# Patient Record
Sex: Female | Born: 1975
Health system: Southern US, Community
[De-identification: ages and names within clinical notes are randomized; demographics above are authoritative.]

## PROBLEM LIST (undated history)

## (undated) DIAGNOSIS — K649 Unspecified hemorrhoids: Secondary | ICD-10-CM

## (undated) DIAGNOSIS — D6859 Other primary thrombophilia: Secondary | ICD-10-CM

## (undated) DIAGNOSIS — I493 Ventricular premature depolarization: Secondary | ICD-10-CM

## (undated) DIAGNOSIS — I341 Nonrheumatic mitral (valve) prolapse: Secondary | ICD-10-CM

## (undated) DIAGNOSIS — I2699 Other pulmonary embolism without acute cor pulmonale: Secondary | ICD-10-CM

## (undated) DIAGNOSIS — D759 Disease of blood and blood-forming organs, unspecified: Secondary | ICD-10-CM

## (undated) DIAGNOSIS — F419 Anxiety disorder, unspecified: Secondary | ICD-10-CM

## (undated) DIAGNOSIS — E119 Type 2 diabetes mellitus without complications: Secondary | ICD-10-CM

## (undated) DIAGNOSIS — F32A Depression, unspecified: Secondary | ICD-10-CM

## (undated) DIAGNOSIS — K589 Irritable bowel syndrome without diarrhea: Secondary | ICD-10-CM

## (undated) DIAGNOSIS — R51 Headache: Secondary | ICD-10-CM

## (undated) DIAGNOSIS — K219 Gastro-esophageal reflux disease without esophagitis: Secondary | ICD-10-CM

## (undated) DIAGNOSIS — E7800 Pure hypercholesterolemia, unspecified: Secondary | ICD-10-CM

## (undated) DIAGNOSIS — M1990 Unspecified osteoarthritis, unspecified site: Secondary | ICD-10-CM

## (undated) DIAGNOSIS — E785 Hyperlipidemia, unspecified: Secondary | ICD-10-CM

## (undated) DIAGNOSIS — I10 Essential (primary) hypertension: Secondary | ICD-10-CM

## (undated) DIAGNOSIS — E039 Hypothyroidism, unspecified: Secondary | ICD-10-CM

## (undated) DIAGNOSIS — F329 Major depressive disorder, single episode, unspecified: Secondary | ICD-10-CM

## (undated) DIAGNOSIS — Z9189 Other specified personal risk factors, not elsewhere classified: Secondary | ICD-10-CM

## (undated) DIAGNOSIS — Z86711 Personal history of pulmonary embolism: Secondary | ICD-10-CM

## (undated) HISTORY — DX: Other pulmonary embolism without acute cor pulmonale: I26.99

## (undated) HISTORY — DX: Other specified personal risk factors, not elsewhere classified: Z91.89

## (undated) HISTORY — DX: Ventricular premature depolarization: I49.3

## (undated) HISTORY — DX: Personal history of pulmonary embolism: Z86.711

## (undated) HISTORY — DX: Pure hypercholesterolemia, unspecified: E78.00

## (undated) HISTORY — DX: Type 2 diabetes mellitus without complications: E11.9

## (undated) HISTORY — DX: Irritable bowel syndrome, unspecified: K58.9

## (undated) HISTORY — DX: Unspecified hemorrhoids: K64.9

## (undated) HISTORY — PX: GYNECOLOGIC CRYOSURGERY: SHX857

## (undated) HISTORY — DX: Gastro-esophageal reflux disease without esophagitis: K21.9

---
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
## 1997-06-20 ENCOUNTER — Encounter: Admission: RE | Admit: 1997-06-20 | Discharge: 1997-07-18 | Payer: Self-pay | Admitting: Obstetrics and Gynecology

## 1997-09-02 ENCOUNTER — Encounter (HOSPITAL_COMMUNITY): Admission: RE | Admit: 1997-09-02 | Discharge: 1997-12-01 | Payer: Self-pay | Admitting: Obstetrics and Gynecology

## 1997-12-02 ENCOUNTER — Encounter (HOSPITAL_COMMUNITY): Admission: RE | Admit: 1997-12-02 | Discharge: 1998-03-02 | Payer: Self-pay | Admitting: *Deleted

## 1997-12-20 ENCOUNTER — Emergency Department (HOSPITAL_COMMUNITY): Admission: EM | Admit: 1997-12-20 | Discharge: 1997-12-20 | Payer: Self-pay | Admitting: Emergency Medicine

## 1998-01-30 ENCOUNTER — Other Ambulatory Visit: Admission: RE | Admit: 1998-01-30 | Discharge: 1998-01-30 | Payer: Self-pay | Admitting: Obstetrics and Gynecology

## 1998-03-09 ENCOUNTER — Encounter (HOSPITAL_COMMUNITY): Admission: RE | Admit: 1998-03-09 | Discharge: 1998-06-07 | Payer: Self-pay | Admitting: *Deleted

## 1998-04-03 ENCOUNTER — Ambulatory Visit (HOSPITAL_COMMUNITY)
Admission: RE | Admit: 1998-04-03 | Discharge: 1998-04-03 | Payer: Self-pay | Admitting: Physical Medicine and Rehabilitation

## 1998-04-03 ENCOUNTER — Encounter: Payer: Self-pay | Admitting: Physical Medicine and Rehabilitation

## 1999-02-23 ENCOUNTER — Ambulatory Visit (HOSPITAL_BASED_OUTPATIENT_CLINIC_OR_DEPARTMENT_OTHER): Admission: RE | Admit: 1999-02-23 | Discharge: 1999-02-23 | Payer: Self-pay | Admitting: General Surgery

## 1999-04-03 ENCOUNTER — Ambulatory Visit (HOSPITAL_COMMUNITY)
Admission: RE | Admit: 1999-04-03 | Discharge: 1999-04-03 | Payer: Self-pay | Admitting: Physical Medicine and Rehabilitation

## 1999-04-03 ENCOUNTER — Encounter: Payer: Self-pay | Admitting: Physical Medicine and Rehabilitation

## 1999-05-07 ENCOUNTER — Inpatient Hospital Stay (HOSPITAL_COMMUNITY): Admission: RE | Admit: 1999-05-07 | Discharge: 1999-05-09 | Payer: Self-pay | Admitting: Gynecology

## 1999-05-21 HISTORY — PX: OTHER SURGICAL HISTORY: SHX169

## 1999-10-23 ENCOUNTER — Inpatient Hospital Stay (HOSPITAL_COMMUNITY): Admission: AD | Admit: 1999-10-23 | Discharge: 1999-10-29 | Payer: Self-pay | Admitting: Specialist

## 1999-10-30 ENCOUNTER — Ambulatory Visit (HOSPITAL_COMMUNITY)
Admission: RE | Admit: 1999-10-30 | Discharge: 1999-10-30 | Payer: Self-pay | Admitting: Physical Medicine and Rehabilitation

## 1999-10-30 ENCOUNTER — Encounter: Payer: Self-pay | Admitting: Physical Medicine and Rehabilitation

## 2000-08-19 ENCOUNTER — Other Ambulatory Visit: Admission: RE | Admit: 2000-08-19 | Discharge: 2000-08-19 | Payer: Self-pay | Admitting: Gynecology

## 2001-01-02 ENCOUNTER — Encounter: Admission: RE | Admit: 2001-01-02 | Discharge: 2001-04-02 | Payer: Self-pay | Admitting: Endocrinology

## 2001-03-20 ENCOUNTER — Emergency Department (HOSPITAL_COMMUNITY): Admission: EM | Admit: 2001-03-20 | Discharge: 2001-03-20 | Payer: Self-pay | Admitting: Emergency Medicine

## 2001-04-13 ENCOUNTER — Encounter: Admission: RE | Admit: 2001-04-13 | Discharge: 2001-04-13 | Payer: Self-pay | Admitting: Endocrinology

## 2001-04-13 ENCOUNTER — Encounter: Payer: Self-pay | Admitting: Endocrinology

## 2001-05-03 ENCOUNTER — Emergency Department (HOSPITAL_COMMUNITY): Admission: EM | Admit: 2001-05-03 | Discharge: 2001-05-03 | Payer: Self-pay

## 2001-05-04 ENCOUNTER — Other Ambulatory Visit: Admission: RE | Admit: 2001-05-04 | Discharge: 2001-05-04 | Payer: Self-pay | Admitting: Gynecology

## 2001-05-19 ENCOUNTER — Encounter: Admission: RE | Admit: 2001-05-19 | Discharge: 2001-08-17 | Payer: Self-pay | Admitting: Endocrinology

## 2001-05-20 DIAGNOSIS — D6859 Other primary thrombophilia: Secondary | ICD-10-CM | POA: Insufficient documentation

## 2001-05-20 HISTORY — DX: Other primary thrombophilia: D68.59

## 2001-08-11 ENCOUNTER — Encounter: Admission: RE | Admit: 2001-08-11 | Discharge: 2001-11-09 | Payer: Self-pay | Admitting: Endocrinology

## 2001-10-16 ENCOUNTER — Ambulatory Visit (HOSPITAL_BASED_OUTPATIENT_CLINIC_OR_DEPARTMENT_OTHER): Admission: RE | Admit: 2001-10-16 | Discharge: 2001-10-16 | Payer: Self-pay | Admitting: General Surgery

## 2001-12-23 ENCOUNTER — Encounter: Admission: RE | Admit: 2001-12-23 | Discharge: 2002-03-23 | Payer: Self-pay | Admitting: Endocrinology

## 2002-03-31 ENCOUNTER — Encounter: Payer: Self-pay | Admitting: Endocrinology

## 2002-03-31 ENCOUNTER — Inpatient Hospital Stay (HOSPITAL_COMMUNITY): Admission: EM | Admit: 2002-03-31 | Discharge: 2002-04-08 | Payer: Self-pay | Admitting: Emergency Medicine

## 2002-04-06 ENCOUNTER — Encounter: Payer: Self-pay | Admitting: Cardiology

## 2002-05-25 ENCOUNTER — Other Ambulatory Visit: Admission: RE | Admit: 2002-05-25 | Discharge: 2002-05-25 | Payer: Self-pay | Admitting: Gynecology

## 2002-06-02 ENCOUNTER — Emergency Department (HOSPITAL_COMMUNITY): Admission: EM | Admit: 2002-06-02 | Discharge: 2002-06-02 | Payer: Self-pay | Admitting: Emergency Medicine

## 2002-06-02 ENCOUNTER — Encounter: Payer: Self-pay | Admitting: Emergency Medicine

## 2002-07-05 ENCOUNTER — Emergency Department (HOSPITAL_COMMUNITY): Admission: EM | Admit: 2002-07-05 | Discharge: 2002-07-05 | Payer: Self-pay | Admitting: Emergency Medicine

## 2002-07-05 ENCOUNTER — Encounter: Payer: Self-pay | Admitting: Emergency Medicine

## 2002-07-06 ENCOUNTER — Ambulatory Visit (HOSPITAL_COMMUNITY): Admission: RE | Admit: 2002-07-06 | Discharge: 2002-07-06 | Payer: Self-pay | Admitting: Endocrinology

## 2003-01-10 ENCOUNTER — Ambulatory Visit (HOSPITAL_COMMUNITY): Admission: RE | Admit: 2003-01-10 | Discharge: 2003-01-10 | Payer: Self-pay | Admitting: Internal Medicine

## 2003-01-13 ENCOUNTER — Encounter: Payer: Self-pay | Admitting: Internal Medicine

## 2003-01-13 ENCOUNTER — Ambulatory Visit (HOSPITAL_COMMUNITY): Admission: RE | Admit: 2003-01-13 | Discharge: 2003-01-13 | Payer: Self-pay | Admitting: Internal Medicine

## 2003-05-26 ENCOUNTER — Other Ambulatory Visit: Admission: RE | Admit: 2003-05-26 | Discharge: 2003-05-26 | Payer: Self-pay | Admitting: Obstetrics and Gynecology

## 2003-11-23 ENCOUNTER — Other Ambulatory Visit: Admission: RE | Admit: 2003-11-23 | Discharge: 2003-11-23 | Payer: Self-pay | Admitting: Obstetrics and Gynecology

## 2004-02-17 ENCOUNTER — Other Ambulatory Visit: Admission: RE | Admit: 2004-02-17 | Discharge: 2004-02-17 | Payer: Self-pay | Admitting: Obstetrics and Gynecology

## 2004-05-07 ENCOUNTER — Other Ambulatory Visit: Admission: RE | Admit: 2004-05-07 | Discharge: 2004-05-07 | Payer: Self-pay | Admitting: Obstetrics and Gynecology

## 2004-05-15 ENCOUNTER — Inpatient Hospital Stay (HOSPITAL_COMMUNITY): Admission: AD | Admit: 2004-05-15 | Discharge: 2004-05-15 | Payer: Self-pay | Admitting: Obstetrics and Gynecology

## 2004-05-19 ENCOUNTER — Inpatient Hospital Stay (HOSPITAL_COMMUNITY): Admission: AD | Admit: 2004-05-19 | Discharge: 2004-05-19 | Payer: Self-pay | Admitting: Obstetrics and Gynecology

## 2004-06-15 ENCOUNTER — Ambulatory Visit: Payer: Self-pay | Admitting: Critical Care Medicine

## 2004-06-26 ENCOUNTER — Inpatient Hospital Stay (HOSPITAL_COMMUNITY): Admission: AD | Admit: 2004-06-26 | Discharge: 2004-06-26 | Payer: Self-pay | Admitting: Obstetrics and Gynecology

## 2004-07-16 ENCOUNTER — Ambulatory Visit: Payer: Self-pay | Admitting: Critical Care Medicine

## 2004-08-13 ENCOUNTER — Inpatient Hospital Stay (HOSPITAL_COMMUNITY): Admission: AD | Admit: 2004-08-13 | Discharge: 2004-08-13 | Payer: Self-pay | Admitting: Obstetrics and Gynecology

## 2004-09-03 ENCOUNTER — Inpatient Hospital Stay (HOSPITAL_COMMUNITY): Admission: AD | Admit: 2004-09-03 | Discharge: 2004-09-03 | Payer: Self-pay | Admitting: Obstetrics and Gynecology

## 2004-09-05 ENCOUNTER — Ambulatory Visit: Payer: Self-pay | Admitting: Critical Care Medicine

## 2004-09-06 ENCOUNTER — Ambulatory Visit: Payer: Self-pay | Admitting: Hematology & Oncology

## 2004-09-22 ENCOUNTER — Inpatient Hospital Stay (HOSPITAL_COMMUNITY): Admission: AD | Admit: 2004-09-22 | Discharge: 2004-09-23 | Payer: Self-pay | Admitting: Obstetrics and Gynecology

## 2004-10-13 ENCOUNTER — Inpatient Hospital Stay (HOSPITAL_COMMUNITY): Admission: AD | Admit: 2004-10-13 | Discharge: 2004-10-13 | Payer: Self-pay | Admitting: Obstetrics and Gynecology

## 2004-10-18 ENCOUNTER — Inpatient Hospital Stay (HOSPITAL_COMMUNITY): Admission: AD | Admit: 2004-10-18 | Discharge: 2004-10-22 | Payer: Self-pay | Admitting: Obstetrics and Gynecology

## 2004-10-23 ENCOUNTER — Inpatient Hospital Stay (HOSPITAL_COMMUNITY): Admission: AD | Admit: 2004-10-23 | Discharge: 2004-10-23 | Payer: Self-pay | Admitting: Obstetrics and Gynecology

## 2004-10-27 ENCOUNTER — Inpatient Hospital Stay (HOSPITAL_COMMUNITY): Admission: AD | Admit: 2004-10-27 | Discharge: 2004-11-09 | Payer: Self-pay | Admitting: Obstetrics and Gynecology

## 2004-11-04 ENCOUNTER — Ambulatory Visit: Payer: Self-pay | Admitting: Neonatology

## 2004-11-06 ENCOUNTER — Encounter (INDEPENDENT_AMBULATORY_CARE_PROVIDER_SITE_OTHER): Payer: Self-pay | Admitting: *Deleted

## 2004-11-10 ENCOUNTER — Inpatient Hospital Stay (HOSPITAL_COMMUNITY): Admission: AD | Admit: 2004-11-10 | Discharge: 2004-11-10 | Payer: Self-pay | Admitting: Obstetrics and Gynecology

## 2004-11-18 ENCOUNTER — Inpatient Hospital Stay (HOSPITAL_COMMUNITY): Admission: AD | Admit: 2004-11-18 | Discharge: 2004-11-18 | Payer: Self-pay | Admitting: Obstetrics & Gynecology

## 2004-12-14 ENCOUNTER — Other Ambulatory Visit: Admission: RE | Admit: 2004-12-14 | Discharge: 2004-12-14 | Payer: Self-pay | Admitting: Obstetrics and Gynecology

## 2004-12-20 ENCOUNTER — Ambulatory Visit: Admission: RE | Admit: 2004-12-20 | Discharge: 2004-12-20 | Payer: Self-pay | Admitting: Obstetrics and Gynecology

## 2005-03-27 ENCOUNTER — Ambulatory Visit: Payer: Self-pay | Admitting: Critical Care Medicine

## 2005-04-02 ENCOUNTER — Ambulatory Visit: Payer: Self-pay | Admitting: Internal Medicine

## 2005-04-02 ENCOUNTER — Ambulatory Visit: Payer: Self-pay

## 2005-04-15 ENCOUNTER — Ambulatory Visit: Payer: Self-pay | Admitting: Internal Medicine

## 2005-04-19 ENCOUNTER — Ambulatory Visit (HOSPITAL_COMMUNITY): Admission: RE | Admit: 2005-04-19 | Discharge: 2005-04-19 | Payer: Self-pay | Admitting: Internal Medicine

## 2005-04-19 ENCOUNTER — Encounter (INDEPENDENT_AMBULATORY_CARE_PROVIDER_SITE_OTHER): Payer: Self-pay | Admitting: *Deleted

## 2005-06-24 ENCOUNTER — Other Ambulatory Visit: Admission: RE | Admit: 2005-06-24 | Discharge: 2005-06-24 | Payer: Self-pay | Admitting: Obstetrics and Gynecology

## 2005-07-12 ENCOUNTER — Ambulatory Visit (HOSPITAL_COMMUNITY): Admission: RE | Admit: 2005-07-12 | Discharge: 2005-07-12 | Payer: Self-pay | Admitting: Internal Medicine

## 2005-11-10 ENCOUNTER — Emergency Department (HOSPITAL_COMMUNITY): Admission: EM | Admit: 2005-11-10 | Discharge: 2005-11-10 | Payer: Self-pay | Admitting: Emergency Medicine

## 2005-12-01 ENCOUNTER — Encounter (INDEPENDENT_AMBULATORY_CARE_PROVIDER_SITE_OTHER): Payer: Self-pay | Admitting: *Deleted

## 2005-12-01 ENCOUNTER — Ambulatory Visit (HOSPITAL_COMMUNITY): Admission: EM | Admit: 2005-12-01 | Discharge: 2005-12-01 | Payer: Self-pay | Admitting: Emergency Medicine

## 2006-02-28 ENCOUNTER — Emergency Department (HOSPITAL_COMMUNITY): Admission: EM | Admit: 2006-02-28 | Discharge: 2006-03-01 | Payer: Self-pay | Admitting: Emergency Medicine

## 2006-09-30 ENCOUNTER — Ambulatory Visit: Payer: Self-pay | Admitting: Internal Medicine

## 2006-10-08 ENCOUNTER — Ambulatory Visit: Payer: Self-pay | Admitting: Gastroenterology

## 2006-10-08 ENCOUNTER — Encounter (INDEPENDENT_AMBULATORY_CARE_PROVIDER_SITE_OTHER): Payer: Self-pay | Admitting: *Deleted

## 2006-10-15 ENCOUNTER — Ambulatory Visit: Payer: Self-pay | Admitting: Internal Medicine

## 2006-11-14 ENCOUNTER — Ambulatory Visit: Payer: Self-pay | Admitting: Internal Medicine

## 2007-05-12 ENCOUNTER — Ambulatory Visit: Payer: Self-pay | Admitting: Licensed Clinical Social Worker

## 2007-05-26 ENCOUNTER — Ambulatory Visit: Payer: Self-pay | Admitting: Licensed Clinical Social Worker

## 2007-06-05 ENCOUNTER — Ambulatory Visit: Payer: Self-pay | Admitting: Licensed Clinical Social Worker

## 2007-06-19 ENCOUNTER — Ambulatory Visit: Payer: Self-pay | Admitting: Licensed Clinical Social Worker

## 2007-06-30 ENCOUNTER — Ambulatory Visit: Payer: Self-pay

## 2007-07-10 ENCOUNTER — Ambulatory Visit: Payer: Self-pay | Admitting: Licensed Clinical Social Worker

## 2007-07-21 ENCOUNTER — Emergency Department (HOSPITAL_COMMUNITY): Admission: EM | Admit: 2007-07-21 | Discharge: 2007-07-21 | Payer: Self-pay | Admitting: Family Medicine

## 2007-07-22 ENCOUNTER — Encounter: Admission: RE | Admit: 2007-07-22 | Discharge: 2007-10-20 | Payer: Self-pay | Admitting: Internal Medicine

## 2007-07-24 ENCOUNTER — Ambulatory Visit: Payer: Self-pay | Admitting: Licensed Clinical Social Worker

## 2007-08-21 ENCOUNTER — Ambulatory Visit: Payer: Self-pay | Admitting: Licensed Clinical Social Worker

## 2007-08-28 ENCOUNTER — Ambulatory Visit: Payer: Self-pay | Admitting: Licensed Clinical Social Worker

## 2007-09-04 ENCOUNTER — Ambulatory Visit: Payer: Self-pay | Admitting: Licensed Clinical Social Worker

## 2007-09-09 ENCOUNTER — Emergency Department (HOSPITAL_COMMUNITY): Admission: EM | Admit: 2007-09-09 | Discharge: 2007-09-09 | Payer: Self-pay | Admitting: Emergency Medicine

## 2007-09-14 DIAGNOSIS — Z9089 Acquired absence of other organs: Secondary | ICD-10-CM | POA: Insufficient documentation

## 2007-09-14 DIAGNOSIS — K219 Gastro-esophageal reflux disease without esophagitis: Secondary | ICD-10-CM | POA: Insufficient documentation

## 2007-09-14 DIAGNOSIS — E039 Hypothyroidism, unspecified: Secondary | ICD-10-CM | POA: Insufficient documentation

## 2007-09-14 DIAGNOSIS — K589 Irritable bowel syndrome without diarrhea: Secondary | ICD-10-CM | POA: Insufficient documentation

## 2007-09-18 ENCOUNTER — Ambulatory Visit: Payer: Self-pay | Admitting: Licensed Clinical Social Worker

## 2007-09-25 ENCOUNTER — Ambulatory Visit: Payer: Self-pay | Admitting: Licensed Clinical Social Worker

## 2007-10-09 ENCOUNTER — Ambulatory Visit: Payer: Self-pay | Admitting: Licensed Clinical Social Worker

## 2007-10-13 ENCOUNTER — Ambulatory Visit: Payer: Self-pay | Admitting: Licensed Clinical Social Worker

## 2007-10-20 ENCOUNTER — Ambulatory Visit: Payer: Self-pay | Admitting: Licensed Clinical Social Worker

## 2007-10-27 ENCOUNTER — Ambulatory Visit: Payer: Self-pay | Admitting: Licensed Clinical Social Worker

## 2007-11-10 ENCOUNTER — Ambulatory Visit: Payer: Self-pay | Admitting: Licensed Clinical Social Worker

## 2007-11-17 ENCOUNTER — Ambulatory Visit: Payer: Self-pay | Admitting: Licensed Clinical Social Worker

## 2007-11-24 ENCOUNTER — Ambulatory Visit: Payer: Self-pay | Admitting: Licensed Clinical Social Worker

## 2007-11-30 ENCOUNTER — Encounter: Admission: RE | Admit: 2007-11-30 | Discharge: 2007-11-30 | Payer: Self-pay | Admitting: Internal Medicine

## 2007-11-30 ENCOUNTER — Encounter (INDEPENDENT_AMBULATORY_CARE_PROVIDER_SITE_OTHER): Payer: Self-pay | Admitting: *Deleted

## 2007-12-04 ENCOUNTER — Ambulatory Visit: Payer: Self-pay | Admitting: Licensed Clinical Social Worker

## 2007-12-11 ENCOUNTER — Ambulatory Visit: Payer: Self-pay | Admitting: Licensed Clinical Social Worker

## 2007-12-22 ENCOUNTER — Ambulatory Visit: Payer: Self-pay | Admitting: Licensed Clinical Social Worker

## 2007-12-29 ENCOUNTER — Ambulatory Visit: Payer: Self-pay | Admitting: Licensed Clinical Social Worker

## 2008-01-08 ENCOUNTER — Ambulatory Visit: Payer: Self-pay | Admitting: Licensed Clinical Social Worker

## 2008-01-19 ENCOUNTER — Ambulatory Visit: Payer: Self-pay | Admitting: Licensed Clinical Social Worker

## 2008-01-26 ENCOUNTER — Ambulatory Visit: Payer: Self-pay | Admitting: Licensed Clinical Social Worker

## 2008-02-02 ENCOUNTER — Ambulatory Visit: Payer: Self-pay | Admitting: Licensed Clinical Social Worker

## 2008-02-09 ENCOUNTER — Ambulatory Visit: Payer: Self-pay | Admitting: Licensed Clinical Social Worker

## 2008-03-11 ENCOUNTER — Ambulatory Visit: Payer: Self-pay | Admitting: Licensed Clinical Social Worker

## 2008-04-05 ENCOUNTER — Ambulatory Visit: Payer: Self-pay | Admitting: Licensed Clinical Social Worker

## 2008-04-22 ENCOUNTER — Ambulatory Visit: Payer: Self-pay | Admitting: Licensed Clinical Social Worker

## 2008-05-15 ENCOUNTER — Emergency Department (HOSPITAL_COMMUNITY): Admission: EM | Admit: 2008-05-15 | Discharge: 2008-05-15 | Payer: Self-pay | Admitting: Family Medicine

## 2008-05-27 ENCOUNTER — Ambulatory Visit: Payer: Self-pay | Admitting: Licensed Clinical Social Worker

## 2008-07-01 ENCOUNTER — Ambulatory Visit: Payer: Self-pay | Admitting: Licensed Clinical Social Worker

## 2008-07-08 ENCOUNTER — Ambulatory Visit: Payer: Self-pay | Admitting: Licensed Clinical Social Worker

## 2008-11-11 ENCOUNTER — Ambulatory Visit (HOSPITAL_COMMUNITY): Admission: RE | Admit: 2008-11-11 | Discharge: 2008-11-11 | Payer: Self-pay | Admitting: Emergency Medicine

## 2008-11-11 ENCOUNTER — Encounter (INDEPENDENT_AMBULATORY_CARE_PROVIDER_SITE_OTHER): Payer: Self-pay | Admitting: *Deleted

## 2008-11-15 ENCOUNTER — Encounter: Admission: RE | Admit: 2008-11-15 | Discharge: 2008-11-15 | Payer: Self-pay | Admitting: Emergency Medicine

## 2008-11-23 ENCOUNTER — Encounter: Admission: RE | Admit: 2008-11-23 | Discharge: 2008-12-23 | Payer: Self-pay | Admitting: Occupational Medicine

## 2008-11-25 ENCOUNTER — Ambulatory Visit: Payer: Self-pay | Admitting: Internal Medicine

## 2008-11-25 DIAGNOSIS — B373 Candidiasis of vulva and vagina: Secondary | ICD-10-CM | POA: Insufficient documentation

## 2008-11-25 DIAGNOSIS — K644 Residual hemorrhoidal skin tags: Secondary | ICD-10-CM | POA: Insufficient documentation

## 2008-11-25 DIAGNOSIS — K625 Hemorrhage of anus and rectum: Secondary | ICD-10-CM | POA: Insufficient documentation

## 2008-11-25 DIAGNOSIS — R933 Abnormal findings on diagnostic imaging of other parts of digestive tract: Secondary | ICD-10-CM | POA: Insufficient documentation

## 2008-11-25 DIAGNOSIS — B3731 Acute candidiasis of vulva and vagina: Secondary | ICD-10-CM | POA: Insufficient documentation

## 2008-11-25 DIAGNOSIS — R1012 Left upper quadrant pain: Secondary | ICD-10-CM | POA: Insufficient documentation

## 2008-12-01 ENCOUNTER — Telehealth: Payer: Self-pay | Admitting: Internal Medicine

## 2008-12-06 ENCOUNTER — Encounter: Payer: Self-pay | Admitting: Internal Medicine

## 2008-12-29 ENCOUNTER — Encounter: Payer: Self-pay | Admitting: Internal Medicine

## 2009-02-22 ENCOUNTER — Emergency Department (HOSPITAL_COMMUNITY): Admission: EM | Admit: 2009-02-22 | Discharge: 2009-02-22 | Payer: Self-pay | Admitting: Family Medicine

## 2009-03-01 ENCOUNTER — Emergency Department (HOSPITAL_COMMUNITY): Admission: EM | Admit: 2009-03-01 | Discharge: 2009-03-01 | Payer: Self-pay | Admitting: Family Medicine

## 2009-03-03 ENCOUNTER — Emergency Department (HOSPITAL_BASED_OUTPATIENT_CLINIC_OR_DEPARTMENT_OTHER): Admission: EM | Admit: 2009-03-03 | Discharge: 2009-03-03 | Payer: Self-pay | Admitting: Emergency Medicine

## 2009-03-03 ENCOUNTER — Ambulatory Visit: Payer: Self-pay | Admitting: Diagnostic Radiology

## 2009-08-11 ENCOUNTER — Ambulatory Visit: Payer: Self-pay | Admitting: Licensed Clinical Social Worker

## 2009-08-18 ENCOUNTER — Ambulatory Visit: Payer: Self-pay | Admitting: Licensed Clinical Social Worker

## 2009-08-25 ENCOUNTER — Ambulatory Visit: Payer: Self-pay | Admitting: Licensed Clinical Social Worker

## 2009-08-30 ENCOUNTER — Ambulatory Visit: Payer: Self-pay | Admitting: Licensed Clinical Social Worker

## 2009-09-04 ENCOUNTER — Ambulatory Visit: Payer: Self-pay | Admitting: Licensed Clinical Social Worker

## 2009-09-22 ENCOUNTER — Ambulatory Visit: Payer: Self-pay | Admitting: Licensed Clinical Social Worker

## 2009-10-13 ENCOUNTER — Ambulatory Visit: Payer: Self-pay | Admitting: Licensed Clinical Social Worker

## 2009-10-23 ENCOUNTER — Ambulatory Visit: Payer: Self-pay | Admitting: Licensed Clinical Social Worker

## 2009-11-06 ENCOUNTER — Ambulatory Visit: Payer: Self-pay | Admitting: Licensed Clinical Social Worker

## 2009-11-13 ENCOUNTER — Ambulatory Visit: Payer: Self-pay | Admitting: Licensed Clinical Social Worker

## 2009-11-24 ENCOUNTER — Ambulatory Visit: Payer: Self-pay | Admitting: Licensed Clinical Social Worker

## 2009-12-04 ENCOUNTER — Ambulatory Visit: Payer: Self-pay | Admitting: Licensed Clinical Social Worker

## 2009-12-28 ENCOUNTER — Ambulatory Visit (HOSPITAL_COMMUNITY): Admission: RE | Admit: 2009-12-28 | Discharge: 2009-12-28 | Payer: Self-pay | Admitting: Orthopedic Surgery

## 2010-01-05 ENCOUNTER — Ambulatory Visit (HOSPITAL_COMMUNITY): Admission: RE | Admit: 2010-01-05 | Discharge: 2010-01-05 | Payer: Self-pay | Admitting: Sports Medicine

## 2010-01-16 ENCOUNTER — Encounter: Admission: RE | Admit: 2010-01-16 | Discharge: 2010-01-16 | Payer: Self-pay | Admitting: Sports Medicine

## 2010-01-19 ENCOUNTER — Ambulatory Visit: Payer: Self-pay | Admitting: Licensed Clinical Social Worker

## 2010-01-24 ENCOUNTER — Encounter
Admission: RE | Admit: 2010-01-24 | Discharge: 2010-03-07 | Payer: Self-pay | Source: Home / Self Care | Admitting: Sports Medicine

## 2010-01-29 ENCOUNTER — Ambulatory Visit: Payer: Self-pay | Admitting: Licensed Clinical Social Worker

## 2010-02-12 ENCOUNTER — Ambulatory Visit: Payer: Self-pay | Admitting: Licensed Clinical Social Worker

## 2010-02-19 ENCOUNTER — Ambulatory Visit: Payer: Self-pay | Admitting: Licensed Clinical Social Worker

## 2010-03-05 ENCOUNTER — Ambulatory Visit: Payer: Self-pay | Admitting: Licensed Clinical Social Worker

## 2010-03-19 ENCOUNTER — Ambulatory Visit: Payer: Self-pay | Admitting: Licensed Clinical Social Worker

## 2010-03-26 ENCOUNTER — Ambulatory Visit: Payer: Self-pay | Admitting: Licensed Clinical Social Worker

## 2010-04-09 ENCOUNTER — Ambulatory Visit: Payer: Self-pay | Admitting: Licensed Clinical Social Worker

## 2010-04-11 ENCOUNTER — Ambulatory Visit: Payer: Self-pay | Admitting: Internal Medicine

## 2010-04-11 DIAGNOSIS — K602 Anal fissure, unspecified: Secondary | ICD-10-CM | POA: Insufficient documentation

## 2010-04-16 ENCOUNTER — Ambulatory Visit: Payer: Self-pay | Admitting: Licensed Clinical Social Worker

## 2010-04-23 ENCOUNTER — Ambulatory Visit: Payer: Self-pay | Admitting: Licensed Clinical Social Worker

## 2010-04-26 ENCOUNTER — Telehealth: Payer: Self-pay | Admitting: Internal Medicine

## 2010-04-30 ENCOUNTER — Ambulatory Visit: Payer: Self-pay | Admitting: Licensed Clinical Social Worker

## 2010-05-07 ENCOUNTER — Ambulatory Visit: Payer: Self-pay | Admitting: Licensed Clinical Social Worker

## 2010-05-20 LAB — HM MAMMOGRAPHY: HM Mammogram: NORMAL

## 2010-05-22 ENCOUNTER — Ambulatory Visit
Admission: RE | Admit: 2010-05-22 | Discharge: 2010-05-22 | Payer: Self-pay | Source: Home / Self Care | Attending: Licensed Clinical Social Worker | Admitting: Licensed Clinical Social Worker

## 2010-05-25 ENCOUNTER — Ambulatory Visit: Admit: 2010-05-25 | Payer: Self-pay | Admitting: Internal Medicine

## 2010-06-05 ENCOUNTER — Ambulatory Visit
Admission: RE | Admit: 2010-06-05 | Discharge: 2010-06-05 | Payer: Self-pay | Source: Home / Self Care | Attending: Licensed Clinical Social Worker | Admitting: Licensed Clinical Social Worker

## 2010-06-09 ENCOUNTER — Encounter: Payer: Self-pay | Admitting: Internal Medicine

## 2010-06-10 ENCOUNTER — Encounter: Payer: Self-pay | Admitting: Sports Medicine

## 2010-06-10 ENCOUNTER — Encounter: Payer: Self-pay | Admitting: Otolaryngology

## 2010-06-11 ENCOUNTER — Ambulatory Visit
Admission: RE | Admit: 2010-06-11 | Discharge: 2010-06-11 | Payer: Self-pay | Source: Home / Self Care | Attending: Licensed Clinical Social Worker | Admitting: Licensed Clinical Social Worker

## 2010-06-18 ENCOUNTER — Ambulatory Visit: Admit: 2010-06-18 | Payer: Self-pay | Admitting: Licensed Clinical Social Worker

## 2010-06-19 NOTE — Procedures (Signed)
Summary: Gastroenterology EGD  Gastroenterology EGD   Imported By: Lowry Ram CMA 09/15/2007 12:13:21  _____________________________________________________________________  External Attachment:    Type:   Image     Comment:   External Document

## 2010-06-19 NOTE — Progress Notes (Signed)
Summary: Lab Results   Phone Note Call from Patient Call back at Home Phone [phone]   Caller: Patient Call For: Yancey Flemings Reason for Call: Talk to Nurse Details for Reason: Lab Results Summary of Call: Pt wanting lab results from last Friday Initial call taken by: Darryl Lent CMA Duncan Dull),  December 01, 2008 10:47 AM  Follow-up for Phone Call         Pt.informed lab test was normal.Will keep f/u appt. Follow-up by: Teryl Lucy RN,  December 01, 2008 11:43 AM

## 2010-06-19 NOTE — Progress Notes (Signed)
Summary: Triage / BLOATING   Phone Note Call from Patient Call back at Home Phone [phone]   Caller: Patient Call For: Dr. [name] Reason for Call: Talk to Nurse Summary of Call: Pt is having some issues she wants to discuss with nurse, she is using creme and drinking metamucil but she is "SOOO BLOATED she looks 6 months pregnant", could metamucil cause bloating or is there anything she can take or not eat to help with the extreme bloating Initial call taken by: [place] Johnson,  April 26, 2010 11:00 AM  Follow-up for Phone Call        Spoke with patient, she c/o lots of bloating. Suggested she try Benefiber instead of the Metamucil. Also states she is having a lot of gas, instucted her to try gas-x or phazyme 30 min prior to meals. Mailed patient a low gas diet. Instructed patient to call back if this does not improve. Follow-up by: Selinda Michaels RN,  April 26, 2010 11:51 AM  Additional Follow-up for Phone Call Additional follow up Details #1::        OK Additional Follow-up by: Hilarie Fredrickson MD,  April 26, 2010 12:06 PM

## 2010-06-19 NOTE — Letter (Signed)
Summary: Appointment Reminder  [facility]  [address] [place], [place] [address]   Phone: [phone]  Fax: [phone]        December 29, 2008 MRN: [id number]    [facility] [address] [place], [place]  [address]    Dear [name],  We see that you missed your appointment with Dr. [name] August 12th,   2010.  It is very important that we reach you to re-schedule your  appointment. We hope that you allow [place] to participate in your health   care needs. Please contact us at [phone] at your earliest   convenience to schedule your appointment.     Sincerely,    Yancey Flemings, MD Milford Cage NCMA
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
## 2010-06-19 NOTE — Procedures (Signed)
Summary: EGD/Martin Adin Hector MD  EGD/Martin Adin Hector MD   Imported By: Lester Farmersburg 04/19/2010 10:39:46  _____________________________________________________________________  External Attachment:    Type:   Image     Comment:   External Document

## 2010-06-19 NOTE — Procedures (Signed)
Summary: Gastroenterology COLON  Gastroenterology COLON   Imported By: Lowry Ram CMA 09/15/2007 11:59:05  _____________________________________________________________________  External Attachment:    Type:   Image     Comment:   External Document

## 2010-06-19 NOTE — Assessment & Plan Note (Signed)
Summary: FOLLOWUP CT SCAN / abdominal pain    History of Present Illness Visit Type: consult Primary GI MD: Yancey Flemings MD Primary Provider: Dr. Sherlyn Lees Requesting Provider: Dr. Lesle Chris Chief Complaint: [facility] f/u for CT. 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 She is sent today regarding recent episode of acute abdominal pain and abnormal abdominal imaging. Other issues that she presents with include rectal bleeding, rectal nodule, medication for irritable bowel, and vaginal yeast infection. She was last seen in this office in May of 2008 for nausea and multiple abdominal complaints. Complete colonoscopy with deep intubation of the ileum and upper endoscopy were normal. She was diagnosed with GERD and IBS. She was treated with Nexium and Librax. She was instructed to followup but failed to do so. Apparently, medication was helpful. She continues with chronic bloating, nausea, and alternating bowel habits. She is out of Librax and requests refill. Her recent history begin November 11, 2008 when she presented to urgent care with  5 days of progressive acute left-sided abdominal pain. The pain was described as severe and sharp. This was identical to an episode in 2007.She was treated with Cipro and Bentyl which did not help. She tells me that she had an elevated white count. CT Scan of the abdomen and pelvis performed that day. She was found to have segmental mild bowel wall thickening of the mid ileum. This was less severe than those changes seen on the CT scan in July of 2007(both CT scans reviewed). Since that time she has continued with abdominal fullness and left upper quadrant discomfort. Nausea is unchanged. No vomiting, change in bowel habits,  fever, hives, or lip swelling. Defecation does not affect the pain. Discomfort is worse with movement.No  weight loss. Next, she reports vaginal yeast infection after Cipro use and requests Diflucan (helped in the past). Also some bleeding and a nodule noted in her rectum. Ultram is not helping her abdominal pain.On no meds for GERD.   GI Review of Systems    Reports abdominal pain, bloating, and  nausea.     Location of  Abdominal pain: LUQ.    Denies acid reflux, belching, chest pain, dysphagia with liquids, dysphagia with solids, heartburn, loss of appetite, vomiting, vomiting blood, weight loss, and  weight gain.      Reports constipation, diarrhea, hemorrhoids, irritable bowel syndrome, and  rectal bleeding.     Denies anal fissure, black tarry stools, change in bowel habit, diverticulosis, fecal incontinence, heme positive stool, jaundice, light color stool, liver problems, and  rectal pain.    Current Medications (verified): 1)  Synthroid 150 Mcg Tabs (Levothyroxine Sodium) .... One Tablet By Mouth Once Daily 2)  Norvasc 5 Mg Tabs (Amlodipine Besylate) .... One Tablet By Mouth Once Daily 3)  Triamterene-Hctz 37.5-25 Mg Tabs (Triamterene-Hctz) .... One Tablet By Mouth Once Daily 4)  Zetia 10 Mg Tabs (Ezetimibe) .... One Tablet By Mouth Once Daily 5)  Klonopin 1 Mg Tabs (Clonazepam) .... One Tablet By Mouth As Needed 6)  Wellbutrin Xl 300 Mg Xr24h-Tab (Bupropion Hcl) .... One Tablet By Mouth Once Daily 7)  Crestor 10 Mg Tabs (Rosuvastatin Calcium) .... One Tablet By Mouth Once Daily 8)  Ortho Micronor 0.35 Mg Tabs (Norethindrone (Contraceptive)) .... One Tablet By Mouth At Bedtime  Allergies (verified):  No Known Drug Allergies  Past History:  Past Medical History: DEEP VEIN THROMBOSIS ,LOWER EXTR. 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  Alcohol Use - no Illicit Drug Use - no Patient does not get regular exercise.  Daily Caffeine Use: cup of coffee daily Smoking Status:  quit Drug Use:  no Does Patient Exercise:  no  Review of Systems       anxiety and depression, joint aches, headaches. All other systems reviewed and reported as negative  Vital Signs:  Patient profile:   35 year old female Height:      69 inches Weight:      224 pounds BMI:     33.20 BSA:     2.17 Pulse rate:   82 / minute Pulse rhythm:   regular BP sitting:   122 / 76  (right arm) Cuff size:   large  Vitals Entered By: Ok Anis CMA (November 25, 2008 11:32 AM)  Physical Exam  General:  concerned but otherwise Well developed, well nourished, no acute distress. Head:  Normocephalic and atraumatic. Eyes:  PERRLA, no icterus. Ears:  Normal auditory acuity. Nose:  No deformity, discharge,  or lesions. Mouth:  No deformity or lesions, dentition normal. Neck:  Supple; no masses or thyromegaly. Lungs:  Clear throughout to auscultation. Heart:  Regular rate and rhythm; no murmurs, rubs,  or bruits. Abdomen:  obese and soft with tenderness to palpation in the left upper quadrant. No organomegaly or mass. Bowel sounds heard. Rectal:  small external hemorrhoid. Probable anal papilla. Brown Hemoccult negative stool Genitalia:  obvious vaginal yeast Msk:  Symmetrical with no gross deformities. Normal posture. Pulses:  Normal pulses noted. Extremities:  No clubbing, cyanosis, edema or deformities noted. Neurologic:  Alert and  oriented x4;  grossly normal neurologically. Skin:  Intact without significant lesions or rashes. Cervical Nodes:  No significant cervical adenopathy.no supraclavicular adenopathy Psych:  Alert and cooperative. Normal mood and affect.   Impression & Recommendations:  Problem # 1:  ABDOMINAL PAIN-LUQ (ICD-789.02) Acute left upper  quadrant abdominal pain. CT Scan demonstrating mild abnormal thickening of the mid ileum. Similar problem in 2007 with CT scan demonstrating a bit more thickening of the distal ileum. Negative direct evaluation of the terminal ileum on colonoscopy in 2008 while asymptomatic (no acute pain). Initially felt to represent an infectious gastroenteritis. With recurrent problem, could be recurrent infection though this would be a bit unusual. Prior testing for celiac disease negative. This may represent angioedema of the gastrointestinal tract. I do not think this is likely inflammatory bowel disease or neoplasm, though they are in the differential.  PLAN: #1. C1 esterase inhibitor level #2. Prescribed Darvocet-N 100 for pain #3. Office followup in 4 weeks' #4. If C1 esterase inhibitor level is normal and the symptoms persist, consider CT enterography and/or capsule endoscopy. We discussed this today.  Problem # 2:  NONSPECIFIC ABN FINDING RAD & OTH EXAM GI TRACT (ICD-793.4) abnormal small bowel imaging on CT scan in 2007 and again in 2010. Differential and plan as outlined above.  Problem # 3:  IBS (ICD-564.1) chronic ongoing problems with irritable bowel syndrome. Symptoms worse since she has been off antispasmodic.  Plan: #1. Prescribe Librax one before meals p.r.n.  Problem # 4:  GERD (ICD-530.81) seems to be asymptomatic. One wonders if her  chronic nausea might not be related to GERD. Could consider a trial of proton pump inhibitor therapy. Will discuss with her followup.  Problem # 5:  RECTAL BLEEDING (ICD-569.3) rectal bleeding secondary to hemorrhoids.  Plan: #1. Analpram cream prescribed. Sample also given  Problem # 6:  HEMORRHOIDS-EXTERNAL (ICD-455.3) hemorrhoids with resultant bleeding. Benign nodule found to be anal papilla on exam. Add fiber in addition to medicated cream  Problem # 7:  CANDIDIASIS, VAGINAL (ICD-112.1) moderately severe vaginal yeast infection as a result of  ciprofloxacin. She has had this in the past. She has responded to Diflucan in the past.  Plan #1. Prescribed Diflucan 200 mg on day one and 100 mg daily for one week.  #2. use topical antifungal agent as well  Other Orders: T- * Misc. Laboratory test [id number])  Patient Instructions: 1)  Labs ordered for patient to have drawn today. 2)  Librax #100  x 11 RFS 3)  Diflucan #8 x 0 RFs 4)  DN100 #60 x 1 RF 5)  Analpram 2.5% cream x 6 RFS 6)  Please schedule a follow-up appointment in 4  weeks.  7)  The medication list was reviewed and reconciled.  All changed / newly prescribed medications were explained.  A complete medication list was provided to the patient / caregiver. 8)  Copy to: Dr. Larina Earthly, Dr. Lesle Chris Prescriptions: ANALPRAM E 2.5-1 & 1 % KIT (HYDROCORTISONE ACE-PRAMOXINE) apply to rectum as needed  #1box x 6   Entered by:   Milford Cage CMA   Authorized by:   Hilarie Fredrickson MD   Signed by:   Milford Cage CMA on 11/25/2008   Method used:   Electronically to        [facility]  [place] [address] (retail)       [address]       [place], [place]  [address]       Ph: [phone] or [phone]       Fax: [phone]   RxID:   [id number] DARVOCET-N 100 100-650 MG TABS (PROPOXYPHENE N-APAP) 1-2 by mouth q 4 hours as needed pain  #60 x 1   Entered by:   Milford Cage CMA   Authorized by:   Hilarie Fredrickson MD   Signed by:   Milford Cage CMA on 11/25/2008   Method used:   Print then Give to Patient   RxID:   [id number] DIFLUCAN 100 MG TABS (FLUCONAZOLE) 2 by mouth day one and then 1 by mouth once daily  #8 x 0   Entered by:   Milford Cage CMA   Authorized by:   Hilarie Fredrickson MD   Signed by:   Milford Cage CMA on 11/25/2008   Method used:   Electronically to        [facility]  [place] [address] (retail)       [address]       [place], [place]  [address]       Ph: [phone] or [phone]       Fax: [phone]   RxID:   [id number] LIBRAX 2.5-5 MG CAPS  (CLIDINIUM-CHLORDIAZEPOXIDE) 1 by mouth AC as needed  #100 x 11   Entered by:   Milford Cage CMA   Authorized by:   Hilarie Fredrickson MD   Signed by:   Milford Cage CMA on 11/25/2008   Method used:   Electronically to        [facility]  [place] [facility] [address] (retail)       [address]       [place], [place]  [address]       Ph: [phone] or [phone]       Fax: [phone]   RxID:   [id number]

## 2010-06-20 ENCOUNTER — Encounter: Payer: Self-pay | Admitting: Sports Medicine

## 2010-07-02 ENCOUNTER — Ambulatory Visit (INDEPENDENT_AMBULATORY_CARE_PROVIDER_SITE_OTHER): Payer: 59 | Admitting: Licensed Clinical Social Worker

## 2010-07-02 DIAGNOSIS — F3189 Other bipolar disorder: Secondary | ICD-10-CM

## 2010-07-09 ENCOUNTER — Ambulatory Visit: Payer: Self-pay | Admitting: Licensed Clinical Social Worker

## 2010-07-23 ENCOUNTER — Ambulatory Visit (INDEPENDENT_AMBULATORY_CARE_PROVIDER_SITE_OTHER): Payer: 59 | Admitting: Licensed Clinical Social Worker

## 2010-07-23 DIAGNOSIS — F3189 Other bipolar disorder: Secondary | ICD-10-CM

## 2010-07-30 ENCOUNTER — Ambulatory Visit (INDEPENDENT_AMBULATORY_CARE_PROVIDER_SITE_OTHER): Payer: 59 | Admitting: Licensed Clinical Social Worker

## 2010-07-30 DIAGNOSIS — F3189 Other bipolar disorder: Secondary | ICD-10-CM

## 2010-08-13 ENCOUNTER — Ambulatory Visit (INDEPENDENT_AMBULATORY_CARE_PROVIDER_SITE_OTHER): Payer: 59 | Admitting: Licensed Clinical Social Worker

## 2010-08-13 DIAGNOSIS — F3189 Other bipolar disorder: Secondary | ICD-10-CM

## 2010-08-23 LAB — PREGNANCY, URINE: Preg Test, Ur: NEGATIVE

## 2010-08-23 LAB — D-DIMER, QUANTITATIVE
D-Dimer, Quant: 0.42 ug/mL-FEU (ref 0.00–0.48)
D-Dimer, Quant: 0.56 ug/mL-FEU — ABNORMAL HIGH (ref 0.00–0.48)

## 2010-08-31 ENCOUNTER — Ambulatory Visit: Payer: 59 | Admitting: Licensed Clinical Social Worker

## 2010-09-03 ENCOUNTER — Ambulatory Visit: Payer: 59 | Admitting: Licensed Clinical Social Worker

## 2010-09-11 ENCOUNTER — Ambulatory Visit: Payer: 59 | Admitting: Licensed Clinical Social Worker

## 2010-09-17 ENCOUNTER — Ambulatory Visit (INDEPENDENT_AMBULATORY_CARE_PROVIDER_SITE_OTHER): Payer: 59 | Admitting: Licensed Clinical Social Worker

## 2010-09-17 DIAGNOSIS — F3189 Other bipolar disorder: Secondary | ICD-10-CM

## 2010-09-24 ENCOUNTER — Ambulatory Visit (INDEPENDENT_AMBULATORY_CARE_PROVIDER_SITE_OTHER): Payer: 59 | Admitting: Licensed Clinical Social Worker

## 2010-09-24 DIAGNOSIS — F3189 Other bipolar disorder: Secondary | ICD-10-CM

## 2010-10-02 NOTE — Assessment & Plan Note (Signed)
[name]                         GASTROENTEROLOGY OFFICE NOTE   Andrea, [name]                  MRN:          [id number]  DATE:11/14/2006                            DOB:          [birth year]    HISTORY:  Andrea [name] presents today for follow up. She is a 35 year old  with a history of hypothyroidism, gastroesophageal reflux disease,  irritable bowel syndrome, and remote deep vein thrombosis of the lower  extremities associated with pulmonary emboli. She was evaluated on  09/30/2006 for nausea and multiple abdominal complaints. She was felt to  have reflux disease and irritable bowel syndrome. She was placed on  Nexium 40 mg daily and scheduled for colonoscopy and upper endoscopy.  Colonoscopy including intubation of the terminal ileum was entirely  normal. A fiber was recommended, as was Anusol for hemorrhoids. Next,  upper endoscopy was entirely normal. She was continued on Nexium and  advised with regards to reflux precautions. She presents today for  follow up as requested. First, the patient reports complete resolution  of her reflux symptoms. No further problems with nausea or vomiting. No  heartburn. No dysphagia. She does continue with lower abdominal cramping  and urgency which is often exacerbated by meals. No other issues. She is  tolerating her medication well.   CURRENT MEDICATIONS:  1. Synthroid.  2. Birth control pill.  3. Multivitamin.  4. Vitamin C.  5. Crestor.  6. Nexium.   PHYSICAL EXAMINATION:  GENERAL:  Well appearing female in no acute  distress.  VITAL SIGNS: Blood pressure 122/70, heart rate 80, weight 229.4 pounds  (increased 6 pounds).  HEENT:  Sclerae anicteric.  ABDOMEN:  Soft without tenderness, mass, or hernia.   IMPRESSION:  1. Gastroesophageal reflux disease with negative endoscopy. Symptoms      controlled with once daily Nexium.  2. Irritable bowel syndrome.   RECOMMENDATIONS:  1. Continue  Nexium.  2. Reflux precautions with attention to weight loss.  3. Compliance with fiber supplementation.  4. Librax 1 p.o. b.i.d. p.r.n.  5. Exercise and weight loss.  6. GI follow up in 6-8 weeks.  7. Ongoing general medical care with Dr. Lenord [name].     [name]. [name], MD  Electronically Signed    JNP/MedQ  DD: 11/14/2006  DT: 11/15/2006  Job #: [id number]   cc:   Andrea [name]. Andrea [name], M.D.

## 2010-10-02 NOTE — Assessment & Plan Note (Signed)
[facility]                         GASTROENTEROLOGY OFFICE NOTE   Andrea [name], Andrea [name]                  MRN:          [id number]  DATE:09/30/2006                            DOB:          1975/05/21    REFERRING PHYSICIAN:  Luanna Cole. Lenord Fellers, M.D.   REASON FOR CONSULTATION:  Nausea and multiple abdominal complaints.   HISTORY:  This is a 35 year old, white female with a history of  hypothyroidism, gastroesophageal reflux disease, irritable bowel  syndrome, and remote deep venous thrombosis of the lower extremities  associated with pulmonary emboli. She is referred through the courtesy  of Dr. Lenord Fellers regarding persistent nausea and chronic abdominal  complaints. The patient was evaluated in this office on one occasion in  November 2006 regarding a hepatic abnormality on CT imaging. The patient  was found to have a small benign hemangioma as well as focal nodular  hyperplasia on MRI. She has not been seen since. I was contacted by  urgent care center in July 2007 regarding an abnormal CT scan that the  patient had under the direction of urgent care. She was having problems  with abdominal pain and diarrhea and was found to have thickening of the  ileum. Clinically she was felt to have an infectious enteritis and was  treated with Cipro and improved. She was not seen here. She has had  chronic irritable bowel complaints dating back 15 years. She apparently  underwent colonoscopy and upper endoscopy at least that long ago. At the  time of her last visit, we did test her for celiac disease. This was  negative. Currently she complains of chronic nausea. She vomits 2-3  times per week. Occasionally with food, occasionally without. She has  had intractable problems with heartburn and indigestion for which she  has taken Tums, Gas-X and over-the-counter agents. For the reasons that  are unclear, she is not on a proton pump inhibitor as previously  recommended. She denies dysphagia. She also reports ongoing problems  with increased intestinal gas, urgency, cramping. She has 3-4 bowel  movements per day. They are for the most part formed. She does have  occasional constipation. She also has intermittent problems with  hemorrhoids which she has treated with topical agents. She also mentions  intermittent problems with right scapular pain which sounds  musculoskeletal and that she has had in the past. No associated  shortness of breath, fever or other symptoms.   PAST MEDICAL HISTORY:  As above.   PAST SURGICAL HISTORY:  1. Cesarean section June 2006.  2. Tonsillectomy in 1993.   ALLERGIES:  No known drug allergies.   CURRENT MEDICATIONS:  1. Synthroid 0.125 mg daily.  2. Birth control pill.  3. Multivitamin.  4. Vitamin C.  5. Crestor unspecified dosage daily.   FAMILY HISTORY:  Father with colon polyps.   SOCIAL HISTORY:  The patient is married with 2 children. 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 She does not smoke or use alcohol.   PHYSICAL EXAMINATION:  GENERAL:  Well-appearing female in no acute  distress.  VITAL  SIGNS:  Blood pressure is 138/86, heart rate is 80, weight is 223  pounds (increased 6 pounds). She is 5 feet 9 inches in height.  HEENT:  Sclera are anicteric. Conjunctiva are pink. Oral mucosa is  intact. There is no aphthous ulcerations.  LUNGS:  Clear.  HEART:  Regular.  ABDOMEN:  Obese and soft without tenderness, mass or hernia. Good bowel  sounds heard.  EXTREMITIES:  Without edema.   IMPRESSION:  1. Chronic gastroesophageal reflux disease.  2. Chronic nausea with intermittent vomiting likely due to reflux      disease.  3. Chronic lower abdominal complaints manifested by cramping urgency      and frequent bowel movements. Most likely irritable bowel.  4. Prior history of acute gastrointestinal illness with abnormal CT      imaging as  described. Most likely self limited infectious      enteritis. However, given a background of chronic abdominal      complaints, need rule out inflammatory bowel disease.  5. Symptomatic hemorrhoids.   RECOMMENDATIONS:  1. Daily proton pump inhibitor therapy. Samples have been provided as      well as a prescription with multiple refills.  2. Schedule abdominal ultrasound regarding complaints of RUQ/ right      scapular pain.  3. Schedule colonoscopy to evaluate chronic lower gastrointestinal      complaints and abnormal CT imaging.  4. Schedule upper endoscopy to evaluate chronic nausea and severe      reflux symptoms.  5. If the patient is found to have irritable bowel as a cause for her      lower abdominal complaints, consider probiotics and/or      antispasmodics.     [name]. [name], MD  Electronically Signed    JNP/MedQ  DD: 09/30/2006  DT: 10/01/2006  Job #: [id number]   cc:   Luanna Cole. Lenord Fellers, M.D.

## 2010-10-05 ENCOUNTER — Ambulatory Visit (INDEPENDENT_AMBULATORY_CARE_PROVIDER_SITE_OTHER): Payer: 59 | Admitting: Licensed Clinical Social Worker

## 2010-10-05 DIAGNOSIS — F3189 Other bipolar disorder: Secondary | ICD-10-CM

## 2010-10-05 NOTE — Op Note (Signed)
Andrea [name], Andrea [name]           ACCOUNT NO.:  [id number]   MEDICAL RECORD NO.:  [id number]          PATIENT TYPE:  INP   LOCATION:  9122                          FACILITY:  [facility]   PHYSICIAN:  Michelle L. Grewal, M.D.DATE OF BIRTH:  [birth year]   DATE OF PROCEDURE:  11/06/2004  DATE OF DISCHARGE:                                 OPERATIVE REPORT   PREOPERATIVE DIAGNOSES:  1.  Intrauterine pregnancy at 36 weeks 4 days.  2.  Refuses labor.  3.  Preterm labor.  4.  Status post amniocentesis one week ago.   POSTOPERATIVE DIAGNOSES:  1.  Intrauterine pregnancy at 36 weeks 4 days.  2.  Refuses labor.  3.  Preterm labor.  4.  Status post amniocentesis one week ago.   PROCEDURE:  Primary low transverse cesarean section.   SURGEON:  Michelle L. Vincente Poli, M.D.   ANESTHESIA:  Spinal.   SPECIMENS:  A female infant, weight 8 pounds 2 ounces, Apgars 8 at one minute  and 9 at five minutes.   ESTIMATED BLOOD LOSS:  500 mL.   COMPLICATIONS:  None.   PROCEDURE:  The patient was taken to the operating room, where spinal was  placed without difficulty and she was prepped and draped in the usual  sterile fashion.  A Foley catheter was inserted into the bladder and was  draining clear urine.  A low transverse incision was made and carried down  to the fascia.  The fascia was scored in the midline and extended laterally.  The rectus muscles were separated in the midline and the peritoneum was  entered bluntly and the peritoneal incision was then stretched.  The bladder  blade was inserted and the lower uterine segment was identified and the  bladder flap was created sharply and then digitally.  The bladder blade was  then readjusted.  A low transverse incision was made and carried down to the  uterus.  The uterus was entered using a hemostat.  The amniotic fluid was  clear.  The baby was in cephalic presentation.  It was a female infant, Apgars  8 at one minute and 9 at five minutes, and weighed 8  pounds 2 ounces.  The  cord was clamped and cut, the baby was handed to the waiting neonatologist  and was then taken to the newborn nursery.  The placenta was manually  removed and noted to be normal and intact.  The uterus was exteriorized and  cleared of all clots and debris.  The uterine incision was closed in a  single layer using 0 chromic in a continuous locked stitch.  It was  hemostatic.  The uterus was returned to the abdomen.  Irrigation was  performed and hemostasis was again noted.  The peritoneum was closed using 0  Vicryl in a continuous running stitch, and the rectus muscles were  reapproximated using the same 0 Vicryl.  The fascia was  closed using 0 Vicryl in a continuous running stitch, starting at each  corner and meeting in the midline.  After irrigation of the subcutaneous  layers, the skin was closed with staples.  All sponge,  lap and instrument  counts were correct x2.  The patient went to the recovery room in stable  condition.       MLG/MEDQ  D:  11/06/2004  T:  11/06/2004  Job:  [id number]

## 2010-10-05 NOTE — Consult Note (Signed)
NAMECOLUMBIA, PANDEY                       ACCOUNT NO.:  [id number]   MEDICAL RECORD NO.:  [id number]                   PATIENT TYPE:  INP   LOCATION:  0353                                 FACILITY:  [facility]   PHYSICIAN:  Casimiro Needle B. Sherene Sires, M.D. [facility]           DATE OF BIRTH:  May 09, 1976   DATE OF CONSULTATION:  04/02/2002  DATE OF DISCHARGE:                                   CONSULTATION   REASON FOR CONSULTATION:  Pulmonary embolism.   HISTORY:  This is a 35 year old white female with a history of obesity and  history of smoking who was placed on birth control pills three weeks ago and  developed acute onset of dyspnea with a sensation of chest tightness, but  not definitely pleuritic pain on the 11th.  This progressed to the point  where she was short of breath at rest and underwent a CT scan at [facility] on November 12 and was found to have a lesion that was suspicious  for a clot in the right pulmonary artery and has been admitted yesterday and  placed on IV heparin.  She states she is still feeling uncomfortable, but  again, does not have any classic pleuritic pain.  She is not short of breath  at rest on oxygen.  She denies any significant cough, fevers, chills,  orthopnea, PND, or previous history of clotting or injury to her lower  extremities.   PAST MEDICAL HISTORY:  1. Hypothyroidism.  2. Hyperlipidemia.  3. Hypertension.  4. Obsessive compulsive disorder with depression.  5. She is status post tonsillectomy.  6. Has had a tubal pregnancy in the past requiring exploratory laparoscopic     procedures.  7. D&C.  8. History of miscarriages.   ALLERGIES:  None known.   MEDICATIONS:  Synthroid and birth control pills.   FAMILY HISTORY:  Positive for hyperlipidemia in her father.  Her mother is  well.  She has one maternal uncle who had a pulmonary embolism and is still  on Coumadin.   SOCIAL HISTORY:  She is married.  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  She does smoke a half pack to a pack per day, but does not use  alcohol.   REVIEW OF SYSTEMS:  Taken in detail from the patient and also from the  excellent H&P in the chart.  Negative except for the findings as above.   PHYSICAL EXAMINATION:  GENERAL:  This is a slightly anxious white female in  no acute distress.  VITAL SIGNS:  Temperature maximum 99.4, blood pressure 134/84 with pulse  rate around 70-80.  HEENT:  Unremarkable.  Pharynx was clear.  Nasal __________ normal.  Ear  canals are clear bilaterally.  LUNGS:  Fields revealed diminished breath sounds at the bases bilaterally  with no rub.  Overall air movement, however, was excellent except in the  bases.  CARDIAC:  There was  a regular rate and rhythm without murmur, gallop, rub,  or increase in pulmonic or S2.  ABDOMEN:  Soft and benign with no palpable organomegaly, masses, or  tenderness.  EXTREMITIES:  Warm without calf tenderness, cyanosis, clubbing, or edema.   LABORATORIES:  I do not have the CT scan itself but the report was reviewed  in detail as listed in the chart on November 12 and discussed above.  There  were no additional findings other than atelectasis in the bases.  On room  air she had a pH of 7.43, pCO2 of 37, pO2 of 85.  Factor V Leiden, protein,  C&S, and well as lupus anticoagulant are pending but an antithrombin 3 level  is normal.   IMPRESSION:  This is a clinical history that is very strongly suspicious for  pulmonary embolism in that there is an abrupt onset of dyspnea and vague  chest discomfort in a patient who is at high risk of pulmonary embolism  based on multiple risk factors and who has a definite abnormality in the  right pulmonary artery, although not definitely a diagnostic spiral CT.  Given the multiple risk factors and abnormality in the pulmonary artery that  probably represents an acute thrombus, I believe she needs to be treated  empirically for pulmonary embolism  with intravenous heparin for seven days  and with two to three days of overlap INR maintaining an INR between 2-3 for  the next six months.   After six months her risk factors needs to be reevaluated including the  results of a hypocoagulable profile that are pending at time of this  admission.   Should her symptoms not resolve over the next two to three days on  intravenous heparin, we might consider repeating the spiral CT scan but  given the history and the lack of an alternative explanation for her  symptoms of dyspnea and chest discomfort, although not classically  pleuritic, I believe we should assume this is a pulmonary embolism until  proven otherwise and treat her as above.  Pulmonary follow-up, however, as  an inpatient can be p.r.n.                                               Charlaine Dalton. Sherene Sires, M.D. [facility]    MBW/MEDQ  D:  04/02/2002  T:  04/02/2002  Job:  [id number]   cc:   Brooke Bonito, M.D.  [address]  [place]  [place] [address]  Fax: [phone]

## 2010-10-05 NOTE — H&P (Signed)
NAME:  Andrea [name], Andrea [name]                     ACCOUNT NO.:  [id number]   MEDICAL RECORD NO.:  [id number]                   PATIENT TYPE:  INP   LOCATION:  0102                                 FACILITY:  [facility]   PHYSICIAN:  Brooke Bonito, M.D.                   DATE OF BIRTH:  09-12-1975   DATE OF ADMISSION:  03/31/2002  DATE OF DISCHARGE:                                HISTORY & PHYSICAL   ROOM:  To be admitted to room 353.   CHIEF COMPLAINT:  Dyspnea and chest tightness.   HISTORY OF PRESENT ILLNESS:  The patient is a 35 year old white female,  patient of Dr. Marylen Ponto, who last night developed a heavy feeling in her  chest with dyspnea that persisted until today.  She was seen at Dr. Marylen Ponto  office by a physician's assistant, and was sent to have a CT scan, after  which she went back to work.  CT scan showed possible pulmonary embolism,  and she was called and told to come to the emergency department at [facility] for admission.  She had been started on birth  control pills three weeks ago by Dr. Nicholas Lose, and is a smoker, and is also  overweight, so does have some risk factors for pulmonary embolism.   PAST MEDICAL HISTORY:  1. Hypothyroidism.  2. Hypercholesterolemia.  3. Periodic labile hypertension.  4. Obsessive compulsive disorder with depression.   PAST SURGICAL HISTORY:  1. Tonsillectomy.  2. She has had a tubal pregnancy in the past with an exploratory     laparoscopic procedure.  3. She has had vaginal surgery.  4. She has had a D&C in the past.  5. History of miscarriage.   ALLERGIES:  No known drug allergies.   MEDICATIONS:  1. Synthroid 0.125 mg.  2. Used up until today, birth control pills.   FAMILY HISTORY:  The patient's father is 61 and has hypercholesterolemia.  Her mother is 61 and is well.  She has one sister who is 24 and well.  She  has one son who is 5 with high cholesterol.  She has one maternal uncle who  has had pulmonary  embolisms.   SOCIAL HISTORY:  The patient is married.  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  She does smoke 1/2 to 1 pack per day.  She does  not use alcohol.  She uses scant caffeine.  She does not abuse drugs.  She  is left-handed.   REVIEW OF SYMPTOMS:  CONSTITUTIONAL:  The patient says that she had both  fever and chills recently, as well as sweating.  Her weight is stable.  She  is without edema.  She sleeps well.  HEENT:  The patient denies diplopia or  blurring.  She does not wear glasses, but she does wear contacts.  She  denies glaucoma or cataracts.  The patient  denies deafness, tinnitus,  rhinorrhea, sneezing, dysgeusia, or dentures.  She says that she has an acid  bump on her tongue and there is a small barely preceptable lesion at the tip  of her tongue.  CARDIOVASCULAR:  The patient had tightness in her chest,  pounding palpitations, dyspnea, and orthopnea from last night until today.  She states that she has had some pain on ambulation in her left leg for a  few weeks.  RESPIRATORY:  The patient denies coughing, sputum production, or  wheezing.  She smokes 1/2 to 1 pack per day and does snore some.  GASTROINTESTINAL:  The patient denies dysphagia, indigestion, heartburn,  diarrhea, or constipation.  She has had nausea for several days.  GENITOURINARY:  The patient denies dysuria, pyuria, hematuria, anuria,  hesitation, or frequency.  She does have nocturia 0 to 2 times, and her last  menstrual period was 03/08/02.  MUSCULOSKELETAL:  The patient says that both  of her knees hurt from time to time.  She has had pain in her left leg for  about a month, and weakness in that leg for about a month.  Her gait is  steady.  She denies any falls.  SKIN:  The patient denies rashes or other  skin problems.  BREASTS:  The patient denies masses, lumps, tenderness, or  discharge of the breasts.  NEUROLOGIC:  The patient has felt faint and dizzy  today, but is  without syncope.  She has felt numb in her left leg.  She  denies seizures or symptoms of stroke.  She does have headaches.  PSYCHIATRIC:  The patient admits to both depression and anxiety.  She denies  anorexia or hallucinations.  ENDOCRINE:  The patient has hypothyroidism, but  denies diabetes mellitus.  She says that she sometimes feels both very  thirsty and very hungry, but she does not have excessive thirst or hunger or  excessive urine volume output.  HEMATOLOGIC:  The patient bruises easily,  but does not bleed easily.  LYMPHATIC:  The patient denies adenopathy of the  neck, axillae, and groin.  ALLERGIC:  The patient has no known drug  allergies, and has no known non-drug allergies.  ALL OTHER SYSTEMS:  All  other systems are negative.   PHYSICAL EXAMINATION:  VITAL SIGNS:  The patient's temperature on admission  was 100.5 degrees Farenheit.  Her pulse was 109.  Her respirations 20.  Her  blood pressure was 199/85.  Her weight about 214 pounds.  Her height about 5  feet 9 inches.  Her SAO2 was 100% on 2 L of oxygen by nasal cannula.  She is  35 years old.  GENERAL:  The patient is a well-developed, well-nourished white female in no  acute distress.  PSYCHIATRIC:  Upon my arrival she was pleasant, cooperative, and  appropriate, but during the time that I was in the emergency room she became  increasingly more anxious and especially after some family members arrived.  She feels uncomfortable being at [facility] because she works at  [employer], and states that she would like to be transferred to [facility].  HEENT:  The patient's pupils are equal, round, reactive to light and 2 mm in  diameter.  Extraocular movements were intact.  She is normocephalic,  atraumatic.  Her mouth is moist.  Her oropharynx is benign.  NECK:  The patient's neck is supple with a midline trachea.  She is without jugular venous distention, bruit, thyromegaly, or hepatojugular reflux.  CHEST:  The patient's  chest is clear to auscultation and percussion.  She is  eupneic.  BREASTS:  The patient's breasts show normal contour for her weight without  discharge or tenderness.  ADENOPATHY:  The patient is without cervical adenopathy.  CARDIAC:  The patient has a regular rate and rhythm, S1 and S2 are heard.  No murmurs, rubs, gallops, or clicks are heard.  ABDOMEN:  The patient's abdomen is soft.  She has positive bowel sounds.  She is not distended.  She is somewhat obese, but nontender.  GENITOURINARY:  The patient is premenopausal, and recently was on birth  control pills.  She is nontender over her bladder.  EXTREMITIES:  The patient moves all extremities x4.  Her strength is 5/5 in  her upper and lower extremities.  She is without ankle edema.  SKIN:  The patient's skin is warm and dry without jaundice, cyanosis,  pallor, or rashes.  She has brisk capillary refill.  NEUROLOGIC:  The patient is conscious, alert, and oriented to person, place,  time, and situation.  Cranial nerves II-XII are grossly intact.   LABORATORY DATA:  The patient's EKG shows normal sinus rhythm.  Her chest x-  ray, PA and lateral, showed no acute disease.  Her ABG's had the following  values:  pH 7.436, CO2 37.1, O2 85.7, HCO2 24.4, PCO2 25.5, base excess 0.4,  oxygen saturation is 96.8.  Her antithrombin-3 was 96.  Her PTT was 29.  Her  PT was 13.9.  Her INR was 1.0.  Her CBC showed white blood cell count of  7.7, hemoglobin of 12.7, hematocrit of 36.5, platelets of 232.  Sodium is  138, potassium 3.8, chloride is 107, CO2 is 28, BUN 13, creatinine 0.9,  blood sugar 121.  Her CT report was suspicious for a possible thrombus  within the proximal aspect of the right pulmonary artery, two peripheral  opacities in posterior lungs, likely due to compressive atelectasis,  although fibrosis not excludable, and the results were called to the  doctor's office at [facility].   IMPRESSION:  1. Pulmonary embolism.  2. Dyspnea.  3.  Chest tightness.  4. Hypothyroidism.   PLAN:  1. Admit to the hospital.  2. O2 by nasal cannula.  3. Heparin by pharmacy protocol.  4. Admission labs and chest x-ray.  5. Venous Dopplers of the legs.     Arletha Pili. Virgina Norfolk, M.D.    LMK/MEDQ  D:  03/31/2002  T:  03/31/2002  Job:  [id number]

## 2010-10-05 NOTE — Discharge Summary (Signed)
[name], [name]           ACCOUNT NO.:  [id number]   MEDICAL RECORD NO.:  [id number]          PATIENT TYPE:  INP   LOCATION:  9122                          FACILITY:  [facility]   PHYSICIAN:  Michelle L. Grewal, M.D.DATE OF BIRTH:  1976/05/09   DATE OF ADMISSION:  10/27/2004  DATE OF DISCHARGE:  11/09/2004                                 DISCHARGE SUMMARY   ADMISSION DIAGNOSES:  1.  Intrauterine pregnancy at 46 weeks' estimated gestational age.  2.  Preterm labor.  3.  Protein S deficiency with history of pulmonary embolus and deep vein      thrombosis.  4.  Hypothyroidism.   DISCHARGE DIAGNOSES:  1.  Status post low transverse cesarean section.  2.  A viable female infant.   PROCEDURE:  Primary low transverse cesarean section.   REASON FOR ADMISSION:  Please see written H&P.   HOSPITAL COURSE:  The patient is 35 year old gravida 3, para 1, that was  admitted to [facility] and 35 weeks' estimated gestational  age with preterm labor.  The patient had experienced some preterm labor and  had been treated with terbutaline.  However, it was discontinued due to side  effects.  The patient was noted to have continued contractions and was now  started on Procardia, which the patient did not respond to.  The patient was  now admitted for magnesium sulfate for tocolysis of her preterm labor.  The patient did have a history of a pulmonary embolus and was under the care  doctor Delford Field, not currently on anticoagulant therapy.  The patient also had  noted to have a vaginal revision surgery and desired cesarean delivery.   On admission, vital signs were stable.  Cervix was closed, 1 cm long.  Fetal  heart tones were reactive without decelerations.  Contractions were noted to  be four to five every hour.  The patient was started on magnesium sulfate  and IV antibiotics prophylactically.  On June 12, amniocentesis was  performed which was sent for lung maturity studies.  The  patient continued  on magnesium sulfate with good control of contractions.  L/S ratio did  return, which was immature,1.02 to 1, without PG.  Fetal heart tones  continued to be reactive.  Contractions were approximately every four  minutes.  Magnesium sulfate continued at approximately 3 g/hr.  The patient  continued under fetal surveillance and monitoring of her contractions.  By  June 20, the decision was made to proceed with the primary cesarean  delivery.  The patient was then taken to the operating room, where spinal  anesthesia was administered without difficulty.  A low transverse incision  was made with the delivery of a viable female infant weighing 8 pounds 2  ounces, Apgars of 8 at one minute and 9 at five minutes.  The patient  tolerated procedure well and was taken to the recovery room in stable  condition.  On postoperative day #1, the patient was without complaint.  Vital signs were stable.  Abdomen soft.  Fundus was firm and nontender.  Abdominal dressing was noted to be clean, dry and intact.  Laboratory  findings revealed hemoglobin of 10.4, platelet count 195,000, WBC count of  9.7.  Lovenox was started until the patient was fully ambulatory per  hematologist recommendation.  On postoperative day #2, vital signs were  stable.  She was afebrile.  Abdomen soft.  Fundus was firm and nontender.  Abdominal dressing had been removed revealing an incision that was clean,  dry, intact.  The patient was ambulating some, tolerating a regular diet  without complaints of nausea and vomiting.  On postoperative day #3, the  patient was feeling well.  She did complain of some itching with the Percocet.  Vital signs were  stable.  She was afebrile.  Abdomen soft.  Fundus was firm and nontender.  Incision was clean, dry and  intact.  Staples were removed.  The patient was now ambulating well,  tolerating a regular diet without complaints of nausea or vomiting.  Discharge instructions  reviewed.  The patient was later discharged home.   CONDITION ON DISCHARGE:  Stable.   DIET:  Regular as tolerated.   ACTIVITY:  No heavy lifting, no driving x2 weeks, no vaginal entry.   FOLLOW-UP:  The patient will follow up in the office in one week for an  incision check.  She is to call for temperature greater than 100 degrees,  persistent nausea or vomiting, heavy vaginal bleeding and/or redness or  drainage from the incisional site.   DISCHARGE MEDICATIONS:  1.  Demerol 50 mg one p.o. every four to six hours p.r.n.  2.  Motrin 600 mg every six hours.  3.  Synthroid 125 mcg one p.o. q.a.m.  4.  Prenatal vitamins one p.o. daily.       CC/MEDQ  D:  12/10/2004  T:  12/10/2004  Job:  [id number]
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
## 2010-10-19 ENCOUNTER — Ambulatory Visit (INDEPENDENT_AMBULATORY_CARE_PROVIDER_SITE_OTHER): Payer: 59 | Admitting: Licensed Clinical Social Worker

## 2010-10-19 DIAGNOSIS — F3189 Other bipolar disorder: Secondary | ICD-10-CM

## 2010-10-29 ENCOUNTER — Ambulatory Visit (INDEPENDENT_AMBULATORY_CARE_PROVIDER_SITE_OTHER): Payer: 59 | Admitting: Licensed Clinical Social Worker

## 2010-10-29 DIAGNOSIS — F3189 Other bipolar disorder: Secondary | ICD-10-CM

## 2010-11-27 ENCOUNTER — Ambulatory Visit (INDEPENDENT_AMBULATORY_CARE_PROVIDER_SITE_OTHER): Payer: 59 | Admitting: Licensed Clinical Social Worker

## 2010-11-27 DIAGNOSIS — F3189 Other bipolar disorder: Secondary | ICD-10-CM

## 2010-12-05 ENCOUNTER — Ambulatory Visit (INDEPENDENT_AMBULATORY_CARE_PROVIDER_SITE_OTHER): Payer: 59 | Admitting: Licensed Clinical Social Worker

## 2010-12-05 DIAGNOSIS — F3189 Other bipolar disorder: Secondary | ICD-10-CM

## 2011-01-02 ENCOUNTER — Ambulatory Visit (INDEPENDENT_AMBULATORY_CARE_PROVIDER_SITE_OTHER): Payer: 59 | Admitting: Licensed Clinical Social Worker

## 2011-01-02 DIAGNOSIS — F3189 Other bipolar disorder: Secondary | ICD-10-CM

## 2011-01-09 ENCOUNTER — Ambulatory Visit (INDEPENDENT_AMBULATORY_CARE_PROVIDER_SITE_OTHER): Payer: 59 | Admitting: Licensed Clinical Social Worker

## 2011-01-09 DIAGNOSIS — F3189 Other bipolar disorder: Secondary | ICD-10-CM

## 2011-01-16 ENCOUNTER — Ambulatory Visit: Payer: 59 | Admitting: Licensed Clinical Social Worker

## 2011-01-31 ENCOUNTER — Ambulatory Visit (INDEPENDENT_AMBULATORY_CARE_PROVIDER_SITE_OTHER): Payer: 59 | Admitting: Licensed Clinical Social Worker

## 2011-01-31 DIAGNOSIS — F3189 Other bipolar disorder: Secondary | ICD-10-CM

## 2011-02-05 ENCOUNTER — Ambulatory Visit: Payer: 59 | Admitting: Licensed Clinical Social Worker

## 2011-02-11 LAB — POCT RAPID STREP A: Streptococcus, Group A Screen (Direct): POSITIVE — AB

## 2011-02-12 ENCOUNTER — Ambulatory Visit (INDEPENDENT_AMBULATORY_CARE_PROVIDER_SITE_OTHER): Payer: 59 | Admitting: Licensed Clinical Social Worker

## 2011-02-12 DIAGNOSIS — F3189 Other bipolar disorder: Secondary | ICD-10-CM

## 2011-02-12 LAB — STREP A DNA PROBE: Group A Strep Probe: NEGATIVE

## 2011-02-12 LAB — POCT RAPID STREP A: Streptococcus, Group A Screen (Direct): NEGATIVE

## 2011-02-27 ENCOUNTER — Ambulatory Visit (INDEPENDENT_AMBULATORY_CARE_PROVIDER_SITE_OTHER): Payer: 59 | Admitting: Licensed Clinical Social Worker

## 2011-02-27 DIAGNOSIS — F3189 Other bipolar disorder: Secondary | ICD-10-CM

## 2011-03-15 NOTE — Patient Instructions (Addendum)
   Your procedure is scheduled on: Wednesday November 7th  Enter through the [employer] of [facility] at: [employer] up the phone at the desk and dial [phone] and inform us of your arrival.  Please call this number if you have any problems the morning of surgery: [phone]  Remember: Do not eat food after midnight: Tuesday Do not drink clear liquids after:midnight Tuesday Take these medicines the morning of surgery with a SIP OF WATER: may take klonopin and pristiq am of surgery with sips of water  Do not wear jewelry, make-up, or FINGER nail polish Do not wear lotions, powders, or perfumes.  You may not wear deodorant. Do not shave 48 hours prior to surgery. Do not bring valuables to the hospital.  Leave suitcase in the car. After Surgery it may be brought to your room. For patients being admitted to the hospital, checkout time is 11:00am the day of discharge.  Remember to use your hibiclens as instructed.Please shower with 1/2 bottle the evening before your surgery and the other 1/2 bottle the morning of surgery.

## 2011-03-19 ENCOUNTER — Encounter (HOSPITAL_COMMUNITY)
Admission: RE | Admit: 2011-03-19 | Discharge: 2011-03-19 | Disposition: A | Discharge status: Home / Self Care | Payer: 59 | Source: Ambulatory Visit | Attending: Obstetrics and Gynecology | Admitting: Obstetrics and Gynecology

## 2011-03-19 ENCOUNTER — Encounter (HOSPITAL_COMMUNITY): Payer: Self-pay

## 2011-03-19 HISTORY — DX: Essential (primary) hypertension: I10

## 2011-03-19 HISTORY — DX: Depression, unspecified: F32.A

## 2011-03-19 HISTORY — DX: Headache: R51

## 2011-03-19 HISTORY — DX: Hypothyroidism, unspecified: E03.9

## 2011-03-19 HISTORY — DX: Unspecified osteoarthritis, unspecified site: M19.90

## 2011-03-19 HISTORY — DX: Other primary thrombophilia: D68.59

## 2011-03-19 HISTORY — DX: Major depressive disorder, single episode, unspecified: F32.9

## 2011-03-19 HISTORY — DX: Anxiety disorder, unspecified: F41.9

## 2011-03-19 HISTORY — DX: Nonrheumatic mitral (valve) prolapse: I34.1

## 2011-03-19 HISTORY — DX: Disease of blood and blood-forming organs, unspecified: D75.9

## 2011-03-19 LAB — CBC
HCT: 40.5 % (ref 36.0–46.0)
Hemoglobin: 14 g/dL (ref 12.0–15.0)
MCH: 30.5 pg (ref 26.0–34.0)
MCHC: 34.6 g/dL (ref 30.0–36.0)
MCV: 88.2 fL (ref 78.0–100.0)
Platelets: 225 10*3/uL (ref 150–400)
RBC: 4.59 MIL/uL (ref 3.87–5.11)
RDW: 12.4 % (ref 11.5–15.5)
WBC: 8.1 10*3/uL (ref 4.0–10.5)

## 2011-03-19 LAB — SURGICAL PCR SCREEN
MRSA, PCR: NEGATIVE
Staphylococcus aureus: NEGATIVE

## 2011-03-19 NOTE — Pre-Procedure Instructions (Signed)
Dr Malen Gauze updated on pt history of protein s deficiency-history of dvt and pulmonary embolus in 2006- coumadin at that time for 6 months-no anticoagulant therapy.-no orders

## 2011-03-21 ENCOUNTER — Ambulatory Visit (INDEPENDENT_AMBULATORY_CARE_PROVIDER_SITE_OTHER): Payer: 59 | Admitting: Licensed Clinical Social Worker

## 2011-03-21 DIAGNOSIS — F3189 Other bipolar disorder: Secondary | ICD-10-CM

## 2011-03-26 NOTE — H&P (Signed)
35 year old Gravida 3 Para 2 presents for LAVH and BSO.  She has chronic pelvic pain and severe dysmenorrhea and irregular bleeding on birth control pills. An ultrasound in the office revealed adenomyosis.  Medical History Abnormal PAP smear Headaches IBS Hypothyroidism UTI Hypertension  Surgical History Tonsillectomy 1993 Vaginal reconstruction?perineoplasty 2001 Cesarean section 2006  Medications Synthroid Klonopin pristiq norvasc crestor zetia micronor  Allergies NONE  Social history No tobacco or alcohol   Review of Systems Positive for fatigue , weight gain, pelvic pain, anxiety  Afebrile vital signs stable General alert and oriented Lungs are clear  Cardiac regular rate and rhythm Abdomen is soft and non tender Pelvic is normal except for uterine tenderness Ultrasound as discussed above  Impression Chronic pelvic pain Possible adenomyosis  PLAN LAVH BSO The risks of the procedure have been discussed with the patient and are documented in the office chart.

## 2011-03-27 ENCOUNTER — Ambulatory Visit (HOSPITAL_COMMUNITY)
Admission: RE | Admit: 2011-03-27 | Discharge: 2011-03-29 | Disposition: A | Discharge status: Home / Self Care | Payer: 59 | Source: Ambulatory Visit | Attending: Obstetrics and Gynecology | Admitting: Obstetrics and Gynecology

## 2011-03-27 ENCOUNTER — Ambulatory Visit (HOSPITAL_COMMUNITY): Payer: 59 | Admitting: Anesthesiology

## 2011-03-27 ENCOUNTER — Other Ambulatory Visit: Payer: Self-pay | Admitting: Obstetrics and Gynecology

## 2011-03-27 ENCOUNTER — Encounter (HOSPITAL_COMMUNITY): Payer: Self-pay | Admitting: Anesthesiology

## 2011-03-27 ENCOUNTER — Encounter (HOSPITAL_COMMUNITY)
Admission: RE | Disposition: A | Discharge status: Home / Self Care | Payer: Self-pay | Source: Ambulatory Visit | Attending: Obstetrics and Gynecology

## 2011-03-27 ENCOUNTER — Encounter (HOSPITAL_COMMUNITY): Payer: Self-pay | Admitting: *Deleted

## 2011-03-27 DIAGNOSIS — Z01812 Encounter for preprocedural laboratory examination: Secondary | ICD-10-CM | POA: Insufficient documentation

## 2011-03-27 DIAGNOSIS — N8000 Endometriosis of the uterus, unspecified: Secondary | ICD-10-CM | POA: Insufficient documentation

## 2011-03-27 DIAGNOSIS — E039 Hypothyroidism, unspecified: Secondary | ICD-10-CM

## 2011-03-27 DIAGNOSIS — B373 Candidiasis of vulva and vagina: Secondary | ICD-10-CM

## 2011-03-27 DIAGNOSIS — K644 Residual hemorrhoidal skin tags: Secondary | ICD-10-CM

## 2011-03-27 DIAGNOSIS — N83209 Unspecified ovarian cyst, unspecified side: Secondary | ICD-10-CM | POA: Insufficient documentation

## 2011-03-27 DIAGNOSIS — K602 Anal fissure, unspecified: Secondary | ICD-10-CM

## 2011-03-27 DIAGNOSIS — B3731 Acute candidiasis of vulva and vagina: Secondary | ICD-10-CM

## 2011-03-27 DIAGNOSIS — K589 Irritable bowel syndrome without diarrhea: Secondary | ICD-10-CM

## 2011-03-27 DIAGNOSIS — R1012 Left upper quadrant pain: Secondary | ICD-10-CM

## 2011-03-27 DIAGNOSIS — N938 Other specified abnormal uterine and vaginal bleeding: Secondary | ICD-10-CM | POA: Insufficient documentation

## 2011-03-27 DIAGNOSIS — Z01818 Encounter for other preprocedural examination: Secondary | ICD-10-CM | POA: Insufficient documentation

## 2011-03-27 DIAGNOSIS — K625 Hemorrhage of anus and rectum: Secondary | ICD-10-CM

## 2011-03-27 DIAGNOSIS — Z9089 Acquired absence of other organs: Secondary | ICD-10-CM

## 2011-03-27 DIAGNOSIS — K219 Gastro-esophageal reflux disease without esophagitis: Secondary | ICD-10-CM

## 2011-03-27 DIAGNOSIS — N949 Unspecified condition associated with female genital organs and menstrual cycle: Secondary | ICD-10-CM | POA: Insufficient documentation

## 2011-03-27 DIAGNOSIS — R933 Abnormal findings on diagnostic imaging of other parts of digestive tract: Secondary | ICD-10-CM

## 2011-03-27 HISTORY — PX: LAPAROSCOPIC ASSISTED VAGINAL HYSTERECTOMY: SHX5398

## 2011-03-27 HISTORY — PX: SALPINGOOPHORECTOMY: SHX82

## 2011-03-27 SURGERY — HYSTERECTOMY, VAGINAL, LAPAROSCOPY-ASSISTED
Anesthesia: General | Site: Abdomen | Wound class: Clean Contaminated

## 2011-03-27 MED ORDER — DIPHENHYDRAMINE HCL 12.5 MG/5ML PO ELIX
12.5000 mg | ORAL_SOLUTION | Freq: Four times a day (QID) | ORAL | Status: DC | PRN
  Filled 2011-03-27: qty 5

## 2011-03-27 MED ORDER — DESVENLAFAXINE SUCCINATE ER 100 MG PO TB24: 100.0000 mg | ORAL_TABLET | Freq: Every day | ORAL | Status: DC

## 2011-03-27 MED ORDER — PROPOFOL 10 MG/ML IV EMUL
INTRAVENOUS | Status: AC
  Filled 2011-03-27: qty 20

## 2011-03-27 MED ORDER — ROCURONIUM BROMIDE 100 MG/10ML IV SOLN
INTRAVENOUS | Status: DC | PRN
  Administered 2011-03-27: 25 mg via INTRAVENOUS
  Administered 2011-03-27: 5 mg via INTRAVENOUS

## 2011-03-27 MED ORDER — AMLODIPINE BESYLATE 5 MG PO TABS
5.0000 mg | ORAL_TABLET | Freq: Every day | ORAL | Status: DC
  Administered 2011-03-27: 5 mg via ORAL
  Filled 2011-03-27 (×3): qty 1

## 2011-03-27 MED ORDER — FENTANYL CITRATE 0.05 MG/ML IJ SOLN
INTRAMUSCULAR | Status: AC
  Filled 2011-03-27: qty 5

## 2011-03-27 MED ORDER — NALOXONE HCL 0.4 MG/ML IJ SOLN: 0.4000 mg | INTRAMUSCULAR | Status: DC | PRN

## 2011-03-27 MED ORDER — GLYCOPYRROLATE 0.2 MG/ML IJ SOLN
INTRAMUSCULAR | Status: DC | PRN
  Administered 2011-03-27: 0.1 mg via INTRAVENOUS
  Administered 2011-03-27: .5 mg via INTRAVENOUS

## 2011-03-27 MED ORDER — MIDAZOLAM HCL 5 MG/5ML IJ SOLN
INTRAMUSCULAR | Status: DC | PRN
  Administered 2011-03-27: 2 mg via INTRAVENOUS

## 2011-03-27 MED ORDER — NEOSTIGMINE METHYLSULFATE 1 MG/ML IJ SOLN
INTRAMUSCULAR | Status: AC
  Filled 2011-03-27: qty 10

## 2011-03-27 MED ORDER — BISACODYL 10 MG RE SUPP
10.0000 mg | Freq: Every day | RECTAL | Status: DC | PRN
  Administered 2011-03-28: 10 mg via RECTAL
  Filled 2011-03-27: qty 1

## 2011-03-27 MED ORDER — ROCURONIUM BROMIDE 50 MG/5ML IV SOLN
INTRAVENOUS | Status: AC
  Filled 2011-03-27: qty 1

## 2011-03-27 MED ORDER — LEVOTHYROXINE SODIUM 175 MCG PO TABS
175.0000 ug | ORAL_TABLET | Freq: Every day | ORAL | Status: DC
  Administered 2011-03-29: 175 ug via ORAL
  Filled 2011-03-27 (×3): qty 1

## 2011-03-27 MED ORDER — MIDAZOLAM HCL 2 MG/2ML IJ SOLN
INTRAMUSCULAR | Status: AC
  Filled 2011-03-27: qty 2

## 2011-03-27 MED ORDER — LIDOCAINE HCL (CARDIAC) 20 MG/ML IV SOLN
INTRAVENOUS | Status: DC | PRN
  Administered 2011-03-27: 100 mg via INTRAVENOUS

## 2011-03-27 MED ORDER — PROMETHAZINE HCL 25 MG/ML IJ SOLN: 12.5000 mg | INTRAMUSCULAR | Status: DC | PRN

## 2011-03-27 MED ORDER — FENTANYL CITRATE 0.05 MG/ML IJ SOLN
25.0000 ug | INTRAMUSCULAR | Status: DC | PRN
  Administered 2011-03-27: 50 ug via INTRAVENOUS

## 2011-03-27 MED ORDER — GLYCOPYRROLATE 0.2 MG/ML IJ SOLN
INTRAMUSCULAR | Status: AC
  Filled 2011-03-27: qty 1

## 2011-03-27 MED ORDER — SODIUM CHLORIDE 0.9 % IJ SOLN
INTRAMUSCULAR | Status: DC | PRN
  Administered 2011-03-27: 10 mL via INTRAVENOUS

## 2011-03-27 MED ORDER — NEOSTIGMINE METHYLSULFATE 1 MG/ML IJ SOLN
INTRAMUSCULAR | Status: DC | PRN
  Administered 2011-03-27: 2.5 mg via INTRAVENOUS

## 2011-03-27 MED ORDER — LACTATED RINGERS IV SOLN
INTRAVENOUS | Status: DC
  Administered 2011-03-27 (×3): via INTRAVENOUS

## 2011-03-27 MED ORDER — HYDROMORPHONE 0.3 MG/ML IV SOLN
INTRAVENOUS | Status: DC
  Administered 2011-03-27: 5.3 mL via INTRAVENOUS
  Administered 2011-03-27: 1.79 mg via INTRAVENOUS
  Administered 2011-03-27: 2 mL via INTRAVENOUS
  Administered 2011-03-28: 0.599 mg via INTRAVENOUS
  Administered 2011-03-28: 0.4 mg via INTRAVENOUS
  Filled 2011-03-27: qty 25

## 2011-03-27 MED ORDER — DEXAMETHASONE SODIUM PHOSPHATE 10 MG/ML IJ SOLN
INTRAMUSCULAR | Status: AC
  Filled 2011-03-27: qty 1

## 2011-03-27 MED ORDER — ONDANSETRON HCL 4 MG/2ML IJ SOLN
INTRAMUSCULAR | Status: AC
  Filled 2011-03-27: qty 2

## 2011-03-27 MED ORDER — ACETAMINOPHEN 325 MG PO TABS: 325.0000 mg | ORAL_TABLET | ORAL | Status: DC | PRN

## 2011-03-27 MED ORDER — BUPIVACAINE HCL (PF) 0.25 % IJ SOLN
INTRAMUSCULAR | Status: DC | PRN
  Administered 2011-03-27: 10 mL

## 2011-03-27 MED ORDER — FENTANYL CITRATE 0.05 MG/ML IJ SOLN
INTRAMUSCULAR | Status: AC
  Administered 2011-03-27: 50 ug via INTRAVENOUS
  Filled 2011-03-27: qty 2

## 2011-03-27 MED ORDER — LACTATED RINGERS IV SOLN
INTRAVENOUS | Status: DC
  Administered 2011-03-27 – 2011-03-28 (×3): via INTRAVENOUS

## 2011-03-27 MED ORDER — PROPOFOL 10 MG/ML IV EMUL
INTRAVENOUS | Status: DC | PRN
  Administered 2011-03-27: 200 mg via INTRAVENOUS

## 2011-03-27 MED ORDER — DOCUSATE SODIUM 100 MG PO CAPS
100.0000 mg | ORAL_CAPSULE | Freq: Every day | ORAL | Status: DC
  Administered 2011-03-28 – 2011-03-29 (×2): 100 mg via ORAL
  Filled 2011-03-27 (×2): qty 1

## 2011-03-27 MED ORDER — DEXAMETHASONE SODIUM PHOSPHATE 10 MG/ML IJ SOLN
INTRAMUSCULAR | Status: DC | PRN
  Administered 2011-03-27: 10 mg via INTRAVENOUS

## 2011-03-27 MED ORDER — KETOROLAC TROMETHAMINE 30 MG/ML IJ SOLN: 15.0000 mg | Freq: Once | INTRAMUSCULAR | Status: DC | PRN

## 2011-03-27 MED ORDER — MENTHOL 3 MG MT LOZG: 1.0000 | LOZENGE | OROMUCOSAL | Status: DC | PRN

## 2011-03-27 MED ORDER — SODIUM CHLORIDE 0.9 % IJ SOLN: 9.0000 mL | INTRAMUSCULAR | Status: DC | PRN

## 2011-03-27 MED ORDER — PROMETHAZINE HCL 25 MG/ML IJ SOLN: 6.2500 mg | INTRAMUSCULAR | Status: DC | PRN

## 2011-03-27 MED ORDER — LIDOCAINE HCL (CARDIAC) 20 MG/ML IV SOLN
INTRAVENOUS | Status: AC
  Filled 2011-03-27: qty 5

## 2011-03-27 MED ORDER — DESVENLAFAXINE SUCCINATE ER 100 MG PO TB24
100.0000 mg | ORAL_TABLET | Freq: Every day | ORAL | Status: DC
  Administered 2011-03-27 – 2011-03-29 (×3): 100 mg via ORAL
  Filled 2011-03-27 (×4): qty 1

## 2011-03-27 MED ORDER — EZETIMIBE 10 MG PO TABS
10.0000 mg | ORAL_TABLET | Freq: Every day | ORAL | Status: DC
  Administered 2011-03-27 – 2011-03-28 (×2): 10 mg via ORAL
  Filled 2011-03-27 (×3): qty 1

## 2011-03-27 MED ORDER — FENTANYL CITRATE 0.05 MG/ML IJ SOLN
INTRAMUSCULAR | Status: DC | PRN
  Administered 2011-03-27: 50 ug via INTRAVENOUS
  Administered 2011-03-27: 100 ug via INTRAVENOUS
  Administered 2011-03-27 (×2): 50 ug via INTRAVENOUS
  Administered 2011-03-27: 100 ug via INTRAVENOUS

## 2011-03-27 MED ORDER — HYDROMORPHONE HCL PF 1 MG/ML IJ SOLN
INTRAMUSCULAR | Status: AC
  Filled 2011-03-27: qty 1

## 2011-03-27 MED ORDER — LACTATED RINGERS IR SOLN
Status: DC | PRN
  Administered 2011-03-27: 3000 mL

## 2011-03-27 MED ORDER — TEMAZEPAM 15 MG PO CAPS: 15.0000 mg | ORAL_CAPSULE | Freq: Every evening | ORAL | Status: DC | PRN

## 2011-03-27 MED ORDER — CEFAZOLIN SODIUM 1-5 GM-% IV SOLN
1.0000 g | INTRAVENOUS | Status: AC
  Administered 2011-03-27: 1 g via INTRAVENOUS

## 2011-03-27 MED ORDER — GLYCOPYRROLATE 0.2 MG/ML IJ SOLN
INTRAMUSCULAR | Status: AC
  Filled 2011-03-27: qty 2

## 2011-03-27 MED ORDER — HYDROMORPHONE HCL PF 1 MG/ML IJ SOLN
INTRAMUSCULAR | Status: DC | PRN
  Administered 2011-03-27 (×2): 1 mg via INTRAVENOUS

## 2011-03-27 MED ORDER — ONDANSETRON HCL 4 MG/2ML IJ SOLN: 4.0000 mg | Freq: Four times a day (QID) | INTRAMUSCULAR | Status: DC | PRN

## 2011-03-27 MED ORDER — ONDANSETRON HCL 4 MG/2ML IJ SOLN
INTRAMUSCULAR | Status: DC | PRN
  Administered 2011-03-27: 4 mg via INTRAVENOUS

## 2011-03-27 MED ORDER — ENOXAPARIN SODIUM 40 MG/0.4ML ~~LOC~~ SOLN
40.0000 mg | SUBCUTANEOUS | Status: DC
  Administered 2011-03-28 – 2011-03-29 (×2): 40 mg via SUBCUTANEOUS
  Filled 2011-03-27 (×2): qty 0.4

## 2011-03-27 MED ORDER — FENTANYL CITRATE 0.05 MG/ML IJ SOLN
25.0000 ug | INTRAMUSCULAR | Status: DC | PRN
  Administered 2011-03-27 (×3): 50 ug via INTRAVENOUS

## 2011-03-27 MED ORDER — VENLAFAXINE HCL ER 150 MG PO CP24
150.0000 mg | ORAL_CAPSULE | Freq: Every day | ORAL | Status: DC
  Filled 2011-03-27 (×2): qty 1

## 2011-03-27 MED ORDER — DIPHENHYDRAMINE HCL 50 MG/ML IJ SOLN: 12.5000 mg | Freq: Four times a day (QID) | INTRAMUSCULAR | Status: DC | PRN

## 2011-03-27 MED ORDER — IBUPROFEN 600 MG PO TABS: 600.0000 mg | ORAL_TABLET | Freq: Four times a day (QID) | ORAL | Status: DC | PRN

## 2011-03-27 MED ORDER — BISACODYL 5 MG PO TBEC
5.0000 mg | DELAYED_RELEASE_TABLET | Freq: Every day | ORAL | Status: DC | PRN
  Filled 2011-03-27: qty 1

## 2011-03-27 MED ORDER — CEFAZOLIN SODIUM 1-5 GM-% IV SOLN
INTRAVENOUS | Status: AC
  Filled 2011-03-27: qty 50

## 2011-03-27 MED ORDER — TRAMADOL HCL 50 MG PO TABS: 50.0000 mg | ORAL_TABLET | Freq: Four times a day (QID) | ORAL | Status: DC | PRN

## 2011-03-27 MED ORDER — CLONAZEPAM 1 MG PO TABS
1.0000 mg | ORAL_TABLET | Freq: Every day | ORAL | Status: DC
  Administered 2011-03-27 – 2011-03-29 (×3): 1 mg via ORAL
  Filled 2011-03-27 (×3): qty 1

## 2011-03-27 SURGICAL SUPPLY — 41 items
ADH SKN CLS APL DERMABOND .7 (GAUZE/BANDAGES/DRESSINGS) ×2
BARRIER ADHS 3X4 INTERCEED (GAUZE/BANDAGES/DRESSINGS) IMPLANT
BRR ADH 4X3 ABS CNTRL BYND (GAUZE/BANDAGES/DRESSINGS)
CABLE HIGH FREQUENCY MONO STRZ (ELECTRODE) IMPLANT
CATH ROBINSON RED A/P 16FR (CATHETERS) IMPLANT
CHLORAPREP W/TINT 26ML (MISCELLANEOUS) ×3 IMPLANT
CLOTH BEACON ORANGE TIMEOUT ST (SAFETY) ×3 IMPLANT
CONT PATH 16OZ SNAP LID 3702 (MISCELLANEOUS) ×3 IMPLANT
COVER TABLE BACK 60X90 (DRAPES) ×3 IMPLANT
DECANTER SPIKE VIAL GLASS SM (MISCELLANEOUS) IMPLANT
DERMABOND ADVANCED (GAUZE/BANDAGES/DRESSINGS) ×1
DERMABOND ADVANCED .7 DNX12 (GAUZE/BANDAGES/DRESSINGS) ×2 IMPLANT
DRAPE UTILITY XL STRL (DRAPES) ×3 IMPLANT
ELECT REM PT RETURN 9FT ADLT (ELECTROSURGICAL) ×3
ELECTRODE REM PT RTRN 9FT ADLT (ELECTROSURGICAL) ×2 IMPLANT
EVACUATOR SMOKE 8.L (FILTER) IMPLANT
GLOVE BIO SURGEON STRL SZ 6.5 (GLOVE) ×6 IMPLANT
GLOVE BIOGEL PI IND STRL 6.5 (GLOVE) ×2 IMPLANT
GLOVE BIOGEL PI INDICATOR 6.5 (GLOVE) ×1
GOWN PREVENTION PLUS LG XLONG (DISPOSABLE) ×12 IMPLANT
NDL INSUFFLATION 14GA 120MM (NEEDLE) ×2 IMPLANT
NEEDLE INSUFFLATION 14GA 120MM (NEEDLE) ×3 IMPLANT
NS IRRIG 1000ML POUR BTL (IV SOLUTION) ×3 IMPLANT
PACK LAVH (CUSTOM PROCEDURE TRAY) ×3 IMPLANT
SEALER TISSUE G2 CVD JAW 45CM (ENDOMECHANICALS) ×3 IMPLANT
SET IRRIG TUBING LAPAROSCOPIC (IRRIGATION / IRRIGATOR) IMPLANT
SOLUTION ELECTROLUBE (MISCELLANEOUS) IMPLANT
STRIP CLOSURE SKIN 1/4X3 (GAUZE/BANDAGES/DRESSINGS) IMPLANT
SUT VIC AB 0 CT1 18XCR BRD8 (SUTURE) ×4 IMPLANT
SUT VIC AB 0 CT1 36 (SUTURE) ×9 IMPLANT
SUT VIC AB 0 CT1 8-18 (SUTURE) ×6
SUT VIC AB 3-0 PS2 18 (SUTURE)
SUT VIC AB 3-0 PS2 18XBRD (SUTURE) IMPLANT
SUT VICRYL 0 TIES 12 18 (SUTURE) ×3 IMPLANT
SUT VICRYL 0 UR6 27IN ABS (SUTURE) ×3 IMPLANT
TOWEL OR 17X24 6PK STRL BLUE (TOWEL DISPOSABLE) ×6 IMPLANT
TRAY FOLEY CATH 14FR (SET/KITS/TRAYS/PACK) ×3 IMPLANT
TROCAR Z-THREAD BLADED 11X100M (TROCAR) ×3 IMPLANT
TROCAR Z-THREAD BLADED 5X100MM (TROCAR) ×3 IMPLANT
WARMER LAPAROSCOPE (MISCELLANEOUS) ×3 IMPLANT
WATER STERILE IRR 1000ML POUR (IV SOLUTION) ×3 IMPLANT

## 2011-03-27 NOTE — Anesthesia Postprocedure Evaluation (Signed)
Anesthesia Post Note  Patient: Andrea [name] November  Procedure(s) Performed:  LAPAROSCOPIC ASSISTED VAGINAL HYSTERECTOMY; SALPINGO OOPHERECTOMY  Anesthesia type: General  Patient location: [facility]  Post pain: Adequate analgesia  Post assessment: Post-op Vital signs reviewed  Last Vitals:  Filed Vitals:   03/27/11 1630  BP: 125/69  Pulse: 75  Temp: 37.3 C  Resp: 16    Post vital signs: Reviewed and stable  Level of consciousness: sedated  Complications: No apparent anesthesia complications

## 2011-03-27 NOTE — Anesthesia Postprocedure Evaluation (Signed)
  Anesthesia Post-op Note  Patient: Andrea [name]  Procedure(s) Performed:  LAPAROSCOPIC ASSISTED VAGINAL HYSTERECTOMY; SALPINGO OOPHERECTOMY  Patient is awake and responsive. Pain and nausea are reasonably well controlled. Vital signs are stable and clinically acceptable. Oxygen saturation is clinically acceptable. There are no apparent anesthetic complications at this time. Patient is ready for discharge.

## 2011-03-27 NOTE — Addendum Note (Signed)
Addendum  created 03/27/11 1644 by Cephus Shelling   Modules edited:Notes Section

## 2011-03-27 NOTE — Anesthesia Preprocedure Evaluation (Addendum)
Anesthesia Evaluation  Patient identified by MRN, date of birth, ID band Patient awake    Reviewed: Allergy & Precautions, H&P , Patient's Chart, lab work & pertinent test results, reviewed documented beta blocker date and time   History of Anesthesia Complications Negative for: history of anesthetic complications  Airway Mallampati: II TM Distance: >3 FB Neck ROM: full    Dental No notable dental hx.    Pulmonary neg pulmonary ROS,  clear to auscultation  Pulmonary exam normal       Cardiovascular Exercise Tolerance: Good hypertension, neg cardio ROS + Valvular Problems/Murmurs MVP regular Normal    Neuro/Psych  Headaches, PSYCHIATRIC DISORDERS Negative Neurological ROS  Negative Psych ROS   GI/Hepatic negative GI ROS, Neg liver ROS, GERD-  ,  Endo/Other  Negative Endocrine ROSHypothyroidism   Renal/GU negative Renal ROS     Musculoskeletal   Abdominal   Peds  Hematology negative hematology ROS (+)   Anesthesia Other Findings Protein S deficiency  Reproductive/Obstetrics negative OB ROS                           Anesthesia Physical Anesthesia Plan  ASA: II  Anesthesia Plan: General   Post-op Pain Management:    Induction:   Airway Management Planned:   Additional Equipment:   Intra-op Plan:   Post-operative Plan:   Informed Consent: I have reviewed the patients History and Physical, chart, labs and discussed the procedure including the risks, benefits and alternatives for the proposed anesthesia with the patient or authorized representative who has indicated his/her understanding and acceptance.   Dental Advisory Given  Plan Discussed with: CRNA and Surgeon  Anesthesia Plan Comments:         Anesthesia Quick Evaluation

## 2011-03-27 NOTE — Anesthesia Procedure Notes (Addendum)
Procedure Name: Intubation Date/Time: 03/27/2011 7:24 AM Performed by: Karleen Dolphin Pre-anesthesia Checklist: Emergency Drugs available, Timeout performed, Suction available, Patient being monitored and Patient identified Patient Re-evaluated:Patient Re-evaluated prior to inductionOxygen Delivery Method: Circle System Utilized Preoxygenation: Pre-oxygenation with 100% oxygen Intubation Type: IV induction Ventilation: Mask ventilation without difficulty Laryngoscope Size: Mac and 3 Grade View: Grade I Tube type: Oral Tube size: 7.0 mm Number of attempts: 1 Airway Equipment and Method: stylet Placement Confirmation: ETT inserted through vocal cords under direct vision,  breath sounds checked- equal and bilateral and positive ETCO2 Secured at: 20 cm Tube secured with: Tape Dental Injury: Teeth and Oropharynx as per pre-operative assessment

## 2011-03-27 NOTE — Op Note (Signed)
Andrea [name], Andrea [name]           ACCOUNT NO.:  [id number]  MEDICAL RECORD NO.:  [id number]  LOCATION:  [facility]                          FACILITY:  [facility]  PHYSICIAN:  Hermenia Fritcher L. Ruffin Lada, M.D.DATE OF BIRTH:  10-25-1975  DATE OF PROCEDURE:  03/27/2011 DATE OF DISCHARGE:                              OPERATIVE REPORT   PREOPERATIVE DIAGNOSES:  Chronic pelvic pain, abnormal uterine bleeding, adenomyosis and ovarian cyst.  POSTOPERATIVE DIAGNOSES:  Chronic pelvic pain, abnormal uterine bleeding, adenomyosis and ovarian cyst.  PROCEDURE:  Laparoscopic-assisted vaginal hysterectomy and bilateral salpingo-oophorectomy.  SURGEON:  Laveah Gloster L. Vincente Poli, MD  ANESTHESIA:  General.  ASSISTANT:  Freddy Finner, MD  ESTIMATED BLOOD LOSS:  250 mL.  PATHOLOGY:  Uterus, cervix, tubes, and ovaries.  DRAINS:  Foley.  COMPLICATIONS:  None.  PROCEDURE:  The patient was taken to the operating room.  She was intubated without difficulty.  She was then prepped and draped in usual sterile fashion.  Foley catheter was inserted and draining clear urine. Attention was turned to the abdomen where a small infraumbilical incision was made.  The Veress needle was inserted.  Pneumoperitoneum was performed.  The Veress needle was removed, and the 11-mm trocar was inserted.  The laparoscoped was introduced through the trocar sheath. The patient was placed in Trendelenburg position.  A 5-mm port was placed suprapubically under direct visualization.  Exam revealed a slightly enlarged uterus, some adenomyosis most probable, but pathology will be pending.  There was a small subserosal fibroid.  Ovaries were unremarkable except that she did have what looked to be a simple cyst on her left ovary.  No endometriosis was noted, and no pelvic adhesions were noted.  We identified the ureters in their normal location.  I grasped the right fallopian tube and identified the IP ligament, and then placed the gyrus across that  with careful attention to avoid the ureter which was well away from where we were.  We burned that and cauterized that and cut that and transected it and carried that down to the round ligament with excellent hemostasis.  This was done on the right side and then on the left side in normal fashion.  At this point, we released the pneumoperitoneum, went down to the vagina, made a circumferential incision around the cervix, entered the posterior cul-de- sac using Mayo scissors and entered the anterior cul-de-sac as well. Curved Heaney clamps were used to clamp the uterosacral cardinal ligaments on either side.  Each pedicle was clamped, cut, and suture ligated using 0 Vicryl suture.  We walked our way up the broad ligament. Each pedicle was clamped, cut, and suture ligated using 0 Vicryl suture. We then retroflexed the uterus, clamped the remainder of the broad ligament.  The specimen was removed and identified the cervix, uterus, tubes and ovaries.  The pedicles were secured using suture ligature of 0 Vicryl suture on each side.  The posterior cuff was closed using 0 Vicryl running stitch.  The cuff was closed completely anterior to posterior and a running locked stitch using 0 Vicryl suture.  Hemostasis was excellent throughout the procedure.  I then went back up to the abdomen, replaced the pneumoperitoneum, irrigated the pelvis and hemostasis was  100%.  We then reduced the pneumoperitoneum, and expressed as much gas as we could.  We removed the trocars.  The infraumbilical trocar site was closed using 0 Vicryl, and the skin was closed with 3-0 Vicryl interrupted.  Dermabond was applied to both incisions.  All sponge, lap, and instrument counts were correct x2.  The patient went to recovery room in stable condition.     Dixie Jafri L. Vincente Poli, M.D.     Florestine Avers  D:  03/27/2011  T:  03/27/2011  Job:  [id number]

## 2011-03-27 NOTE — Transfer of Care (Signed)
Immediate Anesthesia Transfer of Care Note  Patient: Andrea [name]  Procedure(s) Performed:  LAPAROSCOPIC ASSISTED VAGINAL HYSTERECTOMY; SALPINGO OOPHERECTOMY  Patient Location: PACU  Anesthesia Type: General  Level of Consciousness: awake and sedated  Airway & Oxygen Therapy: Patient Spontanous Breathing and Patient connected to nasal cannula oxygen  Post-op Assessment: Report given to PACU RN and Post -op Vital signs reviewed and stable  Post vital signs: Reviewed and stable  Complications: No apparent anesthesia complications

## 2011-03-27 NOTE — Progress Notes (Signed)
History and Physical reviewed. No significant changes. Will proceed with LAVH and BSO Risks have already been reviewed with the patient. Documented in my office chart.

## 2011-03-27 NOTE — Brief Op Note (Signed)
03/27/2011  8:48 AM  PATIENT:  Andrea [name]  35 y.o. female  PRE-OPERATIVE DIAGNOSIS:  pelvic pain, ovarian cysts, adenomyosis POST-OPERATIVE DIAGNOSIS:  pelvic pain, ovarian cysts, adenomyosis  PROCEDURE:  Procedure(s): LAPAROSCOPIC ASSISTED VAGINAL HYSTERECTOMY BILATERAL SALPINGO OOPHERECTOMY  SURGEON:  Surgeon(s): Jeani Hawking, MD Freddy Finner  PHYSICIAN ASSISTANT:   ASSISTANTS: none   ANESTHESIA:   general  EBL:  Total I/O In: 1000 [I.V.:1000] Out: 225 [Urine:100; Blood:125]  BLOOD ADMINISTERED:none  DRAINS: Urinary Catheter (Foley)   LOCAL MEDICATIONS USED:  MARCAINE 10 CC  SPECIMEN:  Source of Specimen:  uterus, cervix, tubes and ovaries  DISPOSITION OF SPECIMEN:  PATHOLOGY  COUNTS:  YES  TOURNIQUET:  * No tourniquets in log *  DICTATION: .Other Dictation: Dictation Number [id number]  PLAN OF CARE: Admit for overnight observation  PATIENT DISPOSITION:  PACU - hemodynamically stable.   Delay start of Pharmacological VTE agent (>24hrs) due to surgical blood loss or risk of bleeding:  not applicable

## 2011-03-28 ENCOUNTER — Encounter (HOSPITAL_COMMUNITY): Payer: Self-pay | Admitting: Obstetrics and Gynecology

## 2011-03-28 LAB — BASIC METABOLIC PANEL
BUN: 9 mg/dL (ref 6–23)
CO2: 31 mEq/L (ref 19–32)
Calcium: 8.9 mg/dL (ref 8.4–10.5)
Chloride: 100 mEq/L (ref 96–112)
Creatinine, Ser: 0.78 mg/dL (ref 0.50–1.10)
GFR calc Af Amer: >90 mL/min (ref >90)
GFR calc non Af Amer: >90 mL/min (ref >90)
Glucose, Bld: 142 mg/dL — ABNORMAL HIGH (ref 70–99)
Potassium: 3.9 mEq/L (ref 3.5–5.1)
Sodium: 135 mEq/L (ref 135–145)

## 2011-03-28 LAB — CBC
HCT: 32.9 % — ABNORMAL LOW (ref 36.0–46.0)
Hemoglobin: 11.2 g/dL — ABNORMAL LOW (ref 12.0–15.0)
MCH: 30.2 pg (ref 26.0–34.0)
MCHC: 34 g/dL (ref 30.0–36.0)
MCV: 88.7 fL (ref 78.0–100.0)
Platelets: 199 10*3/uL (ref 150–400)
RBC: 3.71 MIL/uL — ABNORMAL LOW (ref 3.87–5.11)
RDW: 12.4 % (ref 11.5–15.5)
WBC: 11.9 10*3/uL — ABNORMAL HIGH (ref 4.0–10.5)

## 2011-03-28 MED ORDER — SIMETHICONE 80 MG PO CHEW
80.0000 mg | CHEWABLE_TABLET | Freq: Four times a day (QID) | ORAL | Status: DC
  Administered 2011-03-28 – 2011-03-29 (×6): 80 mg via ORAL

## 2011-03-28 MED ORDER — OXYCODONE-ACETAMINOPHEN 5-325 MG PO TABS
2.0000 | ORAL_TABLET | ORAL | Status: DC | PRN
  Administered 2011-03-28 – 2011-03-29 (×4): 2 via ORAL
  Filled 2011-03-28 (×4): qty 2

## 2011-03-28 NOTE — Progress Notes (Signed)
Patient is complaining of gas pain. Tolerating pos. Afebrile Vital signs stable General alert and oriented, anxious Lung CTAB Car RRR Abdomen is soft and non tender Incisions are clean and dry Non vaginal bleeding CBC is normal  IMPRESSION: POD #1 LAVH BSO  Doing well  PLAN: Advance diet as tolerated. Discharge tomorrow D/c PCA
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
## 2011-03-29 MED ORDER — IBUPROFEN 600 MG PO TABS: 600.0000 mg | ORAL_TABLET | Freq: Four times a day (QID) | ORAL | Status: AC | PRN

## 2011-03-29 MED ORDER — OXYCODONE-ACETAMINOPHEN 5-325 MG PO TABS: 2.0000 | ORAL_TABLET | ORAL | Status: AC | PRN

## 2011-03-29 NOTE — Progress Notes (Signed)
Pt given discharge instructions. Pt expressed understanding of instruction. Pt escorted to car by nurse tech.

## 2011-03-29 NOTE — Progress Notes (Signed)
2 Days Post-Op Procedure(s): LAPAROSCOPIC ASSISTED VAGINAL HYSTERECTOMY SALPINGO OOPHERECTOMY  Subjective: Patient reports incisional pain, tolerating PO, + flatus and no problems voiding.    Objective: I have reviewed patient's vital signs, intake and output, medications and labs.  General: alert and cooperative Vaginal Bleeding: none Abdomen is soft and non tender Assessment: s/p Procedure(s): LAPAROSCOPIC ASSISTED VAGINAL HYSTERECTOMY SALPINGO OOPHERECTOMY: stable, progressing well and tolerating diet  Plan: Discharge home Rx Ibuprofen, Percocet Followup in 1 week  LOS: 2 days    Dalen Hennessee L 03/29/2011, 8:54 AM

## 2011-03-29 NOTE — Discharge Summary (Signed)
Admission Diagnosis: Pelvic Pain Adenomyosis  Discharge Diagnosis: Same  Hospital Course: 35 year old patient admitted for LAVH and BSO. She had an uneventful surgery. She did very well post op. POD hemoglobin is fine.  She was ambulating, voiding, tolerating pos and had good pain control on the day of discharge. She was given her discharge instructions. She was given an Rx for Ibuprofen and Percocet. Follow up in 1 week

## 2011-03-29 NOTE — Progress Notes (Signed)
Pt discharged via wheel chair.

## 2011-04-09 ENCOUNTER — Ambulatory Visit (INDEPENDENT_AMBULATORY_CARE_PROVIDER_SITE_OTHER): Payer: 59 | Admitting: Licensed Clinical Social Worker

## 2011-04-09 DIAGNOSIS — F3189 Other bipolar disorder: Secondary | ICD-10-CM

## 2011-04-16 ENCOUNTER — Ambulatory Visit (INDEPENDENT_AMBULATORY_CARE_PROVIDER_SITE_OTHER): Payer: 59 | Admitting: Licensed Clinical Social Worker

## 2011-04-16 DIAGNOSIS — F3189 Other bipolar disorder: Secondary | ICD-10-CM

## 2011-04-25 ENCOUNTER — Ambulatory Visit: Payer: 59 | Admitting: Licensed Clinical Social Worker

## 2011-05-23 ENCOUNTER — Ambulatory Visit: Payer: 59 | Attending: Obstetrics and Gynecology | Admitting: Physical Therapy

## 2011-05-23 DIAGNOSIS — M25659 Stiffness of unspecified hip, not elsewhere classified: Secondary | ICD-10-CM | POA: Insufficient documentation

## 2011-05-23 DIAGNOSIS — M2420 Disorder of ligament, unspecified site: Secondary | ICD-10-CM | POA: Insufficient documentation

## 2011-05-23 DIAGNOSIS — M629 Disorder of muscle, unspecified: Secondary | ICD-10-CM | POA: Insufficient documentation

## 2011-05-23 DIAGNOSIS — IMO0001 Reserved for inherently not codable concepts without codable children: Secondary | ICD-10-CM | POA: Insufficient documentation

## 2011-05-30 ENCOUNTER — Ambulatory Visit: Payer: 59 | Admitting: Physical Therapy

## 2011-06-06 ENCOUNTER — Ambulatory Visit: Payer: 59 | Admitting: Physical Therapy

## 2011-06-12 ENCOUNTER — Encounter: Payer: 59 | Admitting: Physical Therapy

## 2011-06-18 ENCOUNTER — Ambulatory Visit: Payer: 59 | Admitting: Physical Therapy

## 2011-06-27 ENCOUNTER — Encounter: Payer: 59 | Admitting: Physical Therapy

## 2011-06-27 ENCOUNTER — Ambulatory Visit: Payer: 59 | Attending: Obstetrics and Gynecology | Admitting: Physical Therapy

## 2011-06-27 DIAGNOSIS — IMO0001 Reserved for inherently not codable concepts without codable children: Secondary | ICD-10-CM | POA: Insufficient documentation

## 2011-06-27 DIAGNOSIS — M629 Disorder of muscle, unspecified: Secondary | ICD-10-CM | POA: Insufficient documentation

## 2011-06-27 DIAGNOSIS — M25659 Stiffness of unspecified hip, not elsewhere classified: Secondary | ICD-10-CM | POA: Insufficient documentation

## 2011-06-27 DIAGNOSIS — M2420 Disorder of ligament, unspecified site: Secondary | ICD-10-CM | POA: Insufficient documentation

## 2011-07-04 ENCOUNTER — Encounter: Payer: 59 | Admitting: Physical Therapy

## 2011-07-04 ENCOUNTER — Ambulatory Visit: Payer: 59 | Admitting: Licensed Clinical Social Worker

## 2011-07-08 ENCOUNTER — Ambulatory Visit (INDEPENDENT_AMBULATORY_CARE_PROVIDER_SITE_OTHER): Payer: 59 | Admitting: Licensed Clinical Social Worker

## 2011-07-08 DIAGNOSIS — F3189 Other bipolar disorder: Secondary | ICD-10-CM

## 2011-07-11 ENCOUNTER — Ambulatory Visit: Payer: 59 | Admitting: Physical Therapy

## 2011-07-18 ENCOUNTER — Ambulatory Visit: Payer: 59 | Admitting: Physical Therapy

## 2011-07-18 ENCOUNTER — Ambulatory Visit: Payer: 59 | Admitting: Licensed Clinical Social Worker

## 2011-07-19 ENCOUNTER — Other Ambulatory Visit (HOSPITAL_COMMUNITY): Payer: Self-pay | Admitting: Sports Medicine

## 2011-07-19 DIAGNOSIS — M545 Low back pain, unspecified: Secondary | ICD-10-CM

## 2011-07-22 ENCOUNTER — Other Ambulatory Visit (HOSPITAL_COMMUNITY): Payer: 59

## 2011-07-23 ENCOUNTER — Ambulatory Visit: Payer: 59 | Admitting: Licensed Clinical Social Worker

## 2011-07-24 ENCOUNTER — Ambulatory Visit (HOSPITAL_COMMUNITY)
Admission: RE | Admit: 2011-07-24 | Discharge: 2011-07-24 | Disposition: A | Discharge status: Home / Self Care | Payer: 59 | Source: Ambulatory Visit | Attending: Sports Medicine | Admitting: Sports Medicine

## 2011-07-24 DIAGNOSIS — R29898 Other symptoms and signs involving the musculoskeletal system: Secondary | ICD-10-CM | POA: Insufficient documentation

## 2011-07-24 DIAGNOSIS — R209 Unspecified disturbances of skin sensation: Secondary | ICD-10-CM | POA: Insufficient documentation

## 2011-07-24 DIAGNOSIS — M545 Low back pain, unspecified: Secondary | ICD-10-CM | POA: Insufficient documentation

## 2011-07-24 DIAGNOSIS — M5126 Other intervertebral disc displacement, lumbar region: Secondary | ICD-10-CM | POA: Insufficient documentation

## 2011-07-25 ENCOUNTER — Ambulatory Visit: Payer: 59 | Attending: Obstetrics and Gynecology | Admitting: Physical Therapy

## 2011-07-25 DIAGNOSIS — M25659 Stiffness of unspecified hip, not elsewhere classified: Secondary | ICD-10-CM | POA: Insufficient documentation

## 2011-07-25 DIAGNOSIS — M2420 Disorder of ligament, unspecified site: Secondary | ICD-10-CM | POA: Insufficient documentation

## 2011-07-25 DIAGNOSIS — IMO0001 Reserved for inherently not codable concepts without codable children: Secondary | ICD-10-CM | POA: Insufficient documentation

## 2011-07-25 DIAGNOSIS — M629 Disorder of muscle, unspecified: Secondary | ICD-10-CM | POA: Insufficient documentation

## 2011-08-01 ENCOUNTER — Encounter: Payer: 59 | Admitting: Physical Therapy

## 2011-08-05 ENCOUNTER — Ambulatory Visit: Payer: 59

## 2011-08-06 ENCOUNTER — Ambulatory Visit: Payer: 59 | Admitting: Physical Therapy

## 2011-08-08 ENCOUNTER — Encounter: Payer: 59 | Admitting: Physical Therapy

## 2011-08-13 ENCOUNTER — Ambulatory Visit: Payer: 59 | Attending: Sports Medicine | Admitting: Physical Therapy

## 2011-08-13 ENCOUNTER — Encounter: Payer: 59 | Admitting: Physical Therapy

## 2011-08-13 DIAGNOSIS — M545 Low back pain, unspecified: Secondary | ICD-10-CM | POA: Insufficient documentation

## 2011-08-13 DIAGNOSIS — M2569 Stiffness of other specified joint, not elsewhere classified: Secondary | ICD-10-CM | POA: Insufficient documentation

## 2011-08-13 DIAGNOSIS — IMO0001 Reserved for inherently not codable concepts without codable children: Secondary | ICD-10-CM | POA: Insufficient documentation

## 2011-08-15 ENCOUNTER — Ambulatory Visit: Payer: 59 | Admitting: Physical Therapy

## 2011-08-15 ENCOUNTER — Encounter: Payer: 59 | Admitting: Physical Therapy

## 2011-08-19 ENCOUNTER — Ambulatory Visit (INDEPENDENT_AMBULATORY_CARE_PROVIDER_SITE_OTHER): Payer: Self-pay | Admitting: Licensed Clinical Social Worker

## 2011-08-19 DIAGNOSIS — F3189 Other bipolar disorder: Secondary | ICD-10-CM

## 2011-08-21 ENCOUNTER — Encounter: Payer: 59 | Admitting: Physical Therapy

## 2011-08-21 ENCOUNTER — Ambulatory Visit: Payer: 59 | Attending: Obstetrics and Gynecology | Admitting: Physical Therapy

## 2011-08-21 DIAGNOSIS — M629 Disorder of muscle, unspecified: Secondary | ICD-10-CM | POA: Insufficient documentation

## 2011-08-21 DIAGNOSIS — M25659 Stiffness of unspecified hip, not elsewhere classified: Secondary | ICD-10-CM | POA: Insufficient documentation

## 2011-08-21 DIAGNOSIS — IMO0001 Reserved for inherently not codable concepts without codable children: Secondary | ICD-10-CM | POA: Insufficient documentation

## 2011-08-21 DIAGNOSIS — M2420 Disorder of ligament, unspecified site: Secondary | ICD-10-CM | POA: Insufficient documentation

## 2011-08-22 ENCOUNTER — Ambulatory Visit: Payer: 59 | Admitting: Physical Therapy

## 2011-08-27 ENCOUNTER — Ambulatory Visit: Payer: 59 | Attending: Sports Medicine | Admitting: Physical Therapy

## 2011-08-27 DIAGNOSIS — M545 Low back pain, unspecified: Secondary | ICD-10-CM | POA: Insufficient documentation

## 2011-08-27 DIAGNOSIS — IMO0001 Reserved for inherently not codable concepts without codable children: Secondary | ICD-10-CM | POA: Insufficient documentation

## 2011-08-27 DIAGNOSIS — M2569 Stiffness of other specified joint, not elsewhere classified: Secondary | ICD-10-CM | POA: Insufficient documentation

## 2011-08-30 ENCOUNTER — Ambulatory Visit: Payer: 59 | Admitting: Physical Therapy

## 2011-09-03 ENCOUNTER — Ambulatory Visit: Payer: 59 | Admitting: Physical Therapy

## 2011-09-05 ENCOUNTER — Ambulatory Visit: Payer: 59 | Admitting: Physical Therapy

## 2011-09-09 ENCOUNTER — Ambulatory Visit: Payer: 59 | Admitting: Licensed Clinical Social Worker

## 2011-09-10 ENCOUNTER — Ambulatory Visit: Payer: 59 | Admitting: Physical Therapy

## 2011-09-12 ENCOUNTER — Encounter: Payer: 59 | Admitting: Physical Therapy

## 2011-09-30 ENCOUNTER — Ambulatory Visit: Payer: 59 | Admitting: Family

## 2011-11-01 ENCOUNTER — Ambulatory Visit: Payer: Self-pay | Admitting: Licensed Clinical Social Worker

## 2011-11-08 ENCOUNTER — Ambulatory Visit (INDEPENDENT_AMBULATORY_CARE_PROVIDER_SITE_OTHER): Payer: Self-pay | Admitting: Licensed Clinical Social Worker

## 2011-11-08 DIAGNOSIS — F3189 Other bipolar disorder: Secondary | ICD-10-CM

## 2011-12-24 ENCOUNTER — Emergency Department (HOSPITAL_BASED_OUTPATIENT_CLINIC_OR_DEPARTMENT_OTHER): Payer: 59

## 2011-12-24 ENCOUNTER — Encounter (HOSPITAL_BASED_OUTPATIENT_CLINIC_OR_DEPARTMENT_OTHER): Payer: Self-pay

## 2011-12-24 ENCOUNTER — Emergency Department (HOSPITAL_BASED_OUTPATIENT_CLINIC_OR_DEPARTMENT_OTHER)
Admission: EM | Admit: 2011-12-24 | Discharge: 2011-12-24 | Disposition: A | Discharge status: Home / Self Care | Payer: 59 | Attending: Emergency Medicine | Admitting: Emergency Medicine

## 2011-12-24 DIAGNOSIS — E785 Hyperlipidemia, unspecified: Secondary | ICD-10-CM | POA: Insufficient documentation

## 2011-12-24 DIAGNOSIS — E039 Hypothyroidism, unspecified: Secondary | ICD-10-CM | POA: Insufficient documentation

## 2011-12-24 DIAGNOSIS — F341 Dysthymic disorder: Secondary | ICD-10-CM | POA: Insufficient documentation

## 2011-12-24 DIAGNOSIS — R079 Chest pain, unspecified: Secondary | ICD-10-CM | POA: Insufficient documentation

## 2011-12-24 DIAGNOSIS — Z8739 Personal history of other diseases of the musculoskeletal system and connective tissue: Secondary | ICD-10-CM | POA: Insufficient documentation

## 2011-12-24 DIAGNOSIS — Z79899 Other long term (current) drug therapy: Secondary | ICD-10-CM | POA: Insufficient documentation

## 2011-12-24 DIAGNOSIS — R0602 Shortness of breath: Secondary | ICD-10-CM | POA: Insufficient documentation

## 2011-12-24 DIAGNOSIS — I10 Essential (primary) hypertension: Secondary | ICD-10-CM | POA: Insufficient documentation

## 2011-12-24 HISTORY — DX: Hyperlipidemia, unspecified: E78.5

## 2011-12-24 LAB — COMPREHENSIVE METABOLIC PANEL
ALT: 12 U/L (ref 0–35)
AST: 16 U/L (ref 0–37)
Albumin: 3.8 g/dL (ref 3.5–5.2)
Alkaline Phosphatase: 100 U/L (ref 39–117)
BUN: 7 mg/dL (ref 6–23)
CO2: 28 mEq/L (ref 19–32)
Calcium: 9.9 mg/dL (ref 8.4–10.5)
Chloride: 101 mEq/L (ref 96–112)
Creatinine, Ser: 0.9 mg/dL (ref 0.50–1.10)
GFR calc Af Amer: >90 mL/min (ref >90)
GFR calc non Af Amer: 81 mL/min — ABNORMAL LOW (ref >90)
Glucose, Bld: 103 mg/dL — ABNORMAL HIGH (ref 70–99)
Potassium: 3.7 mEq/L (ref 3.5–5.1)
Sodium: 138 mEq/L (ref 135–145)
Total Bilirubin: 0.3 mg/dL (ref 0.3–1.2)
Total Protein: 6.9 g/dL (ref 6.0–8.3)

## 2011-12-24 LAB — CBC WITH DIFFERENTIAL/PLATELET
Basophils Absolute: 0 10*3/uL (ref 0.0–0.1)
Basophils Relative: 0 % (ref 0–1)
Eosinophils Absolute: 0.4 10*3/uL (ref 0.0–0.7)
Eosinophils Relative: 5 % (ref 0–5)
HCT: 38.8 % (ref 36.0–46.0)
Hemoglobin: 13.8 g/dL (ref 12.0–15.0)
Lymphocytes Relative: 30 % (ref 12–46)
Lymphs Abs: 2.4 10*3/uL (ref 0.7–4.0)
MCH: 30 pg (ref 26.0–34.0)
MCHC: 35.6 g/dL (ref 30.0–36.0)
MCV: 84.3 fL (ref 78.0–100.0)
Monocytes Absolute: 0.6 10*3/uL (ref 0.1–1.0)
Monocytes Relative: 7 % (ref 3–12)
Neutro Abs: 4.7 10*3/uL (ref 1.7–7.7)
Neutrophils Relative %: 59 % (ref 43–77)
Platelets: 236 10*3/uL (ref 150–400)
RBC: 4.6 MIL/uL (ref 3.87–5.11)
RDW: 12.5 % (ref 11.5–15.5)
WBC: 8 10*3/uL (ref 4.0–10.5)

## 2011-12-24 LAB — CARDIAC PANEL(CRET KIN+CKTOT+MB+TROPI)
CK, MB: 1.9 ng/mL (ref 0.3–4.0)
Relative Index: INVALID (ref 0.0–2.5)
Total CK: 73 U/L (ref 7–177)
Troponin I: <0.3 ng/mL (ref <0.30)

## 2011-12-24 LAB — TROPONIN I: Troponin I: <0.3 ng/mL (ref <0.30)

## 2011-12-24 LAB — LIPASE, BLOOD: Lipase: 46 U/L (ref 11–59)

## 2011-12-24 MED ORDER — GI COCKTAIL ~~LOC~~
30.0000 mL | Freq: Once | ORAL | Status: AC
  Administered 2011-12-24: 30 mL via ORAL
  Filled 2011-12-24: qty 30

## 2011-12-24 MED ORDER — LANSOPRAZOLE 30 MG PO CPDR: 30.0000 mg | DELAYED_RELEASE_CAPSULE | Freq: Every day | ORAL | Status: DC

## 2011-12-24 MED ORDER — IOHEXOL 350 MG/ML SOLN
80.0000 mL | Freq: Once | INTRAVENOUS | Status: AC | PRN
  Administered 2011-12-24: 80 mL via INTRAVENOUS

## 2011-12-24 NOTE — ED Notes (Signed)
New orders received for ultrasound.  Pt informed of plan of care.

## 2011-12-24 NOTE — ED Notes (Signed)
Pt also reports bilateral leg pain x 2 days.

## 2011-12-24 NOTE — ED Notes (Signed)
Pt returned from CT °

## 2011-12-24 NOTE — ED Notes (Signed)
MD at bedside. 

## 2011-12-24 NOTE — ED Notes (Signed)
Patient transported to CT 

## 2011-12-24 NOTE — ED Provider Notes (Addendum)
History     CSN: [id number]  Arrival date & time 12/24/11  1478   First MD Initiated Contact with Patient 12/24/11 [phone]      Chief Complaint  Patient presents with  . Chest Pain  . Shortness of Breath    (Consider location/radiation/quality/duration/timing/severity/associated sxs/prior treatment) HPI Comments: Patient presents with a two-day history of constant chest pain which she describes as a tightness across her chest with some sharp pains behind her shoulder blade. She does have associated shortness of breath. The pain is worse with breathing. She denies any nausea or vomiting. Denies any diaphoresis. She's had some sharp pains in both of her legs but no pedal edema. No cough congestion. No fevers or chills. She has a history of similar or shortness of breath associated with a pulmonary embolus in 2003 this was related to protein S deficiency. She's also had a history of mitral valve prolapse but no other known heart problems. There is no family history of early heart disease in her mom dad or siblings. She does have a history of hyperlipidemia and hypertension. She states that her discomfort started as an indigestion take pain with burning in her epigastric area and subsequently radiated across her chest.  Patient is a 36 y.o. female presenting with chest pain and shortness of breath. The history is provided by the patient.  Chest Pain Primary symptoms include shortness of breath. Pertinent negatives for primary symptoms include no fever, no fatigue, no cough, no abdominal pain, no nausea, no vomiting and no dizziness.  Pertinent negatives for associated symptoms include no diaphoresis, no numbness and no weakness.    Shortness of Breath  Associated symptoms include chest pain and shortness of breath. Pertinent negatives include no fever, no rhinorrhea and no cough.    Past Medical History  Diagnosis Date  . Hypertension   . MVP (mitral valve prolapse)     echo per dr ava  .  Anxiety   . Depression   . Headache   . Protein S deficiency 2003    dvt/pulmonary embolus-on coumadin for 6 months then off-Sees [facility]  . Arthritis     ruptured lumbar disc-careful with positioning  . Hypothyroidism   . Blood dyscrasia     protein s deficiency-no treatment since 2006  . Hyperlipemia     Past Surgical History  Procedure Date  . Vaginal reconstructive surgery 2001  . Cesarean section   . Tonsillectomy 92  . Laparoscopic assisted vaginal hysterectomy 03/27/2011    Procedure: LAPAROSCOPIC ASSISTED VAGINAL HYSTERECTOMY;  Surgeon: Jeani Hawking, MD;  Location: [facility];  Service: Gynecology;  Laterality: N/A;  . Salpingoophorectomy 03/27/2011    Procedure: SALPINGO OOPHERECTOMY;  Surgeon: Jeani Hawking, MD;  Location: [facility];  Service: Gynecology;  Laterality: Bilateral;    No family history on file.  History  Substance Use Topics  . Smoking status: Former Smoker    Quit date: 03/18/2004  . Smokeless tobacco: Not on file  . Alcohol Use: No    OB History    Grav Para Term Preterm Abortions TAB SAB Ect Mult Living                  Review of Systems  Constitutional: Negative for fever, chills, diaphoresis and fatigue.  HENT: Negative for congestion, rhinorrhea and sneezing.   Eyes: Negative.   Respiratory: Positive for shortness of breath. Negative for cough and chest tightness.   Cardiovascular: Positive for chest pain. Negative for leg swelling.  Gastrointestinal: Negative for nausea, vomiting, abdominal pain, diarrhea and blood in stool.  Genitourinary: Negative for frequency, hematuria, flank pain and difficulty urinating.  Musculoskeletal: Negative for back pain and arthralgias.  Skin: Negative for rash.  Neurological: Negative for dizziness, speech difficulty, weakness, numbness and headaches.    Allergies  Review of patient's allergies indicates no known allergies.  Home Medications   Current Outpatient Rx  Name Route Sig  Dispense Refill  . AMLODIPINE BESYLATE 5 MG PO TABS Oral Take 5 mg by mouth daily.     Marland Kitchen CALCIUM CARBONATE 600 MG PO TABS Oral Take 600 mg by mouth daily.    Marland Kitchen CLONAZEPAM 0.5 MG PO TABS Oral Take 1 mg by mouth daily.     . DESVENLAFAXINE SUCCINATE ER 100 MG PO TB24 Oral Take 100 mg by mouth daily.     Marland Kitchen EZETIMIBE 10 MG PO TABS Oral Take 10 mg by mouth daily.     . IBUPROFEN 200 MG PO TABS Oral Take 600 mg by mouth every 6 (six) hours as needed. For pain.    Marland Kitchen LEVOTHYROXINE SODIUM 175 MCG PO TABS Oral Take 175 mcg by mouth daily.     . MULTI-VITAMIN/MINERALS PO TABS Oral Take 1 tablet by mouth daily.     Marland Kitchen ROSUVASTATIN CALCIUM 10 MG PO TABS Oral Take 10 mg by mouth daily.      Marland Kitchen VITAMIN C 500 MG PO TABS Oral Take 500 mg by mouth daily.     Marland Kitchen VITAMIN D (ERGOCALCIFEROL) 50000 UNITS PO CAPS Oral Take 50,000 Units by mouth every 7 (seven) days. tuesdays    . LANSOPRAZOLE 30 MG PO CPDR Oral Take 1 capsule (30 mg total) by mouth daily. 30 capsule 0    BP 125/86  Pulse 58  Temp 97.6 F (36.4 C) (Oral)  Resp 16  Ht 5\' 9"  (1.753 m)  Wt 250 lb (113.399 kg)  BMI 36.92 kg/m2  SpO2 97%  LMP 03/19/2011  Physical Exam  Constitutional: She is oriented to person, place, and time. She appears well-developed and well-nourished.  HENT:  Head: Normocephalic and atraumatic.  Eyes: Pupils are equal, round, and reactive to light.  Neck: Normal range of motion. Neck supple.  Cardiovascular: Normal rate, regular rhythm and normal heart sounds.   Pulmonary/Chest: Effort normal and breath sounds normal. No respiratory distress. She has no wheezes. She has no rales. She exhibits no tenderness.  Abdominal: Soft. Bowel sounds are normal. There is no tenderness. There is no rebound and no guarding.  Musculoskeletal: Normal range of motion. She exhibits tenderness (Mild tenderness of both calves). She exhibits no edema.  Lymphadenopathy:    She has no cervical adenopathy.  Neurological: She is alert and oriented  to person, place, and time.  Skin: Skin is warm and dry. No rash noted.  Psychiatric: She has a normal mood and affect.    ED Course  Procedures (including critical care time)  Results for orders placed during the hospital encounter of 12/24/11  CBC WITH DIFFERENTIAL      Component Value Range   WBC 8.0  4.0 - 10.5 K/uL   RBC 4.60  3.87 - 5.11 MIL/uL   Hemoglobin 13.8  12.0 - 15.0 g/dL   HCT 11.9  14.7 - 82.9 %   MCV 84.3  78.0 - 100.0 fL   MCH 30.0  26.0 - 34.0 pg   MCHC 35.6  30.0 - 36.0 g/dL   RDW 56.2  13.0 - 86.5 %  Platelets 236  150 - 400 K/uL   Neutrophils Relative 59  43 - 77 %   Neutro Abs 4.7  1.7 - 7.7 K/uL   Lymphocytes Relative 30  12 - 46 %   Lymphs Abs 2.4  0.7 - 4.0 K/uL   Monocytes Relative 7  3 - 12 %   Monocytes Absolute 0.6  0.1 - 1.0 K/uL   Eosinophils Relative 5  0 - 5 %   Eosinophils Absolute 0.4  0.0 - 0.7 K/uL   Basophils Relative 0  0 - 1 %   Basophils Absolute 0.0  0.0 - 0.1 K/uL  COMPREHENSIVE METABOLIC PANEL      Component Value Range   Sodium 138  135 - 145 mEq/L   Potassium 3.7  3.5 - 5.1 mEq/L   Chloride 101  96 - 112 mEq/L   CO2 28  19 - 32 mEq/L   Glucose, Bld 103 (*) 70 - 99 mg/dL   BUN 7  6 - 23 mg/dL   Creatinine, Ser 1.61  0.50 - 1.10 mg/dL   Calcium 9.9  8.4 - 09.6 mg/dL   Total Protein 6.9  6.0 - 8.3 g/dL   Albumin 3.8  3.5 - 5.2 g/dL   AST 16  0 - 37 U/L   ALT 12  0 - 35 U/L   Alkaline Phosphatase 100  39 - 117 U/L   Total Bilirubin 0.3  0.3 - 1.2 mg/dL   GFR calc non Af Amer 81 (*) >90 mL/min   GFR calc Af Amer >90  >90 mL/min  LIPASE, BLOOD      Component Value Range   Lipase 46  11 - 59 U/L  CARDIAC PANEL(CRET KIN+CKTOT+MB+TROPI)      Component Value Range   Total CK 73  7 - 177 U/L   CK, MB 1.9  0.3 - 4.0 ng/mL   Troponin I <0.30  <0.30 ng/mL   Relative Index RELATIVE INDEX IS INVALID  0.0 - 2.5  TROPONIN I      Component Value Range   Troponin I <0.30  <0.30 ng/mL   Ct Angio Chest Pe W/cm &/or Wo  Cm  12/24/2011  *RADIOLOGY REPORT*  Clinical Data: Chest pain, history of pulmonary embolism.  CT ANGIOGRAPHY CHEST  Technique:  Multidetector CT imaging of the chest using the standard protocol during bolus administration of intravenous contrast. Multiplanar reconstructed images including MIPs were obtained and reviewed to evaluate the vascular anatomy.  Contrast: 80mL OMNIPAQUE IOHEXOL 350 MG/ML SOLN  Comparison: CT thorax 03/03/2009  Findings: There are no filling defects within the pulmonary arteries to suggest acute pulmonary embolism.  No acute findings of the aorta great vessels.  No pericardial fluid.  No acute pulmonary parenchymal findings.  No axillary or supraclavicular adenopathy.  No mediastinal hilar adenopathy.  Esophagus is normal.  Limited view of the upper abdomen is unremarkable.  Limited view of the skeleton is unremarkable.  IMPRESSION: No evidence of pulmonary embolism.  Original Report Authenticated By: Genevive Bi, M.D.   US Venous Img Lower Bilateral  12/24/2011  *RADIOLOGY REPORT*  Clinical Data: Bilateral leg pain, left greater than right, shortness of breath, chest pain, history of left lower extremity DVT and pulmonary embolism.  BILATERAL LOWER EXTREMITY VENOUS DUPLEX ULTRASOUND  Technique:  Gray-scale sonography with graded compression, as well as color Doppler and duplex ultrasound, were performed to evaluate the deep venous system of both lower extremities from the level of the common femoral vein through the  popliteal and proximal calf veins.  Spectral Doppler was utilized to evaluate flow at rest and with distal augmentation maneuvers.  Comparison:  None.  Findings:  Normal compressibility of bilateral common femoral, superficial femoral, and popliteal veins is demonstrated, as well as the visualized proximal calf veins.  No filling defects to suggest DVT on grayscale or color Doppler imaging.  Doppler waveforms show normal direction of venous flow, normal respiratory  phasicity and response to augmentation.  Additional provided gray scale images of the patient's palpable area within the superior medial aspect of the upper thigh demonstrates several small hyperechoic/nearly isoechoic nodules within the subcutaneous fat with dominant nodule measuring approximately 1.2 x 0.7 cm (image 52).  IMPRESSION: 1.  No evidence of deep vein thrombosis within either lower extremity.  2.  Additional gray scale imaging of patient's palpable area of concern demonstrates several small nearly isoechoic nodules within the subcutaneous tissues, nonspecific but favored to represent small lipomas.  Original Report Authenticated By: Waynard Reeds, M.D.      Date: 12/24/2011  Rate: 76  Rhythm: normal sinus rhythm  QRS Axis: normal  Intervals: normal  ST/T Wave abnormalities: nonspecific ST/T changes  Conduction Disutrbances:none  Narrative Interpretation:   Old EKG Reviewed: unchanged    1. Chest pain       MDM  Patient does have some risk factors for coronary artery disease including hypertension hyperlipidemia but she has no close family members with early heart disease. The pain has been constant for 2 days and she has negative troponin. I have a low suspicion for acute coronary syndrome. I feel that most likely this is GI related. I did discuss the findings with the patient and offered her admission for further cardiac evaluation or advised her if she did not want to be admitted, she would need close f/u with her PMD.  She would feel more comfortable with being discharged and following up with Dr. Felipa Eth. I did discuss this with the physician on call for [facility] and he advised that they can arrange for the patient have close followup in her office for further evaluation. I will go ahead and start her on Prevacid to see if that helps with the symptoms. Patient is agreeable to return if her symptoms worsen.  Pt with a HEART score of 2.   Rolan Bucco,  MD 12/24/11 1535  Rolan Bucco, MD 12/24/11 1537

## 2011-12-24 NOTE — ED Notes (Signed)
Pt reports a 2 day history of chest pain and SHOB.

## 2012-03-27 ENCOUNTER — Ambulatory Visit (INDEPENDENT_AMBULATORY_CARE_PROVIDER_SITE_OTHER): Payer: 59 | Admitting: Emergency Medicine

## 2012-03-27 ENCOUNTER — Ambulatory Visit: Payer: 59

## 2012-03-27 VITALS — BP 140/91 | HR 99 | Temp 98.7°F | Resp 16 | Ht 69.0 in | Wt 252.4 lb

## 2012-03-27 DIAGNOSIS — M79673 Pain in unspecified foot: Secondary | ICD-10-CM

## 2012-03-27 DIAGNOSIS — S9030XA Contusion of unspecified foot, initial encounter: Secondary | ICD-10-CM

## 2012-03-27 MED ORDER — TRAMADOL HCL 50 MG PO TABS: 50.0000 mg | ORAL_TABLET | Freq: Four times a day (QID) | ORAL | Status: DC | PRN

## 2012-03-27 MED ORDER — NAPROXEN SODIUM 550 MG PO TABS: 550.0000 mg | ORAL_TABLET | Freq: Two times a day (BID) | ORAL | Status: DC

## 2012-03-27 NOTE — Progress Notes (Signed)
[facility] [address] [place] [address] [phone]  Date:  03/27/2012   Name:  Andrea [name]   DOB:  02-14-1976   MRN:  [id number]  PCP:  No primary provider on file.    Chief Complaint: Foot Pain   History of Present Illness:  Andrea [name] is a 36 y.o. very pleasant female patient who presents with the following:  Jumped from a hay wagon on 10/25 and landed hard on her right foot and less so on her left foot.  Has pain with ambulation. No deformity or ecchymosis.  Pain mostly in forefoot and heel in both feet.  Pain worse since injury  Patient Active Problem List  Diagnosis  . CANDIDIASIS, VAGINAL  . HYPOTHYROIDISM  . HEMORRHOIDS-EXTERNAL  . GERD  . IBS  . ANAL FISSURE  . RECTAL BLEEDING  . ABDOMINAL PAIN-LUQ  . NONSPECIFIC ABN FINDING RAD & OTH EXAM GI TRACT  . TONSILLECTOMY, HX OF    Past Medical History  Diagnosis Date  . Hypertension   . MVP (mitral valve prolapse)     echo per dr ava  . Anxiety   . Depression   . Headache   . Protein S deficiency 2003    dvt/pulmonary embolus-on coumadin for 6 months then off-Sees [facility]  . Arthritis     ruptured lumbar disc-careful with positioning  . Hypothyroidism   . Blood dyscrasia     protein s deficiency-no treatment since 2006  . Hyperlipemia     Past Surgical History  Procedure Date  . Vaginal reconstructive surgery 2001  . Cesarean section   . Tonsillectomy 92  . Laparoscopic assisted vaginal hysterectomy 03/27/2011    Procedure: LAPAROSCOPIC ASSISTED VAGINAL HYSTERECTOMY;  Surgeon: Jeani Hawking, MD;  Location: [facility];  Service: Gynecology;  Laterality: N/A;  . Salpingoophorectomy 03/27/2011    Procedure: SALPINGO OOPHERECTOMY;  Surgeon: Jeani Hawking, MD;  Location: [facility];  Service: Gynecology;  Laterality: Bilateral;    History  Substance Use Topics  . Smoking status: Former Smoker    Quit date: 03/18/2004  . Smokeless tobacco: Not on  file  . Alcohol Use: No    No family history on file.  No Known Allergies  Medication list has been reviewed and updated.  Current Outpatient Prescriptions on File Prior to Visit  Medication Sig Dispense Refill  . amLODipine (NORVASC) 5 MG tablet Take 5 mg by mouth daily.       . calcium carbonate (OS-CAL) 600 MG TABS Take 600 mg by mouth daily.      . clonazePAM (KLONOPIN) 0.5 MG tablet Take 1 mg by mouth daily.       Marland Kitchen desvenlafaxine (PRISTIQ) 100 MG 24 hr tablet Take 100 mg by mouth daily.       Marland Kitchen ezetimibe (ZETIA) 10 MG tablet Take 10 mg by mouth daily.       Marland Kitchen ibuprofen (ADVIL,MOTRIN) 200 MG tablet Take 600 mg by mouth every 6 (six) hours as needed. For pain.      Marland Kitchen lansoprazole (PREVACID) 30 MG capsule Take 1 capsule (30 mg total) by mouth daily.  30 capsule  0  . levothyroxine (SYNTHROID, LEVOTHROID) 175 MCG tablet Take 175 mcg by mouth daily.       . Multiple Vitamins-Minerals (MULTIVITAMIN WITH MINERALS) tablet Take 1 tablet by mouth daily.       . rosuvastatin (CRESTOR) 10 MG tablet Take 10 mg by mouth daily.        Marland Kitchen  traZODone (DESYREL) 50 MG tablet Take 50 mg by mouth at bedtime.      . vitamin C (ASCORBIC ACID) 500 MG tablet Take 500 mg by mouth daily.       . Vitamin D, Ergocalciferol, (DRISDOL) 50000 UNITS CAPS Take 50,000 Units by mouth every 7 (seven) days. tuesdays        Review of Systems:  As per HPI, otherwise negative.    Physical Examination: Filed Vitals:   03/27/12 1622  BP: 140/91  Pulse: 99  Temp: 98.7 F (37.1 C)  Resp: 16   Filed Vitals:   03/27/12 1622  Height: 5\' 9"  (1.753 m)  Weight: 252 lb 6.4 oz (114.488 kg)   Body mass index is 37.27 kg/(m^2). Ideal Body Weight: Weight in (lb) to have BMI = 25: 168.9    GEN: WDWN, NAD, Non-toxic, Alert & Oriented x 3 HEENT: Atraumatic, Normocephalic.  Ears and Nose: No external deformity. EXTR: No clubbing/cyanosis/edema NEURO: Normal gait.  PSYCH: Normally interactive. Conversant. Not  depressed or anxious appearing.  Calm demeanor.  R Foot:  Tender heel and forefoot.  No deformity or ecchymosis or crepitus L Foot:  Tender heel and forefoot.  No deformity or ecchymosis or crepitus  Assessment and Plan: Contusion feet Anaprox ds Follow up as needed  Carmelina Dane, MD  [facility] reading (PRIMARY) by  Dr. Dareen Piano.  Negative right foot.  [facility] reading (PRIMARY) by  Dr. Dareen Piano.  Negative left foot.

## 2012-04-04 NOTE — Progress Notes (Signed)
Reviewed and agree.

## 2012-08-31 ENCOUNTER — Encounter: Payer: Self-pay | Admitting: Internal Medicine

## 2012-09-16 ENCOUNTER — Ambulatory Visit (INDEPENDENT_AMBULATORY_CARE_PROVIDER_SITE_OTHER): Payer: 59 | Admitting: Gastroenterology

## 2012-09-16 ENCOUNTER — Encounter: Payer: Self-pay | Admitting: Gastroenterology

## 2012-09-16 ENCOUNTER — Telehealth: Payer: Self-pay | Admitting: Internal Medicine

## 2012-09-16 ENCOUNTER — Other Ambulatory Visit (INDEPENDENT_AMBULATORY_CARE_PROVIDER_SITE_OTHER): Payer: 59

## 2012-09-16 VITALS — BP 112/82 | HR 80 | Ht 68.0 in | Wt 241.4 lb

## 2012-09-16 DIAGNOSIS — R110 Nausea: Secondary | ICD-10-CM

## 2012-09-16 DIAGNOSIS — R197 Diarrhea, unspecified: Secondary | ICD-10-CM

## 2012-09-16 DIAGNOSIS — R109 Unspecified abdominal pain: Secondary | ICD-10-CM

## 2012-09-16 LAB — CBC WITH DIFFERENTIAL/PLATELET
Basophils Absolute: 0 10*3/uL (ref 0.0–0.1)
Basophils Relative: 0.3 % (ref 0.0–3.0)
Eosinophils Absolute: 0.3 10*3/uL (ref 0.0–0.7)
Eosinophils Relative: 4 % (ref 0.0–5.0)
HCT: 41.5 % (ref 36.0–46.0)
Hemoglobin: 14.3 g/dL (ref 12.0–15.0)
Lymphocytes Relative: 39.8 % (ref 12.0–46.0)
Lymphs Abs: 3.3 10*3/uL (ref 0.7–4.0)
MCHC: 34.5 g/dL (ref 30.0–36.0)
MCV: 85.2 fl (ref 78.0–100.0)
Monocytes Absolute: 0.5 10*3/uL (ref 0.1–1.0)
Monocytes Relative: 6.3 % (ref 3.0–12.0)
Neutro Abs: 4.1 10*3/uL (ref 1.4–7.7)
Neutrophils Relative %: 49.6 % (ref 43.0–77.0)
Platelets: 266 10*3/uL (ref 150.0–400.0)
RBC: 4.87 Mil/uL (ref 3.87–5.11)
RDW: 12.4 % (ref 11.5–14.6)
WBC: 8.3 10*3/uL (ref 4.5–10.5)

## 2012-09-16 LAB — COMPREHENSIVE METABOLIC PANEL
ALT: 17 U/L (ref 0–35)
AST: 17 U/L (ref 0–37)
Albumin: 3.9 g/dL (ref 3.5–5.2)
Alkaline Phosphatase: 87 U/L (ref 39–117)
BUN: 13 mg/dL (ref 6–23)
CO2: 30 mEq/L (ref 19–32)
Calcium: 9.6 mg/dL (ref 8.4–10.5)
Chloride: 105 mEq/L (ref 96–112)
Creatinine, Ser: 0.8 mg/dL (ref 0.4–1.2)
GFR: 86.33 mL/min (ref >60.00)
Glucose, Bld: 102 mg/dL — ABNORMAL HIGH (ref 70–99)
Potassium: 4.1 mEq/L (ref 3.5–5.1)
Sodium: 140 mEq/L (ref 135–145)
Total Bilirubin: 0.6 mg/dL (ref 0.3–1.2)
Total Protein: 7 g/dL (ref 6.0–8.3)

## 2012-09-16 MED ORDER — ALIGN PO CAPS: 1.0000 | ORAL_CAPSULE | Freq: Every day | ORAL | Status: DC

## 2012-09-16 NOTE — Telephone Encounter (Signed)
Pt states she has been nauseated and had diarrhea for over a week. Pt did take Flagyl several months ago. 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 Pt scheduled to see Doug Sou PA today at 3:30pm. Pt aware of appt date and time. Pt wanted to keep appt scheduled to see Dr. [name] later this month. Appt left as scheduled.

## 2012-09-16 NOTE — Progress Notes (Signed)
09/16/2012 Andrea [name] [id number] 11-12-1975   History of Present Illness:  Patient is a pleasant 37 year old female who is a patient of Dr. Lamar Sprinkles.  She has a history of IBS, anxiety, depression, etc.  She presents to our office today with complaints of diarrhea, abdominal cramping, nausea and bloating that began about 10 days ago.  She is having diarrhea 4-6 times a day and it has been waking her at night on occasion.  She has been unable to eat much.  She has been cleaning houses for some extra money and is concerned that she picked up something.  Says that her stool smells very foul.  Says that there is no new or extra stresses in her life right now that she can contribute the diarrhea/a flare of her IBS to.  Says that her IBS has been under pretty good control for a while.  Was on several courses of antibiotics prior to January but it was always flagyl and no others recently.    Current Medications, Allergies, Past Medical History, Past Surgical History, Family History and Social History were reviewed in [employer] record.   Physical Exam: BP 112/82  Pulse 80  Ht 5\' 8"  (1.727 m)  Wt 241 lb 6 oz (109.487 kg)  BMI 36.71 kg/m2  LMP 03/19/2011 General: Well developed, white female in no acute distress Head: Normocephalic and atraumatic Eyes:  Sclerae anicteric, conjunctiva pink  Ears: Normal auditory acuity Lungs: Clear throughout to auscultation Heart: Regular rate and rhythm Abdomen: Soft, non-distended. No masses, no hepatomegaly. Normal bowel sounds.  Mild diffuse TTP>in the lower abdomen without R/R/G. Musculoskeletal: Symmetrical with no gross deformities  Extremities: No edema  Neurological: Alert oriented x 4, grossly nonfocal Psychological:  Alert and cooperative. Normal mood and affect  Assessment and Recommendations: -Diarrhea with nausea and abdominal pain:  Patient concerned about C diff infection.  Will check stool studies for C diff,  culture, and O&P.  Check CBC and CMP.  Will try probiotics daily as well; samples of align given.

## 2012-09-16 NOTE — Patient Instructions (Addendum)
Your physician has requested that you go to the basement for the following lab work before leaving today: CBC/diff, CMET, Stool culture, C. Diff., Stool for O & P  We have given you samples of Align. This puts good bacteria back into your colon. You should take 1 capsule by mouth once daily. If this works well for you, it can be purchased over the counter.   Thank you for choosing me and [facility].  Doug Sou, PA-C

## 2012-09-17 ENCOUNTER — Telehealth: Payer: Self-pay | Admitting: Gastroenterology

## 2012-09-17 ENCOUNTER — Other Ambulatory Visit: Payer: 59

## 2012-09-17 DIAGNOSIS — R109 Unspecified abdominal pain: Secondary | ICD-10-CM

## 2012-09-17 DIAGNOSIS — R110 Nausea: Secondary | ICD-10-CM

## 2012-09-17 DIAGNOSIS — R197 Diarrhea, unspecified: Secondary | ICD-10-CM

## 2012-09-17 NOTE — Progress Notes (Signed)
Agree with initial assessment and plans 

## 2012-09-17 NOTE — Telephone Encounter (Signed)
Spoke with pt and let her know that the results are not back yet. 

## 2012-09-18 LAB — CLOSTRIDIUM DIFFICILE BY PCR: Toxigenic C. Difficile by PCR: NOT DETECTED

## 2012-09-21 LAB — OVA AND PARASITE SCREEN: OP: NONE SEEN

## 2012-09-21 LAB — STOOL CULTURE

## 2012-09-22 ENCOUNTER — Telehealth: Payer: Self-pay | Admitting: Internal Medicine

## 2012-09-22 NOTE — Telephone Encounter (Signed)
Left a message for patient to call me. 

## 2012-09-22 NOTE — Telephone Encounter (Signed)
Spoke with patient and gave her recommendation by Doug Sou, PA to keep Ov with Dr. [name].

## 2012-09-30 ENCOUNTER — Encounter: Payer: Self-pay | Admitting: Internal Medicine

## 2012-09-30 ENCOUNTER — Ambulatory Visit (INDEPENDENT_AMBULATORY_CARE_PROVIDER_SITE_OTHER): Payer: 59 | Admitting: Internal Medicine

## 2012-09-30 VITALS — BP 120/80 | HR 81 | Ht 68.0 in | Wt 240.0 lb

## 2012-09-30 DIAGNOSIS — K6289 Other specified diseases of anus and rectum: Secondary | ICD-10-CM

## 2012-09-30 DIAGNOSIS — R110 Nausea: Secondary | ICD-10-CM

## 2012-09-30 DIAGNOSIS — R197 Diarrhea, unspecified: Secondary | ICD-10-CM

## 2012-09-30 DIAGNOSIS — K589 Irritable bowel syndrome without diarrhea: Secondary | ICD-10-CM

## 2012-09-30 DIAGNOSIS — K219 Gastro-esophageal reflux disease without esophagitis: Secondary | ICD-10-CM

## 2012-09-30 MED ORDER — ONDANSETRON HCL 4 MG PO TABS: ORAL_TABLET | ORAL | Status: DC

## 2012-09-30 MED ORDER — CILIDINIUM-CHLORDIAZEPOXIDE 2.5-5 MG PO CAPS: 1.0000 | ORAL_CAPSULE | Freq: Three times a day (TID) | ORAL | Status: DC

## 2012-09-30 NOTE — Patient Instructions (Addendum)
We have sent the following medications to your pharmacy for you to pick up at your convenience: Librax and Zofran  You may use over the counter Imodium as needed for diarrhea.   You may also use fiber such as metamucil or benefiber daily.

## 2012-09-30 NOTE — Progress Notes (Signed)
HISTORY OF PRESENT ILLNESS:  Andrea [name] is a 37 y.o. female with hypertension, anxiety/depression, hypothyroidism, hyperlipidemia, morbid obesity, GERD, and IBS. Also history of anal fissure. I last saw the patient in November of 2011 for anal fissure, insignificant external hemorrhoid, rectal bleeding, IBS, and GERD. She was to followup in 6 weeks but did not. She tells me that she said job change. Seen by our physician assistant 09/16/2012 regarding diarrhea. Negative workup including stool studies and laboratories. She was given samples of probiotic which helped a little. Her chief complaint today is that of rectal discomfort and a "knot" on the rectum. In addition, and essentially positive GI review of systems including abdominal pain, reflux, belching, bloating, chest pain, dysphagia, decreased appetite, nausea, weight change, anal discomfort, bowel habit changes, diarrhea, incontinence, hemorrhoids, irritable bowel, rectal bleeding. She did undergo complete colonoscopy and upper endoscopy in May of 2008. Colonoscopy was normal except for external hemorrhoids. Upper endoscopy was normal. She has had some mild ileal thickening on remote imaging in the past. Deep intubation of the ileum was normal. Testing for C1 esterase related problems negative. She requests outing for nausea that is nonsedating  REVIEW OF SYSTEMS:  All non-GI ROS negative except for anxiety, back pain, confusion, depression, fatigue, headaches, heart rhythm change, muscle pains, muscle cramps, night sweats, shortness of breath, sleeping problems, ankle swelling, increased thirst, increased urination, urinary frequency, urinary leakage  Past Medical History  Diagnosis Date  . Hypertension   . MVP (mitral valve prolapse)     echo per dr Andrea [name]  . Anxiety   . Depression   . Headache   . Protein S deficiency 2003    dvt/[name] embolus-on coumadin for 6 months then off-Sees Andrea [name]  . Arthritis     ruptured  lumbar disc-careful with positioning  . Hypothyroidism   . Blood dyscrasia     protein s deficiency-no treatment since 2006  . Hyperlipemia   . IBS (irritable bowel syndrome)   . GERD (gastroesophageal reflux disease)   . Hemorrhoids     Past Surgical History  Procedure Laterality Date  . Vaginal reconstructive surgery  2001  . Cesarean section    . Tonsillectomy  92  . Laparoscopic assisted vaginal hysterectomy  03/27/2011    Procedure: LAPAROSCOPIC ASSISTED VAGINAL HYSTERECTOMY;  Surgeon: Andrea Hawking, MD;  Location: [facility];  Service: Gynecology;  Laterality: N/A;  . Salpingoophorectomy  03/27/2011    Procedure: SALPINGO OOPHERECTOMY;  Surgeon: Andrea Hawking, MD;  Location: [facility];  Service: Gynecology;  Laterality: Bilateral;    Social History Andrea [name]  reports that she quit smoking about 8 years ago. Her smoking use included Cigarettes. She smoked 0.00 packs per day. She has never used smokeless tobacco. She reports that she does not drink alcohol or use illicit drugs.  family history includes Hyperlipidemia in her father and Hypertension in her other.  No Known Allergies     PHYSICAL EXAMINATION: Vital signs: BP 120/80  Pulse 81  Ht 5\' 8"  (1.727 m)  Wt 240 lb (108.863 kg)  BMI 36.5 kg/m2  LMP 03/19/2011  Constitutional: Obese, otherwise generally well-appearing, no acute distress Psychiatric: Anxious with pressured speech, alert and oriented x3, cooperative Eyes: extraocular movements intact, anicteric, conjunctiva pink Mouth: oral pharynx moist, no lesions Neck: supple no lymphadenopathy Cardiovascular: heart regular rate and rhythm, no murmur Lungs: clear to auscultation bilaterally Abdomen: soft, obese, nontender, nondistended, no obvious ascites, no peritoneal signs, normal bowel sounds, no organomegaly Rectal: Small  unremarkable external hemorrhoid. No tenderness. No fissure. Palpable anal papilla. Hemoccult negative stool Extremities: no  lower extremity edema bilaterally Skin: no lesions on visible extremities Neuro: No focal deficits.  ASSESSMENT:  #1. Anal discomfort and anal "knot". Unimpressive external hemorrhoid and incidental anal papilla. No fissure. No tenderness on exam. Nothing further needs to be done. Reassurance provided #2. Diarrhea predominant IBS. Ongoing #3. Chronic GERD. Ongoing #4. Chronic nausea #5. Anxiety/depression. Ongoing #6. Multiple somatic complaints  PLAN:  #1. Daily fiber supplementation with Metamucil #2. Prescribed Librax as needed for IBS symptoms. This has helped in the past. #3. Prescribe Zofran as needed for nausea #4. Imodium as needed for diarrhea #5. Return to PCP regarding multiple non-GI somatic complaints. Still appears to have significant anxiety/depression issues despite her medical therapies for this problem.

## 2012-10-01 ENCOUNTER — Other Ambulatory Visit: Payer: Self-pay | Admitting: Obstetrics and Gynecology

## 2012-10-15 ENCOUNTER — Institutional Professional Consult (permissible substitution): Payer: 59 | Admitting: Critical Care Medicine

## 2012-10-22 ENCOUNTER — Ambulatory Visit (INDEPENDENT_AMBULATORY_CARE_PROVIDER_SITE_OTHER): Payer: 59 | Admitting: Critical Care Medicine

## 2012-10-22 ENCOUNTER — Encounter: Payer: Self-pay | Admitting: Critical Care Medicine

## 2012-10-22 VITALS — BP 118/90 | HR 72 | Temp 98.8°F | Ht 69.0 in | Wt 245.0 lb

## 2012-10-22 DIAGNOSIS — R0989 Other specified symptoms and signs involving the circulatory and respiratory systems: Secondary | ICD-10-CM

## 2012-10-22 DIAGNOSIS — R0609 Other forms of dyspnea: Secondary | ICD-10-CM

## 2012-10-22 DIAGNOSIS — R0600 Dyspnea, unspecified: Secondary | ICD-10-CM

## 2012-10-22 LAB — D-DIMER, QUANTITATIVE: D-Dimer, Quant: 0.32 ug/mL-FEU (ref 0.00–0.48)

## 2012-10-22 NOTE — Assessment & Plan Note (Signed)
Dyspnea on exertion without evidence of primary pulmonary process Normal spirometry and normal chest x-ray No exertional desaturation Doubt thromboembolic disease present  Plan No change in medications Lab work: D Dimer An echocardiogram will be obtained No imaging studies needed at this time

## 2012-10-22 NOTE — Progress Notes (Signed)
Subjective:    Patient ID: Andrea [name], female    DOB: Jul 23, 1975, 37 y.o.   MRN: [id number] Chief Complaint  Patient presents with  . Pulmonary Consult    Former pt - last seen 2006.  Has SOB at rest and with little activity like walking for the past few wks.    HPI Comments: Hx of Protein C deficiency dx 2005 with pregnancy. Prior hx PE and DVT 2003. 6months Lovenox. Never used coumadin. Former pt . Seen previously with same.    Now one month of dyspnea on exertion, severe headaches.  Just with minimal exertion.  ?heat makes it worse   Shortness of Breath This is a new problem. The current episode started more than 1 month ago. The problem occurs daily (minimal exertion.  ). Associated symptoms include abdominal pain, chest pain, ear pain, headaches, leg pain, neck pain, a sore throat and wheezing. Pertinent negatives include no claudication, coryza, fever, hemoptysis, leg swelling, orthopnea, PND, rash, rhinorrhea, sputum production, swollen glands, syncope or vomiting. The symptoms are aggravated by any activity, exercise, weather changes, odors, fumes and occupational exposure. Associated symptoms comments: Notes burning in thighs Notes generalized chest pain at rest>>tightness Notes R neck pain. Risk factors include smoking. Her past medical history is significant for DVT and PE. There is no history of allergies, aspirin allergies, asthma, bronchiolitis, CAD, chronic lung disease, COPD, a heart failure, pneumonia or a recent surgery.    Past Medical History  Diagnosis Date  . Hypertension   . MVP (mitral valve prolapse)     echo per dr Felipa Eth  . Anxiety   . Depression   . Headache(784.0)   . Protein S deficiency 2003    dvt/pulmonary embolus-on coumadin for 6 months then off-Sees [facility]  . Arthritis     ruptured lumbar disc-careful with positioning  . Hypothyroidism   . Blood dyscrasia     protein s deficiency-no treatment since 2006  . Hyperlipemia   . IBS  (irritable bowel syndrome)   . GERD (gastroesophageal reflux disease)   . Hemorrhoids   . Pulmonary embolism   . PVC (premature ventricular contraction)      Family History  Problem Relation Age of Onset  . Hypertension Other     entire family on both sides  . Hyperlipidemia Father     fathers side of family  . Asthma Son   . Asthma Son   . Clotting disorder Maternal Uncle   . Clotting disorder Paternal Grandmother   . Heart disease Maternal Grandfather      History   Social History  . Marital Status: Legally Separated    Spouse Name: N/A    Number of Children: 2  . Years of Education: N/A   Occupational History  . nutrition [employer]   Social History Main Topics  . Smoking status: Former Smoker -- 1.00 packs/day for 15 years    Types: Cigarettes    Quit date: 03/18/2004  . Smokeless tobacco: Never Used  . Alcohol Use: No  . Drug Use: No  . Sexually Active: Not on file   Other Topics Concern  . Not on file   Social History Narrative  . No narrative on file     No Known Allergies   Outpatient Prescriptions Prior to Visit  Medication Sig Dispense Refill  . ALPRAZolam (XANAX) 1 MG tablet Take 1 mg by mouth at bedtime as needed.      Marland Kitchen amLODipine (NORVASC) 5 MG tablet  Take 5 mg by mouth daily.       . bifidobacterium infantis (ALIGN) capsule Take 1 capsule by mouth daily.  14 capsule  0  . calcium carbonate (OS-CAL) 600 MG TABS Take 600 mg by mouth daily.      . clidinium-chlordiazePOXIDE (LIBRAX) 2.5-5 MG per capsule Take 1 capsule by mouth 3 (three) times daily before meals.  100 capsule  6  . clonazePAM (KLONOPIN) 1 MG tablet Take 1 mg by mouth. 2 in the morning  2 at bedtime      . DULoxetine HCl (CYMBALTA PO) Take 120 mg by mouth daily.       Marland Kitchen ezetimibe (ZETIA) 10 MG tablet Take 10 mg by mouth daily.       Marland Kitchen ibuprofen (ADVIL,MOTRIN) 200 MG tablet Take 600 mg by mouth every 6 (six) hours as needed. For pain.      Marland Kitchen levothyroxine (SYNTHROID,  LEVOTHROID) 150 MCG tablet Take 150 mcg by mouth daily before breakfast.      . Multiple Vitamins-Minerals (MULTIVITAMIN WITH MINERALS) tablet Take 1 tablet by mouth daily.       . ondansetron (ZOFRAN) 4 MG tablet Take one tablet every 4-6 hours as needed for nausea  30 tablet  6  . rosuvastatin (CRESTOR) 10 MG tablet Take 10 mg by mouth daily.        . traZODone (DESYREL) 50 MG tablet Take 50 mg by mouth at bedtime.      . vitamin C (ASCORBIC ACID) 500 MG tablet Take 500 mg by mouth daily.       . Vitamin D, Ergocalciferol, (DRISDOL) 50000 UNITS CAPS Take 50,000 Units by mouth every 7 (seven) days. tuesdays      . lansoprazole (PREVACID) 30 MG capsule Take 1 capsule (30 mg total) by mouth daily.  30 capsule  0  . desvenlafaxine (PRISTIQ) 100 MG 24 hr tablet Take 100 mg by mouth daily.        No facility-administered medications prior to visit.      Review of Systems  Constitutional: Positive for diaphoresis and fatigue. Negative for fever, chills, activity change, appetite change and unexpected weight change.  HENT: Positive for ear pain, congestion, sore throat, trouble swallowing, neck pain, neck stiffness, sinus pressure and tinnitus. Negative for hearing loss, nosebleeds, facial swelling, rhinorrhea, sneezing, mouth sores, dental problem, voice change, postnasal drip and ear discharge.   Eyes: Positive for visual disturbance. Negative for photophobia, discharge and itching.  Respiratory: Positive for chest tightness, shortness of breath and wheezing. Negative for apnea, cough, hemoptysis, sputum production, choking and stridor.   Cardiovascular: Positive for chest pain and palpitations. Negative for orthopnea, claudication, leg swelling, syncope and PND.  Gastrointestinal: Positive for nausea and abdominal pain. Negative for vomiting, constipation, blood in stool and abdominal distention.  Genitourinary: Negative for dysuria, urgency, frequency, hematuria, flank pain, decreased urine  volume and difficulty urinating.  Musculoskeletal: Positive for back pain and gait problem. Negative for myalgias, joint swelling and arthralgias.  Skin: Negative for color change, pallor and rash.  Neurological: Positive for dizziness, weakness, light-headedness, numbness and headaches. Negative for tremors, seizures, syncope and speech difficulty.  Hematological: Negative for adenopathy. Does not bruise/bleed easily.  Psychiatric/Behavioral: Positive for confusion and sleep disturbance. Negative for agitation. The patient is nervous/anxious.        Objective:   Physical Exam Filed Vitals:   10/22/12 0912  BP: 118/90  Pulse: 72  Temp: 98.8 F (37.1 C)  TempSrc: Oral  Height: 5'  9" (1.753 m)  Weight: 245 lb (111.131 kg)  SpO2: 97%    Gen: Pleasant, obese, in no distress,  normal affect  ENT: No lesions,  mouth clear,  oropharynx clear, no postnasal drip  Neck: No JVD, no TMG, no carotid bruits  Lungs: No use of accessory muscles, no dullness to percussion, clear without rales or rhonchi  Cardiovascular: RRR, heart sounds normal, no murmur or gallops, no peripheral edema  Abdomen: soft and NT, no HSM,  BS normal  Musculoskeletal: No deformities, no cyanosis or clubbing  Neuro: alert, non focal  Skin: Warm, no lesions or rashes  No results found.        Assessment & Plan:   Dyspnea Dyspnea on exertion without evidence of primary pulmonary process Normal spirometry and normal chest x-ray No exertional desaturation Doubt thromboembolic disease present  Plan No change in medications Lab work: D Dimer An echocardiogram will be obtained No imaging studies needed at this time     Updated Medication List Outpatient Encounter Prescriptions as of 10/22/2012  Medication Sig Dispense Refill  . ALPRAZolam (XANAX) 1 MG tablet Take 1 mg by mouth at bedtime as needed.      Marland Kitchen amLODipine (NORVASC) 5 MG tablet Take 5 mg by mouth daily.       . bifidobacterium infantis  (ALIGN) capsule Take 1 capsule by mouth daily.  14 capsule  0  . calcium carbonate (OS-CAL) 600 MG TABS Take 600 mg by mouth daily.      . clidinium-chlordiazePOXIDE (LIBRAX) 2.5-5 MG per capsule Take 1 capsule by mouth 3 (three) times daily before meals.  100 capsule  6  . clonazePAM (KLONOPIN) 1 MG tablet Take 1 mg by mouth. 2 in the morning  2 at bedtime      . DULoxetine HCl (CYMBALTA PO) Take 120 mg by mouth daily.       Marland Kitchen ezetimibe (ZETIA) 10 MG tablet Take 10 mg by mouth daily.       Marland Kitchen ibuprofen (ADVIL,MOTRIN) 200 MG tablet Take 600 mg by mouth every 6 (six) hours as needed. For pain.      Marland Kitchen lansoprazole (PREVACID) 30 MG capsule Take 30 mg by mouth daily as needed.      Marland Kitchen levothyroxine (SYNTHROID, LEVOTHROID) 150 MCG tablet Take 150 mcg by mouth daily before breakfast.      . Multiple Vitamins-Minerals (MULTIVITAMIN WITH MINERALS) tablet Take 1 tablet by mouth daily.       . ondansetron (ZOFRAN) 4 MG tablet Take one tablet every 4-6 hours as needed for nausea  30 tablet  6  . rosuvastatin (CRESTOR) 10 MG tablet Take 10 mg by mouth daily.        . traZODone (DESYREL) 50 MG tablet Take 50 mg by mouth at bedtime.      . vitamin C (ASCORBIC ACID) 500 MG tablet Take 500 mg by mouth daily.       . Vitamin D, Ergocalciferol, (DRISDOL) 50000 UNITS CAPS Take 50,000 Units by mouth every 7 (seven) days. tuesdays      . [DISCONTINUED] lansoprazole (PREVACID) 30 MG capsule Take 1 capsule (30 mg total) by mouth daily.  30 capsule  0  . [DISCONTINUED] desvenlafaxine (PRISTIQ) 100 MG 24 hr tablet Take 100 mg by mouth daily.        No facility-administered encounter medications on file as of 10/22/2012.

## 2012-10-22 NOTE — Patient Instructions (Addendum)
No change in medications Lab work: D Dimer An echocardiogram will be obtained No imaging studies needed at this time I will call results

## 2012-10-23 ENCOUNTER — Telehealth: Payer: Self-pay | Admitting: Critical Care Medicine

## 2012-10-23 NOTE — Telephone Encounter (Signed)
Lillia Abed spoke with pt regarding lab results. Per Lillia Abed, pt states Dr. Delford Field mentioned having a stress test done. Do not see order for this or it mentioned in the OV note. Pt would like to know if PW would like to proceed with this. Dr. Delford Field, pls advise.  Thank you.

## 2012-10-23 NOTE — Telephone Encounter (Signed)
Returning call cn be reached at [phone].Andrea [name]

## 2012-10-23 NOTE — Telephone Encounter (Signed)
Called, spoke with pt.  Informed her of below per PW.  She verbalized understanding and voiced no further questions or concerns at this time.

## 2012-10-23 NOTE — Progress Notes (Signed)
Quick Note:  Call pt and tell her D dimer is normal so no evidence for any blood clotting  ______

## 2012-10-23 NOTE — Telephone Encounter (Signed)
LMTCB

## 2012-10-23 NOTE — Progress Notes (Signed)
Quick Note:  lmomtcb ______ 

## 2012-10-23 NOTE — Telephone Encounter (Signed)
i am waiting on echo result first

## 2012-10-28 ENCOUNTER — Ambulatory Visit (HOSPITAL_BASED_OUTPATIENT_CLINIC_OR_DEPARTMENT_OTHER)
Admission: RE | Admit: 2012-10-28 | Discharge: 2012-10-28 | Disposition: A | Discharge status: Home / Self Care | Payer: 59 | Source: Ambulatory Visit | Attending: Critical Care Medicine | Admitting: Critical Care Medicine

## 2012-10-28 DIAGNOSIS — R0600 Dyspnea, unspecified: Secondary | ICD-10-CM

## 2012-10-28 DIAGNOSIS — R0609 Other forms of dyspnea: Secondary | ICD-10-CM | POA: Insufficient documentation

## 2012-10-28 DIAGNOSIS — R0989 Other specified symptoms and signs involving the circulatory and respiratory systems: Secondary | ICD-10-CM | POA: Insufficient documentation

## 2012-10-28 NOTE — Progress Notes (Signed)
  Echocardiogram 2D Echocardiogram has been performed.  Georgian Co 10/28/2012, 12:13 PM

## 2012-11-02 NOTE — Progress Notes (Signed)
Quick Note:  Notify the patient that the Echo was NORMAL. I would like to proceed to order a cardiopulmonary stress test [facility] ______

## 2012-11-03 ENCOUNTER — Other Ambulatory Visit: Payer: Self-pay | Admitting: Critical Care Medicine

## 2012-11-03 DIAGNOSIS — R0600 Dyspnea, unspecified: Secondary | ICD-10-CM

## 2012-11-03 NOTE — Progress Notes (Signed)
Quick Note:  lmomtcb for pt -- cardiopulmonary stress test at [facility] will need to be ordered after speaking with the pt. ______

## 2012-11-03 NOTE — Progress Notes (Signed)
Quick Note:  Spoke with pt. Informed her of echo results per PW. She is aware order placed for cardiopulmonary stress test to be done [facility]. She verbalized understanding and voiced no further questions or concerns at this time. ______

## 2012-11-16 ENCOUNTER — Ambulatory Visit (HOSPITAL_COMMUNITY): Payer: 59 | Attending: Critical Care Medicine

## 2012-11-16 DIAGNOSIS — R0602 Shortness of breath: Secondary | ICD-10-CM

## 2012-11-16 DIAGNOSIS — R0600 Dyspnea, unspecified: Secondary | ICD-10-CM

## 2012-11-18 ENCOUNTER — Other Ambulatory Visit (HOSPITAL_COMMUNITY): Payer: Self-pay | Admitting: Internal Medicine

## 2012-11-18 DIAGNOSIS — H538 Other visual disturbances: Secondary | ICD-10-CM

## 2012-11-19 ENCOUNTER — Ambulatory Visit (HOSPITAL_COMMUNITY)
Admission: RE | Admit: 2012-11-19 | Discharge: 2012-11-19 | Disposition: A | Discharge status: Home / Self Care | Payer: 59 | Source: Ambulatory Visit | Attending: Internal Medicine | Admitting: Internal Medicine

## 2012-11-19 DIAGNOSIS — R51 Headache: Secondary | ICD-10-CM | POA: Insufficient documentation

## 2012-11-19 DIAGNOSIS — I10 Essential (primary) hypertension: Secondary | ICD-10-CM | POA: Insufficient documentation

## 2012-11-19 DIAGNOSIS — R110 Nausea: Secondary | ICD-10-CM | POA: Insufficient documentation

## 2012-11-19 DIAGNOSIS — H538 Other visual disturbances: Secondary | ICD-10-CM | POA: Insufficient documentation

## 2012-11-19 DIAGNOSIS — R42 Dizziness and giddiness: Secondary | ICD-10-CM | POA: Insufficient documentation

## 2012-11-30 ENCOUNTER — Telehealth: Payer: Self-pay | Admitting: Critical Care Medicine

## 2012-11-30 NOTE — Telephone Encounter (Signed)
Spoke with patient and made her aware of results of stress test. Patient verbalized understanding Patient would like to know Dr. Florene Route rec's since she is still having SOB and finding it diff to breathe esp with the humidity Dr. Delford Field please advise       Call pt and tell her cardiopulmonary stress test was normal . No active lung or cardiac issues seen

## 2012-11-30 NOTE — Telephone Encounter (Signed)
LMTCB

## 2012-11-30 NOTE — Progress Notes (Signed)
Quick Note:  Spoke with patient and made her aware of results of stress test. Patient verbalized understanding ______

## 2012-11-30 NOTE — Telephone Encounter (Signed)
None of our studies suggest active lung disease.   No further pulmonary recommendations  Happy to go over all tests in an OV

## 2012-12-01 NOTE — Telephone Encounter (Signed)
lmomtcb x 2  

## 2012-12-02 NOTE — Telephone Encounter (Signed)
Pt advised. Andrea [name], CMA  

## 2012-12-18 ENCOUNTER — Other Ambulatory Visit (HOSPITAL_COMMUNITY): Payer: Self-pay | Admitting: Sports Medicine

## 2012-12-18 DIAGNOSIS — M5430 Sciatica, unspecified side: Secondary | ICD-10-CM

## 2012-12-23 ENCOUNTER — Ambulatory Visit: Payer: 59 | Attending: Sports Medicine | Admitting: Physical Therapy

## 2012-12-23 DIAGNOSIS — M545 Low back pain, unspecified: Secondary | ICD-10-CM | POA: Insufficient documentation

## 2012-12-23 DIAGNOSIS — IMO0001 Reserved for inherently not codable concepts without codable children: Secondary | ICD-10-CM | POA: Insufficient documentation

## 2012-12-24 ENCOUNTER — Other Ambulatory Visit (HOSPITAL_COMMUNITY): Payer: 59

## 2012-12-24 ENCOUNTER — Ambulatory Visit (HOSPITAL_COMMUNITY)
Admission: RE | Admit: 2012-12-24 | Discharge: 2012-12-24 | Disposition: A | Discharge status: Home / Self Care | Payer: 59 | Source: Ambulatory Visit | Attending: Sports Medicine | Admitting: Sports Medicine

## 2012-12-24 DIAGNOSIS — D1809 Hemangioma of other sites: Secondary | ICD-10-CM | POA: Insufficient documentation

## 2012-12-24 DIAGNOSIS — M5126 Other intervertebral disc displacement, lumbar region: Secondary | ICD-10-CM | POA: Insufficient documentation

## 2012-12-24 DIAGNOSIS — M5430 Sciatica, unspecified side: Secondary | ICD-10-CM

## 2012-12-28 ENCOUNTER — Other Ambulatory Visit: Payer: Self-pay | Admitting: Sports Medicine

## 2012-12-28 ENCOUNTER — Ambulatory Visit: Payer: 59 | Admitting: Physical Therapy

## 2012-12-28 DIAGNOSIS — M5431 Sciatica, right side: Secondary | ICD-10-CM

## 2012-12-31 ENCOUNTER — Ambulatory Visit: Payer: 59 | Admitting: Physical Therapy

## 2013-01-01 ENCOUNTER — Other Ambulatory Visit: Payer: Self-pay | Admitting: Neurosurgery

## 2013-01-01 ENCOUNTER — Ambulatory Visit
Admission: RE | Admit: 2013-01-01 | Discharge: 2013-01-01 | Disposition: A | Discharge status: Home / Self Care | Payer: 59 | Source: Ambulatory Visit | Attending: Sports Medicine | Admitting: Sports Medicine

## 2013-01-01 VITALS — BP 150/94 | HR 72

## 2013-01-01 DIAGNOSIS — M545 Low back pain, unspecified: Secondary | ICD-10-CM

## 2013-01-01 DIAGNOSIS — M79605 Pain in left leg: Secondary | ICD-10-CM

## 2013-01-01 DIAGNOSIS — M5431 Sciatica, right side: Secondary | ICD-10-CM

## 2013-01-01 MED ORDER — METHYLPREDNISOLONE ACETATE 40 MG/ML INJ SUSP (RADIOLOG
120.0000 mg | Freq: Once | INTRAMUSCULAR | Status: AC
  Administered 2013-01-01: 120 mg via EPIDURAL

## 2013-01-01 MED ORDER — IOHEXOL 180 MG/ML  SOLN
1.0000 mL | Freq: Once | INTRAMUSCULAR | Status: AC | PRN
  Administered 2013-01-01: 1 mL via EPIDURAL

## 2013-01-04 ENCOUNTER — Ambulatory Visit: Payer: 59 | Admitting: Physical Therapy

## 2013-01-07 ENCOUNTER — Encounter: Payer: 59 | Admitting: Physical Therapy

## 2013-01-12 ENCOUNTER — Ambulatory Visit: Payer: 59 | Admitting: Physical Therapy

## 2013-01-14 ENCOUNTER — Ambulatory Visit: Payer: 59 | Admitting: Physical Therapy

## 2013-01-19 ENCOUNTER — Other Ambulatory Visit: Payer: Self-pay | Admitting: Neurosurgery

## 2013-01-19 ENCOUNTER — Ambulatory Visit
Admission: RE | Admit: 2013-01-19 | Discharge: 2013-01-19 | Disposition: A | Discharge status: Home / Self Care | Payer: 59 | Source: Ambulatory Visit | Attending: Neurosurgery | Admitting: Neurosurgery

## 2013-01-19 ENCOUNTER — Encounter: Payer: 59 | Admitting: Physical Therapy

## 2013-01-19 VITALS — BP 149/75 | HR 59

## 2013-01-19 DIAGNOSIS — M545 Low back pain, unspecified: Secondary | ICD-10-CM

## 2013-01-19 DIAGNOSIS — M79605 Pain in left leg: Secondary | ICD-10-CM

## 2013-01-19 MED ORDER — METHYLPREDNISOLONE ACETATE 40 MG/ML INJ SUSP (RADIOLOG
120.0000 mg | Freq: Once | INTRAMUSCULAR | Status: AC
  Administered 2013-01-19: 120 mg via EPIDURAL

## 2013-01-19 MED ORDER — HYDROMORPHONE HCL PF 2 MG/ML IJ SOLN: 2.0000 mg | Freq: Once | INTRAMUSCULAR | Status: DC

## 2013-01-19 MED ORDER — ONDANSETRON HCL 4 MG/2ML IJ SOLN: 4.0000 mg | Freq: Once | INTRAMUSCULAR | Status: DC

## 2013-01-19 MED ORDER — IOHEXOL 180 MG/ML  SOLN
1.0000 mL | Freq: Once | INTRAMUSCULAR | Status: AC | PRN
  Administered 2013-01-19: 1 mL via EPIDURAL

## 2013-01-20 ENCOUNTER — Other Ambulatory Visit: Payer: Self-pay | Admitting: Neurosurgery

## 2013-01-20 NOTE — Progress Notes (Signed)
Patient called this morning to report she believes Dr. Bonnielee Haff performed yesterday's LESI at the wrong level since her bandaid is too low compared to where her pain is.  I reminded her her order was for L5-S1 ESI x 3 and that both yesterday's, 01/01/13's and 01/16/10's (done by Dr. Benard Rink) LESIs were all done at the same level per referring MD order, our MDs' assement and our Spin/Injection Patient History and Screening tool she completed for [place].  She then stated she believed Dr. Bonnielee Haff used a placebo on 01/01/13.  I told her that to do that would be unethical and immoral, and that she certainly received the usual dose of steroid.  Patient is scheduled to see Dr. Alfredo Batty on Monday, February 08, 2013 at 9:00am for third and final LESI in this series.  jkl

## 2013-01-21 ENCOUNTER — Encounter: Payer: 59 | Admitting: Physical Therapy

## 2013-01-25 ENCOUNTER — Encounter: Payer: 59 | Admitting: Physical Therapy

## 2013-01-27 ENCOUNTER — Ambulatory Visit: Payer: 59 | Attending: Sports Medicine | Admitting: Physical Therapy

## 2013-01-27 DIAGNOSIS — M545 Low back pain, unspecified: Secondary | ICD-10-CM | POA: Insufficient documentation

## 2013-01-27 DIAGNOSIS — IMO0001 Reserved for inherently not codable concepts without codable children: Secondary | ICD-10-CM | POA: Insufficient documentation

## 2013-02-01 ENCOUNTER — Ambulatory Visit: Payer: 59 | Admitting: Physical Therapy

## 2013-02-03 ENCOUNTER — Ambulatory Visit: Payer: 59 | Admitting: Physical Therapy

## 2013-02-08 ENCOUNTER — Ambulatory Visit
Admission: RE | Admit: 2013-02-08 | Discharge: 2013-02-08 | Disposition: A | Discharge status: Home / Self Care | Payer: 59 | Source: Ambulatory Visit | Attending: Neurosurgery | Admitting: Neurosurgery

## 2013-02-08 ENCOUNTER — Encounter: Payer: 59 | Admitting: Physical Therapy

## 2013-02-08 DIAGNOSIS — M545 Low back pain, unspecified: Secondary | ICD-10-CM

## 2013-02-08 MED ORDER — ONDANSETRON HCL 4 MG/2ML IJ SOLN: 4.0000 mg | Freq: Once | INTRAMUSCULAR | Status: DC

## 2013-02-08 MED ORDER — MEPERIDINE HCL 100 MG/ML IJ SOLN: 50.0000 mg | Freq: Once | INTRAMUSCULAR | Status: DC

## 2013-02-08 MED ORDER — IOHEXOL 180 MG/ML  SOLN
1.0000 mL | Freq: Once | INTRAMUSCULAR | Status: AC | PRN
  Administered 2013-02-08: 1 mL via EPIDURAL

## 2013-02-08 MED ORDER — METHYLPREDNISOLONE ACETATE 40 MG/ML INJ SUSP (RADIOLOG
120.0000 mg | Freq: Once | INTRAMUSCULAR | Status: AC
  Administered 2013-02-08: 120 mg via EPIDURAL

## 2013-02-10 ENCOUNTER — Ambulatory Visit: Payer: 59 | Admitting: Physical Therapy

## 2013-02-15 ENCOUNTER — Ambulatory Visit: Payer: 59 | Admitting: Physical Therapy

## 2013-02-16 ENCOUNTER — Ambulatory Visit: Payer: 59 | Admitting: Physical Therapy

## 2013-04-19 ENCOUNTER — Encounter: Payer: Self-pay | Admitting: Podiatry

## 2013-04-19 ENCOUNTER — Ambulatory Visit (INDEPENDENT_AMBULATORY_CARE_PROVIDER_SITE_OTHER): Payer: BC Managed Care – PPO | Admitting: Podiatry

## 2013-04-19 VITALS — BP 124/83 | HR 95 | Resp 16

## 2013-04-19 DIAGNOSIS — M722 Plantar fascial fibromatosis: Secondary | ICD-10-CM

## 2013-04-19 MED ORDER — DICLOFENAC SODIUM 75 MG PO TBEC: 75.0000 mg | DELAYED_RELEASE_TABLET | Freq: Two times a day (BID) | ORAL | Status: DC

## 2013-04-19 MED ORDER — TRIAMCINOLONE ACETONIDE 10 MG/ML IJ SUSP
10.0000 mg | Freq: Once | INTRAMUSCULAR | Status: AC
  Administered 2013-04-19: 10 mg

## 2013-04-19 NOTE — Patient Instructions (Addendum)

## 2013-04-19 NOTE — Progress Notes (Signed)
Subjective:     Patient ID: Andrea [name], female   DOB: Dec 25, 1975, 37 y.o.   MRN: [id number]  HPI patient states that this right heel has been killing her on the outside and that it makes it difficult for her to work   Review of Systems     Objective:   Physical Exam    neurovascular status is intact with no health history issues noted. The lateral aspect of the right plantar heel is very tender and moderately into the center of the heel but no medial pain was noted Assessment:     Lateral band plantar fasciitis right of an acute nature    Plan:     H. performed and at this time careful lateral injection 3 mg Kenalog 5 mg Xylocaine Marcaine administered. I did dispense air fracture walker with instructions on usage to reduce pressure and dispensed orthotics with instructions on usage

## 2013-05-10 ENCOUNTER — Ambulatory Visit: Payer: BC Managed Care – PPO | Admitting: Podiatry

## 2013-05-26 ENCOUNTER — Ambulatory Visit: Payer: BC Managed Care – PPO | Admitting: Podiatry

## 2013-05-31 ENCOUNTER — Ambulatory Visit: Payer: BC Managed Care – PPO | Admitting: Podiatry

## 2013-06-02 ENCOUNTER — Ambulatory Visit: Payer: BC Managed Care – PPO | Admitting: Podiatry

## 2013-06-14 ENCOUNTER — Ambulatory Visit (INDEPENDENT_AMBULATORY_CARE_PROVIDER_SITE_OTHER): Payer: BC Managed Care – PPO | Admitting: Podiatry

## 2013-06-14 ENCOUNTER — Encounter: Payer: Self-pay | Admitting: Podiatry

## 2013-06-14 VITALS — BP 120/82 | HR 82 | Resp 12

## 2013-06-14 DIAGNOSIS — M722 Plantar fascial fibromatosis: Secondary | ICD-10-CM

## 2013-06-14 NOTE — Progress Notes (Signed)
Subjective:     Patient ID: Andrea [name], female   DOB: 07/06/1975, 38 y.o.   MRN: [id number]  HPI patient presents stating I'm here because my right heel is killing me and it is not responding to him mobilization bracing orthotics injections and reduced activity. States she wants to lose weight but cannot because of pain   Review of Systems     Objective:   Physical Exam Neurovascular status intact with no health history changes noted in exquisite discomfort medial fascial band right at the insertion into the calcaneus with complaints of the rest of the foot hurting but she cannot describe where it is coming from but seems to be due to compensation    Assessment:     Plantar fasciitis right of a severe nature that is very tender when pressed    Plan:     H&P performed and conditions discussed at great length. I do think it would be best to go ahead and fix this due to long-standing nature and failure to respond to conservative care and she wants to get this done understanding risk I allowed her to read a consent form [facility] going over all possible complications including chronic arch pain forefoot pain or other problems that can occur with this particular procedure. She is willing to accept risks wants surgery and signs consent and understands total recovery can take upwards of 6 months to one year

## 2013-06-25 ENCOUNTER — Telehealth: Payer: Self-pay | Admitting: *Deleted

## 2013-06-25 DIAGNOSIS — M79609 Pain in unspecified limb: Secondary | ICD-10-CM

## 2013-06-25 NOTE — Telephone Encounter (Addendum)
Pt cancelled surgery of 06/29/2013 for right endoscopic plantar fasicotomy with Dr Paulla Dolly.  Pt states cannot get caregiver for autistic son or transportation.  Pt states she would like to try Physical Therapy.  I will inform Dr Paulla Dolly and call pt again.  Dr Paulla Dolly ordered physical therapy within the [facility] system to evaluate and treat right plantar fasciitis 3 times a week for 3 weeks.  Left a message [phone] to expect a call for [facility] on [facility].  Pt called states she does not want to be referred to a [facility] facility.  Cancelled the referral while I was on the phone with the pt.

## 2013-07-05 ENCOUNTER — Encounter: Payer: BC Managed Care – PPO | Admitting: Podiatry

## 2013-07-12 DIAGNOSIS — M722 Plantar fascial fibromatosis: Secondary | ICD-10-CM

## 2013-08-27 ENCOUNTER — Other Ambulatory Visit: Payer: Self-pay | Admitting: Podiatry

## 2014-01-07 ENCOUNTER — Emergency Department (HOSPITAL_BASED_OUTPATIENT_CLINIC_OR_DEPARTMENT_OTHER)
Admission: EM | Admit: 2014-01-07 | Discharge: 2014-01-07 | Disposition: A | Discharge status: Home / Self Care | Payer: BC Managed Care – PPO | Attending: Emergency Medicine | Admitting: Emergency Medicine

## 2014-01-07 ENCOUNTER — Emergency Department (HOSPITAL_BASED_OUTPATIENT_CLINIC_OR_DEPARTMENT_OTHER): Payer: BC Managed Care – PPO

## 2014-01-07 ENCOUNTER — Encounter (HOSPITAL_BASED_OUTPATIENT_CLINIC_OR_DEPARTMENT_OTHER): Payer: Self-pay | Admitting: Emergency Medicine

## 2014-01-07 DIAGNOSIS — E785 Hyperlipidemia, unspecified: Secondary | ICD-10-CM | POA: Insufficient documentation

## 2014-01-07 DIAGNOSIS — F3289 Other specified depressive episodes: Secondary | ICD-10-CM | POA: Diagnosis not present

## 2014-01-07 DIAGNOSIS — Z792 Long term (current) use of antibiotics: Secondary | ICD-10-CM | POA: Diagnosis not present

## 2014-01-07 DIAGNOSIS — K219 Gastro-esophageal reflux disease without esophagitis: Secondary | ICD-10-CM | POA: Diagnosis not present

## 2014-01-07 DIAGNOSIS — Y9389 Activity, other specified: Secondary | ICD-10-CM | POA: Insufficient documentation

## 2014-01-07 DIAGNOSIS — Z86711 Personal history of pulmonary embolism: Secondary | ICD-10-CM | POA: Diagnosis not present

## 2014-01-07 DIAGNOSIS — M129 Arthropathy, unspecified: Secondary | ICD-10-CM | POA: Insufficient documentation

## 2014-01-07 DIAGNOSIS — F411 Generalized anxiety disorder: Secondary | ICD-10-CM | POA: Diagnosis not present

## 2014-01-07 DIAGNOSIS — E039 Hypothyroidism, unspecified: Secondary | ICD-10-CM | POA: Diagnosis not present

## 2014-01-07 DIAGNOSIS — Z23 Encounter for immunization: Secondary | ICD-10-CM | POA: Insufficient documentation

## 2014-01-07 DIAGNOSIS — F329 Major depressive disorder, single episode, unspecified: Secondary | ICD-10-CM | POA: Diagnosis not present

## 2014-01-07 DIAGNOSIS — Z79899 Other long term (current) drug therapy: Secondary | ICD-10-CM | POA: Diagnosis not present

## 2014-01-07 DIAGNOSIS — Y929 Unspecified place or not applicable: Secondary | ICD-10-CM | POA: Diagnosis not present

## 2014-01-07 DIAGNOSIS — S91332A Puncture wound without foreign body, left foot, initial encounter: Secondary | ICD-10-CM

## 2014-01-07 DIAGNOSIS — S91309A Unspecified open wound, unspecified foot, initial encounter: Secondary | ICD-10-CM | POA: Insufficient documentation

## 2014-01-07 DIAGNOSIS — Z791 Long term (current) use of non-steroidal anti-inflammatories (NSAID): Secondary | ICD-10-CM | POA: Insufficient documentation

## 2014-01-07 DIAGNOSIS — Z87891 Personal history of nicotine dependence: Secondary | ICD-10-CM | POA: Insufficient documentation

## 2014-01-07 DIAGNOSIS — I10 Essential (primary) hypertension: Secondary | ICD-10-CM | POA: Insufficient documentation

## 2014-01-07 DIAGNOSIS — W268XXA Contact with other sharp object(s), not elsewhere classified, initial encounter: Secondary | ICD-10-CM | POA: Insufficient documentation

## 2014-01-07 MED ORDER — ONDANSETRON 4 MG PO TBDP
4.0000 mg | ORAL_TABLET | Freq: Once | ORAL | Status: AC
  Administered 2014-01-07: 4 mg via ORAL
  Filled 2014-01-07: qty 1

## 2014-01-07 MED ORDER — OXYCODONE-ACETAMINOPHEN 5-325 MG PO TABS: 1.0000 | ORAL_TABLET | ORAL | Status: DC | PRN

## 2014-01-07 MED ORDER — CLINDAMYCIN HCL 300 MG PO CAPS: 300.0000 mg | ORAL_CAPSULE | Freq: Three times a day (TID) | ORAL | Status: DC

## 2014-01-07 MED ORDER — FLUCONAZOLE 200 MG PO TABS: 200.0000 mg | ORAL_TABLET | Freq: Every day | ORAL | Status: AC

## 2014-01-07 MED ORDER — ONDANSETRON HCL 4 MG PO TABS: 4.0000 mg | ORAL_TABLET | Freq: Three times a day (TID) | ORAL | Status: DC | PRN

## 2014-01-07 MED ORDER — TETANUS-DIPHTH-ACELL PERTUSSIS 5-2.5-18.5 LF-MCG/0.5 IM SUSP
0.5000 mL | Freq: Once | INTRAMUSCULAR | Status: AC
  Administered 2014-01-07: 0.5 mL via INTRAMUSCULAR
  Filled 2014-01-07: qty 0.5

## 2014-01-07 MED ORDER — CIPROFLOXACIN HCL 500 MG PO TABS: 500.0000 mg | ORAL_TABLET | Freq: Two times a day (BID) | ORAL | Status: DC

## 2014-01-07 MED ORDER — IBUPROFEN 400 MG PO TABS
600.0000 mg | ORAL_TABLET | Freq: Once | ORAL | Status: AC
  Administered 2014-01-07: 600 mg via ORAL
  Filled 2014-01-07 (×2): qty 1

## 2014-01-07 NOTE — ED Notes (Signed)
MD at bedside. 

## 2014-01-07 NOTE — ED Provider Notes (Signed)
CSN: [id number]     Arrival date & time 01/07/14  3646 History   First MD Initiated Contact with Patient 01/07/14 0827     Chief Complaint  Patient presents with  . Foot Injury     (Consider location/radiation/quality/duration/timing/severity/associated sxs/prior Treatment) Patient is a 38 y.o. female presenting with foot injury.  Foot Injury Location:  Foot Time since incident:  12 hours Injury: yes   Mechanism of injury comment:  Stepped on curved clean cage metal Foot location:  L foot Pain details:    Quality:  Aching   Severity:  Moderate   Onset quality:  Sudden   Timing:  Constant   Progression:  Unchanged Chronicity:  New Dislocation: no   Tetanus status:  Out of date Prior injury to area:  No Relieved by:  Rest Associated symptoms: stiffness and swelling   Associated symptoms: no back pain, no fever, no numbness and no tingling     Past Medical History  Diagnosis Date  . Hypertension   . MVP (mitral valve prolapse)     echo per dr Dagmar Hait  . Anxiety   . Depression   . Headache(784.0)   . Protein S deficiency 2003    dvt/pulmonary embolus-on coumadin for 6 months then off-Sees [facility]  . Arthritis     ruptured lumbar disc-careful with positioning  . Hypothyroidism   . Blood dyscrasia     protein s deficiency-no treatment since 2006  . Hyperlipemia   . IBS (irritable bowel syndrome)   . GERD (gastroesophageal reflux disease)   . Hemorrhoids   . Pulmonary embolism   . PVC (premature ventricular contraction)    Past Surgical History  Procedure Laterality Date  . Vaginal reconstructive surgery  2001  . Cesarean section    . Tonsillectomy  92  . Laparoscopic assisted vaginal hysterectomy  03/27/2011    Procedure: LAPAROSCOPIC ASSISTED VAGINAL HYSTERECTOMY;  Surgeon: Cyril Mourning, MD;  Location: [facility];  Service: Gynecology;  Laterality: N/A;  . Salpingoophorectomy  03/27/2011    Procedure: SALPINGO OOPHERECTOMY;  Surgeon: Cyril Mourning,  MD;  Location: [facility];  Service: Gynecology;  Laterality: Bilateral;   Family History  Problem Relation Age of Onset  . Hypertension Other     entire family on both sides  . Hyperlipidemia Father     fathers side of family  . Asthma Son   . Asthma Son   . Clotting disorder Maternal Uncle   . Clotting disorder Paternal Grandmother   . Heart disease Maternal Grandfather    History  Substance Use Topics  . Smoking status: Former Smoker -- 1.00 packs/day for 15 years    Types: Cigarettes    Quit date: 03/18/2004  . Smokeless tobacco: Never Used  . Alcohol Use: No   OB History   Grav Para Term Preterm Abortions TAB SAB Ect Mult Living                 Review of Systems  Constitutional: Negative for fever and chills.  HENT: Negative for congestion, rhinorrhea and sore throat.   Eyes: Negative for photophobia and visual disturbance.  Respiratory: Negative for cough and shortness of breath.   Cardiovascular: Negative for chest pain and leg swelling.  Gastrointestinal: Negative for nausea, vomiting, abdominal pain, diarrhea and constipation.  Endocrine: Negative for polyphagia and polyuria.  Genitourinary: Negative for dysuria, flank pain, vaginal bleeding, vaginal discharge and enuresis.  Musculoskeletal: Positive for stiffness. Negative for back pain and gait problem.  Skin:  Negative for color change and rash.  Neurological: Negative for dizziness, syncope, light-headedness and numbness.  Hematological: Negative for adenopathy. Does not bruise/bleed easily.  All other systems reviewed and are negative.     Allergies  Review of patient's allergies indicates no known allergies.  Home Medications   Prior to Admission medications   Medication Sig Start Date End Date Taking? Authorizing Provider  ALPRAZolam Duanne Moron) 1 MG tablet Take 1 mg by mouth at bedtime as needed.    Historical Provider, MD  amLODipine (NORVASC) 5 MG tablet Take 5 mg by mouth daily.     Historical Provider,  MD  bifidobacterium infantis (ALIGN) capsule Take 1 capsule by mouth daily. 09/16/12   Janett Billow D. Zehr, PA-C  calcium carbonate (OS-CAL) 600 MG TABS Take 600 mg by mouth daily.    Historical Provider, MD  clidinium-chlordiazePOXIDE (LIBRAX) 2.5-5 MG per capsule Take 1 capsule by mouth 3 (three) times daily before meals. 09/30/12   Irene Shipper, MD  clindamycin (CLEOCIN) 300 MG capsule Take 1 capsule (300 mg total) by mouth 3 (three) times daily. If you develop signs of infection as discussed.  Do not take for other conditions 01/07/14   Debby Freiberg, MD  clonazePAM (KLONOPIN) 1 MG tablet Take 1 mg by mouth. 2 in the morning  2 at bedtime    Historical Provider, MD  diclofenac (VOLTAREN) 75 MG EC tablet Take 1 tablet (75 mg total) by mouth 2 (two) times daily. 04/19/13   Wallene Huh, DPM  DULoxetine HCl (CYMBALTA PO) Take 120 mg by mouth daily.     Historical Provider, MD  ezetimibe (ZETIA) 10 MG tablet Take 10 mg by mouth daily.     Historical Provider, MD  fluconazole (DIFLUCAN) 200 MG tablet Take 1 tablet (200 mg total) by mouth daily. 01/07/14 01/14/14  Debby Freiberg, MD  ibuprofen (ADVIL,MOTRIN) 200 MG tablet Take 600 mg by mouth every 6 (six) hours as needed. For pain.    Historical Provider, MD  lansoprazole (PREVACID) 30 MG capsule Take 30 mg by mouth daily as needed. 12/24/11 12/23/12  Malvin Johns, MD  levothyroxine (SYNTHROID, LEVOTHROID) 150 MCG tablet Take 150 mcg by mouth daily before breakfast.    Historical Provider, MD  Multiple Vitamins-Minerals (MULTIVITAMIN WITH MINERALS) tablet Take 1 tablet by mouth daily.     Historical Provider, MD  ondansetron (ZOFRAN) 4 MG tablet Take one tablet every 4-6 hours as needed for nausea 09/30/12   Irene Shipper, MD  ondansetron (ZOFRAN) 4 MG tablet Take 1 tablet (4 mg total) by mouth every 8 (eight) hours as needed for nausea or vomiting. 01/07/14   Debby Freiberg, MD  oxyCODONE-acetaminophen (PERCOCET/ROXICET) 5-325 MG per tablet Take 1 tablet by  mouth every 4 (four) hours as needed for moderate pain or severe pain. 01/07/14   Debby Freiberg, MD  rosuvastatin (CRESTOR) 10 MG tablet Take 10 mg by mouth daily.      Historical Provider, MD  traZODone (DESYREL) 50 MG tablet Take 50 mg by mouth at bedtime.    Historical Provider, MD  vitamin C (ASCORBIC ACID) 500 MG tablet Take 500 mg by mouth daily.     Historical Provider, MD  Vitamin D, Ergocalciferol, (DRISDOL) 50000 UNITS CAPS Take 50,000 Units by mouth every 7 (seven) days. tuesdays    Historical Provider, MD   BP 149/88  Pulse 77  Temp(Src) 98.1 F (36.7 C) (Oral)  Resp 18  Ht 5\' 9"  (1.753 m)  Wt 230 lb (104.327 kg)  BMI 33.95 kg/m2  SpO2 100%  LMP 03/19/2011 Physical Exam  Vitals reviewed. Constitutional: She is oriented to person, place, and time. She appears well-developed and well-nourished.  HENT:  Head: Normocephalic and atraumatic.  Right Ear: External ear normal.  Left Ear: External ear normal.  Eyes: Conjunctivae and EOM are normal. Pupils are equal, round, and reactive to light.  Neck: Normal range of motion. Neck supple.  Cardiovascular: Normal rate, regular rhythm, normal heart sounds and intact distal pulses.   Pulmonary/Chest: Effort normal and breath sounds normal.  Abdominal: Soft. Bowel sounds are normal. There is no tenderness.  Musculoskeletal: Normal range of motion.       Left foot: She exhibits swelling (mild).  Tenderness over 5th metatarsal, puncture wound between 4th and 5th digits of L foot, hemostatic  Neurological: She is alert and oriented to person, place, and time.  Skin: Skin is warm and dry.    ED Course  Procedures (including critical care time) Labs Review Labs Reviewed - No data to display  Imaging Review Dg Foot Complete Left  01/07/2014   CLINICAL DATA:  Puncture wound from dog crate between the fourth and fifth toes, foot injury  EXAM: LEFT FOOT - COMPLETE 3+ VIEW  COMPARISON:  03/27/2012  FINDINGS: Osseous mineralization  normal.  Joint spaces preserved.  No fracture, dislocation, or bone destruction.  Tiny plantar calcaneal spur.  No radiopaque foreign bodies or soft tissue gas.  IMPRESSION: No acute abnormalities.   Electronically Signed   By: Lavonia Dana M.D.   On: 01/07/2014 09:22     EKG Interpretation None      MDM   Final diagnoses:  Puncture wound of foot, left, initial encounter    38 y.o. female  with pertinent PMH of HTN, anxiety presents with puncture wound to left foot incurred last night after stepping on a part of her dog cage.  She presents today due to continued pain and nausea. She's not had fever, vomiting, diarrhea, or other infectious symptoms. She has a history of prior plantar fasciitis for which she's seeing podiatry and has followed there. She irrigated the wound copiously last night and doxepin water. On arrival to the puncture wound is as above without signs of cellulitis or infection. The patient has tenderness over her lateral foot, however is neurovascularly intact and has no signs of acute bony injury.   Labs and imaging as above reviewed. X-ray negative for acute pathology. Wound appears clean this time and patient irrigated wound last night so will not repeat irrigate today. She was given strict return precautions for infection.  I supplied her with a prescription for clindamycin which I told her to take for any signs of increasing redness, systemic symptoms, or other signs of infection. Patient voiced understanding and agreed to followup.  1. Puncture wound of foot, left, initial encounter         Debby Freiberg, MD 01/07/14 1001

## 2014-01-07 NOTE — ED Notes (Signed)
Pt reports hook on cage caught foot on left pinky toe.

## 2014-01-07 NOTE — Discharge Instructions (Signed)
Puncture Wound °A puncture wound is an injury that extends through all layers of the skin and into the tissue beneath the skin (subcutaneous tissue). Puncture wounds become infected easily because germs often enter the body and go beneath the skin during the injury. Having a deep wound with a small entrance point makes it difficult for your caregiver to adequately clean the wound. This is especially true if you have stepped on a nail and it has passed through a dirty shoe or other situations where the wound is obviously contaminated. °CAUSES  °Many puncture wounds involve glass, nails, splinters, fish hooks, or other objects that enter the skin (foreign bodies). A puncture wound may also be caused by a human bite or animal bite. °DIAGNOSIS  °A puncture wound is usually diagnosed by your history and a physical exam. You may need to have an X-ray or an ultrasound to check for any foreign bodies still in the wound. °TREATMENT  °· Your caregiver will clean the wound as thoroughly as possible. Depending on the location of the wound, a bandage (dressing) may be applied. °· Your caregiver might prescribe antibiotic medicines. °· You may need a follow-up visit to check on your wound. Follow all instructions as directed by your caregiver. °HOME CARE INSTRUCTIONS  °· Change your dressing once per day, or as directed by your caregiver. If the dressing sticks, it may be removed by soaking the area in water. °· If your caregiver has given you follow-up instructions, it is very important that you return for a follow-up appointment. Not following up as directed could result in a chronic or permanent injury, pain, and disability. °· Only take over-the-counter or prescription medicines for pain, discomfort, or fever as directed by your caregiver. °· If you are given antibiotics, take them as directed. Finish them even if you start to feel better. °You may need a tetanus shot if: °· You cannot remember when you had your last tetanus  shot. °· You have never had a tetanus shot. °If you got a tetanus shot, your arm may swell, get red, and feel warm to the touch. This is common and not a problem. If you need a tetanus shot and you choose not to have one, there is a rare chance of getting tetanus. Sickness from tetanus can be serious. °You may need a rabies shot if an animal bite caused your puncture wound. °SEEK MEDICAL CARE IF:  °· You have redness, swelling, or increasing pain in the wound. °· You have red streaks going away from the wound. °· You notice a bad smell coming from the wound or dressing. °· You have yellowish-white fluid (pus) coming from the wound. °· You are treated with an antibiotic for infection, but the infection is not getting better. °· You notice something in the wound, such as rubber from your shoe, cloth, or another object. °· You have a fever. °· You have severe pain. °· You have difficulty breathing. °· You feel dizzy or faint. °· You cannot stop vomiting. °· You lose feeling, develop numbness, or cannot move a limb below the wound. °· Your symptoms worsen. °MAKE SURE YOU: °· Understand these instructions. °· Will watch your condition. °· Will get help right away if you are not doing well or get worse. °Document Released: 02/13/2005 Document Revised: 07/29/2011 Document Reviewed: 10/23/2010 °[employer]® Patient Information ©2015 [employer]. This information is not intended to replace advice given to you by your health care provider. Make sure you discuss any questions you   have with your health care provider. ° °

## 2014-02-04 ENCOUNTER — Other Ambulatory Visit: Payer: Self-pay | Admitting: Obstetrics and Gynecology

## 2014-02-07 LAB — CYTOLOGY - PAP

## 2014-08-19 ENCOUNTER — Emergency Department (HOSPITAL_BASED_OUTPATIENT_CLINIC_OR_DEPARTMENT_OTHER): Payer: PPO

## 2014-08-19 ENCOUNTER — Emergency Department (HOSPITAL_BASED_OUTPATIENT_CLINIC_OR_DEPARTMENT_OTHER)
Admission: EM | Admit: 2014-08-19 | Discharge: 2014-08-19 | Disposition: A | Discharge status: Home / Self Care | Payer: PPO | Attending: Emergency Medicine | Admitting: Emergency Medicine

## 2014-08-19 ENCOUNTER — Encounter (HOSPITAL_BASED_OUTPATIENT_CLINIC_OR_DEPARTMENT_OTHER): Payer: Self-pay

## 2014-08-19 DIAGNOSIS — Z87891 Personal history of nicotine dependence: Secondary | ICD-10-CM | POA: Insufficient documentation

## 2014-08-19 DIAGNOSIS — K219 Gastro-esophageal reflux disease without esophagitis: Secondary | ICD-10-CM | POA: Insufficient documentation

## 2014-08-19 DIAGNOSIS — R079 Chest pain, unspecified: Secondary | ICD-10-CM

## 2014-08-19 DIAGNOSIS — R0789 Other chest pain: Secondary | ICD-10-CM | POA: Diagnosis not present

## 2014-08-19 DIAGNOSIS — R0602 Shortness of breath: Secondary | ICD-10-CM | POA: Diagnosis not present

## 2014-08-19 DIAGNOSIS — E039 Hypothyroidism, unspecified: Secondary | ICD-10-CM | POA: Insufficient documentation

## 2014-08-19 DIAGNOSIS — F329 Major depressive disorder, single episode, unspecified: Secondary | ICD-10-CM | POA: Diagnosis not present

## 2014-08-19 DIAGNOSIS — I10 Essential (primary) hypertension: Secondary | ICD-10-CM | POA: Insufficient documentation

## 2014-08-19 DIAGNOSIS — M1990 Unspecified osteoarthritis, unspecified site: Secondary | ICD-10-CM | POA: Diagnosis not present

## 2014-08-19 DIAGNOSIS — Z862 Personal history of diseases of the blood and blood-forming organs and certain disorders involving the immune mechanism: Secondary | ICD-10-CM | POA: Diagnosis not present

## 2014-08-19 DIAGNOSIS — Z791 Long term (current) use of non-steroidal anti-inflammatories (NSAID): Secondary | ICD-10-CM | POA: Insufficient documentation

## 2014-08-19 DIAGNOSIS — Z86711 Personal history of pulmonary embolism: Secondary | ICD-10-CM | POA: Diagnosis not present

## 2014-08-19 DIAGNOSIS — E785 Hyperlipidemia, unspecified: Secondary | ICD-10-CM | POA: Diagnosis not present

## 2014-08-19 DIAGNOSIS — F419 Anxiety disorder, unspecified: Secondary | ICD-10-CM | POA: Insufficient documentation

## 2014-08-19 DIAGNOSIS — Z79899 Other long term (current) drug therapy: Secondary | ICD-10-CM | POA: Diagnosis not present

## 2014-08-19 LAB — CBC WITH DIFFERENTIAL/PLATELET
Basophils Absolute: 0 10*3/uL (ref 0.0–0.1)
Basophils Relative: 0 % (ref 0–1)
Eosinophils Absolute: 0.3 10*3/uL (ref 0.0–0.7)
Eosinophils Relative: 4 % (ref 0–5)
HCT: 40.8 % (ref 36.0–46.0)
Hemoglobin: 13.8 g/dL (ref 12.0–15.0)
Lymphocytes Relative: 36 % (ref 12–46)
Lymphs Abs: 2.6 10*3/uL (ref 0.7–4.0)
MCH: 29.4 pg (ref 26.0–34.0)
MCHC: 33.8 g/dL (ref 30.0–36.0)
MCV: 86.8 fL (ref 78.0–100.0)
Monocytes Absolute: 0.5 10*3/uL (ref 0.1–1.0)
Monocytes Relative: 7 % (ref 3–12)
Neutro Abs: 3.7 10*3/uL (ref 1.7–7.7)
Neutrophils Relative %: 53 % (ref 43–77)
Platelets: 230 10*3/uL (ref 150–400)
RBC: 4.7 MIL/uL (ref 3.87–5.11)
RDW: 12.5 % (ref 11.5–15.5)
WBC: 7.1 10*3/uL (ref 4.0–10.5)

## 2014-08-19 LAB — COMPREHENSIVE METABOLIC PANEL
ALT: 40 U/L — ABNORMAL HIGH (ref 0–35)
AST: 29 U/L (ref 0–37)
Albumin: 3.9 g/dL (ref 3.5–5.2)
Alkaline Phosphatase: 86 U/L (ref 39–117)
Anion gap: 6 (ref 5–15)
BUN: 12 mg/dL (ref 6–23)
CO2: 29 mmol/L (ref 19–32)
Calcium: 9.5 mg/dL (ref 8.4–10.5)
Chloride: 103 mmol/L (ref 96–112)
Creatinine, Ser: 0.84 mg/dL (ref 0.50–1.10)
GFR calc Af Amer: >90 mL/min (ref >90)
GFR calc non Af Amer: 86 mL/min — ABNORMAL LOW (ref >90)
Glucose, Bld: 119 mg/dL — ABNORMAL HIGH (ref 70–99)
Potassium: 4.2 mmol/L (ref 3.5–5.1)
Sodium: 138 mmol/L (ref 135–145)
Total Bilirubin: 0.3 mg/dL (ref 0.3–1.2)
Total Protein: 7 g/dL (ref 6.0–8.3)

## 2014-08-19 LAB — URINE MICROSCOPIC-ADD ON

## 2014-08-19 LAB — URINALYSIS, ROUTINE W REFLEX MICROSCOPIC
Bilirubin Urine: NEGATIVE
Glucose, UA: NEGATIVE mg/dL
Hgb urine dipstick: NEGATIVE
Ketones, ur: NEGATIVE mg/dL
Nitrite: NEGATIVE
Protein, ur: NEGATIVE mg/dL
Specific Gravity, Urine: 1.022 (ref 1.005–1.030)
Urobilinogen, UA: 0.2 mg/dL (ref 0.0–1.0)
pH: 5.5 (ref 5.0–8.0)

## 2014-08-19 LAB — TROPONIN I
Troponin I: <0.03 ng/mL (ref <0.031)
Troponin I: <0.03 ng/mL (ref <0.031)

## 2014-08-19 LAB — LIPASE, BLOOD: Lipase: 34 U/L (ref 11–59)

## 2014-08-19 LAB — D-DIMER, QUANTITATIVE: D-Dimer, Quant: <0.27 ug/mL-FEU (ref 0.00–0.48)

## 2014-08-19 MED ORDER — SODIUM CHLORIDE 0.9 % IV BOLUS (SEPSIS)
1000.0000 mL | Freq: Once | INTRAVENOUS | Status: AC
  Administered 2014-08-19: 1000 mL via INTRAVENOUS

## 2014-08-19 MED ORDER — PANTOPRAZOLE SODIUM 40 MG PO TBEC
40.0000 mg | DELAYED_RELEASE_TABLET | Freq: Once | ORAL | Status: AC
  Administered 2014-08-19: 40 mg via ORAL
  Filled 2014-08-19: qty 1

## 2014-08-19 MED ORDER — ASPIRIN 325 MG PO TABS
325.0000 mg | ORAL_TABLET | Freq: Once | ORAL | Status: AC
  Administered 2014-08-19: 325 mg via ORAL
  Filled 2014-08-19: qty 1

## 2014-08-19 MED ORDER — GI COCKTAIL ~~LOC~~
30.0000 mL | Freq: Once | ORAL | Status: AC
  Administered 2014-08-19: 30 mL via ORAL
  Filled 2014-08-19: qty 30

## 2014-08-19 NOTE — ED Provider Notes (Signed)
CSN: [id number]     Arrival date & time 08/19/14  0806 History   First MD Initiated Contact with Patient 08/19/14 [phone]     Chief Complaint  Patient presents with  . Chest Pain     (Consider location/radiation/quality/duration/timing/severity/associated sxs/prior Treatment) Patient is a 39 y.o. female presenting with chest pain.  Chest Pain Pain location:  Substernal area Pain quality: pressure   Pain radiates to:  Does not radiate Pain radiates to the back: no   Pain severity:  Moderate Onset quality:  Gradual Duration:  2 hours Timing:  Constant Progression:  Worsening Chronicity:  New Context comment:  With indigestion symptoms Relieved by:  Nothing Worsened by:  Nothing tried Associated symptoms: heartburn and shortness of breath   Associated symptoms: no abdominal pain and no fever     Past Medical History  Diagnosis Date  . Hypertension   . MVP (mitral valve prolapse)     echo per dr Dagmar Hait  . Anxiety   . Depression   . Headache(784.0)   . Protein S deficiency 2003    dvt/pulmonary embolus-on coumadin for 6 months then off-Sees [facility]  . Arthritis     ruptured lumbar disc-careful with positioning  . Hypothyroidism   . Blood dyscrasia     protein s deficiency-no treatment since 2006  . Hyperlipemia   . IBS (irritable bowel syndrome)   . GERD (gastroesophageal reflux disease)   . Hemorrhoids   . Pulmonary embolism   . PVC (premature ventricular contraction)    Past Surgical History  Procedure Laterality Date  . Vaginal reconstructive surgery  2001  . Cesarean section    . Tonsillectomy  92  . Laparoscopic assisted vaginal hysterectomy  03/27/2011    Procedure: LAPAROSCOPIC ASSISTED VAGINAL HYSTERECTOMY;  Surgeon: Cyril Mourning, MD;  Location: [facility];  Service: Gynecology;  Laterality: N/A;  . Salpingoophorectomy  03/27/2011    Procedure: SALPINGO OOPHERECTOMY;  Surgeon: Cyril Mourning, MD;  Location: [facility];  Service: Gynecology;  Laterality:  Bilateral;   Family History  Problem Relation Age of Onset  . Hypertension Other     entire family on both sides  . Hyperlipidemia Father     fathers side of family  . Asthma Son   . Asthma Son   . Clotting disorder Maternal Uncle   . Clotting disorder Paternal Grandmother   . Heart disease Maternal Grandfather    History  Substance Use Topics  . Smoking status: Former Smoker -- 1.00 packs/day for 15 years    Types: Cigarettes    Quit date: 03/18/2004  . Smokeless tobacco: Never Used  . Alcohol Use: No   OB History    No data available     Review of Systems  Constitutional: Negative for fever.  Respiratory: Positive for shortness of breath.   Cardiovascular: Positive for chest pain.  Gastrointestinal: Positive for heartburn. Negative for abdominal pain.  All other systems reviewed and are negative.     Allergies  Review of patient's allergies indicates no known allergies.  Home Medications   Prior to Admission medications   Medication Sig Start Date End Date Taking? Authorizing Provider  ALPRAZolam Duanne Moron) 1 MG tablet Take 1 mg by mouth at bedtime as needed.    Historical Provider, MD  amLODipine (NORVASC) 5 MG tablet Take 5 mg by mouth daily.     Historical Provider, MD  bifidobacterium infantis (ALIGN) capsule Take 1 capsule by mouth daily. 09/16/12   Loralie Champagne, PA-C  calcium carbonate (OS-CAL) 600 MG TABS Take 600 mg by mouth daily.    Historical Provider, MD  clidinium-chlordiazePOXIDE (LIBRAX) 2.5-5 MG per capsule Take 1 capsule by mouth 3 (three) times daily before meals. 09/30/12   Irene Shipper, MD  clindamycin (CLEOCIN) 300 MG capsule Take 1 capsule (300 mg total) by mouth 3 (three) times daily. If you develop signs of infection as discussed.  Do not take for other conditions 01/07/14   Debby Freiberg, MD  clonazePAM (KLONOPIN) 1 MG tablet Take 1 mg by mouth. 2 in the morning  2 at bedtime    Historical Provider, MD  diclofenac (VOLTAREN) 75 MG EC tablet  Take 1 tablet (75 mg total) by mouth 2 (two) times daily. 04/19/13   Wallene Huh, DPM  DULoxetine HCl (CYMBALTA PO) Take 120 mg by mouth daily.     Historical Provider, MD  ezetimibe (ZETIA) 10 MG tablet Take 10 mg by mouth daily.     Historical Provider, MD  ibuprofen (ADVIL,MOTRIN) 200 MG tablet Take 600 mg by mouth every 6 (six) hours as needed. For pain.    Historical Provider, MD  lansoprazole (PREVACID) 30 MG capsule Take 30 mg by mouth daily as needed. 12/24/11 12/23/12  Malvin Johns, MD  levothyroxine (SYNTHROID, LEVOTHROID) 150 MCG tablet Take 150 mcg by mouth daily before breakfast.    Historical Provider, MD  Multiple Vitamins-Minerals (MULTIVITAMIN WITH MINERALS) tablet Take 1 tablet by mouth daily.     Historical Provider, MD  ondansetron (ZOFRAN) 4 MG tablet Take one tablet every 4-6 hours as needed for nausea 09/30/12   Irene Shipper, MD  ondansetron (ZOFRAN) 4 MG tablet Take 1 tablet (4 mg total) by mouth every 8 (eight) hours as needed for nausea or vomiting. 01/07/14   Debby Freiberg, MD  oxyCODONE-acetaminophen (PERCOCET/ROXICET) 5-325 MG per tablet Take 1 tablet by mouth every 4 (four) hours as needed for moderate pain or severe pain. 01/07/14   Debby Freiberg, MD  rosuvastatin (CRESTOR) 10 MG tablet Take 10 mg by mouth daily.      Historical Provider, MD  traZODone (DESYREL) 50 MG tablet Take 50 mg by mouth at bedtime.    Historical Provider, MD  vitamin C (ASCORBIC ACID) 500 MG tablet Take 500 mg by mouth daily.     Historical Provider, MD  Vitamin D, Ergocalciferol, (DRISDOL) 50000 UNITS CAPS Take 50,000 Units by mouth every 7 (seven) days. tuesdays    Historical Provider, MD   BP 131/76 mmHg  Pulse 69  Temp(Src) 98.5 F (36.9 C) (Oral)  Resp 18  Ht 5\' 9"  (1.753 m)  Wt 220 lb (99.791 kg)  BMI 32.47 kg/m2  SpO2 100%  LMP 03/19/2011 Physical Exam  Constitutional: She is oriented to person, place, and time. She appears well-developed and well-nourished.  HENT:  Head:  Normocephalic and atraumatic.  Right Ear: External ear normal.  Left Ear: External ear normal.  Eyes: Conjunctivae and EOM are normal. Pupils are equal, round, and reactive to light.  Neck: Normal range of motion. Neck supple.  Cardiovascular: Normal rate, regular rhythm, normal heart sounds and intact distal pulses.   Pulmonary/Chest: Effort normal and breath sounds normal.  Abdominal: Soft. Bowel sounds are normal. There is no tenderness.  Musculoskeletal: Normal range of motion.  Neurological: She is alert and oriented to person, place, and time.  Skin: Skin is warm and dry.  Vitals reviewed.   ED Course  Procedures (including critical care time) Labs Review Labs Reviewed  COMPREHENSIVE METABOLIC PANEL - Abnormal; Notable for the following:    Glucose, Bld 119 (*)    ALT 40 (*)    GFR calc non Af Amer 86 (*)    All other components within normal limits  URINALYSIS, ROUTINE W REFLEX MICROSCOPIC - Abnormal; Notable for the following:    Leukocytes, UA SMALL (*)    All other components within normal limits  URINE MICROSCOPIC-ADD ON - Abnormal; Notable for the following:    Squamous Epithelial / LPF FEW (*)    Bacteria, UA MANY (*)    All other components within normal limits  CBC WITH DIFFERENTIAL/PLATELET  LIPASE, BLOOD  TROPONIN I  D-DIMER, QUANTITATIVE  TROPONIN I    Imaging Review Dg Chest 2 View  08/19/2014   CLINICAL DATA:  Chest pain and acid reflux  EXAM: CHEST  2 VIEW  COMPARISON:  02/22/2009  FINDINGS: Normal heart size and mediastinal contours. No acute infiltrate or edema. No effusion or pneumothorax. No acute osseous findings.  IMPRESSION: Negative chest.   Electronically Signed   By: Monte Fantasia M.D.   On: 08/19/2014 08:48     EKG Interpretation   Date/Time:  Friday August 19 2014 08:14:32 EDT Ventricular Rate:  69 PR Interval:  166 QRS Duration: 90 QT Interval:  396 QTC Calculation: 424 R Axis:   23 Text Interpretation:  Normal sinus rhythm Low  voltage QRS Borderline ECG  No significant change since last tracing Confirmed by Debby Freiberg  [phone]) on 08/19/2014 8:26:35 AM      MDM   Final diagnoses:  Chest pain, unspecified chest pain type    39 y.o. female with pertinent PMH of protein s def with prior anticoagulation, HTN, HLD presents with chest pain as above.  Atypical/non cardiac history.  On arrival pt has vitals and physical exam as above.  Benign exam.  Symptoms relieved with GI cocktail.  Wu unremarkable.  Likely gastric etiology.  Stable to dc home with cardiology fu.    I have reviewed all laboratory and imaging studies if ordered as above  1. Chest pain, unspecified chest pain type         Debby Freiberg, MD 08/20/14 0930

## 2014-08-19 NOTE — ED Notes (Signed)
Pt requests new Primary Care Doctor ,  Dr. Gildardo Cranker DO , [facility].   [phone].  Will have access to recent tests/ results

## 2014-08-19 NOTE — ED Notes (Signed)
Pt arrives via wheelchair.  Pt reports indigestion started a 4 days ago, this am at 0600 felt discomfort in chest, then pressure.  Recent acid reflux, making her hoarse, started taking Zantac but its not helping.  She states she 's had some SOB, dizziness at times.  No N/V at this time.  Hx HTN, high Cholesterol, blood clot in 2003, hospitilaized.  Family hx of cardiac issues.

## 2014-08-19 NOTE — ED Notes (Signed)
Pt requested another visit from Dr. Colin Rhein.   She was upset previous doctor's info was on D/C instructions.  Reviewed D/C instructions with her, and her F/U appointments c Primary care,  GI md, and [facility] #'s given.  She verbalizes understanding of d/c instructions and importance of f/U.

## 2014-08-19 NOTE — Discharge Instructions (Signed)

## 2014-09-07 ENCOUNTER — Encounter: Payer: Self-pay | Admitting: Internal Medicine

## 2014-09-07 ENCOUNTER — Ambulatory Visit (INDEPENDENT_AMBULATORY_CARE_PROVIDER_SITE_OTHER): Payer: PPO | Admitting: Internal Medicine

## 2014-09-07 VITALS — BP 130/78 | HR 67 | Temp 98.6°F

## 2014-09-07 DIAGNOSIS — G44209 Tension-type headache, unspecified, not intractable: Secondary | ICD-10-CM | POA: Diagnosis not present

## 2014-09-07 DIAGNOSIS — K219 Gastro-esophageal reflux disease without esophagitis: Secondary | ICD-10-CM | POA: Diagnosis not present

## 2014-09-07 DIAGNOSIS — F4010 Social phobia, unspecified: Secondary | ICD-10-CM

## 2014-09-07 DIAGNOSIS — H6592 Unspecified nonsuppurative otitis media, left ear: Secondary | ICD-10-CM | POA: Diagnosis not present

## 2014-09-07 DIAGNOSIS — F331 Major depressive disorder, recurrent, moderate: Secondary | ICD-10-CM

## 2014-09-07 DIAGNOSIS — E038 Other specified hypothyroidism: Secondary | ICD-10-CM

## 2014-09-07 DIAGNOSIS — M545 Low back pain, unspecified: Secondary | ICD-10-CM

## 2014-09-07 DIAGNOSIS — F429 Obsessive-compulsive disorder, unspecified: Secondary | ICD-10-CM

## 2014-09-07 DIAGNOSIS — F42 Obsessive-compulsive disorder: Secondary | ICD-10-CM

## 2014-09-07 DIAGNOSIS — E785 Hyperlipidemia, unspecified: Secondary | ICD-10-CM

## 2014-09-07 DIAGNOSIS — M5416 Radiculopathy, lumbar region: Secondary | ICD-10-CM

## 2014-09-07 DIAGNOSIS — E034 Atrophy of thyroid (acquired): Secondary | ICD-10-CM | POA: Diagnosis not present

## 2014-09-07 DIAGNOSIS — G8929 Other chronic pain: Secondary | ICD-10-CM

## 2014-09-07 MED ORDER — MONTELUKAST SODIUM 10 MG PO TABS: 10.0000 mg | ORAL_TABLET | Freq: Every day | ORAL | Status: DC

## 2014-09-07 MED ORDER — OXYCODONE-ACETAMINOPHEN 5-325 MG PO TABS: 1.0000 | ORAL_TABLET | Freq: Four times a day (QID) | ORAL | Status: DC | PRN

## 2014-09-07 MED ORDER — ESOMEPRAZOLE MAGNESIUM 40 MG PO CPDR: 40.0000 mg | DELAYED_RELEASE_CAPSULE | Freq: Every day | ORAL | Status: DC

## 2014-09-07 NOTE — Progress Notes (Signed)
Patient ID: Andrea [name], female   DOB: January 03, 1976, 39 y.o.   MRN: [id number]    Facility  [facility]    Place of Service:   OFFICE    No Known Allergies  Chief Complaint  Patient presents with  . Establish Care    New patient establish care, dizziness and headaches, chronic concern    HPI:  39 yo female seen today as a new pt. She is c/a HA and dizziness intermittent x 3 mos. Things feel like her surroundings "shift".Tried aleve which helps a little. Tried claritin without relief.   Hx thyroid and does not take pseudofed.  BP stable at home on lasix, losartan and lopressor, .  She was seen in the ER in [place] for CP and tx with GI cocktail which helped. She is waiting to schedule f/u with GI.  She sees psych for OCD/social anxiety/depression. She as an autistic son.  Past Medical History  Diagnosis Date  . Hypertension   . MVP (mitral valve prolapse)     echo per dr Dagmar Hait  . Anxiety   . Depression   . Headache(784.0)   . Protein S deficiency 2003    dvt/pulmonary embolus-on coumadin for 6 months then off-Sees [facility]  . Arthritis     ruptured lumbar disc-careful with positioning  . Hypothyroidism   . Blood dyscrasia     protein s deficiency-no treatment since 2006  . Hyperlipemia   . IBS (irritable bowel syndrome)   . GERD (gastroesophageal reflux disease)   . Hemorrhoids   . Pulmonary embolism   . PVC (premature ventricular contraction)   . High cholesterol    Past Surgical History  Procedure Laterality Date  . Vaginal reconstructive surgery  2001  . Cesarean section  2006  . Tonsillectomy  92  . Laparoscopic assisted vaginal hysterectomy  03/27/2011    Procedure: LAPAROSCOPIC ASSISTED VAGINAL HYSTERECTOMY;  Surgeon: Cyril Mourning, MD;  Location: [facility];  Service: Gynecology;  Laterality: N/A;  . Salpingoophorectomy  03/27/2011    Procedure: SALPINGO OOPHERECTOMY;  Surgeon: Cyril Mourning, MD;  Location: [facility];  Service: Gynecology;  Laterality:  Bilateral;   History   Social History  . Marital Status: Legally Separated    Spouse Name: N/A  . Number of Children: 2  . Years of Education: N/A   Occupational History  . nutrition [place] History Main Topics  . Smoking status: Former Smoker -- 1.00 packs/day for 15 years    Types: Cigarettes    Quit date: 03/18/2004  . Smokeless tobacco: Never Used  . Alcohol Use: No  . Drug Use: No  . Sexual Activity: Not on file   Other Topics Concern  . None   Social History Narrative   Diet: Regular   Caffeine: Yes   Married- divorced, married in 2006   House: Yes, 2 persons   Pets: 1 dog   Current/Past profession: [employer] 2002-2013   Exercise: Yes, walking   Living Will: No    DNR: No    POA/HPOA: No          Family History  Problem Relation Age of Onset  . Hypertension Other     entire family on both sides  . Hyperlipidemia Father     fathers side of family  . Asthma Son   . Asthma Son   . Clotting disorder Maternal Uncle   . Clotting disorder Paternal Grandmother   . Heart disease Maternal Grandfather   .  High blood pressure Father   . High blood pressure Mother      Medications: Patient's Medications  New Prescriptions   No medications on file  Previous Medications   ALPRAZOLAM (XANAX) 1 MG TABLET    Take 1 mg by mouth at bedtime as needed.   ASPIRIN EC 81 MG TABLET    Take 81 mg by mouth daily.   BUPROPION (WELLBUTRIN SR) 150 MG 12 HR TABLET    Take 150 mg by mouth daily.   CALCIUM CARBONATE (OS-CAL) 600 MG TABS    Take 600 mg by mouth daily.   CLONAZEPAM (KLONOPIN) 1 MG TABLET    Take 1 mg by mouth 4 (four) times daily.   COENZYME Q10 (COQ10) 100 MG CAPS    Take by mouth daily.   EZETIMIBE (ZETIA) 10 MG TABLET    Take 10 mg by mouth daily.    FUROSEMIDE (LASIX) 20 MG TABLET    Take 20 mg by mouth daily.   LAMOTRIGINE (LAMICTAL) 150 MG TABLET    Take 150 mg by mouth daily.   LANSOPRAZOLE (PREVACID) 30 MG CAPSULE    Take 30  mg by mouth daily as needed.   LEVOTHYROXINE (SYNTHROID, LEVOTHROID) 150 MCG TABLET    Take 150 mcg by mouth daily before breakfast.   LOSARTAN (COZAAR) 100 MG TABLET    Take 100 mg by mouth daily.   METOPROLOL TARTRATE (LOPRESSOR) 25 MG TABLET    Take 25 mg by mouth 2 (two) times daily.   MULTIPLE VITAMINS-MINERALS (MULTIVITAMIN WITH MINERALS) TABLET    Take 1 tablet by mouth daily.    OXYCODONE-ACETAMINOPHEN (PERCOCET/ROXICET) 5-325 MG PER TABLET    Take 1 tablet by mouth every 6 (six) hours as needed for moderate pain or severe pain.   ROSUVASTATIN (CRESTOR) 10 MG TABLET    Take 10 mg by mouth daily.     SERTRALINE (ZOLOFT) 100 MG TABLET    Take 100 mg by mouth daily.   TOPIRAMATE (TOPAMAX) 50 MG TABLET    50 mg. 1-3 by mouth at bedtime   VITAMIN C (ASCORBIC ACID) 500 MG TABLET    Take 500 mg by mouth daily.   VITAMIN D, ERGOCALCIFEROL, (DRISDOL) 50000 UNITS CAPS    Take 50,000 Units by mouth every 7 (seven) days. tuesdays   VITAMIN E 400 UNIT CAPSULE    Take 400 Units by mouth daily.  Modified Medications   No medications on file  Discontinued Medications   No medications on file     Review of Systems  Constitutional: Positive for diaphoresis and unexpected weight change. Negative for fever, chills, activity change, appetite change and fatigue.  HENT: Positive for sinus pressure. Negative for ear pain and sore throat.        Dry mouth, loss of taste  Eyes: Positive for itching and visual disturbance (wears glasses).  Respiratory: Positive for shortness of breath. Negative for cough and chest tightness.   Cardiovascular: Positive for chest pain and palpitations. Negative for leg swelling.  Gastrointestinal: Positive for nausea, abdominal pain and diarrhea. Negative for vomiting, constipation and blood in stool.       Flatulence  Genitourinary: Positive for dysuria and frequency.       Urinary incontinence  Musculoskeletal: Positive for back pain, joint swelling and arthralgias.    Neurological: Positive for dizziness, weakness, numbness and headaches (vise-like). Negative for tremors.       Loss of balance  Hematological: Bruises/bleeds easily.  Psychiatric/Behavioral: Positive for confusion, sleep  disturbance and dysphoric mood. The patient is nervous/anxious.     Filed Vitals:   09/07/14 0923  BP: 130/78  Pulse: 67  Temp: 98.6 F (37 C)  TempSrc: Oral  SpO2: 96%   There is no weight on file to calculate BMI.  Physical Exam  Constitutional: She is oriented to person, place, and time. She appears well-developed and well-nourished.  HENT:  Head: Normocephalic and atraumatic.  Nose: Right sinus exhibits no maxillary sinus tenderness and no frontal sinus tenderness. Left sinus exhibits no maxillary sinus tenderness and no frontal sinus tenderness.  Mouth/Throat: Oropharynx is clear and moist. No oropharyngeal exudate.  Left TM air fluid level but no redness or bulging  Eyes: Pupils are equal, round, and reactive to light. No scleral icterus.  Neck: Neck supple. No tracheal deviation present. Thyromegaly (left but no masses palpable) present.  Cardiovascular: Normal rate, regular rhythm, normal heart sounds and intact distal pulses.  Exam reveals no gallop and no friction rub.   No murmur heard. No LE edema b/l. no calf TTP. No carotid bruit b/l  Pulmonary/Chest: Effort normal and breath sounds normal. No stridor. No respiratory distress. She has no wheezes. She has no rales.  Abdominal: Soft. Bowel sounds are normal. She exhibits no distension and no mass. There is tenderness (RUQ). There is no rebound and no guarding.  Lymphadenopathy:    She has no cervical adenopathy.  Neurological: She is alert and oriented to person, place, and time. She has normal reflexes.  Skin: Skin is warm and dry. No rash noted.  Psychiatric: Her behavior is normal. Judgment and thought content normal. Her mood appears anxious.     Labs reviewed: Abstract on 09/07/2014   Component Date Value Ref Range Status  . [facility] Mammogram 05/20/2010 Normal-GYN   Final  Admission on 08/19/2014, Discharged on 08/19/2014  Component Date Value Ref Range Status  . WBC 08/19/2014 7.1  4.0 - 10.5 K/uL Final  . RBC 08/19/2014 4.70  3.87 - 5.11 MIL/uL Final  . Hemoglobin 08/19/2014 13.8  12.0 - 15.0 g/dL Final  . HCT 08/19/2014 40.8  36.0 - 46.0 % Final  . MCV 08/19/2014 86.8  78.0 - 100.0 fL Final  . MCH 08/19/2014 29.4  26.0 - 34.0 pg Final  . MCHC 08/19/2014 33.8  30.0 - 36.0 g/dL Final  . RDW 08/19/2014 12.5  11.5 - 15.5 % Final  . Platelets 08/19/2014 230  150 - 400 K/uL Final  . Neutrophils Relative % 08/19/2014 53  43 - 77 % Final  . Neutro Abs 08/19/2014 3.7  1.7 - 7.7 K/uL Final  . Lymphocytes Relative 08/19/2014 36  12 - 46 % Final  . Lymphs Abs 08/19/2014 2.6  0.7 - 4.0 K/uL Final  . Monocytes Relative 08/19/2014 7  3 - 12 % Final  . Monocytes Absolute 08/19/2014 0.5  0.1 - 1.0 K/uL Final  . Eosinophils Relative 08/19/2014 4  0 - 5 % Final  . Eosinophils Absolute 08/19/2014 0.3  0.0 - 0.7 K/uL Final  . Basophils Relative 08/19/2014 0  0 - 1 % Final  . Basophils Absolute 08/19/2014 0.0  0.0 - 0.1 K/uL Final  . Sodium 08/19/2014 138  135 - 145 mmol/L Final  . Potassium 08/19/2014 4.2  3.5 - 5.1 mmol/L Final  . Chloride 08/19/2014 103  96 - 112 mmol/L Final  . CO2 08/19/2014 29  19 - 32 mmol/L Final  . Glucose, Bld 08/19/2014 119* 70 - 99 mg/dL Final  . BUN  08/19/2014 12  6 - 23 mg/dL Final  . Creatinine, Ser 08/19/2014 0.84  0.50 - 1.10 mg/dL Final  . Calcium 08/19/2014 9.5  8.4 - 10.5 mg/dL Final  . Total Protein 08/19/2014 7.0  6.0 - 8.3 g/dL Final  . Albumin 08/19/2014 3.9  3.5 - 5.2 g/dL Final  . AST 08/19/2014 29  0 - 37 U/L Final  . ALT 08/19/2014 40* 0 - 35 U/L Final  . Alkaline Phosphatase 08/19/2014 86  39 - 117 U/L Final  . Total Bilirubin 08/19/2014 0.3  0.3 - 1.2 mg/dL Final  . GFR calc non Af Amer 08/19/2014 86* >90 mL/min Final  . GFR calc Af  Amer 08/19/2014 >90  >90 mL/min Final   Comment: (NOTE) The eGFR has been calculated using the CKD EPI equation. This calculation has not been validated in all clinical situations. eGFR's persistently <90 mL/min signify possible Chronic Kidney Disease.   . Anion gap 08/19/2014 6  5 - 15 Final  . Lipase 08/19/2014 34  11 - 59 U/L Final  . Troponin I 08/19/2014 <0.03  <0.031 ng/mL Final   Comment:        NO INDICATION OF MYOCARDIAL INJURY.   Marland Kitchen D-Dimer, Quant 08/19/2014 <0.27  0.00 - 0.48 ug/mL-FEU Final   Comment:        AT THE INHOUSE ESTABLISHED CUTOFF VALUE OF 0.48 ug/mL FEU, THIS ASSAY HAS BEEN DOCUMENTED IN THE LITERATURE TO HAVE A SENSITIVITY AND NEGATIVE PREDICTIVE VALUE OF AT LEAST 98 TO 99%.  THE TEST RESULT SHOULD BE CORRELATED WITH AN ASSESSMENT OF THE CLINICAL PROBABILITY OF DVT / VTE.   Marland Kitchen Color, Urine 08/19/2014 YELLOW  YELLOW Final  . APPearance 08/19/2014 CLEAR  CLEAR Final  . Specific Gravity, Urine 08/19/2014 1.022  1.005 - 1.030 Final  . pH 08/19/2014 5.5  5.0 - 8.0 Final  . Glucose, UA 08/19/2014 NEGATIVE  NEGATIVE mg/dL Final  . Hgb urine dipstick 08/19/2014 NEGATIVE  NEGATIVE Final  . Bilirubin Urine 08/19/2014 NEGATIVE  NEGATIVE Final  . Ketones, ur 08/19/2014 NEGATIVE  NEGATIVE mg/dL Final  . Protein, ur 08/19/2014 NEGATIVE  NEGATIVE mg/dL Final  . Urobilinogen, UA 08/19/2014 0.2  0.0 - 1.0 mg/dL Final  . Nitrite 08/19/2014 NEGATIVE  NEGATIVE Final  . Leukocytes, UA 08/19/2014 SMALL* NEGATIVE Final  . Squamous Epithelial / LPF 08/19/2014 FEW* RARE Final  . WBC, UA 08/19/2014 3-6  <3 WBC/hpf Final  . RBC / HPF 08/19/2014 0-2  <3 RBC/hpf Final  . Bacteria, UA 08/19/2014 MANY* RARE Final  . Troponin I 08/19/2014 <0.03  <0.031 ng/mL Final   Comment:        NO INDICATION OF MYOCARDIAL INJURY.      Assessment/Plan   ICD-9-CM ICD-10-CM   1. Middle ear effusion, left 381.4 H65.92   2. Tension headache probably related to increased stressors 307.81  G44.209   3. Gastroesophageal reflux disease, esophagitis presence not specified 530.81 K21.9   4. Hypothyroidism due to acquired atrophy of thyroid - with enlarged left thyroid 244.8 E03.8 Lipid panel   246.8 E03.4 TSH     T4, Free     CANCELED: TSH     CANCELED: T4, Free  5. OCD (obsessive compulsive disorder) - stable 300.3 F42   6. Major depressive disorder, recurrent episode, moderate - stable 296.32 F33.1   7. Social anxiety disorder - stable 300.23 F40.10   8. Chronic lower back pain with #9 724.2 M54.5    338.29 G89.29   9. Chronic lumbar  radiculopathy 724.4 M54.16    mx by Dr Hardin Negus  10. Hyperlipidemia LDL goal <100 - on statin 272.4 E78.5 Lipid panel     TSH     T4, Free     CANCELED: Lipid Panel    --take OTC antihistamine daily for middle ear effusion  --Rx singulair for seasonal allergy sx's  --continue other medications as ordered  --keep appt with mental health  --f/u with pain mx as scheduled  --RTO in 1 month for f/u. Will call with lab results.  Delany Steury S. Perlie Gold  [facility] [address] [place], [place] [address] [phone] Office (Wednesdays and Fridays 8 AM - 5 PM) [phone] Cell (Monday-Friday 8 AM - 5 PM)

## 2014-09-13 DIAGNOSIS — E785 Hyperlipidemia, unspecified: Secondary | ICD-10-CM

## 2014-09-13 DIAGNOSIS — E034 Atrophy of thyroid (acquired): Secondary | ICD-10-CM

## 2014-09-14 LAB — LIPID PANEL
Chol/HDL Ratio: 6.1 ratio units — ABNORMAL HIGH (ref 0.0–4.4)
Cholesterol, Total: 275 mg/dL — ABNORMAL HIGH (ref 100–199)
HDL: 45 mg/dL (ref >39)
LDL Calculated: 173 mg/dL — ABNORMAL HIGH (ref 0–99)
Triglycerides: 284 mg/dL — ABNORMAL HIGH (ref 0–149)
VLDL Cholesterol Cal: 57 mg/dL — ABNORMAL HIGH (ref 5–40)

## 2014-09-14 LAB — TSH: TSH: 1.68 u[IU]/mL (ref 0.450–4.500)

## 2014-09-14 LAB — T4, FREE: Free T4: 1.47 ng/dL (ref 0.82–1.77)

## 2014-09-15 ENCOUNTER — Telehealth: Payer: Self-pay | Admitting: Internal Medicine

## 2014-09-15 NOTE — Telephone Encounter (Signed)
Pt has appt with Dr. Henrene Pastor later in May. Pt states she is having a problem with abdominal pain on her right side under her rib cage. States the pain goes down her right side and that the percocet she takes for her back pain does not help. Pt requesting sooner appt. Offered pt an APP appt but she needs an appt after 3pm due to child care or an early am appt. Pt states she will keep her appt that is scheduled with Dr.  Henrene Pastor and if she has more problems she will go to the ER if needed.

## 2014-09-19 NOTE — Progress Notes (Signed)
This encounter was created in error - please disregard.

## 2014-10-07 ENCOUNTER — Ambulatory Visit (INDEPENDENT_AMBULATORY_CARE_PROVIDER_SITE_OTHER): Payer: PPO | Admitting: Internal Medicine

## 2014-10-07 ENCOUNTER — Encounter: Payer: Self-pay | Admitting: Internal Medicine

## 2014-10-07 VITALS — BP 120/88 | HR 69 | Temp 98.2°F | Resp 18 | Ht 69.0 in | Wt 249.2 lb

## 2014-10-07 DIAGNOSIS — K219 Gastro-esophageal reflux disease without esophagitis: Secondary | ICD-10-CM | POA: Diagnosis not present

## 2014-10-07 DIAGNOSIS — E049 Nontoxic goiter, unspecified: Secondary | ICD-10-CM

## 2014-10-07 DIAGNOSIS — E034 Atrophy of thyroid (acquired): Secondary | ICD-10-CM

## 2014-10-07 DIAGNOSIS — E038 Other specified hypothyroidism: Secondary | ICD-10-CM | POA: Diagnosis not present

## 2014-10-07 DIAGNOSIS — H6592 Unspecified nonsuppurative otitis media, left ear: Secondary | ICD-10-CM | POA: Diagnosis not present

## 2014-10-07 NOTE — Patient Instructions (Signed)
Follow up in 3 mos for CPE  Continue current medications as ordered  Keep appointments with specialists as scheduled.

## 2014-10-07 NOTE — Progress Notes (Signed)
Patient ID: Andrea [name], female   DOB: 05-Sep-1975, 39 y.o.   MRN: [id number]    Location:    [facility]   Place of Service:   OFFICE    Chief Complaint  Patient presents with  . Medical Management of Chronic Issues    1 month follow-up, Discuss labs ( copy printed )    HPI:  39 yo female seen today for f/u GERD and left ear effusion. She reports reflux sx's improved. She has an appt next week to see Dr Henrene Pastor for further mx. The PPI is working. She feels like she is getting strangulated at time. She is taking singulair for ear effusion.   She is c/a goiter and would like thyroid US. Last one done several yrs ago and she was told she had a nodule. 2010 [place] in EPIC system did not show a nodule. Recent labs showed nml thyroid fxn.   Past Medical History  Diagnosis Date  . Hypertension   . MVP (mitral valve prolapse)     echo per dr Dagmar Hait  . Anxiety   . Depression   . Headache(784.0)   . Protein S deficiency 2003    dvt/pulmonary embolus-on coumadin for 6 months then off-Sees [facility]  . Arthritis     ruptured lumbar disc-careful with positioning  . Hypothyroidism   . Blood dyscrasia     protein s deficiency-no treatment since 2006  . Hyperlipemia   . IBS (irritable bowel syndrome)   . GERD (gastroesophageal reflux disease)   . Hemorrhoids   . Pulmonary embolism   . PVC (premature ventricular contraction)   . High cholesterol     Past Surgical History  Procedure Laterality Date  . Vaginal reconstructive surgery  2001  . Cesarean section  2006  . Tonsillectomy  92  . Laparoscopic assisted vaginal hysterectomy  03/27/2011    Procedure: LAPAROSCOPIC ASSISTED VAGINAL HYSTERECTOMY;  Surgeon: Cyril Mourning, MD;  Location: [facility];  Service: Gynecology;  Laterality: N/A;  . Salpingoophorectomy  03/27/2011    Procedure: SALPINGO OOPHERECTOMY;  Surgeon: Cyril Mourning, MD;  Location: [facility];  Service: Gynecology;  Laterality: Bilateral;    Patient Care  Team: Gildardo Cranker, DO as PCP - General (Internal Medicine) Elsie Stain, MD as Attending Physician (Pulmonary Disease) Irene Shipper, MD as Consulting Physician (Gastroenterology) Chucky May, MD as Consulting Physician (Psychiatry) Adrian Prows, MD as Consulting Physician (Cardiology) Dian Queen, MD as Consulting Physician (Obstetrics and Gynecology) Nicholaus Bloom, MD (Anesthesiology)  History   Social History  . Marital Status: Legally Separated    Spouse Name: N/A  . Number of Children: 2  . Years of Education: N/A   Occupational History  . nutrition [place] History Main Topics  . Smoking status: Former Smoker -- 1.00 packs/day for 15 years    Types: Cigarettes    Quit date: 03/18/2004  . Smokeless tobacco: Never Used  . Alcohol Use: No  . Drug Use: No  . Sexual Activity: Not on file   Other Topics Concern  . Not on file   Social History Narrative   Diet: Regular   Caffeine: Yes   Married- divorced, married in 2006   House: Yes, 2 persons   Pets: 1 dog   Current/Past profession: [employer] 2002-2013   Exercise: Yes, walking   Living Will: No    DNR: No    POA/HPOA: No  reports that she quit smoking about 10 years ago. Her smoking use included Cigarettes. She has a 15 pack-year smoking history. She has never used smokeless tobacco. She reports that she does not drink alcohol or use illicit drugs.  No Known Allergies  Medications: Patient's Medications  New Prescriptions   No medications on file  Previous Medications   ALPRAZOLAM (XANAX) 1 MG TABLET    Take 1 mg by mouth at bedtime as needed.   ASPIRIN EC 81 MG TABLET    Take 81 mg by mouth daily.   BUPROPION (WELLBUTRIN SR) 150 MG 12 HR TABLET    Take 150 mg by mouth daily.   CALCIUM CARBONATE (OS-CAL) 600 MG TABS    Take 600 mg by mouth daily.   CLONAZEPAM (KLONOPIN) 1 MG TABLET    Take 1 mg by mouth 4 (four) times daily.   COENZYME Q10 (COQ10) 100 MG CAPS     Take by mouth daily.   ESOMEPRAZOLE (NEXIUM) 40 MG CAPSULE    Take 1 capsule (40 mg total) by mouth daily.   EZETIMIBE (ZETIA) 10 MG TABLET    Take 10 mg by mouth daily.    FUROSEMIDE (LASIX) 20 MG TABLET    Take 20 mg by mouth daily.   LAMOTRIGINE (LAMICTAL) 150 MG TABLET    Take 150 mg by mouth daily.   LANSOPRAZOLE (PREVACID) 30 MG CAPSULE    Take 30 mg by mouth daily as needed.   LEVOTHYROXINE (SYNTHROID, LEVOTHROID) 150 MCG TABLET    Take 150 mcg by mouth daily before breakfast.   LOSARTAN (COZAAR) 100 MG TABLET    Take 100 mg by mouth daily.   METOPROLOL TARTRATE (LOPRESSOR) 25 MG TABLET    Take 25 mg by mouth 2 (two) times daily.   MONTELUKAST (SINGULAIR) 10 MG TABLET    Take 1 tablet (10 mg total) by mouth at bedtime.   MULTIPLE VITAMINS-MINERALS (MULTIVITAMIN WITH MINERALS) TABLET    Take 1 tablet by mouth daily.    OXYCODONE-ACETAMINOPHEN (PERCOCET/ROXICET) 5-325 MG PER TABLET    Take 1 tablet by mouth every 6 (six) hours as needed for moderate pain or severe pain.   ROSUVASTATIN (CRESTOR) 10 MG TABLET    Take 10 mg by mouth daily.     SERTRALINE (ZOLOFT) 100 MG TABLET    Take 100 mg by mouth daily.   TOPIRAMATE (TOPAMAX) 50 MG TABLET    50 mg. 1-3 by mouth at bedtime   VITAMIN C (ASCORBIC ACID) 500 MG TABLET    Take 500 mg by mouth daily.   VITAMIN D, ERGOCALCIFEROL, (DRISDOL) 50000 UNITS CAPS    Take 50,000 Units by mouth every 7 (seven) days. tuesdays   VITAMIN E 400 UNIT CAPSULE    Take 400 Units by mouth daily.  Modified Medications   No medications on file  Discontinued Medications   No medications on file    Review of Systems  Constitutional: Positive for fatigue. Negative for fever, chills, diaphoresis, activity change and appetite change.  HENT: Positive for trouble swallowing. Negative for ear pain and sore throat.   Eyes: Negative for visual disturbance.  Respiratory: Negative for cough, chest tightness and shortness of breath.   Cardiovascular: Negative for chest  pain, palpitations and leg swelling.  Gastrointestinal: Negative for nausea, vomiting, abdominal pain, diarrhea, constipation and blood in stool.  Genitourinary: Negative for dysuria.  Musculoskeletal: Positive for back pain, arthralgias and gait problem.  Skin: Negative for rash.  Neurological: Negative for dizziness,  tremors, numbness and headaches.  Psychiatric/Behavioral: Negative for sleep disturbance. The patient is nervous/anxious.     Filed Vitals:   10/07/14 0911  BP: 120/88  Pulse: 69  Temp: 98.2 F (36.8 C)  TempSrc: Oral  Resp: 18  Height: _0  (1.753 m)  Weight: 249 lb 3.2 oz (113.036 kg)  SpO2: 99%   Body mass index is 36.78 kg/(m^2).  Physical Exam  Constitutional: She is oriented to person, place, and time. She appears well-developed and well-nourished. No distress.  HENT:  Right Ear: Hearing, tympanic membrane, external ear and ear canal normal.  Left Ear: Hearing, external ear and ear canal normal. Tympanic membrane is not injected, not scarred, not perforated, not erythematous, not retracted and not bulging. A middle ear effusion is present.  Ears:  Mouth/Throat: Oropharynx is clear and moist. No oropharyngeal exudate.  Eyes: Pupils are equal, round, and reactive to light. No scleral icterus.  Neck: Neck supple. No tracheal deviation present. Thyromegaly (L>R but no distinct nodules palpable) present.  Cardiovascular: Normal rate, regular rhythm, normal heart sounds and intact distal pulses.  Exam reveals no gallop and no friction rub.   No murmur heard. No LE edema b/l. no calf TTP. No carotid bruit b/l  Pulmonary/Chest: Effort normal and breath sounds normal. No stridor. No respiratory distress. She has no wheezes. She has no rales.  Abdominal: Soft. Bowel sounds are normal. She exhibits no distension and no mass. There is no tenderness. There is no rebound and no guarding.  Musculoskeletal: She exhibits tenderness.  Lymphadenopathy:    She has no  cervical adenopathy.  Neurological: She is alert and oriented to person, place, and time. She has normal reflexes.  Skin: Skin is warm and dry. No rash noted.  Psychiatric: Her behavior is normal. Her mood appears anxious.     Labs reviewed: Erroneous Encounter on 09/13/2014  Component Date Value Ref Range Status  . Cholesterol, Total 09/13/2014 275* 100 - 199 mg/dL Final  . Triglycerides 09/13/2014 284* 0 - 149 mg/dL Final  . HDL 09/13/2014 45  >39 mg/dL Final   Comment: According to ATP-III Guidelines, HDL-C >59 mg/dL is considered a negative risk factor for CHD.   Marland Kitchen VLDL Cholesterol Cal 09/13/2014 57* 5 - 40 mg/dL Final  . LDL Calculated 09/13/2014 173* 0 - 99 mg/dL Final  . Chol/HDL Ratio 09/13/2014 6.1* 0.0 - 4.4 ratio units Final   Comment:                                   T. Chol/HDL Ratio                                             Men  Women                               1/2 Avg.Risk  3.4    3.3                                   Avg.Risk  5.0    4.4  2X Avg.Risk  9.6    7.1                                3X Avg.Risk 23.4   11.0   . TSH 09/13/2014 1.680  0.450 - 4.500 uIU/mL Final  . Free T4 09/13/2014 1.47  0.82 - 1.77 ng/dL Final  Abstract on 09/07/2014  Component Date Value Ref Range Status  . HM Mammogram 05/20/2010 Normal-GYN   Final  Admission on 08/19/2014, Discharged on 08/19/2014  Component Date Value Ref Range Status  . WBC 08/19/2014 7.1  4.0 - 10.5 K/uL Final  . RBC 08/19/2014 4.70  3.87 - 5.11 MIL/uL Final  . Hemoglobin 08/19/2014 13.8  12.0 - 15.0 g/dL Final  . HCT 08/19/2014 40.8  36.0 - 46.0 % Final  . MCV 08/19/2014 86.8  78.0 - 100.0 fL Final  . MCH 08/19/2014 29.4  26.0 - 34.0 pg Final  . MCHC 08/19/2014 33.8  30.0 - 36.0 g/dL Final  . RDW 08/19/2014 12.5  11.5 - 15.5 % Final  . Platelets 08/19/2014 230  150 - 400 K/uL Final  . Neutrophils Relative % 08/19/2014 53  43 - 77 % Final  . Neutro Abs 08/19/2014 3.7  1.7 -  7.7 K/uL Final  . Lymphocytes Relative 08/19/2014 36  12 - 46 % Final  . Lymphs Abs 08/19/2014 2.6  0.7 - 4.0 K/uL Final  . Monocytes Relative 08/19/2014 7  3 - 12 % Final  . Monocytes Absolute 08/19/2014 0.5  0.1 - 1.0 K/uL Final  . Eosinophils Relative 08/19/2014 4  0 - 5 % Final  . Eosinophils Absolute 08/19/2014 0.3  0.0 - 0.7 K/uL Final  . Basophils Relative 08/19/2014 0  0 - 1 % Final  . Basophils Absolute 08/19/2014 0.0  0.0 - 0.1 K/uL Final  . Sodium 08/19/2014 138  135 - 145 mmol/L Final  . Potassium 08/19/2014 4.2  3.5 - 5.1 mmol/L Final  . Chloride 08/19/2014 103  96 - 112 mmol/L Final  . CO2 08/19/2014 29  19 - 32 mmol/L Final  . Glucose, Bld 08/19/2014 119* 70 - 99 mg/dL Final  . BUN 08/19/2014 12  6 - 23 mg/dL Final  . Creatinine, Ser 08/19/2014 0.84  0.50 - 1.10 mg/dL Final  . Calcium 08/19/2014 9.5  8.4 - 10.5 mg/dL Final  . Total Protein 08/19/2014 7.0  6.0 - 8.3 g/dL Final  . Albumin 08/19/2014 3.9  3.5 - 5.2 g/dL Final  . AST 08/19/2014 29  0 - 37 U/L Final  . ALT 08/19/2014 40* 0 - 35 U/L Final  . Alkaline Phosphatase 08/19/2014 86  39 - 117 U/L Final  . Total Bilirubin 08/19/2014 0.3  0.3 - 1.2 mg/dL Final  . GFR calc non Af Amer 08/19/2014 86* >90 mL/min Final  . GFR calc Af Amer 08/19/2014 >90  >90 mL/min Final   Comment: (NOTE) The eGFR has been calculated using the CKD EPI equation. This calculation has not been validated in all clinical situations. eGFR's persistently <90 mL/min signify possible Chronic Kidney Disease.   . Anion gap 08/19/2014 6  5 - 15 Final  . Lipase 08/19/2014 34  11 - 59 U/L Final  . Troponin I 08/19/2014 <0.03  <0.031 ng/mL Final   Comment:        NO INDICATION OF MYOCARDIAL INJURY.   Marland Kitchen D-Dimer, Quant 08/19/2014 <0.27  0.00 - 0.48 ug/mL-FEU Final  Comment:        AT THE INHOUSE ESTABLISHED CUTOFF VALUE OF 0.48 ug/mL FEU, THIS ASSAY HAS BEEN DOCUMENTED IN THE LITERATURE TO HAVE A SENSITIVITY AND NEGATIVE PREDICTIVE VALUE OF  AT LEAST 98 TO 99%.  THE TEST RESULT SHOULD BE CORRELATED WITH AN ASSESSMENT OF THE CLINICAL PROBABILITY OF DVT / VTE.   Marland Kitchen Color, Urine 08/19/2014 YELLOW  YELLOW Final  . APPearance 08/19/2014 CLEAR  CLEAR Final  . Specific Gravity, Urine 08/19/2014 1.022  1.005 - 1.030 Final  . pH 08/19/2014 5.5  5.0 - 8.0 Final  . Glucose, UA 08/19/2014 NEGATIVE  NEGATIVE mg/dL Final  . Hgb urine dipstick 08/19/2014 NEGATIVE  NEGATIVE Final  . Bilirubin Urine 08/19/2014 NEGATIVE  NEGATIVE Final  . Ketones, ur 08/19/2014 NEGATIVE  NEGATIVE mg/dL Final  . Protein, ur 08/19/2014 NEGATIVE  NEGATIVE mg/dL Final  . Urobilinogen, UA 08/19/2014 0.2  0.0 - 1.0 mg/dL Final  . Nitrite 08/19/2014 NEGATIVE  NEGATIVE Final  . Leukocytes, UA 08/19/2014 SMALL* NEGATIVE Final  . Squamous Epithelial / LPF 08/19/2014 FEW* RARE Final  . WBC, UA 08/19/2014 3-6  <3 WBC/hpf Final  . RBC / HPF 08/19/2014 0-2  <3 RBC/hpf Final  . Bacteria, UA 08/19/2014 MANY* RARE Final  . Troponin I 08/19/2014 <0.03  <0.031 ng/mL Final   Comment:        NO INDICATION OF MYOCARDIAL INJURY.     No results found.   Assessment/Plan   ICD-9-CM ICD-10-CM   1. Goiter 240.9 E04.9 US Soft Tissue Head/Neck  2. Middle ear effusion, left - stable; cont singulair 381.4 H65.92   3. Hypothyroidism due to acquired atrophy of thyroid - controlled; cont levothyroxine 244.8 E03.8 US Soft Tissue Head/Neck   246.8 E03.4   4. Gastroesophageal reflux disease, esophagitis presence not specified - improved; cont PPI 530.81 K21.9     --f/u with Dr Henrene Pastor, Psych and pain mx as scheduled  --continue current meds as rx  --RTO in 3 mos for CPE  [facility] S. Perlie Gold  [facility] [address] [place], [place] [address] [phone] Cell (Monday-Friday 8 AM - 5 PM) [phone] After 5 PM and follow prompts

## 2014-10-10 ENCOUNTER — Ambulatory Visit (INDEPENDENT_AMBULATORY_CARE_PROVIDER_SITE_OTHER): Payer: PPO | Admitting: Internal Medicine

## 2014-10-10 ENCOUNTER — Encounter: Payer: Self-pay | Admitting: Internal Medicine

## 2014-10-10 VITALS — BP 126/84 | HR 74 | Ht 69.0 in | Wt 250.0 lb

## 2014-10-10 DIAGNOSIS — K625 Hemorrhage of anus and rectum: Secondary | ICD-10-CM | POA: Diagnosis not present

## 2014-10-10 DIAGNOSIS — K589 Irritable bowel syndrome without diarrhea: Secondary | ICD-10-CM

## 2014-10-10 DIAGNOSIS — K219 Gastro-esophageal reflux disease without esophagitis: Secondary | ICD-10-CM | POA: Diagnosis not present

## 2014-10-10 DIAGNOSIS — R1011 Right upper quadrant pain: Secondary | ICD-10-CM

## 2014-10-10 MED ORDER — MOVIPREP 100 G PO SOLR
1.0000 | Freq: Once | ORAL | Status: DC
Start: 2014-10-10 — End: 2014-10-28

## 2014-10-10 NOTE — Progress Notes (Signed)
HISTORY OF PRESENT ILLNESS:  Andrea [name] is a 39 y.o. female with hypertension, anxiety/depression, hypothyroidism, hyperlipidemia, morbid obesity, GERD, and IBS. Also has a history of anal fissure. She presents today with a myriad of GI complaints. Last evaluated May 2014. See that dictation for details. First, she reports 2 month history of right upper quadrant pain described as dull discomfort. Exacerbated by direct palpation. Not affected by meals. Next, recently developed problems with significant pyrosis with regurgitation and chest pain. Currently taking Prevacid 30 mg daily with some improvement. She does notice gurgling in the throat. No dysphagia. Next, sensation of fullness. She notices that her closer tight. She is gaining weight. Bowel habits are irregular. She has noticed worsening rectal bleeding over the past year. Generally bright red. She is concerned about her gallbladder. She inquires about GI procedures to evaluate her symptoms. She did undergo complete colonoscopy with intubation of the ileum may 2008. This was normal. She also underwent upper endoscopy at that same time. This was normal. She was diagnosed with GERD and IBS. Abdominal ultrasound that time was negative. No gallstones. Review of blood work from April 2016 shows unremarkable comprehensive metabolic panel except for elevated ALT. Normal CBC with differential. Markedly abnormal lipids. Other GI complaints include belching, bloating, nausea, loss of appetite, weight gain, anal discomfort, incontinence,  REVIEW OF SYSTEMS:  All non-GI ROS negative except for anxiety, back pain, visual change, confusion, breast change, depression, fatigue, shortness of breath, night sweats, muscle cramps, headaches, sleeping problems, sore throat, increased urination, urinary frequency, urinary leakage, hoarseness,  Past Medical History  Diagnosis Date  . Hypertension   . MVP (mitral valve prolapse)     echo per dr Dagmar Hait  . Anxiety    . Depression   . Headache(784.0)   . Protein S deficiency 2003    dvt/pulmonary embolus-on coumadin for 6 months then off-Sees [facility]  . Arthritis     ruptured lumbar disc-careful with positioning  . Hypothyroidism   . Blood dyscrasia     protein s deficiency-no treatment since 2006  . Hyperlipemia   . IBS (irritable bowel syndrome)   . GERD (gastroesophageal reflux disease)   . Hemorrhoids   . Pulmonary embolism   . PVC (premature ventricular contraction)   . High cholesterol     Past Surgical History  Procedure Laterality Date  . Vaginal reconstructive surgery  2001  . Cesarean section  2006  . Tonsillectomy  92  . Laparoscopic assisted vaginal hysterectomy  03/27/2011    Procedure: LAPAROSCOPIC ASSISTED VAGINAL HYSTERECTOMY;  Surgeon: Cyril Mourning, MD;  Location: [facility];  Service: Gynecology;  Laterality: N/A;  . Salpingoophorectomy  03/27/2011    Procedure: SALPINGO OOPHERECTOMY;  Surgeon: Cyril Mourning, MD;  Location: [facility];  Service: Gynecology;  Laterality: Bilateral;    Social History AVARI NEVARES  reports that she quit smoking about 10 years ago. Her smoking use included Cigarettes. She has a 15 pack-year smoking history. She has never used smokeless tobacco. She reports that she does not drink alcohol or use illicit drugs.  family history includes Asthma in her son and son; Clotting disorder in her maternal uncle and paternal grandmother; Heart disease in her maternal grandfather; High blood pressure in her father and mother; Hyperlipidemia in her father; Hypertension in her other.  No Known Allergies     PHYSICAL EXAMINATION: Vital signs: BP 126/84 mmHg  Pulse 74  Ht _0  (1.753 m)  Wt 250 lb (113.399 kg)  BMI 36.90 kg/m2  LMP 03/19/2011  Constitutional: Obese, generally well-appearing, no acute distress Psychiatric: alert and oriented x3, cooperative Eyes: extraocular movements intact, anicteric, conjunctiva pink Mouth: oral  pharynx moist, no lesions Neck: supple no lymphadenopathy Cardiovascular: heart regular rate and rhythm, no murmur Lungs: clear to auscultation bilaterally Abdomen: soft, obese, tender in the right upper quadrant palpation, nondistended, no obvious ascites, no peritoneal signs, normal bowel sounds, no organomegaly Rectal: Deferred until colonoscopy Extremities: no lower extremity edema bilaterally Skin: no lesions on visible extremities Neuro: No focal deficits. No asterixis.    ASSESSMENT:  #1. Right upper quadrant discomfort. Musculoskeletal #2. GERD. Somewhat improved with PPI #3. IBS. Ongoing #4. Morbid obesity #5. Mild elevation of ALT likely secondary to fatty liver #6. Rectal bleeding. Likely benign anorectal pathology. Rule out neoplasia #7. Multiple non-GI somatic complaints   PLAN:  #1. Abdominal ultrasound to evaluate right upper quadrant pain and elevated ALT #2. Upper endoscopy to evaluate recent development of reflux symptoms somewhat persistent despite PPI. As well evaluate upper abdominal pain.The nature of the procedure, as well as the risks, benefits, and alternatives were carefully and thoroughly reviewed with the patient. Ample time for discussion and questions allowed. The patient understood, was satisfied, and agreed to proceed. #3. Colonoscopy. Principally to evaluate worsening rectal bleeding.The nature of the procedure, as well as the risks, benefits, and alternatives were carefully and thoroughly reviewed with the patient. Ample time for discussion and questions allowed. The patient understood, was satisfied, and agreed to proceed. #4. Recommend fiber and could consider Librax for IBS symptoms

## 2014-10-10 NOTE — Patient Instructions (Addendum)
You have been scheduled for an abdominal ultrasound at [facility] (1st floor of hospital) on 10/11/14 at 12 noon. Please arrive 15 minutes prior to your appointment for registration. Make certain not to have anything to eat or drink 6 hours prior to your appointment. Should you need to reschedule your appointment, please contact radiology at [phone]. This test typically takes about 30 minutes to perform.  You have been scheduled for an endoscopy and colonoscopy. Please follow the written instructions given to you at your visit today. Please pick up your prep supplies at the pharmacy within the next 1-3 days.  If you use inhalers (even only as needed), please bring them with you on the day of your procedure. Your physician has requested that you go to www.startemmi.com and enter the access code given to you at your visit today. This web site gives a general overview about your procedure. However, you should still follow specific instructions given to you by our office regarding your preparation for the procedure.  CC:  Gildardo Cranker MD

## 2014-10-11 ENCOUNTER — Telehealth: Payer: Self-pay

## 2014-10-11 ENCOUNTER — Ambulatory Visit (HOSPITAL_COMMUNITY)
Admission: RE | Admit: 2014-10-11 | Discharge: 2014-10-11 | Disposition: A | Discharge status: Home / Self Care | Payer: PPO | Source: Ambulatory Visit | Attending: Internal Medicine | Admitting: Internal Medicine

## 2014-10-11 DIAGNOSIS — K219 Gastro-esophageal reflux disease without esophagitis: Secondary | ICD-10-CM

## 2014-10-11 DIAGNOSIS — R932 Abnormal findings on diagnostic imaging of liver and biliary tract: Secondary | ICD-10-CM | POA: Insufficient documentation

## 2014-10-11 DIAGNOSIS — R1011 Right upper quadrant pain: Secondary | ICD-10-CM | POA: Diagnosis present

## 2014-10-11 DIAGNOSIS — K760 Fatty (change of) liver, not elsewhere classified: Secondary | ICD-10-CM | POA: Insufficient documentation

## 2014-10-11 DIAGNOSIS — K625 Hemorrhage of anus and rectum: Secondary | ICD-10-CM

## 2014-10-11 NOTE — Telephone Encounter (Signed)
Is her husband a doctor? I can discuss with them at the time of her procedures.Marland KitchenMarland KitchenMarland Kitchen

## 2014-10-11 NOTE — Telephone Encounter (Signed)
Pt and her husband are very concerned about the polyp. Pt wants to know if there is anything else that needs to be done. Pts husband told her that polyps can be cancerous and wonders if she needs to have a CT scan done. Pt states her husband wants her to have a second opinion but she doesn't want to have this done because she has seen Dr. Henrene Pastor for so long. Please advise.

## 2014-10-12 ENCOUNTER — Other Ambulatory Visit: Payer: Self-pay

## 2014-10-12 MED ORDER — ONDANSETRON HCL 4 MG PO TABS: 4.0000 mg | ORAL_TABLET | Freq: Three times a day (TID) | ORAL | Status: DC | PRN

## 2014-10-12 NOTE — Telephone Encounter (Signed)
Spoke with pt and she is aware, states she has been vomiting ever since she got off the phone earlier. Pt wanted to know if her ECL could be done sooner. Checked schedule and there are no sooner appts unless there is a cancellation. Pt then asked if we could go ahead and set her up for a surgical appt for their opinion regarding her gallbladder. Pt would also like something for nausea. Please advise.

## 2014-10-12 NOTE — Telephone Encounter (Signed)
1. Zofran for nausea 2. Surgical referral "small gallbladder polyp, right upper quadrant pain, patient worried"

## 2014-10-12 NOTE — Telephone Encounter (Signed)
Tell her to calm down. Furthermore, not helpful or appropriate to be threatening with legal recourse. Let's complete the endoscopic workup, as planned, to make sure there aren't other problems to explain her symptoms. The Gallbladder polyp is TINY and unlikely to explain her severe pain. However, If the endoscopic workup returns okay, I'm happy to send her to a surgeon about getting her gallbladder removed. Thanks

## 2014-10-12 NOTE — Telephone Encounter (Signed)
Pt called back this morning and states she spoke with her family last night and found out that her fathers family has a history of gallbladder problems. States she has an aunt that almost died before they found out the gallbladder was the problem. Pt states she is so tired of hurting and not being able to eat. Pt thinks tests that could be done on her are not being ordered due to the type of insurance she has, discussed with pt that it does not matter what type of insurance she has. 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 Pt states the symptoms she is having are not like she has had in the past. States she cannot eat, is having pain and swelling in her belly. Pt very tearful and states she just cannot take the pain anymore.

## 2014-10-12 NOTE — Telephone Encounter (Signed)
Pt aware and script sent to pharmacy. Call placed to [facility] for referral, left message for pt to call back.

## 2014-10-13 ENCOUNTER — Ambulatory Visit (HOSPITAL_COMMUNITY): Payer: PPO

## 2014-10-13 NOTE — Telephone Encounter (Signed)
Pt scheduled to see Dr. Grandville Silos with [facility] 10/26/14@11am , pt to arrive there at 10:40am. Left message for pt to call back.

## 2014-10-13 NOTE — Telephone Encounter (Signed)
Spoke with pt and she is aware of appt. 

## 2014-10-14 ENCOUNTER — Ambulatory Visit
Admission: RE | Admit: 2014-10-14 | Discharge: 2014-10-14 | Disposition: A | Discharge status: Home / Self Care | Payer: PPO | Source: Ambulatory Visit | Attending: Internal Medicine | Admitting: Internal Medicine

## 2014-10-14 DIAGNOSIS — E034 Atrophy of thyroid (acquired): Secondary | ICD-10-CM

## 2014-10-14 DIAGNOSIS — E049 Nontoxic goiter, unspecified: Secondary | ICD-10-CM

## 2014-10-26 ENCOUNTER — Ambulatory Visit: Payer: Self-pay | Admitting: General Surgery

## 2014-10-28 NOTE — Pre-Procedure Instructions (Signed)
    Andrea [name]  10/28/2014      [facility] - [place], [place] - [address] [place] [address] Phone: [phone] Fax: [phone]    Your procedure is scheduled on 11/03/14.  Report to [facility] Admitting at 530 A.M.  Call this number if you have problems the morning of surgery:  [phone]   Remember:  Do not eat food or drink liquids after midnight.  Take these medicines the morning of surgery with A SIP OF WATER xanax,wellbutrin,nexium,synthroid, Lopressor,oxycodone,zoloft  Do not wear jewelry, make-up or nail polish.  Do not wear lotions, powders, or perfumes.  You may wear deodorant.  Do not shave 48 hours prior to surgery.  Men may shave face and neck.  Do not bring valuables to the hospital.  [facility] is not responsible for any belongings or valuables.  Contacts, dentures or bridgework may not be worn into surgery.  Leave your suitcase in the car.  After surgery it may be brought to your room.  For patients admitted to the hospital, discharge time will be determined by your treatment team.  Patients discharged the day of surgery will not be allowed to drive home.   Name and phone number of your driver:   * Please read over the following fact sheets that you were given. Pain Booklet, Coughing and Deep Breathing and Surgical Site Infection Prevention

## 2014-10-31 ENCOUNTER — Encounter (HOSPITAL_COMMUNITY): Payer: Self-pay

## 2014-10-31 ENCOUNTER — Encounter (HOSPITAL_COMMUNITY)
Admission: RE | Admit: 2014-10-31 | Discharge: 2014-10-31 | Disposition: A | Discharge status: Home / Self Care | Payer: PPO | Source: Ambulatory Visit | Attending: General Surgery | Admitting: General Surgery

## 2014-10-31 DIAGNOSIS — M1990 Unspecified osteoarthritis, unspecified site: Secondary | ICD-10-CM | POA: Diagnosis not present

## 2014-10-31 DIAGNOSIS — E039 Hypothyroidism, unspecified: Secondary | ICD-10-CM | POA: Diagnosis not present

## 2014-10-31 DIAGNOSIS — Z87891 Personal history of nicotine dependence: Secondary | ICD-10-CM | POA: Diagnosis not present

## 2014-10-31 DIAGNOSIS — I10 Essential (primary) hypertension: Secondary | ICD-10-CM | POA: Diagnosis not present

## 2014-10-31 DIAGNOSIS — K811 Chronic cholecystitis: Secondary | ICD-10-CM | POA: Diagnosis not present

## 2014-10-31 DIAGNOSIS — K824 Cholesterolosis of gallbladder: Secondary | ICD-10-CM | POA: Diagnosis not present

## 2014-10-31 LAB — CBC
HCT: 40.6 % (ref 36.0–46.0)
Hemoglobin: 14 g/dL (ref 12.0–15.0)
MCH: 29.4 pg (ref 26.0–34.0)
MCHC: 34.5 g/dL (ref 30.0–36.0)
MCV: 85.1 fL (ref 78.0–100.0)
Platelets: 230 10*3/uL (ref 150–400)
RBC: 4.77 MIL/uL (ref 3.87–5.11)
RDW: 12.6 % (ref 11.5–15.5)
WBC: 7.8 10*3/uL (ref 4.0–10.5)

## 2014-10-31 LAB — BASIC METABOLIC PANEL
Anion gap: 6 (ref 5–15)
BUN: 9 mg/dL (ref 6–20)
CO2: 27 mmol/L (ref 22–32)
Calcium: 9.6 mg/dL (ref 8.9–10.3)
Chloride: 103 mmol/L (ref 101–111)
Creatinine, Ser: 0.87 mg/dL (ref 0.44–1.00)
GFR calc Af Amer: >60 mL/min (ref >60)
GFR calc non Af Amer: >60 mL/min (ref >60)
Glucose, Bld: 115 mg/dL — ABNORMAL HIGH (ref 65–99)
Potassium: 4.6 mmol/L (ref 3.5–5.1)
Sodium: 136 mmol/L (ref 135–145)

## 2014-11-01 ENCOUNTER — Other Ambulatory Visit: Payer: Self-pay | Admitting: Internal Medicine

## 2014-11-02 MED ORDER — CEFAZOLIN SODIUM-DEXTROSE 2-3 GM-% IV SOLR
2.0000 g | INTRAVENOUS | Status: AC
  Administered 2014-11-03: 2 g via INTRAVENOUS
  Filled 2014-11-02: qty 50

## 2014-11-02 NOTE — H&P (Signed)
History of Present Illness Andrea [name] Andrea [name]; 10/26/2014 11:14 AM) Patient words: gallbladder.  The patient is a 39 year old female who presents with abdominal pain. Andrea [name] has been expressing right upper quadrant abdominal pain for several weeks. It is associated with bloating and belching. She is also having diarrhea. Eating fatty foods makes the symptoms much worse. She was evaluated with abdominal ultrasound and this revealed a gallbladder polyp, 0.7 cm in size. No gallstones were seen. I was asked to see her in consultation by Dr. Henrene Pastor her gastroenterologist for consideration of cholecystectomy. She is taking Zofran and Imodium daily to try and control the symptoms.   Other Problems Andrea Lorenzo, [name]; 01/18/4781 95:62 AM) Anxiety Disorder Back Pain Bladder Problems Chest pain Depression Gastroesophageal Reflux Disease Hemorrhoids High blood pressure Hypercholesterolemia Migraine Headache Oophorectomy Bilateral. Other disease, cancer, significant illness Pulmonary Embolism / Blood Clot in Legs Thyroid Disease  Past Surgical History Andrea Lorenzo, [name]; 05/23/863 78:46 AM) Cesarean Section - 1 Hysterectomy (not due to cancer) - Complete Tonsillectomy  Diagnostic Studies History Andrea Lorenzo, [name]; 01/24/2951 84:13 AM) Colonoscopy 5-10 years ago Mammogram 1-3 years ago Pap Smear 1-5 years ago  Allergies Andrea Lorenzo, [name]; 06/23/4008 27:25 AM) No Known Allergies06/12/2014  Medication History Andrea Lorenzo, [name]; 07/24/6438 34:74 AM) Oxycodone-Acetaminophen (5-325MG  Tablet, Oral) Active. ALPRAZolam (1MG  Tablet, Oral) Active. ClonazePAM (1MG  Tablet, Oral) Active. BuPROPion HCl ER (SR) (150MG  Tablet ER 12HR, Oral) Active. Esomeprazole Magnesium (40MG  Capsule DR, Oral) Active. Furosemide (20MG  Tablet, Oral) Active. LamoTRIgine (150MG  Tablet, Oral) Active. Metoprolol Tartrate (25MG  Tablet, Oral) Active. Montelukast Sodium (10MG  Tablet,  Oral) Active. Losartan Potassium (100MG  Tablet, Oral) Active. Synthroid (125MCG Tablet, Oral) Active. Ondansetron HCl (4MG /5ML Solution, Oral) Active. Topiramate (50MG  Tablet, Oral) Active. Sertraline HCl (100MG  Tablet, Oral) Active. Vitamin D (Ergocalciferol) (50000UNIT Capsule, Oral) Active. Vitamin C ER (500MG  Capsule ER, Oral) Active. Vitamin E Natural (400UNIT Capsule, Oral) Active. Aspirin EC (81MG  Tablet DR, Oral) Active. Calcium Carbonate (600MG  Tablet, Oral) Active. Co Q 10 (100MG  Capsule, Oral) Active. Zetia (10MG  Tablet, Oral) Active. Multivitamin/Multimineral (Oral) Active. Crestor (10MG  Tablet, Oral) Active.  Social History Andrea Lorenzo, [name]; 06/24/9561 87:56 AM) Alcohol use Occasional alcohol use. Caffeine use Coffee, Tea. No drug use Tobacco use Former smoker.  Family History Andrea Lorenzo, [name]; 08/20/3293 18:84 AM) Cerebrovascular Accident Family Members In General. Colon Polyps Father. Depression Mother. Heart Disease Family Members In General. Heart disease in female family member before age 30 Hypertension Family Members In General, Father, Mother, Son. Migraine Headache Mother.  Pregnancy / Birth History Andrea Lorenzo, [name]; 05/[birth year]/6061 01:60 AM) Age at menarche 63 years. Age of menopause <45 Gravida 3 Maternal age 17-25 Para 2  Review of Systems Andrea Lorenzo [name]; 1/0/9323 55:73 AM) General Present- Appetite Loss, Fatigue, Night Sweats and Weight Gain. Not Present- Chills, Fever and Weight Loss. Skin Present- Dryness. Not Present- Change in Wart/Mole, Hives, Jaundice, New Lesions, Non-Healing Wounds, Rash and Ulcer. HEENT Present- Earache, Seasonal Allergies, Visual Disturbances and Wears glasses/contact lenses. Not Present- Hearing Loss, Hoarseness, Nose Bleed, Oral Ulcers, Ringing in the Ears, Sinus Pain, Sore Throat and Yellow Eyes. Breast Present- Breast Pain. Not Present- Breast Mass, Nipple Discharge and Skin  Changes. Cardiovascular Present- Chest Pain, Leg Cramps, Palpitations, Rapid Heart Rate, Shortness of Breath and Swelling of Extremities. Not Present- Difficulty Breathing Lying Down. Gastrointestinal Present- Abdominal Pain, Bloating, Change in Bowel Habits, Chronic diarrhea, Difficulty Swallowing, Excessive gas, Gets full quickly at meals, Hemorrhoids, Indigestion, Nausea, Rectal Pain and Vomiting. Not Present- Bloody Stool and  Constipation. Female Genitourinary Present- Frequency, Nocturia, Painful Urination, Pelvic Pain and Urgency. Neurological Present- Decreased Memory and Headaches. Not Present- Fainting, Numbness, Seizures, Tingling, Tremor, Trouble walking and Weakness. Psychiatric Present- Anxiety, Change in Sleep Pattern, Depression and Fearful. Not Present- Bipolar and Frequent crying. Endocrine Present- Heat Intolerance and Hot flashes. Not Present- Cold Intolerance, Excessive Hunger, Hair Changes and New Diabetes. Hematology Present- Easy Bruising. Not Present- Excessive bleeding, Gland problems, HIV and Persistent Infections.   Vitals Andrea Billings Dockery [name]; 01/26/8337 25:05 AM) 10/26/2014 11:00 AM Weight: 254.6 lb Height: 69in Body Surface Area: 2.37 m Body Mass Index: 37.6 kg/m Temp.: 99.31F(Oral)  Pulse: 74 (Regular)  BP: 138/86 (Sitting, Left Arm, Standard)    Physical Exam Andrea [name] Andrea [name]; 10/26/2014 11:15 AM) General Mental Status-Alert. General Appearance-Consistent with stated age. Hydration-Well hydrated. Voice-Normal.  Head and Neck Head-normocephalic, atraumatic with no lesions or palpable masses.  Eye Eyeball - Bilateral-Extraocular movements intact. Sclera/Conjunctiva - Bilateral-No scleral icterus.  Chest and Lung Exam Chest and lung exam reveals -quiet, even and easy respiratory effort with no use of accessory muscles and on auscultation, normal breath sounds, no adventitious sounds and normal vocal  resonance. Inspection Chest Wall - Normal. Back - normal.  Cardiovascular Cardiovascular examination reveals -on palpation PMI is normal in location and amplitude, no palpable S3 or S4. Normal cardiac borders., normal heart sounds, regular rate and rhythm with no murmurs, carotid auscultation reveals no bruits and normal pedal pulses bilaterally.  Abdomen Inspection Inspection of the abdomen reveals - No Hernias. Skin - Scar - Periumbilical. Palpation/Percussion Palpation and Percussion of the abdomen reveal - Soft, No Rigidity (guarding)(Mild tenderness to deep palpation in the right upper quadrant, no masses) and No hepatosplenomegaly. Auscultation Auscultation of the abdomen reveals - Bowel sounds normal.  Neurologic Neurologic evaluation reveals -alert and oriented x 3 with no impairment of recent or remote memory. Mental Status-Normal.  Musculoskeletal Normal Exam - Left-Upper Extremity Strength Normal and Lower Extremity Strength Normal. Normal Exam - Right-Upper Extremity Strength Normal, Lower Extremity Weakness.    Assessment & Plan Andrea [name] Andrea [name]; 10/26/2014 11:17 AM) GALLBLADDER POLYP (575.6  K82.4) Impression: She also is having significant biliary symptoms. I have offered laparoscopic cholecystectomy with intraoperative cholangiogram. Procedure, risks, and benefits were discussed in detail with her. We also discussed the expected postoperative course. I know her husband from previous cholecystectomy in the past. I will forward to scheduling this soon. Current Plans  Pt Education - Pamphlet Given - Laparoscopic Gallbladder Surgery: discussed with patient and provided information.  Georganna Skeans, [name], MPH, FACS Trauma: [phone] General Surgery: [phone]

## 2014-11-03 ENCOUNTER — Encounter (HOSPITAL_COMMUNITY)
Admission: RE | Disposition: A | Discharge status: Home / Self Care | Payer: Self-pay | Source: Ambulatory Visit | Attending: General Surgery

## 2014-11-03 ENCOUNTER — Ambulatory Visit (HOSPITAL_COMMUNITY): Payer: PPO | Admitting: Anesthesiology

## 2014-11-03 ENCOUNTER — Encounter (HOSPITAL_COMMUNITY): Payer: Self-pay | Admitting: Surgery

## 2014-11-03 ENCOUNTER — Ambulatory Visit (HOSPITAL_COMMUNITY): Payer: PPO

## 2014-11-03 ENCOUNTER — Ambulatory Visit (HOSPITAL_COMMUNITY)
Admission: RE | Admit: 2014-11-03 | Discharge: 2014-11-03 | Disposition: A | Discharge status: Home / Self Care | Payer: PPO | Source: Ambulatory Visit | Attending: General Surgery | Admitting: General Surgery

## 2014-11-03 DIAGNOSIS — I10 Essential (primary) hypertension: Secondary | ICD-10-CM | POA: Insufficient documentation

## 2014-11-03 DIAGNOSIS — M1990 Unspecified osteoarthritis, unspecified site: Secondary | ICD-10-CM | POA: Insufficient documentation

## 2014-11-03 DIAGNOSIS — K811 Chronic cholecystitis: Secondary | ICD-10-CM | POA: Insufficient documentation

## 2014-11-03 DIAGNOSIS — K824 Cholesterolosis of gallbladder: Secondary | ICD-10-CM

## 2014-11-03 DIAGNOSIS — Z87891 Personal history of nicotine dependence: Secondary | ICD-10-CM | POA: Insufficient documentation

## 2014-11-03 DIAGNOSIS — E039 Hypothyroidism, unspecified: Secondary | ICD-10-CM | POA: Insufficient documentation

## 2014-11-03 HISTORY — PX: CHOLECYSTECTOMY: SHX55

## 2014-11-03 SURGERY — LAPAROSCOPIC CHOLECYSTECTOMY WITH INTRAOPERATIVE CHOLANGIOGRAM
Anesthesia: General | Site: Abdomen

## 2014-11-03 MED ORDER — DEXAMETHASONE SODIUM PHOSPHATE 4 MG/ML IJ SOLN
INTRAMUSCULAR | Status: AC
  Filled 2014-11-03: qty 2

## 2014-11-03 MED ORDER — 0.9 % SODIUM CHLORIDE (POUR BTL) OPTIME
TOPICAL | Status: DC | PRN
  Administered 2014-11-03: 1000 mL

## 2014-11-03 MED ORDER — ARTIFICIAL TEARS OP OINT
TOPICAL_OINTMENT | OPHTHALMIC | Status: AC
  Filled 2014-11-03: qty 3.5

## 2014-11-03 MED ORDER — GLYCOPYRROLATE 0.2 MG/ML IJ SOLN
INTRAMUSCULAR | Status: AC
  Filled 2014-11-03: qty 3

## 2014-11-03 MED ORDER — DEXAMETHASONE SODIUM PHOSPHATE 4 MG/ML IJ SOLN
INTRAMUSCULAR | Status: DC | PRN
  Administered 2014-11-03: 8 mg via INTRAVENOUS

## 2014-11-03 MED ORDER — GLYCOPYRROLATE 0.2 MG/ML IJ SOLN
INTRAMUSCULAR | Status: DC | PRN
  Administered 2014-11-03: 0.4 mg via INTRAVENOUS
  Administered 2014-11-03: 0.6 mg via INTRAVENOUS

## 2014-11-03 MED ORDER — FENTANYL CITRATE (PF) 100 MCG/2ML IJ SOLN
INTRAMUSCULAR | Status: DC | PRN
  Administered 2014-11-03: 200 ug via INTRAVENOUS
  Administered 2014-11-03 (×2): 50 ug via INTRAVENOUS

## 2014-11-03 MED ORDER — NEOSTIGMINE METHYLSULFATE 10 MG/10ML IV SOLN
INTRAVENOUS | Status: AC
  Filled 2014-11-03: qty 1

## 2014-11-03 MED ORDER — BUPIVACAINE-EPINEPHRINE 0.25% -1:200000 IJ SOLN
INTRAMUSCULAR | Status: DC | PRN
  Administered 2014-11-03: 21 mL

## 2014-11-03 MED ORDER — LIDOCAINE HCL (CARDIAC) 20 MG/ML IV SOLN
INTRAVENOUS | Status: DC | PRN
  Administered 2014-11-03: 100 mg via INTRAVENOUS

## 2014-11-03 MED ORDER — ROCURONIUM BROMIDE 50 MG/5ML IV SOLN
INTRAVENOUS | Status: AC
  Filled 2014-11-03: qty 1

## 2014-11-03 MED ORDER — ONDANSETRON HCL 4 MG/2ML IJ SOLN
INTRAMUSCULAR | Status: AC
  Filled 2014-11-03: qty 2

## 2014-11-03 MED ORDER — ONDANSETRON HCL 4 MG/2ML IJ SOLN
4.0000 mg | Freq: Once | INTRAMUSCULAR | Status: AC
  Administered 2014-11-03: 4 mg via INTRAVENOUS

## 2014-11-03 MED ORDER — GLYCOPYRROLATE 0.2 MG/ML IJ SOLN
INTRAMUSCULAR | Status: AC
  Filled 2014-11-03: qty 2

## 2014-11-03 MED ORDER — SUCCINYLCHOLINE CHLORIDE 20 MG/ML IJ SOLN
INTRAMUSCULAR | Status: AC
  Filled 2014-11-03: qty 1

## 2014-11-03 MED ORDER — PROPOFOL 10 MG/ML IV BOLUS
INTRAVENOUS | Status: DC | PRN
  Administered 2014-11-03: 180 mg via INTRAVENOUS

## 2014-11-03 MED ORDER — BUPIVACAINE-EPINEPHRINE (PF) 0.25% -1:200000 IJ SOLN
INTRAMUSCULAR | Status: AC
  Filled 2014-11-03: qty 30

## 2014-11-03 MED ORDER — FENTANYL CITRATE (PF) 250 MCG/5ML IJ SOLN
INTRAMUSCULAR | Status: AC
  Filled 2014-11-03: qty 5

## 2014-11-03 MED ORDER — ALPRAZOLAM 0.25 MG PO TABS
1.0000 mg | ORAL_TABLET | Freq: Once | ORAL | Status: AC
  Administered 2014-11-03: 1 mg via ORAL

## 2014-11-03 MED ORDER — ROCURONIUM BROMIDE 100 MG/10ML IV SOLN
INTRAVENOUS | Status: DC | PRN
Start: 2014-11-03 — End: 2014-11-03
  Administered 2014-11-03: 50 mg via INTRAVENOUS

## 2014-11-03 MED ORDER — OXYCODONE-ACETAMINOPHEN 5-325 MG PO TABS: 1.0000 | ORAL_TABLET | ORAL | Status: DC | PRN

## 2014-11-03 MED ORDER — NEOSTIGMINE METHYLSULFATE 10 MG/10ML IV SOLN
INTRAVENOUS | Status: DC | PRN
  Administered 2014-11-03: 5 mg via INTRAVENOUS

## 2014-11-03 MED ORDER — MIDAZOLAM HCL 5 MG/5ML IJ SOLN
INTRAMUSCULAR | Status: DC | PRN
  Administered 2014-11-03: 2 mg via INTRAVENOUS

## 2014-11-03 MED ORDER — FENTANYL CITRATE (PF) 100 MCG/2ML IJ SOLN
25.0000 ug | INTRAMUSCULAR | Status: DC | PRN
  Administered 2014-11-03 (×2): 50 ug via INTRAVENOUS

## 2014-11-03 MED ORDER — LIDOCAINE HCL (CARDIAC) 20 MG/ML IV SOLN
INTRAVENOUS | Status: AC
  Filled 2014-11-03: qty 5

## 2014-11-03 MED ORDER — MIDAZOLAM HCL 2 MG/2ML IJ SOLN
INTRAMUSCULAR | Status: AC
  Filled 2014-11-03: qty 2

## 2014-11-03 MED ORDER — ACETAMINOPHEN 160 MG/5ML PO SOLN
325.0000 mg | ORAL | Status: DC | PRN
  Filled 2014-11-03: qty 20.3

## 2014-11-03 MED ORDER — CHLORHEXIDINE GLUCONATE 4 % EX LIQD: 1.0000 "application " | Freq: Once | CUTANEOUS | Status: DC

## 2014-11-03 MED ORDER — SODIUM CHLORIDE 0.9 % IV SOLN
INTRAVENOUS | Status: DC | PRN
  Administered 2014-11-03: 9 mL

## 2014-11-03 MED ORDER — SODIUM CHLORIDE 0.9 % IR SOLN
Status: DC | PRN
  Administered 2014-11-03: 1000 mL

## 2014-11-03 MED ORDER — ALPRAZOLAM 0.25 MG PO TABS
ORAL_TABLET | ORAL | Status: AC
  Filled 2014-11-03: qty 4

## 2014-11-03 MED ORDER — ONDANSETRON HCL 4 MG/2ML IJ SOLN
INTRAMUSCULAR | Status: DC | PRN
  Administered 2014-11-03 (×2): 4 mg via INTRAVENOUS

## 2014-11-03 MED ORDER — LACTATED RINGERS IV SOLN
INTRAVENOUS | Status: DC | PRN
  Administered 2014-11-03 (×2): via INTRAVENOUS

## 2014-11-03 MED ORDER — OXYCODONE HCL 5 MG/5ML PO SOLN: 5.0000 mg | Freq: Once | ORAL | Status: DC | PRN

## 2014-11-03 MED ORDER — FENTANYL CITRATE (PF) 100 MCG/2ML IJ SOLN
INTRAMUSCULAR | Status: AC
  Filled 2014-11-03: qty 2

## 2014-11-03 MED ORDER — PROPOFOL 10 MG/ML IV BOLUS
INTRAVENOUS | Status: AC
  Filled 2014-11-03: qty 20

## 2014-11-03 MED ORDER — ARTIFICIAL TEARS OP OINT
TOPICAL_OINTMENT | OPHTHALMIC | Status: DC | PRN
  Administered 2014-11-03: 1 via OPHTHALMIC

## 2014-11-03 MED ORDER — OXYCODONE HCL 5 MG PO TABS: 5.0000 mg | ORAL_TABLET | Freq: Once | ORAL | Status: DC | PRN

## 2014-11-03 MED ORDER — ACETAMINOPHEN 325 MG PO TABS: 325.0000 mg | ORAL_TABLET | ORAL | Status: DC | PRN

## 2014-11-03 SURGICAL SUPPLY — 50 items
APPLIER CLIP 5 13 M/L LIGAMAX5 (MISCELLANEOUS) ×2
APR CLP MED LRG 5 ANG JAW (MISCELLANEOUS) ×1
BAG SPEC RTRVL 10 TROC 200 (ENDOMECHANICALS) ×1
BLADE SURG ROTATE 9660 (MISCELLANEOUS) IMPLANT
CANISTER SUCTION 2500CC (MISCELLANEOUS) ×2 IMPLANT
CHLORAPREP W/TINT 26ML (MISCELLANEOUS) ×2 IMPLANT
CLIP APPLIE 5 13 M/L LIGAMAX5 (MISCELLANEOUS) ×1 IMPLANT
COVER MAYO STAND STRL (DRAPES) ×2 IMPLANT
COVER SURGICAL LIGHT HANDLE (MISCELLANEOUS) ×2 IMPLANT
DECANTER SPIKE VIAL GLASS SM (MISCELLANEOUS) ×1 IMPLANT
DRAPE C-ARM 42X72 X-RAY (DRAPES) ×2 IMPLANT
ELECT REM PT RETURN 9FT ADLT (ELECTROSURGICAL) ×2
ELECTRODE REM PT RTRN 9FT ADLT (ELECTROSURGICAL) ×1 IMPLANT
FILTER SMOKE EVAC LAPAROSHD (FILTER) IMPLANT
GLOVE BIO SURGEON STRL SZ 6.5 (GLOVE) ×1 IMPLANT
GLOVE BIO SURGEON STRL SZ7 (GLOVE) ×2 IMPLANT
GLOVE BIO SURGEON STRL SZ8 (GLOVE) ×2 IMPLANT
GLOVE BIOGEL PI IND STRL 6.5 (GLOVE) IMPLANT
GLOVE BIOGEL PI IND STRL 7.0 (GLOVE) IMPLANT
GLOVE BIOGEL PI IND STRL 7.5 (GLOVE) IMPLANT
GLOVE BIOGEL PI IND STRL 8 (GLOVE) ×1 IMPLANT
GLOVE BIOGEL PI INDICATOR 6.5 (GLOVE) ×1
GLOVE BIOGEL PI INDICATOR 7.0 (GLOVE) ×1
GLOVE BIOGEL PI INDICATOR 7.5 (GLOVE) ×1
GLOVE BIOGEL PI INDICATOR 8 (GLOVE) ×1
GLOVE ECLIPSE 7.5 STRL STRAW (GLOVE) ×1 IMPLANT
GOWN STRL REUS W/ TWL LRG LVL3 (GOWN DISPOSABLE) ×2 IMPLANT
GOWN STRL REUS W/ TWL XL LVL3 (GOWN DISPOSABLE) ×1 IMPLANT
GOWN STRL REUS W/TWL LRG LVL3 (GOWN DISPOSABLE) ×6
GOWN STRL REUS W/TWL XL LVL3 (GOWN DISPOSABLE) ×2
KIT BASIN OR (CUSTOM PROCEDURE TRAY) ×2 IMPLANT
KIT ROOM TURNOVER OR (KITS) ×2 IMPLANT
LIQUID BAND (GAUZE/BANDAGES/DRESSINGS) ×2 IMPLANT
NEEDLE 22X1 1/2 (OR ONLY) (NEEDLE) ×1 IMPLANT
NS IRRIG 1000ML POUR BTL (IV SOLUTION) ×2 IMPLANT
PAD ARMBOARD 7.5X6 YLW CONV (MISCELLANEOUS) ×2 IMPLANT
POUCH RETRIEVAL ECOSAC 10 (ENDOMECHANICALS) ×1 IMPLANT
POUCH RETRIEVAL ECOSAC 10MM (ENDOMECHANICALS) ×1
SCISSORS LAP 5X35 DISP (ENDOMECHANICALS) ×2 IMPLANT
SET CHOLANGIOGRAPH 5 50 .035 (SET/KITS/TRAYS/PACK) ×2 IMPLANT
SET IRRIG TUBING LAPAROSCOPIC (IRRIGATION / IRRIGATOR) ×2 IMPLANT
SLEEVE ENDOPATH XCEL 5M (ENDOMECHANICALS) ×4 IMPLANT
SPECIMEN JAR SMALL (MISCELLANEOUS) ×2 IMPLANT
SUT VIC AB 4-0 PS2 27 (SUTURE) ×2 IMPLANT
TOWEL OR 17X24 6PK STRL BLUE (TOWEL DISPOSABLE) ×2 IMPLANT
TOWEL OR 17X26 10 PK STRL BLUE (TOWEL DISPOSABLE) ×2 IMPLANT
TRAY LAPAROSCOPIC (CUSTOM PROCEDURE TRAY) ×2 IMPLANT
TROCAR XCEL BLUNT TIP 100MML (ENDOMECHANICALS) ×2 IMPLANT
TROCAR XCEL NON-BLD 5MMX100MML (ENDOMECHANICALS) ×2 IMPLANT
TUBING INSUFFLATION (TUBING) ×2 IMPLANT

## 2014-11-03 NOTE — Progress Notes (Signed)
Glasses returned to pt 

## 2014-11-03 NOTE — Op Note (Signed)
11/03/2014  8:20 AM  PATIENT:  Andrea [name]  39 y.o. female  PRE-OPERATIVE DIAGNOSIS:  gallbladder polyp, RUQ pain  POST-OPERATIVE DIAGNOSIS:  gallbladder polyp, RUQ pain  PROCEDURE:  Procedure(s): LAPAROSCOPIC CHOLECYSTECTOMY WITH INTRAOPERATIVE CHOLANGIOGRAM  SURGEON:  Surgeon(s): Georganna Skeans, MD  ASSISTANTS: Judyann Munson, RNFA   ANESTHESIA:   local and general  EBL:     BLOOD ADMINISTERED:none  DRAINS: none   SPECIMEN:  Excision  DISPOSITION OF SPECIMEN:  PATHOLOGY  COUNTS:  YES  DICTATION: .Dragon Dictation Andrea [name] presents for laparoscopic cholecystectomy. She was identified in the preop holding. She received intravenous antibiotics. Informed consent was obtained. She was brought to the operating room and general endotracheal anesthesia was administered by the anesthesia staff. Her abdomen was prepped and draped in sterile fashion. Time out procedure was done. Infra-umbilical incision was made along her previous scar after injecting local. Subcutaneous tissues were dissected down revealing the anterior fascia. This was divided sharply along the midline and the peritoneal cavity was entered under direct vision. 0 Vicryl pursestring was placed around the fascial opening. Hassan trocar was inserted and the abdomen was insufflated with carbon dioxide in standard fashion. Under direct vision, 5 mm epigastric and 5 mm lateral right port 2 were placed. Local was used at each port site. The dome the gallbladder was retracted superior medially. There were no obvious visible abnormalities of the gallbladder or nearby liver. The infundibulum was retracted inferolaterally. Dissection began laterally and progressed medially easily identifying the cystic duct. This was dissected until we had a critical view between the cystic duct, the infundibulum, and the liver. Once we had this visualization, a clip was placed on the infundibular cystic duct junction. Small nick was made in  the cystic duct. A catheter was inserted. Intraoperative cringed and was obtained demonstrating good flow of contrast into the duodenum without filling defect in the common bile duct. Cholangiogram catheter was removed and 3 clips were placed proximally on the cystic duct. It was divided. Further dissection revealed an anterior and posterior branch of the cystic artery. These were both clipped twice proximally and divided distally with cautery. Gallbladder was taken off the liver bed with Bovie cautery achieving excellent hemostasis. Gallbladder was placed in a bag and removed from the abdomen via the infraumbilical port site. Liver bed was irrigated. Clips were confirmed in good position. Hemostasis was ensured and the liver bed. Irrigation fluid returned clear. Ports removed under direct vision. Pneumoperitoneum was released. Infraumbilical fascia was closed by tying the pursestring. All 4 wounds were copiously irrigated and the skin of each was closed with running 4 Vicryl subcuticular followed by liquid band. All counts were correct. Patient tolerated procedure well without apparent complication and was taken recovery in stable condition.  PATIENT DISPOSITION:  PACU - hemodynamically stable.   Delay start of Pharmacological VTE agent (>24hrs) due to surgical blood loss or risk of bleeding:  no  Georganna Skeans, MD, MPH, FACS Pager: [phone]  6/16/20168:20 AM

## 2014-11-03 NOTE — Anesthesia Preprocedure Evaluation (Signed)
Anesthesia Evaluation  Patient identified by MRN, date of birth, ID band Patient awake    Reviewed: Allergy & Precautions, NPO status , Patient's Chart, lab work & pertinent test results  History of Anesthesia Complications Negative for: history of anesthetic complications  Airway Mallampati: II  TM Distance: >3 FB Neck ROM: Full    Dental  (+) Teeth Intact   Pulmonary neg shortness of breath, neg sleep apnea, neg COPDneg recent URI, former smoker,  breath sounds clear to auscultation        Cardiovascular hypertension, Pt. on medications Rhythm:Regular     Neuro/Psych  Headaches, PSYCHIATRIC DISORDERS Anxiety Depression    GI/Hepatic GERD-  Medicated and Poorly Controlled,Gallbladder polyp    Endo/Other  Hypothyroidism Morbid obesity  Renal/GU negative Renal ROS     Musculoskeletal  (+) Arthritis -,   Abdominal   Peds  Hematology Protein s deficiency    Anesthesia Other Findings   Reproductive/Obstetrics                             Anesthesia Physical Anesthesia Plan  ASA: III  Anesthesia Plan: General   Post-op Pain Management:    Induction: Intravenous  Airway Management Planned: Oral ETT  Additional Equipment: None  Intra-op Plan:   Post-operative Plan: Extubation in OR  Informed Consent: I have reviewed the patients History and Physical, chart, labs and discussed the procedure including the risks, benefits and alternatives for the proposed anesthesia with the patient or authorized representative who has indicated his/her understanding and acceptance.   Dental advisory given  Plan Discussed with: CRNA and Surgeon  Anesthesia Plan Comments:         Anesthesia Quick Evaluation

## 2014-11-03 NOTE — Transfer of Care (Signed)
Immediate Anesthesia Transfer of Care Note  Patient: Andrea [name]  Procedure(s) Performed: Procedure(s): LAPAROSCOPIC CHOLECYSTECTOMY WITH INTRAOPERATIVE CHOLANGIOGRAM (N/A)  Patient Location: PACU  Anesthesia Type:General  Level of Consciousness: awake, alert , oriented and patient cooperative  Airway & Oxygen Therapy: Patient Spontanous Breathing and Patient connected to face mask oxygen  Post-op Assessment: Report given to RN, Post -op Vital signs reviewed and stable and Patient moving all extremities X 4  Post vital signs: Reviewed and stable  Last Vitals:  Filed Vitals:   11/03/14 0618  BP: 145/106  Pulse:   Temp:   Resp:     Complications: No apparent anesthesia complications

## 2014-11-03 NOTE — Discharge Instructions (Signed)

## 2014-11-03 NOTE — Interval H&P Note (Signed)
History and Physical Interval Note:  11/03/2014 6:51 AM  Andrea [name]  has presented today for surgery, with the diagnosis of gallbladder polyp  The various methods of treatment have been discussed with the patient and family. After consideration of risks, benefits and other options for treatment, the patient has consented to  Procedure(s): LAPAROSCOPIC CHOLECYSTECTOMY WITH INTRAOPERATIVE CHOLANGIOGRAM (N/A) as a surgical intervention .  The patient's history has been reviewed, patient re-examined, no change in status, stable for surgery.  I have reviewed the patient's chart and labs.  Questions were answered to the patient's satisfaction.     Janel Beane E

## 2014-11-03 NOTE — Anesthesia Procedure Notes (Signed)
Procedure Name: Intubation Date/Time: 11/03/2014 7:36 AM Performed by: Rogers Blocker Pre-anesthesia Checklist: Patient identified, Timeout performed, Emergency Drugs available, Suction available and Patient being monitored Patient Re-evaluated:Patient Re-evaluated prior to inductionOxygen Delivery Method: Circle system utilized Preoxygenation: Pre-oxygenation with 100% oxygen Intubation Type: IV induction Ventilation: Mask ventilation without difficulty Laryngoscope Size: Mac and 3 Grade View: Grade I Tube type: Oral Tube size: 7.5 mm Number of attempts: 1 Airway Equipment and Method: Stylet Placement Confirmation: ETT inserted through vocal cords under direct vision,  breath sounds checked- equal and bilateral,  positive ETCO2 and CO2 detector Secured at: 21 cm Tube secured with: Tape Dental Injury: Teeth and Oropharynx as per pre-operative assessment

## 2014-11-04 ENCOUNTER — Encounter (HOSPITAL_COMMUNITY): Payer: Self-pay | Admitting: General Surgery

## 2014-11-04 NOTE — Anesthesia Postprocedure Evaluation (Signed)
  Anesthesia Post-op Note  Patient: Andrea [name]  Procedure(s) Performed: Procedure(s): LAPAROSCOPIC CHOLECYSTECTOMY WITH INTRAOPERATIVE CHOLANGIOGRAM (N/A)  Patient Location: PACU  Anesthesia Type:General  Level of Consciousness: awake  Airway and Oxygen Therapy: Patient Spontanous Breathing  Post-op Pain: mild  Post-op Assessment: Post-op Vital signs reviewed, Patient's Cardiovascular Status Stable, Respiratory Function Stable, Patent Airway, No signs of Nausea or vomiting and Pain level controlled              Post-op Vital Signs: Reviewed and stable  Last Vitals:  Filed Vitals:   11/03/14 1330  BP: 146/92  Pulse: 88  Temp:   Resp: 20    Complications: No apparent anesthesia complications

## 2014-11-08 ENCOUNTER — Encounter: Payer: PPO | Admitting: Internal Medicine

## 2014-11-09 ENCOUNTER — Telehealth: Payer: Self-pay | Admitting: *Deleted

## 2014-11-09 NOTE — Telephone Encounter (Signed)
Received Prior Authorization request from Andrea [name] #: [id number] for prior auth for Esomeprazole (Nexium) 40mg  One by mouth daily. Spoke with Andrea [name] with insurance company #: [phone] and will be faxing a form to be completed and faxed back. Patient ID #: [id number] Dx: K21.9 GERD Dr. Francene Finders: [phone]

## 2014-11-10 NOTE — Telephone Encounter (Signed)
Received fax form from [employer] [phone] Fax #: [phone]. Completed form and given to Dr. Eulas Post to review and sign. Will fax back.

## 2014-11-11 NOTE — Telephone Encounter (Signed)
Received call from [employer]-needing more questions answered regarding the prior authorization. #[id number] Reference #: [id number].  LMOM at the above number to return call.

## 2014-11-15 NOTE — Telephone Encounter (Signed)
Received fax from [facility] and medication was Approved from 11/14/2014-05/20/2015.

## 2014-12-20 ENCOUNTER — Encounter: Payer: Self-pay | Admitting: Internal Medicine

## 2014-12-20 ENCOUNTER — Ambulatory Visit (AMBULATORY_SURGERY_CENTER): Payer: PPO | Admitting: Internal Medicine

## 2014-12-20 VITALS — BP 119/71 | HR 62 | Temp 97.8°F | Resp 16 | Ht 69.0 in | Wt 250.0 lb

## 2014-12-20 DIAGNOSIS — K219 Gastro-esophageal reflux disease without esophagitis: Secondary | ICD-10-CM | POA: Diagnosis not present

## 2014-12-20 DIAGNOSIS — K625 Hemorrhage of anus and rectum: Secondary | ICD-10-CM | POA: Diagnosis not present

## 2014-12-20 DIAGNOSIS — R197 Diarrhea, unspecified: Secondary | ICD-10-CM

## 2014-12-20 DIAGNOSIS — K589 Irritable bowel syndrome without diarrhea: Secondary | ICD-10-CM | POA: Diagnosis not present

## 2014-12-20 DIAGNOSIS — R1011 Right upper quadrant pain: Secondary | ICD-10-CM | POA: Diagnosis not present

## 2014-12-20 MED ORDER — CHOLESTYRAMINE 4 G PO PACK: 4.0000 g | PACK | Freq: Two times a day (BID) | ORAL | Status: DC

## 2014-12-20 MED ORDER — SODIUM CHLORIDE 0.9 % IV SOLN: 500.0000 mL | INTRAVENOUS | Status: DC

## 2014-12-20 NOTE — Patient Instructions (Signed)
Discharge instructions given. Prescription sent into pharmacy. Resume previous medications. YOU HAD AN ENDOSCOPIC PROCEDURE TODAY AT [facility]:   Refer to the procedure report that was given to you for any specific questions about what was found during the examination.  If the procedure report does not answer your questions, please call your gastroenterologist to clarify.  If you requested that your care partner not be given the details of your procedure findings, then the procedure report has been included in a sealed envelope for you to review at your convenience later.  YOU SHOULD EXPECT: Some feelings of bloating in the abdomen. Passage of more gas than usual.  Walking can help get rid of the air that was put into your GI tract during the procedure and reduce the bloating. If you had a lower endoscopy (such as a colonoscopy or flexible sigmoidoscopy) you may notice spotting of blood in your stool or on the toilet paper. If you underwent a bowel prep for your procedure, you may not have a normal bowel movement for a few days.  Please Note:  You might notice some irritation and congestion in your nose or some drainage.  This is from the oxygen used during your procedure.  There is no need for concern and it should clear up in a day or so.  SYMPTOMS TO REPORT IMMEDIATELY:   Following lower endoscopy (colonoscopy or flexible sigmoidoscopy):  Excessive amounts of blood in the stool  Significant tenderness or worsening of abdominal pains  Swelling of the abdomen that is new, acute  Fever of 100F or higher   Following upper endoscopy (EGD)  Vomiting of blood or coffee ground material  New chest pain or pain under the shoulder blades  Painful or persistently difficult swallowing  New shortness of breath  Fever of 100F or higher  Black, tarry-looking stools  For urgent or emergent issues, a gastroenterologist can be reached at any hour by calling [phone].   DIET:  Your first meal following the procedure should be a small meal and then it is ok to progress to your normal diet. Heavy or fried foods are harder to digest and may make you feel nauseous or bloated.  Likewise, meals heavy in dairy and vegetables can increase bloating.  Drink plenty of fluids but you should avoid alcoholic beverages for 24 hours.  ACTIVITY:  You should plan to take it easy for the rest of today and you should NOT DRIVE or use heavy machinery until tomorrow (because of the sedation medicines used during the test).    FOLLOW UP: Our staff will call the number listed on your records the next business day following your procedure to check on you and address any questions or concerns that you may have regarding the information given to you following your procedure. If we do not reach you, we will leave a message.  However, if you are feeling well and you are not experiencing any problems, there is no need to return our call.  We will assume that you have returned to your regular daily activities without incident.  If any biopsies were taken you will be contacted by phone or by letter within the next 1-3 weeks.  Please call us at [phone] if you have not heard about the biopsies in 3 weeks.    SIGNATURES/CONFIDENTIALITY: You and/or your care partner have signed paperwork which will be entered into your electronic medical record.  These signatures attest to the fact that that the  information above on your After Visit Summary has been reviewed and is understood.  Full responsibility of the confidentiality of this discharge information lies with you and/or your care-partner.

## 2014-12-20 NOTE — Progress Notes (Signed)
Transferred to recovery room. A/O x3, pleased with MAC.  VSS.  Report to Cecelia, RN. 

## 2014-12-20 NOTE — Op Note (Signed)
[place]  [address] [place], [id number]   COLONOSCOPY PROCEDURE REPORT  PATIENT: Andrea [name], Andrea [name]  MR#: [id number] BIRTHDATE: 04/25/1976 , 38  yrs. old GENDER: female ENDOSCOPIST: Eustace Quail, MD REFERRED BY:.  Self / Office PROCEDURE DATE:  12/20/2014 PROCEDURE:   Colonoscopy, diagnostic First Screening Colonoscopy - Avg.  risk and is 50 yrs.  old or older - No.  Prior Negative Screening - Now for repeat screening. N/A  History of Adenoma - Now for follow-up colonoscopy & has been > or = to 3 yrs.  N/A  Polyps removed today? No Recommend repeat exam, <10 yrs? No ASA CLASS:   Class II INDICATIONS:Evaluation of unexplained GI bleeding and Patient is not applicable for Colorectal Neoplasm Risk Assessment for this procedure. The patient had normal colonoscopy 2008, including intubation of the ileum. MEDICATIONS: Monitored anesthesia care and Propofol 250 mg IV  DESCRIPTION OF PROCEDURE:   After the risks benefits and alternatives of the procedure were thoroughly explained, informed consent was obtained.  The digital rectal exam revealed palpable benign anal papilla.   The LB [id number]  endoscope was introduced through the anus and advanced to the cecum, which was identified by both the appendix and ileocecal valve. No adverse events experienced.   The quality of the prep was excellent. (MoviPrep was used)  The instrument was then slowly withdrawn as the colon was fully examined. Estimated blood loss is zero unless otherwise noted in this procedure report.      COLON FINDINGS: A normal appearing cecum, ileocecal valve, and appendiceal orifice were identified.  The ascending, transverse, descending, sigmoid colon, and rectum appeared unremarkable. Retroflexed views revealed small internal hemorrhoids and benign hypertrophic anal papilla. The time to cecum = 2.3 Withdrawal time = 6.7   The scope was withdrawn and the procedure  completed. COMPLICATIONS: There were no immediate complications.  ENDOSCOPIC IMPRESSION: 1. Normal colonoscopy . No significant pathology aside from small hemorrhoids to explain bleeding 2. Worsening diarrhea postcholecystectomy  RECOMMENDATIONS: 1. Continue current colorectal screening recommendations for "routine risk" patients with a repeat colonoscopy in 10 years. 2. Prescribed Questran 4 g twice daily in water or juice. Dispense 1 month. 6 refills 3. EGD today. Please see report  eSigned:  Eustace Quail, MD 12/20/2014 2:27 PM   cc: The Patient and Gildardo Cranker

## 2014-12-20 NOTE — Op Note (Signed)
[place]  [address]. [place], [id number]   ENDOSCOPY PROCEDURE REPORT  PATIENT: Andrea, [name]  MR#: [id number] BIRTHDATE: 03/16/1976 , 38  yrs. old GENDER: female ENDOSCOPIST: Eustace Quail, MD REFERRED BY:  .  Self / Office PROCEDURE DATE:  12/20/2014 PROCEDURE:  EGD, diagnostic ASA CLASS:     Class II INDICATIONS:  abdominal pain.  . Reports resolution of right upper quadrant pain postcholecystectomy. Describes "esophageal burning" with cold and warm liquids. Not on antireflux medication MEDICATIONS: Monitored anesthesia care and Propofol 150 mg IV TOPICAL ANESTHETIC: none  DESCRIPTION OF PROCEDURE: After the risks benefits and alternatives of the procedure were thoroughly explained, informed consent was obtained.  The LB [id number] endoscope was introduced through the mouth and advanced to the second portion of the duodenum , Without limitations.  The instrument was slowly withdrawn as the mucosa was fully examined.   EXAM: The esophagus and gastroesophageal junction were completely normal in appearance.  The stomach was entered and closely examined.The antrum, angularis, and lesser curvature were well visualized, including a retroflexed view of the cardia and fundus. The stomach wall was normally distensable.  The scope passed easily through the pylorus into the duodenum.  Retroflexed views revealed no abnormalities.     The scope was then withdrawn from the patient and the procedure completed.  COMPLICATIONS: There were no immediate complications.  ENDOSCOPIC IMPRESSION: 1. Normal EGD 2. GERD  RECOMMENDATIONS: 1. Anti-reflux regimen to be followed 2. GI follow-up as needed  REPEAT EXAM:  eSigned:  Eustace Quail, MD 12/20/2014 2:34 PM    CC:The Patient

## 2014-12-21 ENCOUNTER — Telehealth: Payer: Self-pay | Admitting: *Deleted

## 2014-12-21 NOTE — Telephone Encounter (Signed)
  Follow up Call-  Call back number 12/20/2014  Post procedure Call Back phone  # [phone]  Permission to leave phone message Yes     Patient questions:  Do you have a fever, pain , or abdominal swelling? No. Pain Score  0 *  Have you tolerated food without any problems? Yes.    Have you been able to return to your normal activities? Yes.    Do you have any questions about your discharge instructions: Diet   No. Medications  No. Follow up visit  No.  Do you have questions or concerns about your Care? No.  Actions: * If pain score is 4 or above: No action needed, pain <4.

## 2014-12-21 NOTE — Telephone Encounter (Signed)
  Follow up Call-  Call back number 12/20/2014  Post procedure Call Back phone  # [phone]  Permission to leave phone message Yes     Patient questions:  Do you have a fever, pain , or abdominal swelling? No. Pain Score  0 *  Have you tolerated food without any problems? Yes.    Have you been able to return to your normal activities? Yes.    Do you have any questions about your discharge instructions: Diet   No. Medications  No. Follow up visit  No.  Do you have questions or concerns about your Care? No.  Actions: * If pain score is 4 or above: No action needed, pain <4.

## 2015-01-17 ENCOUNTER — Other Ambulatory Visit: Payer: Self-pay | Admitting: Nurse Practitioner

## 2015-01-17 DIAGNOSIS — N6324 Unspecified lump in the left breast, lower inner quadrant: Secondary | ICD-10-CM

## 2015-01-18 ENCOUNTER — Encounter: Payer: PPO | Admitting: Internal Medicine

## 2015-01-20 ENCOUNTER — Ambulatory Visit
Admission: RE | Admit: 2015-01-20 | Discharge: 2015-01-20 | Disposition: A | Discharge status: Home / Self Care | Payer: PPO | Source: Ambulatory Visit | Attending: Nurse Practitioner | Admitting: Nurse Practitioner

## 2015-01-20 DIAGNOSIS — N6324 Unspecified lump in the left breast, lower inner quadrant: Secondary | ICD-10-CM

## 2015-02-01 ENCOUNTER — Ambulatory Visit (INDEPENDENT_AMBULATORY_CARE_PROVIDER_SITE_OTHER): Payer: PPO | Admitting: Internal Medicine

## 2015-02-01 ENCOUNTER — Encounter: Payer: Self-pay | Admitting: Internal Medicine

## 2015-02-01 VITALS — BP 118/80 | HR 76 | Temp 98.3°F | Resp 18 | Ht 69.0 in | Wt 242.6 lb

## 2015-02-01 DIAGNOSIS — F4010 Social phobia, unspecified: Secondary | ICD-10-CM | POA: Diagnosis not present

## 2015-02-01 DIAGNOSIS — I10 Essential (primary) hypertension: Secondary | ICD-10-CM | POA: Diagnosis not present

## 2015-02-01 DIAGNOSIS — F42 Obsessive-compulsive disorder: Secondary | ICD-10-CM | POA: Diagnosis not present

## 2015-02-01 DIAGNOSIS — E038 Other specified hypothyroidism: Secondary | ICD-10-CM

## 2015-02-01 DIAGNOSIS — N6019 Diffuse cystic mastopathy of unspecified breast: Secondary | ICD-10-CM

## 2015-02-01 DIAGNOSIS — F331 Major depressive disorder, recurrent, moderate: Secondary | ICD-10-CM | POA: Diagnosis not present

## 2015-02-01 DIAGNOSIS — E559 Vitamin D deficiency, unspecified: Secondary | ICD-10-CM

## 2015-02-01 DIAGNOSIS — E785 Hyperlipidemia, unspecified: Secondary | ICD-10-CM

## 2015-02-01 DIAGNOSIS — Z23 Encounter for immunization: Secondary | ICD-10-CM

## 2015-02-01 DIAGNOSIS — Z0000 Encounter for general adult medical examination without abnormal findings: Secondary | ICD-10-CM

## 2015-02-01 DIAGNOSIS — K219 Gastro-esophageal reflux disease without esophagitis: Secondary | ICD-10-CM | POA: Diagnosis not present

## 2015-02-01 DIAGNOSIS — M5416 Radiculopathy, lumbar region: Secondary | ICD-10-CM

## 2015-02-01 DIAGNOSIS — E034 Atrophy of thyroid (acquired): Secondary | ICD-10-CM

## 2015-02-01 DIAGNOSIS — F429 Obsessive-compulsive disorder, unspecified: Secondary | ICD-10-CM

## 2015-02-01 MED ORDER — ROSUVASTATIN CALCIUM 10 MG PO TABS: 10.0000 mg | ORAL_TABLET | Freq: Every day | ORAL | Status: DC

## 2015-02-01 MED ORDER — EZETIMIBE 10 MG PO TABS: 10.0000 mg | ORAL_TABLET | Freq: Every day | ORAL | Status: DC

## 2015-02-01 MED ORDER — VITAMIN D (ERGOCALCIFEROL) 1.25 MG (50000 UNIT) PO CAPS: 50000.0000 [IU] | ORAL_CAPSULE | ORAL | Status: DC

## 2015-02-01 MED ORDER — LOSARTAN POTASSIUM 100 MG PO TABS: 100.0000 mg | ORAL_TABLET | Freq: Every day | ORAL | Status: DC

## 2015-02-01 NOTE — Patient Instructions (Signed)
Continue current medications as ordered  Flu shot today  Fasting labs in 1-2 weeks. Will call with results  Follow up in 4 mos  Encouraged her to exercise 30-45 minutes 4-5 times per week. Eat a well balanced diet. Avoid smoking. Limit alcohol intake. Wear seatbelt when riding in the car. Wear sun block (SPF >50) when spending extended times outside.

## 2015-02-01 NOTE — Progress Notes (Signed)
Patient ID: Andrea [name], female   DOB: [birth year], 39 y.o.   MRN: [id number] Subjective:     Andrea [name] is a 39 y.o. female and is here for a comprehensive physical exam. The patient reports problems - diarrhea. She is s/p lap chole for gallbladder polyps. She takes cholestyramine as Rx but started to have constipation and reduced it to daily dosing. She changed her diet due to increased diarrhea with certain foods. Takes imodium prn diarrhea. No further dyspepsia.  She is s/p TAH. She sees GYN for pap smears. She had a mammogram due to painful palpable lumps which showed dense breasts and cysts present.  Past Medical History  Diagnosis Date  . Hypertension   . MVP (mitral valve prolapse)     echo per dr Dagmar Hait  . Anxiety   . Depression   . Headache(784.0)   . Protein S deficiency 2003    dvt/pulmonary embolus-on coumadin for 6 months then off-Sees [facility]  . Arthritis     ruptured lumbar disc-careful with positioning  . Hypothyroidism   . Blood dyscrasia     protein s deficiency-no treatment since 2006  . Hyperlipemia   . IBS (irritable bowel syndrome)   . GERD (gastroesophageal reflux disease)   . Hemorrhoids   . Pulmonary embolism   . PVC (premature ventricular contraction)   . High cholesterol    Past Surgical History  Procedure Laterality Date  . Vaginal reconstructive surgery  2001  . Cesarean section  2006  . Tonsillectomy  92  . Laparoscopic assisted vaginal hysterectomy  03/27/2011    Procedure: LAPAROSCOPIC ASSISTED VAGINAL HYSTERECTOMY;  Surgeon: Cyril Mourning, MD;  Location: [facility];  Service: Gynecology;  Laterality: N/A;  . Salpingoophorectomy  03/27/2011    Procedure: SALPINGO OOPHERECTOMY;  Surgeon: Cyril Mourning, MD;  Location: [facility];  Service: Gynecology;  Laterality: Bilateral;  . Cholecystectomy N/A 11/03/2014    Procedure: LAPAROSCOPIC CHOLECYSTECTOMY WITH INTRAOPERATIVE CHOLANGIOGRAM;  Surgeon: Georganna Skeans, MD;  Location:  [facility];  Service: General;  Laterality: N/A;   Family History  Problem Relation Age of Onset  . Hypertension Other     entire family on both sides  . Hyperlipidemia Father     fathers side of family  . Asthma Son   . Asthma Son   . Clotting disorder Maternal Uncle   . Clotting disorder Paternal Grandmother   . Heart disease Maternal Grandfather   . High blood pressure Father   . High blood pressure Mother     Social History   Social History  . Marital Status: Divorced    Spouse Name: N/A  . Number of Children: 2  . Years of Education: N/A   Occupational History  . nutrition [employer] History Main Topics  . Smoking status: Former Smoker -- 1.00 packs/day for 15 years    Types: Cigarettes    Quit date: 03/18/2004  . Smokeless tobacco: Never Used  . Alcohol Use: Yes     Comment: rarely  . Drug Use: No  . Sexual Activity: Not on file   Other Topics Concern  . Not on file   Social History Narrative   Diet: Regular   Caffeine: Yes   Married- divorced, married in 2006   House: Yes, 2 persons   Pets: 1 dog   Current/Past profession: [employer] 2002-2013   Exercise: Yes, walking   Living Will: No    DNR: No    POA/HPOA:  No          Health Maintenance  Topic Date Due  . HIV Screening  03/12/1989  . INFLUENZA VACCINE  12/19/2014  . PAP SMEAR  02/04/2017  . TETANUS/TDAP  01/08/2024    Review of Systems   Review of Systems  Constitutional: Negative for fever, chills and malaise/fatigue.  HENT: Negative for sore throat and tinnitus.   Eyes: Negative for blurred vision and double vision.  Respiratory: Negative for cough, shortness of breath and wheezing.   Cardiovascular: Negative for chest pain, palpitations, orthopnea and leg swelling.  Gastrointestinal: Positive for diarrhea and constipation. Negative for heartburn, nausea, vomiting, abdominal pain and blood in stool.  Genitourinary: Negative for dysuria, urgency, frequency and  hematuria.  Musculoskeletal: Positive for back pain and joint pain. Negative for myalgias and falls.  Skin: Negative for rash.  Neurological: Negative for dizziness, tingling, tremors, sensory change, focal weakness, seizures, loss of consciousness, weakness and headaches.  Endo/Heme/Allergies: Negative for environmental allergies. Does not bruise/bleed easily.  Psychiatric/Behavioral: Positive for depression. Negative for memory loss. The patient is nervous/anxious. The patient does not have insomnia.      Objective:      Physical Exam  Constitutional: She is oriented to person, place, and time and well-developed, well-nourished, and in no distress.  HENT:  Head: Normocephalic and atraumatic.  Right Ear: External ear normal.  Left Ear: External ear normal.  Mouth/Throat: Oropharynx is clear and moist. No oropharyngeal exudate.  Eyes: Conjunctivae and EOM are normal. Pupils are equal, round, and reactive to light. No scleral icterus.  Neck: Normal range of motion. Neck supple. Carotid bruit is not present. No tracheal deviation present. No thyromegaly present.  Cardiovascular: Normal rate, regular rhythm, normal heart sounds and intact distal pulses.  Exam reveals no gallop and no friction rub.   No murmur heard. Pulmonary/Chest: Effort normal and breath sounds normal. She has no wheezes. She has no rhonchi. She has no rales. She exhibits no tenderness. Right breast exhibits no inverted nipple, no mass, no nipple discharge, no skin change and no tenderness. Left breast exhibits no inverted nipple, no mass, no nipple discharge, no skin change and no tenderness. Breasts are symmetrical.  Abdominal: Soft. Bowel sounds are normal. She exhibits no distension and no mass. There is no hepatosplenomegaly. There is no tenderness. There is no rebound and no guarding.  Laparoscopic incisional scars noted  Genitourinary:  Deferred to GYN  Musculoskeletal: She exhibits edema and tenderness.  Lumbar  lordosis  Lymphadenopathy:    She has no cervical adenopathy.  Neurological: She is alert and oriented to person, place, and time. She has normal reflexes. Gait normal.  Skin: Skin is warm and dry. Rash (eczematous rash, generalized) noted.  Psychiatric: Memory, affect and judgment normal. Her mood appears anxious.      Assessment:    Healthy female exam.     ICD-9-CM ICD-10-CM   1. Well adult exam V70.0 Z00.00   2. Essential hypertension 401.9 I10 losartan (COZAAR) 100 MG tablet     CMP     Urinalysis with Reflex Microscopic  3. Fibrocystic breast disease, unspecified laterality 610.1 N60.19   4. Hypothyroidism due to acquired atrophy of thyroid 244.8 E03.8 TSH   246.8 E03.4 T4, Free  5. Major depressive disorder, recurrent episode, moderate 296.32 F33.1   6. Social anxiety disorder 300.23 F40.10   7. OCD (obsessive compulsive disorder) 300.3 F42   8. Gastroesophageal reflux disease, esophagitis presence not specified 530.81 K21.9   9. Hyperlipidemia  LDL goal <100 272.4 E78.5 ezetimibe (ZETIA) 10 MG tablet     rosuvastatin (CRESTOR) 10 MG tablet     Lipid Panel  10. Chronic lumbar radiculopathy 724.4 M54.16   11. Vitamin D deficiency 268.9 E55.9 Vitamin D, Ergocalciferol, (DRISDOL) 50000 UNITS CAPS capsule     Vitamin D, 25-hydroxy    Plan:     See After Visit Summary for Counseling Recommendations  Pt is UTD on health maintenance. Vaccinations are UTD. Pt maintains a healthy lifestyle. Encouraged pt to exercise 30-45 minutes 4-5 times per week. Eat a well balanced diet. Avoid smoking. Limit alcohol intake. Wear seatbelt when riding in the car. Wear sun block (SPF >50) when spending extended times outside.  Continue current medications as ordered  Flu shot today  Fasting labs in 1-2 weeks. Will call with results  Follow up in 4 mos for routine visit  Andrea Levay S. Perlie Gold  [facility] [address] [place], [place]  [address] [phone] Cell (Monday-Friday 8 AM - 5 PM) [phone] After 5 PM and follow prompts

## 2015-02-13 ENCOUNTER — Other Ambulatory Visit: Payer: PPO

## 2015-02-15 ENCOUNTER — Other Ambulatory Visit: Payer: Self-pay | Admitting: Obstetrics and Gynecology

## 2015-02-17 LAB — CYTOLOGY - PAP

## 2015-02-22 ENCOUNTER — Other Ambulatory Visit: Payer: PPO

## 2015-03-29 ENCOUNTER — Other Ambulatory Visit: Payer: PPO

## 2015-05-10 ENCOUNTER — Other Ambulatory Visit: Payer: Self-pay | Admitting: Internal Medicine

## 2015-06-01 DIAGNOSIS — M4726 Other spondylosis with radiculopathy, lumbar region: Secondary | ICD-10-CM | POA: Diagnosis not present

## 2015-06-01 DIAGNOSIS — G894 Chronic pain syndrome: Secondary | ICD-10-CM | POA: Diagnosis not present

## 2015-06-01 DIAGNOSIS — Z79891 Long term (current) use of opiate analgesic: Secondary | ICD-10-CM | POA: Diagnosis not present

## 2015-06-01 DIAGNOSIS — M5416 Radiculopathy, lumbar region: Secondary | ICD-10-CM | POA: Diagnosis not present

## 2015-06-07 ENCOUNTER — Ambulatory Visit: Payer: PPO | Admitting: Internal Medicine

## 2015-06-26 ENCOUNTER — Other Ambulatory Visit: Payer: Medicare HMO

## 2015-06-26 ENCOUNTER — Other Ambulatory Visit: Payer: PPO

## 2015-06-26 DIAGNOSIS — I10 Essential (primary) hypertension: Secondary | ICD-10-CM

## 2015-06-26 DIAGNOSIS — E039 Hypothyroidism, unspecified: Secondary | ICD-10-CM

## 2015-06-26 DIAGNOSIS — E034 Atrophy of thyroid (acquired): Secondary | ICD-10-CM

## 2015-06-27 LAB — URINALYSIS, ROUTINE W REFLEX MICROSCOPIC
Bilirubin, UA: NEGATIVE
Glucose, UA: NEGATIVE
Ketones, UA: NEGATIVE
Leukocytes, UA: NEGATIVE
Nitrite, UA: NEGATIVE
Protein, UA: NEGATIVE
RBC, UA: NEGATIVE
Specific Gravity, UA: >=1.03 — AB (ref 1.005–1.030)
Urobilinogen, Ur: 0.2 mg/dL (ref 0.2–1.0)
pH, UA: 5.5 (ref 5.0–7.5)

## 2015-06-27 LAB — CBC WITH DIFFERENTIAL/PLATELET
Basophils Absolute: 0 10*3/uL (ref 0.0–0.2)
Basos: 0 %
EOS (ABSOLUTE): 0.2 10*3/uL (ref 0.0–0.4)
Eos: 3 %
Hematocrit: 40.5 % (ref 34.0–46.6)
Hemoglobin: 13.5 g/dL (ref 11.1–15.9)
Immature Grans (Abs): 0 10*3/uL (ref 0.0–0.1)
Immature Granulocytes: 0 %
Lymphocytes Absolute: 2.5 10*3/uL (ref 0.7–3.1)
Lymphs: 38 %
MCH: 29.6 pg (ref 26.6–33.0)
MCHC: 33.3 g/dL (ref 31.5–35.7)
MCV: 89 fL (ref 79–97)
Monocytes Absolute: 0.3 10*3/uL (ref 0.1–0.9)
Monocytes: 5 %
Neutrophils Absolute: 3.5 10*3/uL (ref 1.4–7.0)
Neutrophils: 54 %
Platelets: 242 10*3/uL (ref 150–379)
RBC: 4.56 x10E6/uL (ref 3.77–5.28)
RDW: 13.2 % (ref 12.3–15.4)
WBC: 6.5 10*3/uL (ref 3.4–10.8)

## 2015-06-27 LAB — TSH: TSH: 4.61 u[IU]/mL — ABNORMAL HIGH (ref 0.450–4.500)

## 2015-06-27 LAB — COMPREHENSIVE METABOLIC PANEL
ALT: 40 IU/L — ABNORMAL HIGH (ref 0–32)
AST: 23 IU/L (ref 0–40)
Albumin/Globulin Ratio: 1.9 (ref 1.1–2.5)
Albumin: 4.3 g/dL (ref 3.5–5.5)
Alkaline Phosphatase: 102 IU/L (ref 39–117)
BUN/Creatinine Ratio: 13 (ref 9–23)
BUN: 12 mg/dL (ref 6–24)
Bilirubin Total: 0.5 mg/dL (ref 0.0–1.2)
CO2: 25 mmol/L (ref 18–29)
Calcium: 9.5 mg/dL (ref 8.7–10.2)
Chloride: 98 mmol/L (ref 96–106)
Creatinine, Ser: 0.93 mg/dL (ref 0.57–1.00)
GFR calc Af Amer: 88 mL/min/{1.73_m2} (ref >59)
GFR calc non Af Amer: 77 mL/min/{1.73_m2} (ref >59)
Globulin, Total: 2.3 g/dL (ref 1.5–4.5)
Glucose: 104 mg/dL — ABNORMAL HIGH (ref 65–99)
Potassium: 4.4 mmol/L (ref 3.5–5.2)
Sodium: 139 mmol/L (ref 134–144)
Total Protein: 6.6 g/dL (ref 6.0–8.5)

## 2015-06-27 LAB — LIPID PANEL
Chol/HDL Ratio: 5.7 ratio units — ABNORMAL HIGH (ref 0.0–4.4)
Cholesterol, Total: 238 mg/dL — ABNORMAL HIGH (ref 100–199)
HDL: 42 mg/dL (ref >39)
LDL Calculated: 126 mg/dL — ABNORMAL HIGH (ref 0–99)
Triglycerides: 348 mg/dL — ABNORMAL HIGH (ref 0–149)
VLDL Cholesterol Cal: 70 mg/dL — ABNORMAL HIGH (ref 5–40)

## 2015-06-27 LAB — T4, FREE: Free T4: 1.32 ng/dL (ref 0.82–1.77)

## 2015-06-27 LAB — VITAMIN D 25 HYDROXY (VIT D DEFICIENCY, FRACTURES): Vit D, 25-Hydroxy: 19.2 ng/mL — ABNORMAL LOW (ref 30.0–100.0)

## 2015-06-28 ENCOUNTER — Ambulatory Visit: Payer: PPO | Admitting: Internal Medicine

## 2015-07-19 ENCOUNTER — Encounter: Payer: Self-pay | Admitting: Internal Medicine

## 2015-07-19 ENCOUNTER — Ambulatory Visit (INDEPENDENT_AMBULATORY_CARE_PROVIDER_SITE_OTHER): Payer: Medicare HMO | Admitting: Internal Medicine

## 2015-07-19 VITALS — BP 130/96 | HR 70 | Temp 98.1°F | Resp 18 | Ht 69.0 in | Wt 245.6 lb

## 2015-07-19 DIAGNOSIS — E785 Hyperlipidemia, unspecified: Secondary | ICD-10-CM

## 2015-07-19 DIAGNOSIS — E559 Vitamin D deficiency, unspecified: Secondary | ICD-10-CM

## 2015-07-19 DIAGNOSIS — I10 Essential (primary) hypertension: Secondary | ICD-10-CM

## 2015-07-19 DIAGNOSIS — Z209 Contact with and (suspected) exposure to unspecified communicable disease: Secondary | ICD-10-CM | POA: Diagnosis not present

## 2015-07-19 DIAGNOSIS — F331 Major depressive disorder, recurrent, moderate: Secondary | ICD-10-CM

## 2015-07-19 DIAGNOSIS — K219 Gastro-esophageal reflux disease without esophagitis: Secondary | ICD-10-CM

## 2015-07-19 DIAGNOSIS — E038 Other specified hypothyroidism: Secondary | ICD-10-CM

## 2015-07-19 DIAGNOSIS — E034 Atrophy of thyroid (acquired): Secondary | ICD-10-CM

## 2015-07-19 DIAGNOSIS — R079 Chest pain, unspecified: Secondary | ICD-10-CM | POA: Diagnosis not present

## 2015-07-19 DIAGNOSIS — F4010 Social phobia, unspecified: Secondary | ICD-10-CM | POA: Diagnosis not present

## 2015-07-19 DIAGNOSIS — J301 Allergic rhinitis due to pollen: Secondary | ICD-10-CM | POA: Insufficient documentation

## 2015-07-19 DIAGNOSIS — F429 Obsessive-compulsive disorder, unspecified: Secondary | ICD-10-CM | POA: Diagnosis not present

## 2015-07-19 DIAGNOSIS — M5416 Radiculopathy, lumbar region: Secondary | ICD-10-CM

## 2015-07-19 DIAGNOSIS — R69 Illness, unspecified: Secondary | ICD-10-CM | POA: Diagnosis not present

## 2015-07-19 MED ORDER — SYNTHROID 137 MCG PO TABS: 137.0000 ug | ORAL_TABLET | Freq: Every day | ORAL | Status: DC

## 2015-07-19 MED ORDER — ROSUVASTATIN CALCIUM 10 MG PO TABS: 10.0000 mg | ORAL_TABLET | Freq: Every day | ORAL | Status: DC

## 2015-07-19 NOTE — Progress Notes (Signed)
Patient ID: Andrea [name], female   DOB: Feb 15, 1976, 40 y.o.   MRN: [id number]    Location:    [facility]   Place of Service:  OFFICE   Chief Complaint  Patient presents with  . Medical Management of Chronic Issues    4 month follow-up  . OTHER    Pateint c/o Patient says she had chest pain and shortnes of breath two days ago     HPI:  40 yo female seen today for f/u. She had CP 2 nights ago with sensation of someone sitting on her chest. Tried tums without relief. Also tried deep breathing exercises without relief. She did not seek medical treatment.  Did not feel like a panic attack. Last ECG in April 2016  She is c/a occasional mouth ulcers that "take forever" to heal. She is former smoker  Hypothyroid - borderline controlled on brand synthroid. TSH 4.610  BP stable at home on lasix, losartan and lopressor. She takes ASA daily  Hyperlipidemia - stale on zetia/crestor. LDL 126. TG 348  Vit D defic - improved on supplement. Vit D19.2  Allergic rhinitis - stable on singulair  Diarrhea/GERD/IBS - takes [place]. Acid reflux stable on nexium. Takes prn zofran  She sees psych for OCD/social anxiety/depression. Takes sertraline, lamictal, clonazepam, wellbutrin sr and alprazolam.  She has an autistic son.  She recently discovered that her ex husband is IVDA and shared needles with Hep C and HIV (+) individual. Last sexual contact in 2010. [facility]  She is a poor historian due to psych dx. Hx obtained from chart  Past Medical History  Diagnosis Date  . Hypertension   . MVP (mitral valve prolapse)     echo per dr Dagmar Hait  . Anxiety   . Depression   . Headache(784.0)   . Protein S deficiency ([place]) 2003    dvt/pulmonary embolus-on coumadin for 6 months then off-Sees [facility]  . Arthritis     ruptured lumbar disc-careful with positioning  . Hypothyroidism   . Blood dyscrasia     protein s deficiency-no treatment since 2006  .  Hyperlipemia   . IBS (irritable bowel syndrome)   . GERD (gastroesophageal reflux disease)   . Hemorrhoids   . Pulmonary embolism ([place])   . PVC (premature ventricular contraction)   . High cholesterol     Past Surgical History  Procedure Laterality Date  . Vaginal reconstructive surgery  2001  . Cesarean section  2006  . Tonsillectomy  89  . Laparoscopic assisted vaginal hysterectomy  03/27/2011    Procedure: LAPAROSCOPIC ASSISTED VAGINAL HYSTERECTOMY;  Surgeon: Cyril Mourning, MD;  Location: [facility];  Service: Gynecology;  Laterality: N/A;  . Salpingoophorectomy  03/27/2011    Procedure: SALPINGO OOPHERECTOMY;  Surgeon: Cyril Mourning, MD;  Location: [facility];  Service: Gynecology;  Laterality: Bilateral;  . Cholecystectomy N/A 11/03/2014    Procedure: LAPAROSCOPIC CHOLECYSTECTOMY WITH INTRAOPERATIVE CHOLANGIOGRAM;  Surgeon: Georganna Skeans, MD;  Location: [facility];  Service: General;  Laterality: N/A;    Patient Care Team: Gildardo Cranker, DO as PCP - General (Internal Medicine) Elsie Stain, MD as Attending Physician (Pulmonary Disease) Irene Shipper, MD as Consulting Physician (Gastroenterology) Chucky May, MD as Consulting Physician (Psychiatry) Adrian Prows, MD as Consulting Physician (Cardiology) Dian Queen, MD as Consulting Physician (Obstetrics and Gynecology) Nicholaus Bloom, MD (Anesthesiology)  Social History   Social History  . Marital Status: Divorced    Spouse Name: N/A  .  Number of Children: 2  . 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 Smoking status: Former Smoker -- 1.00 packs/day for 15 years    Types: Cigarettes    Quit date: 03/18/2004  . Smokeless tobacco: Never Used  . Alcohol Use: Yes     Comment: rarely  . Drug Use: No  . Sexual Activity: Not on file   Other Topics Concern  . Not on file   Social History Narrative   Diet: Regular   Caffeine: Yes   Married-  divorced, married in 2006   House: Yes, 2 persons   Pets: 1 dog   Current/Past profession: [employer] 2002-2013   Exercise: Yes, walking   Living Will: No    DNR: No    POA/HPOA: No            reports that she quit smoking about 11 years ago. Her smoking use included Cigarettes. She has a 15 pack-year smoking history. She has never used smokeless tobacco. She reports that she drinks alcohol. She reports that she does not use illicit drugs.  No Known Allergies  Medications: Patient's Medications  New Prescriptions   No medications on file  Previous Medications   ALPRAZOLAM (XANAX) 1 MG TABLET    Take 1 mg by mouth at bedtime as needed.   ASPIRIN EC 81 MG TABLET    Take 81 mg by mouth daily.   BUPROPION (WELLBUTRIN SR) 150 MG 12 HR TABLET    Take 150 mg by mouth daily.   CALCIUM CARBONATE (OS-CAL) 600 MG TABS    Take 600 mg by mouth daily.   CHOLESTYRAMINE (QUESTRAN) 4 G PACKET    Take 1 packet (4 g total) by mouth 2 (two) times daily.   CLONAZEPAM (KLONOPIN) 1 MG TABLET    Take 1 mg by mouth 4 (four) times daily.   COENZYME Q10 (COQ10) 100 MG CAPS    Take 100 mg by mouth daily.    ESOMEPRAZOLE (NEXIUM) 40 MG CAPSULE    Take 1 capsule (40 mg total) by mouth daily.   EZETIMIBE (ZETIA) 10 MG TABLET    Take 1 tablet (10 mg total) by mouth daily.   FUROSEMIDE (LASIX) 20 MG TABLET    Take 20 mg by mouth daily as needed for fluid.    LAMOTRIGINE (LAMICTAL) 150 MG TABLET    Take 150 mg by mouth daily.   LOPERAMIDE (IMODIUM) 2 MG CAPSULE    Take by mouth as needed for diarrhea or loose stools.   LOSARTAN (COZAAR) 100 MG TABLET    Take 1 tablet (100 mg total) by mouth daily.   METOPROLOL TARTRATE (LOPRESSOR) 25 MG TABLET    TAKE 1 TABLET BY MOUTH TWICE DAILY   MONTELUKAST (SINGULAIR) 10 MG TABLET    Take 1 tablet (10 mg total) by mouth at bedtime.   MULTIPLE VITAMINS-MINERALS (MULTIVITAMIN WITH MINERALS) TABLET    Take 1 tablet by mouth daily.    ONDANSETRON (ZOFRAN) 4 MG TABLET    Take 1  tablet (4 mg total) by mouth every 8 (eight) hours as needed for nausea or vomiting.   OXYCODONE-ACETAMINOPHEN (PERCOCET/ROXICET) 5-325 MG PER TABLET    Take 1 tablet by mouth every 6 (six) hours as needed for moderate pain or severe pain.   ROSUVASTATIN (CRESTOR) 10 MG TABLET    Take 1 tablet (10 mg total) by mouth daily.   SERTRALINE (ZOLOFT)  100 MG TABLET    Take 100 mg by mouth daily.   SYNTHROID 125 MCG TABLET    TAKE 1 BY MOUTH EVERY DAY WITH WATER ON EMPTY STOMACH   TOPIRAMATE (TOPAMAX) 50 MG TABLET    Take 50 mg by mouth at bedtime.    VITAMIN C (ASCORBIC ACID) 500 MG TABLET    Take 500 mg by mouth daily.   VITAMIN D, ERGOCALCIFEROL, (DRISDOL) 50000 UNITS CAPS CAPSULE    Take 1 capsule (50,000 Units total) by mouth every 7 (seven) days. tuesdays   VITAMIN E 400 UNIT CAPSULE    Take 400 Units by mouth daily.  Modified Medications   No medications on file  Discontinued Medications   No medications on file    Review of Systems  Unable to perform ROS: Psychiatric disorder    Filed Vitals:   07/19/15 0822  BP: 130/96  Pulse: 70  Temp: 98.1 F (36.7 C)  TempSrc: Oral  Resp: 18  Height: 5\' 9"  (1.753 m)  Weight: 245 lb 9.6 oz (111.403 kg)  SpO2: 98%   Body mass index is 36.25 kg/(m^2).  Physical Exam  Constitutional: She appears well-developed and well-nourished.  HENT:  Mouth/Throat: Oropharynx is clear and moist. No oropharyngeal exudate.  No ulcerations/lesions in mouth  Eyes: Pupils are equal, round, and reactive to light. No scleral icterus.  Neck: Neck supple. Carotid bruit is not present. No tracheal deviation present. Thyromegaly (left > right) present.  Cardiovascular: Normal rate, regular rhythm, normal heart sounds and intact distal pulses.  Exam reveals no gallop and no friction rub.   No murmur heard. No LE edema b/l. no calf TTP.   Pulmonary/Chest: Effort normal and breath sounds normal. No stridor. No respiratory distress. She has no wheezes. She has no  rales.  CP not reproducible  Abdominal: Soft. Bowel sounds are normal. She exhibits no distension and no mass. There is no hepatomegaly. There is no tenderness. There is no rebound and no guarding.  Musculoskeletal: She exhibits edema and tenderness (lumbar muscles).  Lymphadenopathy:    She has no cervical adenopathy.  Neurological: She is alert. She has normal reflexes.  Skin: Skin is warm and dry. No rash noted.  Psychiatric: Her behavior is normal. Her mood appears anxious. Her speech is rapid and/or pressured.     Labs reviewed: Lab on 06/26/2015  Component Date Value Ref Range Status  . Glucose 06/26/2015 104* 65 - 99 mg/dL Final  . BUN 06/26/2015 12  6 - 24 mg/dL Final  . Creatinine, Ser 06/26/2015 0.93  0.57 - 1.00 mg/dL Final  . GFR calc non Af Amer 06/26/2015 77  >59 mL/min/1.73 Final  . GFR calc Af Amer 06/26/2015 88  >59 mL/min/1.73 Final  . BUN/Creatinine Ratio 06/26/2015 13  9 - 23 Final  . Sodium 06/26/2015 139  134 - 144 mmol/L Final  . Potassium 06/26/2015 4.4  3.5 - 5.2 mmol/L Final  . Chloride 06/26/2015 98  96 - 106 mmol/L Final  . CO2 06/26/2015 25  18 - 29 mmol/L Final  . Calcium 06/26/2015 9.5  8.7 - 10.2 mg/dL Final  . Total Protein 06/26/2015 6.6  6.0 - 8.5 g/dL Final  . Albumin 06/26/2015 4.3  3.5 - 5.5 g/dL Final  . Globulin, Total 06/26/2015 2.3  1.5 - 4.5 g/dL Final  . Albumin/Globulin Ratio 06/26/2015 1.9  1.1 - 2.5 Final  . Bilirubin Total 06/26/2015 0.5  0.0 - 1.2 mg/dL Final  . Alkaline Phosphatase 06/26/2015 102  39 - 117 IU/L Final  . AST 06/26/2015 23  0 - 40 IU/L Final  . ALT 06/26/2015 40* 0 - 32 IU/L Final  . Cholesterol, Total 06/26/2015 238* 100 - 199 mg/dL Final  . Triglycerides 06/26/2015 348* 0 - 149 mg/dL Final  . HDL 06/26/2015 42  >39 mg/dL Final  . VLDL Cholesterol Cal 06/26/2015 70* 5 - 40 mg/dL Final  . LDL Calculated 06/26/2015 126* 0 - 99 mg/dL Final  . Chol/HDL Ratio 06/26/2015 5.7* 0.0 - 4.4 ratio units Final   Comment:                                    T. Chol/HDL Ratio                                             Men  Women                               1/2 Avg.Risk  3.4    3.3                                   Avg.Risk  5.0    4.4                                2X Avg.Risk  9.6    7.1                                3X Avg.Risk 23.4   11.0   . Free T4 06/26/2015 1.32  0.82 - 1.77 ng/dL Final  . Vit D, 25-Hydroxy 06/26/2015 19.2* 30.0 - 100.0 ng/mL Final   Comment: Vitamin D deficiency has been defined by the [place] practice guideline as a level of serum 25-OH vitamin D less than 20 ng/mL (1,2). The Endocrine Society went on to further define vitamin D insufficiency as a level between 21 and 29 ng/mL (2). 1. IOM (Institute of Medicine). 2010. Dietary reference    intakes for calcium and D. [place]: The    [employer]. 2. Holick MF, Binkley [place], Bischoff-Ferrari HA, et al.    Evaluation, treatment, and prevention of vitamin D    deficiency: an Endocrine Society clinical practice    guideline. JCEM. 2011 Jul; 96(7):1911-30.   Marland Kitchen Specific Gravity, UA 06/26/2015      >=1.030* 1.005 - 1.030 Final  . pH, UA 06/26/2015 5.5  5.0 - 7.5 Final  . Color, UA 06/26/2015 Yellow  Yellow Final  . Appearance Ur 06/26/2015 Cloudy* Clear Final  . Leukocytes, UA 06/26/2015 Negative  Negative Final  . Protein, UA 06/26/2015 Negative  Negative/Trace Final  . Glucose, UA 06/26/2015 Negative  Negative Final  . Ketones, UA 06/26/2015 Negative  Negative Final  . RBC, UA 06/26/2015 Negative  Negative Final  . Bilirubin, UA 06/26/2015 Negative  Negative Final  . Urobilinogen, Ur 06/26/2015 0.2  0.2 - 1.0 mg/dL Final  . Nitrite, UA 06/26/2015 Negative  Negative Final  . Microscopic Examination 06/26/2015 Comment  Final   Microscopic not indicated and not performed.  Marland Kitchen TSH 06/26/2015 4.610* 0.450 - 4.500 uIU/mL Final  . WBC 06/26/2015 6.5  3.4 - 10.8 x10E3/uL Final  . RBC  06/26/2015 4.56  3.77 - 5.28 x10E6/uL Final  . Hemoglobin 06/26/2015 13.5  11.1 - 15.9 g/dL Final  . Hematocrit 06/26/2015 40.5  34.0 - 46.6 % Final  . MCV 06/26/2015 89  79 - 97 fL Final  . MCH 06/26/2015 29.6  26.6 - 33.0 pg Final  . MCHC 06/26/2015 33.3  31.5 - 35.7 g/dL Final  . RDW 06/26/2015 13.2  12.3 - 15.4 % Final  . Platelets 06/26/2015 242  150 - 379 x10E3/uL Final  . Neutrophils 06/26/2015 54   Final  . Lymphs 06/26/2015 38   Final  . Monocytes 06/26/2015 5   Final  . Eos 06/26/2015 3   Final  . Basos 06/26/2015 0   Final  . Neutrophils Absolute 06/26/2015 3.5  1.4 - 7.0 x10E3/uL Final  . Lymphocytes Absolute 06/26/2015 2.5  0.7 - 3.1 x10E3/uL Final  . Monocytes Absolute 06/26/2015 0.3  0.1 - 0.9 x10E3/uL Final  . EOS (ABSOLUTE) 06/26/2015 0.2  0.0 - 0.4 x10E3/uL Final  . Basophils Absolute 06/26/2015 0.0  0.0 - 0.2 x10E3/uL Final  . Immature Granulocytes 06/26/2015 0   Final  . Immature Grans (Abs) 06/26/2015 0.0  0.0 - 0.1 x10E3/uL Final    No results found. ECG OBTAINED AND REVIEWED BY MYSELF: SB @ 54 BPM, nml axis, poor R wave progression. No acute ischemic changes. No change since 08/2014  Assessment/Plan   ICD-9-CM ICD-10-CM   1. Hypothyroidism due to acquired atrophy of thyroid 244.8 E03.8 TSH   246.8 E03.4   2. Hyperlipidemia LDL goal <100 272.4 E78.5 rosuvastatin (CRESTOR) 10 MG tablet  3. Essential hypertension 401.9 I10   4. Social anxiety disorder 300.23 F40.10   5. Major depressive disorder, recurrent episode, moderate (HCC) 296.32 F33.1   6. Gastroesophageal reflux disease, esophagitis presence not specified 530.81 K21.9   7. Chronic lumbar radiculopathy 724.4 M54.16   8. Vitamin D deficiency 268.9 E55.9   9. OCD (obsessive compulsive disorder) 300.3 F42.9   10. History of exposure to infectious disease V45.89 Z20.9 Hepatitis panel, acute  11. Chest pain, unspecified chest pain type probably due to psych d/o 786.50 R07.9    Will send letter with lab  results  Increase synthroid to 128mcg daily  Continue other medications as ordered  Follow up in 6 weeks for repeat thyroid lab  Keep appt with specialists as scheduled  Follow up in 4 mos for routine visit   Kanisha Duba S. Perlie Gold  [facility] [address] [place], [place] [address] [phone] Cell (Monday-Friday 8 AM - 5 PM) [phone] After 5 PM and follow prompts

## 2015-07-19 NOTE — Patient Instructions (Signed)
Will send letter with lab results  Continue current medications as ordered  Follow up in 6 weeks for repeat thyroid lab  Keep appt with specialists as scheduled  Follow up in 4 mos for routine visit

## 2015-07-20 LAB — HEPATITIS PANEL, ACUTE
Hep A IgM: NEGATIVE
Hep B C IgM: NEGATIVE
Hep C Virus Ab: <0.1 s/co ratio (ref 0.0–0.9)
Hepatitis B Surface Ag: NEGATIVE

## 2015-07-28 DIAGNOSIS — G894 Chronic pain syndrome: Secondary | ICD-10-CM | POA: Diagnosis not present

## 2015-07-28 DIAGNOSIS — Z029 Encounter for administrative examinations, unspecified: Secondary | ICD-10-CM

## 2015-07-28 DIAGNOSIS — M5416 Radiculopathy, lumbar region: Secondary | ICD-10-CM | POA: Diagnosis not present

## 2015-07-28 DIAGNOSIS — Z79891 Long term (current) use of opiate analgesic: Secondary | ICD-10-CM | POA: Diagnosis not present

## 2015-07-28 DIAGNOSIS — M4726 Other spondylosis with radiculopathy, lumbar region: Secondary | ICD-10-CM | POA: Diagnosis not present

## 2015-08-10 DIAGNOSIS — Z79891 Long term (current) use of opiate analgesic: Secondary | ICD-10-CM | POA: Diagnosis not present

## 2015-08-10 DIAGNOSIS — M5416 Radiculopathy, lumbar region: Secondary | ICD-10-CM | POA: Diagnosis not present

## 2015-08-10 DIAGNOSIS — M4726 Other spondylosis with radiculopathy, lumbar region: Secondary | ICD-10-CM | POA: Diagnosis not present

## 2015-08-10 DIAGNOSIS — G894 Chronic pain syndrome: Secondary | ICD-10-CM | POA: Diagnosis not present

## 2015-08-30 ENCOUNTER — Other Ambulatory Visit: Payer: Medicare HMO

## 2015-08-30 DIAGNOSIS — E038 Other specified hypothyroidism: Secondary | ICD-10-CM | POA: Diagnosis not present

## 2015-08-30 DIAGNOSIS — E034 Atrophy of thyroid (acquired): Secondary | ICD-10-CM

## 2015-08-31 LAB — TSH: TSH: 14.4 u[IU]/mL — ABNORMAL HIGH (ref 0.450–4.500)

## 2015-09-04 DIAGNOSIS — Z79891 Long term (current) use of opiate analgesic: Secondary | ICD-10-CM | POA: Diagnosis not present

## 2015-09-04 DIAGNOSIS — G894 Chronic pain syndrome: Secondary | ICD-10-CM | POA: Diagnosis not present

## 2015-09-04 DIAGNOSIS — M5416 Radiculopathy, lumbar region: Secondary | ICD-10-CM | POA: Diagnosis not present

## 2015-09-04 DIAGNOSIS — M4726 Other spondylosis with radiculopathy, lumbar region: Secondary | ICD-10-CM | POA: Diagnosis not present

## 2015-09-05 ENCOUNTER — Other Ambulatory Visit: Payer: Self-pay

## 2015-09-05 ENCOUNTER — Other Ambulatory Visit: Payer: Self-pay | Admitting: *Deleted

## 2015-09-05 ENCOUNTER — Telehealth: Payer: Self-pay

## 2015-09-05 DIAGNOSIS — E039 Hypothyroidism, unspecified: Secondary | ICD-10-CM

## 2015-09-05 DIAGNOSIS — I10 Essential (primary) hypertension: Secondary | ICD-10-CM

## 2015-09-05 DIAGNOSIS — E034 Atrophy of thyroid (acquired): Secondary | ICD-10-CM

## 2015-09-05 MED ORDER — LEVOTHYROXINE SODIUM 150 MCG PO TABS: 150.0000 ug | ORAL_TABLET | Freq: Every day | ORAL | Status: DC

## 2015-09-05 MED ORDER — SYNTHROID 137 MCG PO TABS: 137.0000 ug | ORAL_TABLET | Freq: Every day | ORAL | Status: DC

## 2015-09-05 NOTE — Telephone Encounter (Signed)
Change med to brand only synthroid

## 2015-09-05 NOTE — Telephone Encounter (Signed)
Patient called the office to ask if her current Thyroid medication was what you still wanted her to take

## 2015-09-05 NOTE — Telephone Encounter (Signed)
Just spoke with patient about her medication change and what was to be taken she said she cannot take the generic  brand levothyroxine she must take the brand name synthroid. Please Advise

## 2015-09-17 ENCOUNTER — Other Ambulatory Visit: Payer: Self-pay | Admitting: Internal Medicine

## 2015-10-18 ENCOUNTER — Other Ambulatory Visit: Payer: Self-pay

## 2015-10-18 ENCOUNTER — Ambulatory Visit: Payer: Self-pay | Admitting: Internal Medicine

## 2015-10-20 ENCOUNTER — Other Ambulatory Visit: Payer: Self-pay | Admitting: Internal Medicine

## 2015-10-27 ENCOUNTER — Ambulatory Visit (INDEPENDENT_AMBULATORY_CARE_PROVIDER_SITE_OTHER): Payer: Medicare HMO | Admitting: Internal Medicine

## 2015-10-27 ENCOUNTER — Encounter: Payer: Self-pay | Admitting: Internal Medicine

## 2015-10-27 VITALS — BP 128/92 | HR 70 | Temp 98.9°F | Ht 69.0 in | Wt 251.2 lb

## 2015-10-27 DIAGNOSIS — E034 Atrophy of thyroid (acquired): Secondary | ICD-10-CM

## 2015-10-27 DIAGNOSIS — R631 Polydipsia: Secondary | ICD-10-CM | POA: Diagnosis not present

## 2015-10-27 DIAGNOSIS — M5416 Radiculopathy, lumbar region: Secondary | ICD-10-CM

## 2015-10-27 DIAGNOSIS — R350 Frequency of micturition: Secondary | ICD-10-CM | POA: Diagnosis not present

## 2015-10-27 DIAGNOSIS — F429 Obsessive-compulsive disorder, unspecified: Secondary | ICD-10-CM

## 2015-10-27 DIAGNOSIS — E038 Other specified hypothyroidism: Secondary | ICD-10-CM | POA: Diagnosis not present

## 2015-10-27 DIAGNOSIS — R69 Illness, unspecified: Secondary | ICD-10-CM | POA: Diagnosis not present

## 2015-10-27 DIAGNOSIS — G629 Polyneuropathy, unspecified: Secondary | ICD-10-CM

## 2015-10-27 DIAGNOSIS — F331 Major depressive disorder, recurrent, moderate: Secondary | ICD-10-CM | POA: Diagnosis not present

## 2015-10-27 MED ORDER — ONDANSETRON HCL 4 MG PO TABS: 4.0000 mg | ORAL_TABLET | Freq: Three times a day (TID) | ORAL | Status: DC | PRN

## 2015-10-27 NOTE — Progress Notes (Signed)
Patient ID: Andrea [name], female   DOB: [birth year], 40 y.o.   MRN: [id number]    Location:  [facility] Place of Service: OFFICE  Chief Complaint  Patient presents with  . Polydipsia    frequent urination, patient states she drinks constantly and still have a painful thirst, feels like she has pins pricking in her feet    HPI:  40 yo female seen today for an acute visit. She c/o polydipsia x 3 weeks, numbness/tingling in feet, nocturia, increased urinary frequency. No hx DM. No FHx DM. Last lab showed glucose of 104. She also has SOB and CP x 2 mos. She has dry cough not relieved with antihistamine daily. No polyphagia. She has swelling in her feet. She takes lasix. She also reports "unusual" body odor, choking sensation, dysuria at urethra only, nausea (improveswith daily zofran), diarrhea, LE swelling, numbness in groin and inability to lose weight. She is a poor historian due to psych d/o. Hx obtained from chart. Her young son is present. She is followed by psych for MDD, OCD and anxiety.  Past Medical History  Diagnosis Date  . Hypertension   . MVP (mitral valve prolapse)     echo per dr Dagmar Hait  . Anxiety   . Depression   . Headache(784.0)   . Protein S deficiency ([place]) 2003    dvt/pulmonary embolus-on coumadin for 6 months then off-Sees [facility]  . Arthritis     ruptured lumbar disc-careful with positioning  . Hypothyroidism   . Blood dyscrasia     protein s deficiency-no treatment since 2006  . Hyperlipemia   . IBS (irritable bowel syndrome)   . GERD (gastroesophageal reflux disease)   . Hemorrhoids   . Pulmonary embolism ([place])   . PVC (premature ventricular contraction)   . High cholesterol     Past Surgical History  Procedure Laterality Date  . Vaginal reconstructive surgery  2001  . Cesarean section  2006  . Tonsillectomy  92  . Laparoscopic assisted vaginal hysterectomy  03/27/2011    Procedure: LAPAROSCOPIC ASSISTED VAGINAL HYSTERECTOMY;  Surgeon: Cyril Mourning, MD;  Location: [facility];  Service: Gynecology;  Laterality: N/A;  . Salpingoophorectomy  03/27/2011    Procedure: SALPINGO OOPHERECTOMY;  Surgeon: Cyril Mourning, MD;  Location: [facility];  Service: Gynecology;  Laterality: Bilateral;  . Cholecystectomy N/A 11/03/2014    Procedure: LAPAROSCOPIC CHOLECYSTECTOMY WITH INTRAOPERATIVE CHOLANGIOGRAM;  Surgeon: Georganna Skeans, MD;  Location: [facility];  Service: General;  Laterality: N/A;    Patient Care Team: Gildardo Cranker, DO as PCP - General (Internal Medicine) Elsie Stain, MD as Attending Physician (Pulmonary Disease) Irene Shipper, MD as Consulting Physician (Gastroenterology) Chucky May, MD as Consulting Physician (Psychiatry) Adrian Prows, MD as Consulting Physician (Cardiology) Dian Queen, MD as Consulting Physician (Obstetrics and Gynecology) Nicholaus Bloom, MD (Anesthesiology)  Social History   Social History  . Marital Status: Divorced    Spouse Name: N/A  . Number of Children: 2  . 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 Smoking status: Former Smoker -- 1.00 packs/day for 15 years    Types: Cigarettes    Quit date: 03/18/2004  . Smokeless tobacco: Never Used  . Alcohol Use: Yes     Comment: rarely  . Drug Use: No  . Sexual Activity: Not on file   Other Topics Concern  . Not on file   Social History Narrative  Diet: Regular   Caffeine: Yes   Married- divorced, married in 2006   House: Yes, 2 persons   Pets: 1 dog   Current/Past profession: [employer] 2002-2013   Exercise: Yes, walking   Living Will: No    DNR: No    POA/HPOA: No            reports that she quit smoking about 11 years ago. Her smoking use included Cigarettes. She has a 15 pack-year smoking history. She has never used smokeless tobacco. She reports that she drinks alcohol. She reports that she does not use illicit drugs.  Family History  Problem Relation  Age of Onset  . Hypertension Other     entire family on both sides  . Hyperlipidemia Father     fathers side of family  . Asthma Son   . Asthma Son   . Clotting disorder Maternal Uncle   . Clotting disorder Paternal Grandmother   . Heart disease Maternal Grandfather   . High blood pressure Father   . High blood pressure Mother    Family Status  Relation Status Death Age  . Mother Alive   . Father Alive   . Sister Alive   . Son Alive   . Son Alive      No Known Allergies  Medications: Patient's Medications  New Prescriptions   No medications on file  Previous Medications   ALPRAZOLAM (XANAX) 1 MG TABLET    Take 1 mg by mouth at bedtime as needed.   ASPIRIN EC 81 MG TABLET    Take 81 mg by mouth daily.   BUPROPION (WELLBUTRIN SR) 150 MG 12 HR TABLET    Take 150 mg by mouth daily.   CALCIUM CARBONATE (OS-CAL) 600 MG TABS    Take 600 mg by mouth daily.   CHOLESTYRAMINE (QUESTRAN) 4 G PACKET    Take 1 packet (4 g total) by mouth 2 (two) times daily.   CLONAZEPAM (KLONOPIN) 1 MG TABLET    Take 1 mg by mouth 4 (four) times daily.   COENZYME Q10 (COQ10) 100 MG CAPS    Take 100 mg by mouth daily.    ESOMEPRAZOLE (NEXIUM) 40 MG CAPSULE    Take 1 capsule (40 mg total) by mouth daily.   FUROSEMIDE (LASIX) 20 MG TABLET    Take 20 mg by mouth daily as needed for fluid.    LAMOTRIGINE (LAMICTAL) 150 MG TABLET    Take 150 mg by mouth daily.   LEVOTHYROXINE (SYNTHROID) 150 MCG TABLET    Take 150 mcg by mouth daily before breakfast.   LEVOTHYROXINE (SYNTHROID, LEVOTHROID) 150 MCG TABLET    Take 1 tablet (150 mcg total) by mouth daily.   LOPERAMIDE (IMODIUM) 2 MG CAPSULE    Take by mouth as needed for diarrhea or loose stools.   LOSARTAN (COZAAR) 100 MG TABLET    TAKE 1 TABLET(100 MG) BY MOUTH DAILY   METOPROLOL TARTRATE (LOPRESSOR) 25 MG TABLET    TAKE 1 TABLET BY MOUTH TWICE DAILY   MONTELUKAST (SINGULAIR) 10 MG TABLET    Take 1 tablet (10 mg total) by mouth at bedtime.   MULTIPLE  VITAMINS-MINERALS (MULTIVITAMIN WITH MINERALS) TABLET    Take 1 tablet by mouth daily.    ONDANSETRON (ZOFRAN) 4 MG TABLET    Take 1 tablet (4 mg total) by mouth every 8 (eight) hours as needed for nausea or vomiting.   OXYCODONE-ACETAMINOPHEN (PERCOCET/ROXICET) 5-325 MG PER TABLET    Take 1  tablet by mouth every 6 (six) hours as needed for moderate pain or severe pain.   ROSUVASTATIN (CRESTOR) 10 MG TABLET    Take 1 tablet (10 mg total) by mouth daily.   SERTRALINE (ZOLOFT) 100 MG TABLET    Take 100 mg by mouth daily.   SYNTHROID 137 MCG TABLET    Take 1 tablet (137 mcg total) by mouth daily before breakfast.   TOPIRAMATE (TOPAMAX) 50 MG TABLET    Take 50 mg by mouth at bedtime.    VITAMIN C (ASCORBIC ACID) 500 MG TABLET    Take 500 mg by mouth daily.   VITAMIN D, ERGOCALCIFEROL, (DRISDOL) 50000 UNITS CAPS CAPSULE    Take 1 capsule (50,000 Units total) by mouth every 7 (seven) days. tuesdays   VITAMIN E 400 UNIT CAPSULE    Take 400 Units by mouth daily.   ZETIA 10 MG TABLET    TAKE 1 TABLET BY MOUTH EVERY DAY  Modified Medications   No medications on file  Discontinued Medications   No medications on file    Review of Systems  Unable to perform ROS: Psychiatric disorder    Filed Vitals:   10/27/15 1145  BP: 128/92  Pulse: 70  Temp: 98.9 F (37.2 C)  TempSrc: Oral  Height: 5\' 9"  (1.753 m)  Weight: 251 lb 3.2 oz (113.944 kg)  SpO2: 98%   Body mass index is 37.08 kg/(m^2).  Physical Exam  Constitutional: She appears well-developed and well-nourished.  HENT:  Mouth/Throat: Oropharynx is clear and moist. No oropharyngeal exudate.  No ulcerations/lesions in mouth  Eyes: Pupils are equal, round, and reactive to light. No scleral icterus.  Neck: Neck supple. Carotid bruit is not present. No tracheal deviation present. Thyromegaly (left > right) present.  Cardiovascular: Normal rate, regular rhythm, normal heart sounds and intact distal pulses.  Exam reveals no gallop and no friction  rub.   No murmur heard. No LE edema b/l. no calf TTP.   Pulmonary/Chest: Effort normal and breath sounds normal. No stridor. No respiratory distress. She has no wheezes. She has no rales.  CP not reproducible  Abdominal: Soft. Bowel sounds are normal. She exhibits no distension and no mass. There is no hepatomegaly. There is no tenderness. There is no rebound and no guarding.  Musculoskeletal: She exhibits edema and tenderness (lumbar muscles).  Lymphadenopathy:    She has no cervical adenopathy.  Neurological: She is alert. She has normal reflexes.  Skin: Skin is warm and dry. No rash noted.  Psychiatric: Her behavior is normal. Her mood appears anxious. Her speech is rapid and/or pressured.   Diabetic Foot Exam - Simple   Simple Foot Form  Diabetic Foot exam was performed with the following findings:  Yes 10/27/2015 12:32 PM  Visual Inspection  No deformities, no ulcerations, no other skin breakdown bilaterally:  Yes  Sensation Testing  See comments:  Yes  Pulse Check  Posterior Tibialis and Dorsalis pulse intact bilaterally:  Yes  Comments  Reduced monofilament testing R>L. No foot lesions       Labs reviewed: Appointment on 08/30/2015  Component Date Value Ref Range Status  . TSH 08/30/2015 14.400* 0.450 - 4.500 uIU/mL Final    No results found.   Assessment/Plan   ICD-9-CM ICD-10-CM   1. Polydipsia - new 783.5 R63.1 CMP     Hemoglobin A1c  2. Neuropathy (HCC) - new 355.9 G62.9 CMP     Hemoglobin A1c  3. Chronic lumbar radiculopathy 724.4 M54.16   4.  Hypothyroidism due to acquired atrophy of thyroid 244.8 E03.8 TSH   246.8 E03.4 T4, Free  5. Urinary frequency 788.41 R35.0 Urinalysis with Reflex Microscopic  6. OCD (obsessive compulsive disorder) 300.3 F42.9   7. Major depressive disorder, recurrent episode, moderate (HCC) 296.32 F33.1      Check CMP, A1c, UA with micro, TSH, free T4  Continue current medications as ordered  Follow up as scheduled  Follow up  with specialists as scheduled   Junah Yam S. Perlie Gold  [facility] [address] [place], [place] [address] [phone] Cell (Monday-Friday 8 AM - 5 PM) [phone] After 5 PM and follow prompts

## 2015-10-27 NOTE — Patient Instructions (Addendum)
Continue current medications as ordered  Will call with lab results  Follow up as scheduled  Follow up with specialists as scheduled

## 2015-10-28 LAB — URINALYSIS, ROUTINE W REFLEX MICROSCOPIC
Bilirubin, UA: NEGATIVE
Glucose, UA: NEGATIVE
Ketones, UA: NEGATIVE
Leukocytes, UA: NEGATIVE
Nitrite, UA: NEGATIVE
Protein, UA: NEGATIVE
RBC, UA: NEGATIVE
Specific Gravity, UA: 1.006 (ref 1.005–1.030)
Urobilinogen, Ur: 0.2 mg/dL (ref 0.2–1.0)
pH, UA: 7 (ref 5.0–7.5)

## 2015-10-28 LAB — HEMOGLOBIN A1C
Est. average glucose Bld gHb Est-mCnc: 120 mg/dL
Hgb A1c MFr Bld: 5.8 % — ABNORMAL HIGH (ref 4.8–5.6)

## 2015-10-28 LAB — COMPREHENSIVE METABOLIC PANEL
ALT: 54 IU/L — ABNORMAL HIGH (ref 0–32)
AST: 47 IU/L — ABNORMAL HIGH (ref 0–40)
Albumin/Globulin Ratio: 2 (ref 1.2–2.2)
Albumin: 4.6 g/dL (ref 3.5–5.5)
Alkaline Phosphatase: 91 IU/L (ref 39–117)
BUN/Creatinine Ratio: 16 (ref 9–23)
BUN: 12 mg/dL (ref 6–24)
Bilirubin Total: 0.4 mg/dL (ref 0.0–1.2)
CO2: 26 mmol/L (ref 18–29)
Calcium: 9.7 mg/dL (ref 8.7–10.2)
Chloride: 97 mmol/L (ref 96–106)
Creatinine, Ser: 0.77 mg/dL (ref 0.57–1.00)
GFR calc Af Amer: 111 mL/min/{1.73_m2} (ref >59)
GFR calc non Af Amer: 96 mL/min/{1.73_m2} (ref >59)
Globulin, Total: 2.3 g/dL (ref 1.5–4.5)
Glucose: 101 mg/dL — ABNORMAL HIGH (ref 65–99)
Potassium: 5.2 mmol/L (ref 3.5–5.2)
Sodium: 139 mmol/L (ref 134–144)
Total Protein: 6.9 g/dL (ref 6.0–8.5)

## 2015-10-28 LAB — TSH: TSH: 1.43 u[IU]/mL (ref 0.450–4.500)

## 2015-10-28 LAB — T4, FREE: Free T4: 1.5 ng/dL (ref 0.82–1.77)

## 2015-10-30 ENCOUNTER — Other Ambulatory Visit: Payer: Self-pay

## 2015-10-30 DIAGNOSIS — Z79891 Long term (current) use of opiate analgesic: Secondary | ICD-10-CM | POA: Diagnosis not present

## 2015-10-30 DIAGNOSIS — G894 Chronic pain syndrome: Secondary | ICD-10-CM | POA: Diagnosis not present

## 2015-10-30 DIAGNOSIS — R7989 Other specified abnormal findings of blood chemistry: Secondary | ICD-10-CM

## 2015-10-30 DIAGNOSIS — M5416 Radiculopathy, lumbar region: Secondary | ICD-10-CM | POA: Diagnosis not present

## 2015-10-30 DIAGNOSIS — M4726 Other spondylosis with radiculopathy, lumbar region: Secondary | ICD-10-CM | POA: Diagnosis not present

## 2015-10-30 DIAGNOSIS — R945 Abnormal results of liver function studies: Principal | ICD-10-CM

## 2015-10-30 NOTE — Telephone Encounter (Signed)
Opened in error. Please disregard.

## 2015-11-01 DIAGNOSIS — H5213 Myopia, bilateral: Secondary | ICD-10-CM | POA: Diagnosis not present

## 2015-11-01 DIAGNOSIS — Z0100 Encounter for examination of eyes and vision without abnormal findings: Secondary | ICD-10-CM | POA: Diagnosis not present

## 2015-11-06 ENCOUNTER — Telehealth: Payer: Self-pay | Admitting: *Deleted

## 2015-11-06 DIAGNOSIS — R739 Hyperglycemia, unspecified: Secondary | ICD-10-CM

## 2015-11-06 NOTE — Telephone Encounter (Signed)
Patient called and stated that she had some bloodwork concerns. Has liver scan scheduled for tomorrow. Patient stated that she would like to go to the [facility]. Would like for you to refer her. Please Advise.

## 2015-11-06 NOTE — Telephone Encounter (Signed)
Ok to refer as requested for hyperglycemia with A1c 5.8%

## 2015-11-07 ENCOUNTER — Ambulatory Visit (HOSPITAL_BASED_OUTPATIENT_CLINIC_OR_DEPARTMENT_OTHER)
Admission: RE | Admit: 2015-11-07 | Discharge: 2015-11-07 | Disposition: A | Discharge status: Home / Self Care | Payer: Medicare HMO | Source: Ambulatory Visit | Attending: Internal Medicine | Admitting: Internal Medicine

## 2015-11-07 DIAGNOSIS — K760 Fatty (change of) liver, not elsewhere classified: Secondary | ICD-10-CM | POA: Diagnosis not present

## 2015-11-07 DIAGNOSIS — R7989 Other specified abnormal findings of blood chemistry: Secondary | ICD-10-CM | POA: Diagnosis not present

## 2015-11-07 DIAGNOSIS — R945 Abnormal results of liver function studies: Secondary | ICD-10-CM

## 2015-11-08 NOTE — Telephone Encounter (Signed)
Order placed and patient aware.

## 2015-11-09 NOTE — Telephone Encounter (Signed)
Patient stated that she cannot go to [facility] due to the cost of $200 because her insurance doesn't cover.

## 2015-11-16 ENCOUNTER — Other Ambulatory Visit: Payer: Self-pay | Admitting: Internal Medicine

## 2015-11-20 ENCOUNTER — Telehealth: Payer: Self-pay

## 2015-11-20 DIAGNOSIS — R69 Illness, unspecified: Secondary | ICD-10-CM | POA: Diagnosis not present

## 2015-11-20 DIAGNOSIS — R7303 Prediabetes: Secondary | ICD-10-CM

## 2015-11-20 MED ORDER — ONETOUCH ULTRA SYSTEM W/DEVICE KIT: 1.0000 | PACK | Freq: Once | Status: DC

## 2015-11-20 MED ORDER — GLUCOSE BLOOD VI STRP: ORAL_STRIP | Status: DC

## 2015-11-20 MED ORDER — ONETOUCH DELICA LANCETS 33G MISC: Status: DC

## 2015-11-20 NOTE — Telephone Encounter (Signed)
Message left on triage voicemail: Patient was recently diagnosed with pre-diabetes. Patient would like to begin checking blood sugars. Patient needs rx for Onetouch Glucometer, test strips and lancets sent to her pharmacy.  Please advise if you agree with patient's request, please advise on how many times patient is to test blood sugar- daily, every other day, weekly? Please advise on reference range for patient with fasting BS and post meal range

## 2015-11-20 NOTE — Telephone Encounter (Signed)
[facility] for diabetic supplies. Not sure if insurance will cover cost. May check BS 3 times per week 2 hrs postprandial and record. Call if >200

## 2015-11-20 NOTE — Telephone Encounter (Signed)
Rx's called in, patient aware

## 2015-11-22 ENCOUNTER — Ambulatory Visit: Payer: Medicare HMO | Admitting: Internal Medicine

## 2015-11-22 DIAGNOSIS — R69 Illness, unspecified: Secondary | ICD-10-CM | POA: Diagnosis not present

## 2015-11-27 ENCOUNTER — Other Ambulatory Visit: Payer: Self-pay | Admitting: Internal Medicine

## 2015-12-13 ENCOUNTER — Other Ambulatory Visit: Payer: Self-pay | Admitting: *Deleted

## 2015-12-15 ENCOUNTER — Encounter: Payer: Self-pay | Admitting: Internal Medicine

## 2015-12-15 ENCOUNTER — Ambulatory Visit (INDEPENDENT_AMBULATORY_CARE_PROVIDER_SITE_OTHER): Payer: Medicare HMO | Admitting: Internal Medicine

## 2015-12-15 VITALS — BP 120/92 | HR 71 | Temp 97.3°F | Ht 69.0 in | Wt 241.6 lb

## 2015-12-15 DIAGNOSIS — R7303 Prediabetes: Secondary | ICD-10-CM | POA: Diagnosis not present

## 2015-12-15 DIAGNOSIS — R7989 Other specified abnormal findings of blood chemistry: Secondary | ICD-10-CM

## 2015-12-15 DIAGNOSIS — J301 Allergic rhinitis due to pollen: Secondary | ICD-10-CM | POA: Diagnosis not present

## 2015-12-15 DIAGNOSIS — E034 Atrophy of thyroid (acquired): Secondary | ICD-10-CM

## 2015-12-15 DIAGNOSIS — R945 Abnormal results of liver function studies: Secondary | ICD-10-CM

## 2015-12-15 DIAGNOSIS — E038 Other specified hypothyroidism: Secondary | ICD-10-CM

## 2015-12-15 DIAGNOSIS — E785 Hyperlipidemia, unspecified: Secondary | ICD-10-CM | POA: Diagnosis not present

## 2015-12-15 DIAGNOSIS — Z79899 Other long term (current) drug therapy: Secondary | ICD-10-CM

## 2015-12-15 NOTE — Progress Notes (Signed)
Patient ID: Andrea [name], female   DOB: 07-11-1975, 40 y.o.   MRN: [id number]    Location:  [facility] Place of Service: OFFICE  Chief Complaint  Patient presents with  . Medical Management of Chronic Issues    4 month follow up  . Other    Discuss [place], and prediabetes    HPI:  40 yo female seen today for f/u. She had difficulty finding a place on her finger that would bleed in order to check her BS. She wasted about 20 strips and is c/a whether she will have enough to last her until September. She continues to have polydipsia but numbness/tingling resolved. A1c 5.8%. She c/o "dizziness in my eyes" and a sensation of ice pick in left ear. She is a poor historian due to psych d/o. Hx obtained from chart  Obesity - she has lost 10 lbs since last OV. LFTs slightly elevated.  Hypothyroid - controlled on brand synthroid. TSH 1.43; free T4 1.5  BP stable at home on lasix, losartan and lopressor. She takes ASA daily  Hyperlipidemia - stale on zetia/crestor. LDL 126. TG 348  Vit D defic - improved on supplement. Vit D19.2  Allergic rhinitis - stable on prn singulair  Prediabetes - A1c 5.8%  Diarrhea/GERD/IBS - takes [place]. Acid reflux stable on nexium. Takes prn zofran  She sees psych for OCD/social anxiety/depression. Takes sertraline, lamictal, clonazepam, wellbutrin sr and alprazolam.  She has an autistic son.  She discovered that her ex husband is IVDA and shared needles with Hep C and HIV (+) individual. Last sexual contact in 2010. [facility]. Neg Hepatitis panel. RUQ US showed fatty liver  She is a poor historian due to psych dx. Hx obtained from chart   Past Medical History:  Diagnosis Date  . Anxiety   . Arthritis    ruptured lumbar disc-careful with positioning  . Blood dyscrasia    protein s deficiency-no treatment since 2006  . Depression   . GERD (gastroesophageal reflux disease)   . Headache(784.0)   . Hemorrhoids   . High  cholesterol   . Hyperlipemia   . Hypertension   . Hypothyroidism   . IBS (irritable bowel syndrome)   . MVP (mitral valve prolapse)    echo per dr Dagmar Hait  . Protein S deficiency ([place]) 2003   dvt/pulmonary embolus-on coumadin for 6 months then off-Sees [facility]  . Pulmonary embolism ([place])   . PVC (premature ventricular contraction)     Past Surgical History:  Procedure Laterality Date  . CESAREAN SECTION  2006  . CHOLECYSTECTOMY N/A 11/03/2014   Procedure: LAPAROSCOPIC CHOLECYSTECTOMY WITH INTRAOPERATIVE CHOLANGIOGRAM;  Surgeon: Georganna Skeans, MD;  Location: [facility];  Service: General;  Laterality: N/A;  . LAPAROSCOPIC ASSISTED VAGINAL HYSTERECTOMY  03/27/2011   Procedure: LAPAROSCOPIC ASSISTED VAGINAL HYSTERECTOMY;  Surgeon: Cyril Mourning, MD;  Location: [facility];  Service: Gynecology;  Laterality: N/A;  . SALPINGOOPHORECTOMY  03/27/2011   Procedure: SALPINGO OOPHERECTOMY;  Surgeon: Cyril Mourning, MD;  Location: [facility];  Service: Gynecology;  Laterality: Bilateral;  . TONSILLECTOMY  92  . vaginal reconstructive surgery  2001    Patient Care Team: Gildardo Cranker, DO as PCP - General (Internal Medicine) Elsie Stain, MD as Attending Physician (Pulmonary Disease) Irene Shipper, MD as Consulting Physician (Gastroenterology) Chucky May, MD as Consulting Physician (Psychiatry) Adrian Prows, MD as Consulting Physician (Cardiology) Dian Queen, MD as Consulting Physician (Obstetrics and Gynecology) Nicholaus Bloom, MD (  Anesthesiology)  Social History   Social History  . Marital status: Divorced    Spouse name: N/A  . Number of children: 2  . 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 Smoking status: Former Smoker    Packs/day: 1.00    Years: 15.00    Types: Cigarettes    Quit date: 03/18/2004  . Smokeless tobacco: Never Used  . Alcohol use Yes     Comment: rarely  . Drug use: No  .  Sexual activity: Not on file   Other Topics Concern  . Not on file   Social History Narrative   Diet: Regular   Caffeine: Yes   Married- divorced, married in 2006   House: Yes, 2 persons   Pets: 1 dog   Current/Past profession: [employer] 2002-2013   Exercise: Yes, walking   Living Will: No    DNR: No    POA/HPOA: No            reports that she quit smoking about 11 years ago. Her smoking use included Cigarettes. She has a 15.00 pack-year smoking history. She has never used smokeless tobacco. She reports that she drinks alcohol. She reports that she does not use drugs.  Family History  Problem Relation Age of Onset  . High blood pressure Mother   . Hyperlipidemia Father     fathers side of family  . High blood pressure Father   . Hypertension Other     entire family on both sides  . Asthma Son   . Asthma Son   . Clotting disorder Maternal Uncle   . Clotting disorder Paternal Grandmother   . Heart disease Maternal Grandfather    Family Status  Relation Status  . Mother Alive  . Father Alive  . Sister Alive  . Son Alive  . Son Alive  . Other   . Son   . Son   . Maternal Uncle   . Paternal Grandmother   . Maternal Grandfather      No Known Allergies  Medications: Patient's Medications  New Prescriptions   No medications on file  Previous Medications   ALPRAZOLAM (XANAX) 1 MG TABLET    Take 1 mg by mouth at bedtime as needed.   ASPIRIN EC 81 MG TABLET    Take 81 mg by mouth daily.   BLOOD GLUCOSE MONITORING SUPPL (ONE TOUCH ULTRA SYSTEM KIT) W/DEVICE KIT    1 kit by Does not apply route once. Check blood sugar three times a week 2 hours after meals, call is BS >200 DX R73.03   BUPROPION (WELLBUTRIN SR) 150 MG 12 HR TABLET    Take 150 mg by mouth daily.   CALCIUM CARBONATE (OS-CAL) 600 MG TABS    Take 600 mg by mouth daily.   CHOLESTYRAMINE (QUESTRAN) 4 G PACKET    Take 1 packet (4 g total) by mouth 2 (two) times daily.   CLONAZEPAM (KLONOPIN) 1 MG TABLET     Take 1 mg by mouth 4 (four) times daily.   COENZYME Q10 (COQ10) 100 MG CAPS    Take 100 mg by mouth daily.    ESOMEPRAZOLE (NEXIUM) 40 MG CAPSULE    Take 1 capsule (40 mg total) by mouth daily.   FUROSEMIDE (LASIX) 20 MG TABLET    Take 20 mg by mouth daily as needed for fluid.    GLUCOSE BLOOD (ONE TOUCH ULTRA TEST)  TEST STRIP    Check blood sugar three times week 2 hours after meals, call is BS >200 DX R73.03   IMIPRAMINE (TOFRANIL) 50 MG TABLET    Take 50 mg by mouth at bedtime.   LAMOTRIGINE (LAMICTAL) 150 MG TABLET    Take 150 mg by mouth daily.   LOPERAMIDE (IMODIUM) 2 MG CAPSULE    Take by mouth as needed for diarrhea or loose stools.   LOSARTAN (COZAAR) 100 MG TABLET    TAKE 1 TABLET(100 MG) BY MOUTH DAILY   METOPROLOL TARTRATE (LOPRESSOR) 25 MG TABLET    TAKE 1 TABLET BY MOUTH TWICE DAILY   MONTELUKAST (SINGULAIR) 10 MG TABLET    Take 10 mg by mouth at bedtime.   MULTIPLE VITAMINS-MINERALS (MULTIVITAMIN WITH MINERALS) TABLET    Take 1 tablet by mouth daily.    ONDANSETRON (ZOFRAN) 4 MG TABLET    Take 1 tablet (4 mg total) by mouth every 8 (eight) hours as needed for nausea or vomiting.   ONETOUCH DELICA LANCETS 63O MISC    Check blood sugar three times week 2 hours after meals, call is BS >200 DX R73.03   OXYCODONE-ACETAMINOPHEN (PERCOCET/ROXICET) 5-325 MG PER TABLET    Take 1 tablet by mouth every 6 (six) hours as needed for moderate pain or severe pain.   ROSUVASTATIN (CRESTOR) 10 MG TABLET    Take 1 tablet (10 mg total) by mouth daily.   SERTRALINE (ZOLOFT) 100 MG TABLET    Take 100 mg by mouth daily.   SYNTHROID 150 MCG TABLET    TAKE 1 TABLET(150 MCG) BY MOUTH DAILY   TOPIRAMATE (TOPAMAX) 50 MG TABLET    Take 50 mg by mouth at bedtime.    VITAMIN C (ASCORBIC ACID) 500 MG TABLET    Take 500 mg by mouth daily.   VITAMIN D, ERGOCALCIFEROL, (DRISDOL) 50000 UNITS CAPS CAPSULE    Take 1 capsule (50,000 Units total) by mouth every 7 (seven) days. tuesdays   VITAMIN E 400 UNIT CAPSULE     Take 400 Units by mouth daily.   ZETIA 10 MG TABLET    TAKE 1 TABLET BY MOUTH EVERY DAY  Modified Medications   No medications on file  Discontinued Medications   EZETIMIBE (ZETIA) 10 MG TABLET    TAKE 1 TABLET BY MOUTH EVERY DAY   LEVOTHYROXINE (SYNTHROID) 150 MCG TABLET    Take 150 mcg by mouth daily before breakfast.    Review of Systems  Unable to perform ROS: Psychiatric disorder    Vitals:   12/15/15 1115  BP: (!) 120/92  Pulse: 71  Temp: 97.3 F (36.3 C)  TempSrc: Oral  SpO2: 95%  Weight: 241 lb 9.6 oz (109.6 kg)  Height: _0  (1.753 m)   Body mass index is 35.68 kg/m.  Physical Exam  Constitutional: She appears well-developed and well-nourished.  HENT:  Mouth/Throat: Oropharynx is clear and moist. No oropharyngeal exudate.  No ulcerations/lesions in mouth  Eyes: Pupils are equal, round, and reactive to light. No scleral icterus.  Neck: Neck supple. Carotid bruit is not present. No tracheal deviation present. Thyromegaly (left > right) present.  Cardiovascular: Normal rate, regular rhythm, normal heart sounds and intact distal pulses.  Exam reveals no gallop and no friction rub.   No murmur heard. No LE edema b/l. no calf TTP.   Pulmonary/Chest: Effort normal and breath sounds normal. No stridor. No respiratory distress. She has no wheezes. She has no rales.  CP not reproducible  Abdominal: Soft. Bowel sounds are normal. She exhibits no distension and no mass. There is no hepatomegaly. There is no tenderness. There is no rebound and no guarding.  Musculoskeletal: She exhibits edema and tenderness (lumbar muscles).  Lymphadenopathy:    She has no cervical adenopathy.  Neurological: She is alert. She has normal reflexes.  Skin: Skin is warm and dry. No rash noted.  Psychiatric: Her behavior is normal. Her mood appears anxious. Her speech is rapid and/or pressured.     Labs reviewed: Office Visit on 10/27/2015  Component Date Value Ref Range Status  . Glucose  10/28/2015 101* 65 - 99 mg/dL Final  . BUN 10/28/2015 12  6 - 24 mg/dL Final  . Creatinine, Ser 10/28/2015 0.77  0.57 - 1.00 mg/dL Final  . GFR calc non Af Amer 10/28/2015 96  >59 mL/min/1.73 Final  . GFR calc Af Amer 10/28/2015 111  >59 mL/min/1.73 Final  . BUN/Creatinine Ratio 10/28/2015 16  9 - 23 Final  . Sodium 10/28/2015 139  134 - 144 mmol/L Final  . Potassium 10/28/2015 5.2  3.5 - 5.2 mmol/L Final  . Chloride 10/28/2015 97  96 - 106 mmol/L Final  . CO2 10/28/2015 26  18 - 29 mmol/L Final  . Calcium 10/28/2015 9.7  8.7 - 10.2 mg/dL Final  . Total Protein 10/28/2015 6.9  6.0 - 8.5 g/dL Final  . Albumin 10/28/2015 4.6  3.5 - 5.5 g/dL Final  . Globulin, Total 10/28/2015 2.3  1.5 - 4.5 g/dL Final  . Albumin/Globulin Ratio 10/28/2015 2.0  1.2 - 2.2 Final  . Bilirubin Total 10/28/2015 0.4  0.0 - 1.2 mg/dL Final  . Alkaline Phosphatase 10/28/2015 91  39 - 117 IU/L Final  . AST 10/28/2015 47* 0 - 40 IU/L Final  . ALT 10/28/2015 54* 0 - 32 IU/L Final  . TSH 10/28/2015 1.430  0.450 - 4.500 uIU/mL Final  . Free T4 10/28/2015 1.50  0.82 - 1.77 ng/dL Final  . Specific Gravity, UA 10/28/2015 1.006  1.005 - 1.030 Final  . pH, UA 10/28/2015 7.0  5.0 - 7.5 Final  . Color, UA 10/28/2015 Yellow  Yellow Final  . Appearance Ur 10/28/2015 Clear  Clear Final  . Leukocytes, UA 10/28/2015 Negative  Negative Final  . Protein, UA 10/28/2015 Negative  Negative/Trace Final  . Glucose, UA 10/28/2015 Negative  Negative Final  . Ketones, UA 10/28/2015 Negative  Negative Final  . RBC, UA 10/28/2015 Negative  Negative Final  . Bilirubin, UA 10/28/2015 Negative  Negative Final  . Urobilinogen, Ur 10/28/2015 0.2  0.2 - 1.0 mg/dL Final  . Nitrite, UA 10/28/2015 Negative  Negative Final  . Microscopic Examination 10/28/2015 Comment   Final  . Hgb A1c MFr Bld 10/28/2015 5.8* 4.8 - 5.6 % Final   Comment:          Pre-diabetes: 5.7 - 6.4          Diabetes: >6.4          Glycemic control for adults with diabetes:  <7.0   . Est. average glucose Bld gHb Est-m* 10/28/2015 120  mg/dL Final    No results found.   Assessment/Plan   ICD-9-CM ICD-10-CM   1. Prediabetes - stable 790.29 R73.03   2. Hypothyroidism due to acquired atrophy of thyroid 244.8 E03.8    246.8 E03.4   3. Hyperlipidemia LDL goal <100 272.4 E78.5   4. Abnormal LFTs - with fatty liver per abdominal US 790.6 R79.89   5. Encounter for long-term (  current) use of high-risk medication V58.69 Z79.899   6. Seasonal allergic rhinitis due to pollen 477.0 J30.1    Continue current medications as ordered  Continue weight loss regimen  Follow up with specialists as scheduled  Follow up in 4 mos for routine visit. Fasting labs prior to appt (cmp, lipid panel, a1c, tsh)  Ravan Schlemmer S. Perlie Gold  [facility] [address] [place], [place] [address] [phone] Cell (Monday-Friday 8 AM - 5 PM) [phone] After 5 PM and follow prompts

## 2015-12-15 NOTE — Patient Instructions (Signed)
Continue current medications as ordered  Continue weight loss regimen  Follow up with specialists as scheduled  Follow up in 4 mos for routine visit. Fasting labs prior to appt

## 2015-12-18 ENCOUNTER — Other Ambulatory Visit: Payer: Self-pay | Admitting: Internal Medicine

## 2015-12-18 DIAGNOSIS — E785 Hyperlipidemia, unspecified: Secondary | ICD-10-CM

## 2015-12-19 ENCOUNTER — Telehealth: Payer: Self-pay | Admitting: Internal Medicine

## 2015-12-19 NOTE — Telephone Encounter (Signed)
Pt states she has been diagnosed with prediabetes and fatty liver. Pt states her liver enzymes were elevated and that her PCP mentioned that she had cirrhosis. Pt is frightened by this diagnosis. Per OV note it mentions fatty liver, recommend wt loss. Pt states she will call back when school starts to schedule an appointment to be seen. Pt also states that she has continued to have diarrhea since her gallbladder surgery.

## 2015-12-25 DIAGNOSIS — M5416 Radiculopathy, lumbar region: Secondary | ICD-10-CM | POA: Diagnosis not present

## 2015-12-25 DIAGNOSIS — M4726 Other spondylosis with radiculopathy, lumbar region: Secondary | ICD-10-CM | POA: Diagnosis not present

## 2015-12-25 DIAGNOSIS — Z79891 Long term (current) use of opiate analgesic: Secondary | ICD-10-CM | POA: Diagnosis not present

## 2015-12-25 DIAGNOSIS — G894 Chronic pain syndrome: Secondary | ICD-10-CM | POA: Diagnosis not present

## 2015-12-29 ENCOUNTER — Other Ambulatory Visit: Payer: Self-pay | Admitting: Obstetrics and Gynecology

## 2015-12-29 DIAGNOSIS — Z1231 Encounter for screening mammogram for malignant neoplasm of breast: Secondary | ICD-10-CM

## 2016-01-04 DIAGNOSIS — R69 Illness, unspecified: Secondary | ICD-10-CM | POA: Diagnosis not present

## 2016-01-16 ENCOUNTER — Other Ambulatory Visit: Payer: Self-pay | Admitting: Internal Medicine

## 2016-01-16 DIAGNOSIS — R197 Diarrhea, unspecified: Secondary | ICD-10-CM

## 2016-01-19 ENCOUNTER — Other Ambulatory Visit: Payer: Self-pay

## 2016-01-23 ENCOUNTER — Other Ambulatory Visit: Payer: Self-pay | Admitting: *Deleted

## 2016-01-23 MED ORDER — LOSARTAN POTASSIUM 100 MG PO TABS: ORAL_TABLET | ORAL | 1 refills | Status: DC

## 2016-01-23 NOTE — Telephone Encounter (Signed)
Received fax from [facility] regarding recommendation to switch from 30 day supply to 90 day supply. Faxed.

## 2016-02-08 ENCOUNTER — Ambulatory Visit
Admission: RE | Admit: 2016-02-08 | Discharge: 2016-02-08 | Disposition: A | Discharge status: Home / Self Care | Payer: Medicare HMO | Source: Ambulatory Visit | Attending: Obstetrics and Gynecology | Admitting: Obstetrics and Gynecology

## 2016-02-08 DIAGNOSIS — Z1231 Encounter for screening mammogram for malignant neoplasm of breast: Secondary | ICD-10-CM | POA: Diagnosis not present

## 2016-02-18 ENCOUNTER — Other Ambulatory Visit: Payer: Self-pay | Admitting: Internal Medicine

## 2016-02-18 DIAGNOSIS — R69 Illness, unspecified: Secondary | ICD-10-CM | POA: Diagnosis not present

## 2016-02-18 DIAGNOSIS — E559 Vitamin D deficiency, unspecified: Secondary | ICD-10-CM

## 2016-02-19 ENCOUNTER — Other Ambulatory Visit: Payer: Self-pay | Admitting: *Deleted

## 2016-02-19 DIAGNOSIS — N760 Acute vaginitis: Secondary | ICD-10-CM | POA: Diagnosis not present

## 2016-02-19 DIAGNOSIS — Z6834 Body mass index (BMI) 34.0-34.9, adult: Secondary | ICD-10-CM | POA: Diagnosis not present

## 2016-02-19 DIAGNOSIS — E785 Hyperlipidemia, unspecified: Secondary | ICD-10-CM

## 2016-02-19 DIAGNOSIS — N390 Urinary tract infection, site not specified: Secondary | ICD-10-CM | POA: Diagnosis not present

## 2016-02-19 DIAGNOSIS — Z01419 Encounter for gynecological examination (general) (routine) without abnormal findings: Secondary | ICD-10-CM | POA: Diagnosis not present

## 2016-02-19 MED ORDER — ROSUVASTATIN CALCIUM 10 MG PO TABS: 10.0000 mg | ORAL_TABLET | Freq: Every day | ORAL | 6 refills | Status: DC

## 2016-02-19 NOTE — Telephone Encounter (Signed)
[facility].  

## 2016-02-20 DIAGNOSIS — G894 Chronic pain syndrome: Secondary | ICD-10-CM | POA: Diagnosis not present

## 2016-02-20 DIAGNOSIS — Z79891 Long term (current) use of opiate analgesic: Secondary | ICD-10-CM | POA: Diagnosis not present

## 2016-02-20 DIAGNOSIS — M4726 Other spondylosis with radiculopathy, lumbar region: Secondary | ICD-10-CM | POA: Diagnosis not present

## 2016-02-20 DIAGNOSIS — M5416 Radiculopathy, lumbar region: Secondary | ICD-10-CM | POA: Diagnosis not present

## 2016-02-22 ENCOUNTER — Telehealth: Payer: Self-pay

## 2016-02-22 NOTE — Telephone Encounter (Signed)
Patient left message on triage voicemail: Patient called c/o burning when urinating  X couple of weeks. Patient is requesting medication  Per protocol at [facility] patient needs office visit to be evaluated for UTI and rx for antibiotics.  I scheduled patient for appointment tomorrow at 9:15 am with Dr.Carter. Left message on voicemail for patient to return call when available

## 2016-02-22 NOTE — Telephone Encounter (Signed)
Patient returned call, patient ok to be seen tomorrow at 9:15

## 2016-02-23 ENCOUNTER — Ambulatory Visit (INDEPENDENT_AMBULATORY_CARE_PROVIDER_SITE_OTHER): Payer: Medicare HMO | Admitting: Internal Medicine

## 2016-02-23 ENCOUNTER — Encounter: Payer: Self-pay | Admitting: Internal Medicine

## 2016-02-23 VITALS — BP 132/84 | HR 81 | Temp 98.0°F | Ht 69.0 in | Wt 227.6 lb

## 2016-02-23 DIAGNOSIS — Z23 Encounter for immunization: Secondary | ICD-10-CM

## 2016-02-23 DIAGNOSIS — N3090 Cystitis, unspecified without hematuria: Secondary | ICD-10-CM

## 2016-02-23 DIAGNOSIS — R300 Dysuria: Secondary | ICD-10-CM

## 2016-02-23 LAB — POCT URINALYSIS DIPSTICK
Bilirubin, UA: NEGATIVE
Blood, UA: NEGATIVE
Glucose, UA: NEGATIVE
Nitrite, UA: NEGATIVE
Protein, UA: NEGATIVE
Spec Grav, UA: 1.01
Urobilinogen, UA: 0.2
pH, UA: 6

## 2016-02-23 MED ORDER — PHENAZOPYRIDINE HCL 200 MG PO TABS: 200.0000 mg | ORAL_TABLET | Freq: Three times a day (TID) | ORAL | 0 refills | Status: DC | PRN

## 2016-02-23 MED ORDER — CIPROFLOXACIN HCL 500 MG PO TABS: 500.0000 mg | ORAL_TABLET | Freq: Two times a day (BID) | ORAL | 0 refills | Status: DC

## 2016-02-23 MED ORDER — FLUCONAZOLE 150 MG PO TABS: ORAL_TABLET | ORAL | 0 refills | Status: DC

## 2016-02-23 NOTE — Progress Notes (Signed)
Patient ID: Andrea [name], female   DOB: [birth year], 40 y.o.   MRN: [id number]    Location:  [facility] Place of Service: OFFICE  Chief Complaint  Patient presents with  . Acute Visit    burning when urinating,no itching, started 3 weeks ago  . Flu Vaccine    requested    HPI:  40 yo female seen today for dysuria x 3 weeks. She has nocturia 8 times at night. She reports increased frequency and urgency, pelvic pain "menstrual cramps". She has TAH/BSO. She saw a gyn earlier this week and was told she needed to f/u with PCP. Vaginal swab neg at GYN and was told no bladder prolapse. Urine dipstick showed "white cells". No f/c. No N/V.  Obesity - she has lost 14 lbs since last OV. LFTs slightly elevated.  Hypothyroid - controlled on brand synthroid. TSH 1.43; free T4 1.5  BP stable at home on lasix, losartan and lopressor. She takes ASA daily  Hyperlipidemia - stale on zetia/crestor. LDL 126. TG 348  Vit D defic - improved on supplement. Vit D19.2  Allergic rhinitis - stable on prn singulair  Prediabetes - A1c 5.8%  Diarrhea/GERD/IBS - takes [place]. Acid reflux stable on nexium. Takes prn zofran  She sees psych for OCD/social anxiety/depression. Takes sertraline, lamictal, clonazepam, wellbutrin sr and alprazolam.  She has an autistic son.  She discovered that her ex husband is IVDA and shared needles with Hep C and HIV (+) individual. Last sexual contact in 2010. She [facility]. Neg Hepatitis panel. RUQ US showed fatty liver  She is a poor historian due to psych dx. Hx obtained from chart  Past Medical History:  Diagnosis Date  . Anxiety   . Arthritis    ruptured lumbar disc-careful with positioning  . Blood dyscrasia    protein s deficiency-no treatment since 2006  . Depression   . GERD (gastroesophageal reflux disease)   . Headache(784.0)   . Hemorrhoids   . High cholesterol   . Hyperlipemia   . Hypertension   . Hypothyroidism   . IBS  (irritable bowel syndrome)   . MVP (mitral valve prolapse)    echo per dr Dagmar Hait  . Protein S deficiency ([place]) 2003   dvt/pulmonary embolus-on coumadin for 6 months then off-Sees [facility]  . Pulmonary embolism ([place])   . PVC (premature ventricular contraction)     Past Surgical History:  Procedure Laterality Date  . CESAREAN SECTION  2006  . CHOLECYSTECTOMY N/A 11/03/2014   Procedure: LAPAROSCOPIC CHOLECYSTECTOMY WITH INTRAOPERATIVE CHOLANGIOGRAM;  Surgeon: Georganna Skeans, MD;  Location: [facility];  Service: General;  Laterality: N/A;  . LAPAROSCOPIC ASSISTED VAGINAL HYSTERECTOMY  03/27/2011   Procedure: LAPAROSCOPIC ASSISTED VAGINAL HYSTERECTOMY;  Surgeon: Cyril Mourning, MD;  Location: [facility];  Service: Gynecology;  Laterality: N/A;  . SALPINGOOPHORECTOMY  03/27/2011   Procedure: SALPINGO OOPHERECTOMY;  Surgeon: Cyril Mourning, MD;  Location: [facility];  Service: Gynecology;  Laterality: Bilateral;  . TONSILLECTOMY  92  . vaginal reconstructive surgery  2001    Patient Care Team: Gildardo Cranker, DO as PCP - General (Internal Medicine) Elsie Stain, MD as Attending Physician (Pulmonary Disease) Irene Shipper, MD as Consulting Physician (Gastroenterology) Chucky May, MD as Consulting Physician (Psychiatry) Adrian Prows, MD as Consulting Physician (Cardiology) Dian Queen, MD as Consulting Physician (Obstetrics and Gynecology) Nicholaus Bloom, MD (Anesthesiology)  Social History   Social History  . Marital status: Divorced    Spouse  name: N/A  . Number of children: 2  . Years of education: N/A   Occupational History  . 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 Smoking status: Former Smoker    Packs/day: 1.00    Years: 15.00    Types: Cigarettes    Quit date: 03/18/2004  . Smokeless tobacco: Never Used  . Alcohol use Yes     Comment: rarely  . Drug use: No  . Sexual activity: Not on file   Other Topics Concern  . Not on file    Social History Narrative   Diet: Regular   Caffeine: Yes   Married- divorced, married in 2006   House: Yes, 2 persons   Pets: 1 dog   Current/Past profession: [employer] 2002-2013   Exercise: Yes, walking   Living Will: No    DNR: No    POA/HPOA: No            reports that she quit smoking about 11 years ago. Her smoking use included Cigarettes. She has a 15.00 pack-year smoking history. She has never used smokeless tobacco. She reports that she drinks alcohol. She reports that she does not use drugs.  Family History  Problem Relation Age of Onset  . High blood pressure Mother   . Hyperlipidemia Father     fathers side of family  . High blood pressure Father   . Hypertension Other     entire family on both sides  . Asthma Son   . Asthma Son   . Clotting disorder Maternal Uncle   . Clotting disorder Paternal Grandmother   . Heart disease Maternal Grandfather    Family Status  Relation Status  . Mother Alive  . Father Alive  . Sister Alive  . Son Alive  . Son Alive  . Other   . Son   . Son   . Maternal Uncle   . Paternal Grandmother   . Maternal Grandfather      No Known Allergies  Medications: Patient's Medications  New Prescriptions   No medications on file  Previous Medications   ALPRAZOLAM (XANAX) 1 MG TABLET    Take 1 mg by mouth at bedtime as needed.   ASPIRIN EC 81 MG TABLET    Take 81 mg by mouth daily.   BLOOD GLUCOSE MONITORING SUPPL (ONE TOUCH ULTRA SYSTEM KIT) W/DEVICE KIT    1 kit by Does not apply route once. Check blood sugar three times a week 2 hours after meals, call is BS >200 DX R73.03   BUPROPION (WELLBUTRIN SR) 150 MG 12 HR TABLET    Take 150 mg by mouth daily.   CALCIUM CARBONATE (OS-CAL) 600 MG TABS    Take 600 mg by mouth daily.   CHOLESTYRAMINE (QUESTRAN) 4 G PACKET    MIX AND DRINK 1 PACKET(4 GRAMS) BY MOUTH TWICE DAILY   CLONAZEPAM (KLONOPIN) 1 MG TABLET    Take 1 mg by mouth 4 (four) times daily.   COENZYME Q10 (COQ10) 100  MG CAPS    Take 100 mg by mouth daily.    ESOMEPRAZOLE (NEXIUM) 40 MG CAPSULE    Take 1 capsule (40 mg total) by mouth daily.   EZETIMIBE (ZETIA) 10 MG TABLET    TAKE 1 TABLET BY MOUTH EVERY DAY   FLUVOXAMINE (LUVOX) 100 MG TABLET    Take 100 mg by mouth at bedtime.   FUROSEMIDE (LASIX) 20 MG TABLET    Take 20 mg by  mouth daily as needed for fluid.    GLUCOSE BLOOD (ONE TOUCH ULTRA TEST) TEST STRIP    Check blood sugar three times week 2 hours after meals, call is BS >200 DX R73.03   IMIPRAMINE (TOFRANIL) 50 MG TABLET    Take 50 mg by mouth at bedtime.   LAMOTRIGINE (LAMICTAL) 150 MG TABLET    Take 150 mg by mouth daily.   LOPERAMIDE (IMODIUM) 2 MG CAPSULE    Take by mouth as needed for diarrhea or loose stools.   LOSARTAN (COZAAR) 100 MG TABLET    Take one tablet by mouth once daily   METOPROLOL TARTRATE (LOPRESSOR) 25 MG TABLET    TAKE 1 TABLET BY MOUTH TWICE DAILY   MONTELUKAST (SINGULAIR) 10 MG TABLET    Take 10 mg by mouth at bedtime.   MULTIPLE VITAMINS-MINERALS (MULTIVITAMIN WITH MINERALS) TABLET    Take 1 tablet by mouth daily.    ONDANSETRON (ZOFRAN) 4 MG TABLET    Take 1 tablet (4 mg total) by mouth every 8 (eight) hours as needed for nausea or vomiting.   ONETOUCH DELICA LANCETS 25Z MISC    Check blood sugar three times week 2 hours after meals, call is BS >200 DX R73.03   OXYCODONE-ACETAMINOPHEN (PERCOCET/ROXICET) 5-325 MG PER TABLET    Take 1 tablet by mouth every 6 (six) hours as needed for moderate pain or severe pain.   ROSUVASTATIN (CRESTOR) 10 MG TABLET    Take 1 tablet (10 mg total) by mouth daily.   SERTRALINE (ZOLOFT) 100 MG TABLET    Take 100 mg by mouth daily.   SYNTHROID 150 MCG TABLET    TAKE 1 TABLET(150 MCG) BY MOUTH DAILY   VITAMIN C (ASCORBIC ACID) 500 MG TABLET    Take 500 mg by mouth daily.   VITAMIN D, ERGOCALCIFEROL, (DRISDOL) 50000 UNITS CAPS CAPSULE    TAKE 1 CAPSULE BY MOUTH EVERY 7 DAYS(ON TUESDAYS)   VITAMIN E 400 UNIT CAPSULE    Take 400 Units by mouth  daily.   ZETIA 10 MG TABLET    TAKE 1 TABLET BY MOUTH EVERY DAY  Modified Medications   No medications on file  Discontinued Medications   No medications on file    Review of Systems  Unable to perform ROS: Psychiatric disorder    Vitals:   02/23/16 0922  BP: 132/84  Pulse: 81  Temp: 98 F (36.7 C)  TempSrc: Oral  SpO2: 97%  Weight: 227 lb 9.6 oz (103.2 kg)  Height: 5' 9" (1.753 m)   Body mass index is 33.61 kg/m.  Physical Exam  Constitutional: She appears well-developed and well-nourished.  HENT:  No ulcerations/lesions in mouth  Eyes: Pupils are equal, round, and reactive to light.  Neck: Neck supple. Carotid bruit is not present.  Cardiovascular: Normal rate, regular rhythm, normal heart sounds and intact distal pulses.  Exam reveals no gallop and no friction rub.   No murmur heard. No LE edema b/l. no calf TTP.   Pulmonary/Chest: Effort normal and breath sounds normal. No respiratory distress. She has no wheezes. She has no rales.  CP not reproducible  Abdominal: Soft. Bowel sounds are normal. She exhibits no distension and no mass. There is no hepatomegaly. There is tenderness (suprapubic ). There is no rebound and no guarding.  Neurological: She is alert.  Skin: Skin is warm and dry. No rash noted.  Psychiatric: Her behavior is normal. Her mood appears anxious. Her speech is rapid and/or pressured.  Labs reviewed: No visits with results within 3 Month(s) from this visit.  Latest known visit with results is:  Office Visit on 10/27/2015  Component Date Value Ref Range Status  . Glucose 10/28/2015 101* 65 - 99 mg/dL Final  . BUN 10/28/2015 12  6 - 24 mg/dL Final  . Creatinine, Ser 10/28/2015 0.77  0.57 - 1.00 mg/dL Final  . GFR calc non Af Amer 10/28/2015 96  >59 mL/min/1.73 Final  . GFR calc Af Amer 10/28/2015 111  >59 mL/min/1.73 Final  . BUN/Creatinine Ratio 10/28/2015 16  9 - 23 Final  . Sodium 10/28/2015 139  134 - 144 mmol/L Final  . Potassium  10/28/2015 5.2  3.5 - 5.2 mmol/L Final  . Chloride 10/28/2015 97  96 - 106 mmol/L Final  . CO2 10/28/2015 26  18 - 29 mmol/L Final  . Calcium 10/28/2015 9.7  8.7 - 10.2 mg/dL Final  . Total Protein 10/28/2015 6.9  6.0 - 8.5 g/dL Final  . Albumin 10/28/2015 4.6  3.5 - 5.5 g/dL Final  . Globulin, Total 10/28/2015 2.3  1.5 - 4.5 g/dL Final  . Albumin/Globulin Ratio 10/28/2015 2.0  1.2 - 2.2 Final  . Bilirubin Total 10/28/2015 0.4  0.0 - 1.2 mg/dL Final  . Alkaline Phosphatase 10/28/2015 91  39 - 117 IU/L Final  . AST 10/28/2015 47* 0 - 40 IU/L Final  . ALT 10/28/2015 54* 0 - 32 IU/L Final  . TSH 10/28/2015 1.430  0.450 - 4.500 uIU/mL Final  . Free T4 10/28/2015 1.50  0.82 - 1.77 ng/dL Final  . Specific Gravity, UA 10/28/2015 1.006  1.005 - 1.030 Final  . pH, UA 10/28/2015 7.0  5.0 - 7.5 Final  . Color, UA 10/28/2015 Yellow  Yellow Final  . Appearance Ur 10/28/2015 Clear  Clear Final  . Leukocytes, UA 10/28/2015 Negative  Negative Final  . Protein, UA 10/28/2015 Negative  Negative/Trace Final  . Glucose, UA 10/28/2015 Negative  Negative Final  . Ketones, UA 10/28/2015 Negative  Negative Final  . RBC, UA 10/28/2015 Negative  Negative Final  . Bilirubin, UA 10/28/2015 Negative  Negative Final  . Urobilinogen, Ur 10/28/2015 0.2  0.2 - 1.0 mg/dL Final  . Nitrite, UA 10/28/2015 Negative  Negative Final  . Microscopic Examination 10/28/2015 Comment   Final  . Hgb A1c MFr Bld 10/28/2015 5.8* 4.8 - 5.6 % Final   Comment:          Pre-diabetes: 5.7 - 6.4          Diabetes: >6.4          Glycemic control for adults with diabetes: <7.0   . Est. average glucose Bld gHb Est-m* 10/28/2015 120  mg/dL Final    Mm Screening Breast Tomo Bilateral  Result Date: 02/12/2016 CLINICAL DATA:  Screening. EXAM: 2D DIGITAL SCREENING BILATERAL MAMMOGRAM WITH CAD AND ADJUNCT TOMO COMPARISON:  Previous exam(s). ACR Breast Density Category b: There are scattered areas of fibroglandular density. FINDINGS: There are  no findings suspicious for malignancy. Images were processed with CAD. IMPRESSION: No mammographic evidence of malignancy. A result letter of this screening mammogram will be mailed directly to the patient. RECOMMENDATION: Screening mammogram in one year. (Code:[id number]) BI-RADS CATEGORY  1: Negative. Electronically Signed   By: Ammie Ferrier M.D.   On: 02/12/2016 12:29     Assessment/Plan   ICD-9-CM ICD-10-CM   1. Cystitis 595.9 N30.90 ciprofloxacin (CIPRO) 500 MG tablet     phenazopyridine (PYRIDIUM) 200 MG tablet  Culture, Urine  2. Dysuria 788.1 R30.0 phenazopyridine (PYRIDIUM) 200 MG tablet     POC Urinalysis Dipstick  3. Need for immunization against influenza V04.81 Z23 Flu Vaccine QUAD 36+ mos PF IM (Fluarix & Fluzone Quad PF)     T/c urology eval if no better  Take all of antibiotic as ordered  Push fluids daily  Recommend OTC probiotic daily while on antibiotic  Continue other medications as ordered  Follow up as scheduled   S. Perlie Gold  [facility] [address] [place], [place] [address] [phone] Cell (Monday-Friday 8 AM - 5 PM) [phone] After 5 PM and follow prompts

## 2016-02-23 NOTE — Patient Instructions (Addendum)
Take all of antibiotic as ordered  Push fluids daily  Recommend OTC probiotic daily while on antibiotic  Continue other medications as ordered  Follow up as scheduled   Urinary Tract Infection Urinary tract infections (UTIs) can develop anywhere along your urinary tract. Your urinary tract is your body's drainage system for removing wastes and extra water. Your urinary tract includes two kidneys, two ureters, a bladder, and a urethra. Your kidneys are a pair of bean-shaped organs. Each kidney is about the size of your fist. They are located below your ribs, one on each side of your spine. CAUSES Infections are caused by microbes, which are microscopic organisms, including fungi, viruses, and bacteria. These organisms are so small that they can only be seen through a microscope. Bacteria are the microbes that most commonly cause UTIs. SYMPTOMS  Symptoms of UTIs may vary by age and gender of the patient and by the location of the infection. Symptoms in young women typically include a frequent and intense urge to urinate and a painful, burning feeling in the bladder or urethra during urination. Older women and men are more likely to be tired, shaky, and weak and have muscle aches and abdominal pain. A fever may mean the infection is in your kidneys. Other symptoms of a kidney infection include pain in your back or sides below the ribs, nausea, and vomiting. DIAGNOSIS To diagnose a UTI, your caregiver will ask you about your symptoms. Your caregiver will also ask you to provide a urine sample. The urine sample will be tested for bacteria and white blood cells. White blood cells are made by your body to help fight infection. TREATMENT  Typically, UTIs can be treated with medication. Because most UTIs are caused by a bacterial infection, they usually can be treated with the use of antibiotics. The choice of antibiotic and length of treatment depend on your symptoms and the type of bacteria causing your  infection. HOME CARE INSTRUCTIONS  If you were prescribed antibiotics, take them exactly as your caregiver instructs you. Finish the medication even if you feel better after you have only taken some of the medication.  Drink enough water and fluids to keep your urine clear or pale yellow.  Avoid caffeine, tea, and carbonated beverages. They tend to irritate your bladder.  Empty your bladder often. Avoid holding urine for long periods of time.  Empty your bladder before and after sexual intercourse.  After a bowel movement, women should cleanse from front to back. Use each tissue only once. SEEK MEDICAL CARE IF:   You have back pain.  You develop a fever.  Your symptoms do not begin to resolve within 3 days. SEEK IMMEDIATE MEDICAL CARE IF:   You have severe back pain or lower abdominal pain.  You develop chills.  You have nausea or vomiting.  You have continued burning or discomfort with urination. MAKE SURE YOU:   Understand these instructions.  Will watch your condition.  Will get help right away if you are not doing well or get worse.   This information is not intended to replace advice given to you by your health care provider. Make sure you discuss any questions you have with your health care provider.   Document Released: 02/13/2005 Document Revised: 01/25/2015 Document Reviewed: 06/14/2011 Elsevier Interactive Patient Education [employer].

## 2016-02-24 LAB — URINE CULTURE

## 2016-02-26 DIAGNOSIS — R69 Illness, unspecified: Secondary | ICD-10-CM | POA: Diagnosis not present

## 2016-03-23 ENCOUNTER — Other Ambulatory Visit: Payer: Self-pay | Admitting: Internal Medicine

## 2016-03-23 DIAGNOSIS — E559 Vitamin D deficiency, unspecified: Secondary | ICD-10-CM

## 2016-03-23 DIAGNOSIS — E785 Hyperlipidemia, unspecified: Secondary | ICD-10-CM

## 2016-03-24 ENCOUNTER — Other Ambulatory Visit: Payer: Self-pay | Admitting: Internal Medicine

## 2016-03-24 DIAGNOSIS — E559 Vitamin D deficiency, unspecified: Secondary | ICD-10-CM

## 2016-03-27 ENCOUNTER — Other Ambulatory Visit: Payer: Self-pay | Admitting: Internal Medicine

## 2016-03-27 DIAGNOSIS — E559 Vitamin D deficiency, unspecified: Secondary | ICD-10-CM

## 2016-04-01 ENCOUNTER — Other Ambulatory Visit: Payer: Self-pay | Admitting: *Deleted

## 2016-04-01 DIAGNOSIS — E559 Vitamin D deficiency, unspecified: Secondary | ICD-10-CM

## 2016-04-01 MED ORDER — VITAMIN D (ERGOCALCIFEROL) 1.25 MG (50000 UNIT) PO CAPS: ORAL_CAPSULE | ORAL | 1 refills | Status: DC

## 2016-04-01 NOTE — Telephone Encounter (Signed)
Patient requested. Stated she never received the other Rx faxed in, pharmacy told her wasn't received. Refaxed.

## 2016-04-06 ENCOUNTER — Other Ambulatory Visit: Payer: Self-pay | Admitting: Internal Medicine

## 2016-04-08 DIAGNOSIS — R69 Illness, unspecified: Secondary | ICD-10-CM | POA: Diagnosis not present

## 2016-04-15 ENCOUNTER — Other Ambulatory Visit: Payer: Medicare HMO

## 2016-04-16 DIAGNOSIS — Z79891 Long term (current) use of opiate analgesic: Secondary | ICD-10-CM | POA: Diagnosis not present

## 2016-04-16 DIAGNOSIS — G894 Chronic pain syndrome: Secondary | ICD-10-CM | POA: Diagnosis not present

## 2016-04-16 DIAGNOSIS — M5416 Radiculopathy, lumbar region: Secondary | ICD-10-CM | POA: Diagnosis not present

## 2016-04-16 DIAGNOSIS — M4726 Other spondylosis with radiculopathy, lumbar region: Secondary | ICD-10-CM | POA: Diagnosis not present

## 2016-04-17 ENCOUNTER — Ambulatory Visit: Payer: Medicare HMO | Admitting: Internal Medicine

## 2016-04-24 ENCOUNTER — Other Ambulatory Visit: Payer: Self-pay | Admitting: Internal Medicine

## 2016-05-07 ENCOUNTER — Other Ambulatory Visit: Payer: Medicare HMO

## 2016-05-10 ENCOUNTER — Ambulatory Visit: Payer: Medicare HMO | Admitting: Internal Medicine

## 2016-05-15 ENCOUNTER — Other Ambulatory Visit: Payer: Self-pay | Admitting: Internal Medicine

## 2016-05-15 DIAGNOSIS — R69 Illness, unspecified: Secondary | ICD-10-CM | POA: Diagnosis not present

## 2016-05-18 ENCOUNTER — Other Ambulatory Visit: Payer: Self-pay | Admitting: Internal Medicine

## 2016-05-18 DIAGNOSIS — E039 Hypothyroidism, unspecified: Secondary | ICD-10-CM

## 2016-05-20 DIAGNOSIS — R69 Illness, unspecified: Secondary | ICD-10-CM | POA: Diagnosis not present

## 2016-05-22 ENCOUNTER — Other Ambulatory Visit: Payer: Self-pay

## 2016-05-22 DIAGNOSIS — E785 Hyperlipidemia, unspecified: Secondary | ICD-10-CM

## 2016-05-22 DIAGNOSIS — I10 Essential (primary) hypertension: Secondary | ICD-10-CM

## 2016-05-22 DIAGNOSIS — R7303 Prediabetes: Secondary | ICD-10-CM | POA: Insufficient documentation

## 2016-05-22 DIAGNOSIS — E039 Hypothyroidism, unspecified: Secondary | ICD-10-CM

## 2016-05-26 ENCOUNTER — Other Ambulatory Visit: Payer: Self-pay | Admitting: Internal Medicine

## 2016-05-26 DIAGNOSIS — E559 Vitamin D deficiency, unspecified: Secondary | ICD-10-CM

## 2016-05-29 ENCOUNTER — Other Ambulatory Visit: Payer: Medicare HMO

## 2016-05-29 DIAGNOSIS — I10 Essential (primary) hypertension: Secondary | ICD-10-CM | POA: Diagnosis not present

## 2016-05-29 DIAGNOSIS — E785 Hyperlipidemia, unspecified: Secondary | ICD-10-CM | POA: Diagnosis not present

## 2016-05-29 DIAGNOSIS — R7303 Prediabetes: Secondary | ICD-10-CM

## 2016-05-29 DIAGNOSIS — E039 Hypothyroidism, unspecified: Secondary | ICD-10-CM

## 2016-05-29 LAB — LIPID PANEL
Cholesterol: 260 mg/dL — ABNORMAL HIGH (ref <200)
HDL: 49 mg/dL — ABNORMAL LOW (ref >50)
LDL Cholesterol: 177 mg/dL — ABNORMAL HIGH (ref <100)
Total CHOL/HDL Ratio: 5.3 ratio — ABNORMAL HIGH (ref <5.0)
Triglycerides: 171 mg/dL — ABNORMAL HIGH (ref <150)
VLDL: 34 mg/dL — ABNORMAL HIGH (ref <30)

## 2016-05-29 LAB — COMPLETE METABOLIC PANEL WITHOUT GFR
ALT: 22 U/L (ref 6–29)
AST: 28 U/L (ref 10–30)
Albumin: 4.1 g/dL (ref 3.6–5.1)
Alkaline Phosphatase: 75 U/L (ref 33–115)
BUN: 17 mg/dL (ref 7–25)
CO2: 27 mmol/L (ref 20–31)
Calcium: 9.5 mg/dL (ref 8.6–10.2)
Chloride: 102 mmol/L (ref 98–110)
Creat: 0.96 mg/dL (ref 0.50–1.10)
GFR, Est African American: 84 mL/min (ref >=60)
GFR, Est Non African American: 73 mL/min (ref >=60)
Glucose, Bld: 109 mg/dL — ABNORMAL HIGH (ref 65–99)
Potassium: 4.4 mmol/L (ref 3.5–5.3)
Sodium: 138 mmol/L (ref 135–146)
Total Bilirubin: 0.9 mg/dL (ref 0.2–1.2)
Total Protein: 6.7 g/dL (ref 6.1–8.1)

## 2016-05-29 LAB — HEMOGLOBIN A1C
Hgb A1c MFr Bld: 5.4 % (ref <5.7)
Mean Plasma Glucose: 108 mg/dL

## 2016-05-29 LAB — TSH: TSH: 0.26 m[IU]/L — ABNORMAL LOW

## 2016-05-31 ENCOUNTER — Encounter: Payer: Self-pay | Admitting: Internal Medicine

## 2016-05-31 ENCOUNTER — Ambulatory Visit (INDEPENDENT_AMBULATORY_CARE_PROVIDER_SITE_OTHER): Payer: Medicare HMO | Admitting: Internal Medicine

## 2016-05-31 VITALS — BP 110/80 | HR 67 | Temp 98.4°F | Ht 69.0 in | Wt 225.8 lb

## 2016-05-31 DIAGNOSIS — M5416 Radiculopathy, lumbar region: Secondary | ICD-10-CM

## 2016-05-31 DIAGNOSIS — E559 Vitamin D deficiency, unspecified: Secondary | ICD-10-CM

## 2016-05-31 DIAGNOSIS — E785 Hyperlipidemia, unspecified: Secondary | ICD-10-CM

## 2016-05-31 DIAGNOSIS — E034 Atrophy of thyroid (acquired): Secondary | ICD-10-CM | POA: Diagnosis not present

## 2016-05-31 DIAGNOSIS — F4010 Social phobia, unspecified: Secondary | ICD-10-CM | POA: Diagnosis not present

## 2016-05-31 DIAGNOSIS — Z79899 Other long term (current) drug therapy: Secondary | ICD-10-CM

## 2016-05-31 DIAGNOSIS — R300 Dysuria: Secondary | ICD-10-CM | POA: Diagnosis not present

## 2016-05-31 DIAGNOSIS — R69 Illness, unspecified: Secondary | ICD-10-CM | POA: Diagnosis not present

## 2016-05-31 NOTE — Patient Instructions (Addendum)
May need urology eval for persistent urinary tract issues  Continue current medications as ordered  Follow up with specialists as scheduled  Reduce calories in diet and increase exercise  Follow up in 4 mos for CPE/ECG. Check fasting labs prior to appt

## 2016-05-31 NOTE — Progress Notes (Signed)
Patient ID: Andrea [name], female   DOB: 1975-08-14, 41 y.o.   MRN: [id number]    Location:  [facility] Place of Service: OFFICE  Chief Complaint  Patient presents with  . Medical Management of Chronic Issues    4 months routine visit    HPI:  41 yo female seen today for f/u. She reports frequent panic attacks with associated CP and left arm pain. ECGs in past have shown no acute ischemia.   She was tx with ampicillin for Group B strep UTI in Oct 2017. She is interested in seeing urology due to persistent urinary tract sx's.  Obesity - she has lost 2 lbs since last OV (total 26 lbs in last 6 mos). LFTs nml.  Hypothyroidsm - controlled on brand synthroid. TSH 0.26  BP stable at home on lasix, losartan and lopressor. She takes ASA daily  Hyperlipidemia - stale on zetia/crestor. LDL 177; TG 171; Tchol 260  Vit D defic - improved on supplement. Vit D 19.2  Allergic rhinitis - stable on prn singulair  Prediabetes - A1c 5.4%  Diarrhea/GERD/IBS - takes [place]. Acid reflux stable on nexium. Takes prn zofran  She sees psych Dr Toy Care for OCD/social anxiety/depression. Takes fluoxetine, lamictal, clonazepam, wellbutrin sr and alprazolam.  She has an autistic son. Sertraline stopped due to ADRs.    She discovered that her ex husband is IVDA and shared needles with Hep C and HIV (+) individual. Last sexual contact in 2010. She [facility]. Neg Hepatitis panel. RUQ US showed fatty liver  She is a poor historian due to psych dx. Hx obtained from chart  Past Medical History:  Diagnosis Date  . Anxiety   . Arthritis    ruptured lumbar disc-careful with positioning  . Blood dyscrasia    protein s deficiency-no treatment since 2006  . Depression   . GERD (gastroesophageal reflux disease)   . Headache(784.0)   . Hemorrhoids   . High cholesterol   . Hyperlipemia   . Hypertension   . Hypothyroidism   . IBS (irritable bowel syndrome)   . MVP (mitral valve  prolapse)    echo per dr Dagmar Hait  . Protein S deficiency ([place]) 2003   dvt/pulmonary embolus-on coumadin for 6 months then off-Sees [facility]  . Pulmonary embolism ([place])   . PVC (premature ventricular contraction)     Past Surgical History:  Procedure Laterality Date  . CESAREAN SECTION  2006  . CHOLECYSTECTOMY N/A 11/03/2014   Procedure: LAPAROSCOPIC CHOLECYSTECTOMY WITH INTRAOPERATIVE CHOLANGIOGRAM;  Surgeon: Georganna Skeans, MD;  Location: [facility];  Service: General;  Laterality: N/A;  . LAPAROSCOPIC ASSISTED VAGINAL HYSTERECTOMY  03/27/2011   Procedure: LAPAROSCOPIC ASSISTED VAGINAL HYSTERECTOMY;  Surgeon: Cyril Mourning, MD;  Location: [facility];  Service: Gynecology;  Laterality: N/A;  . SALPINGOOPHORECTOMY  03/27/2011   Procedure: SALPINGO OOPHERECTOMY;  Surgeon: Cyril Mourning, MD;  Location: [facility];  Service: Gynecology;  Laterality: Bilateral;  . TONSILLECTOMY  92  . vaginal reconstructive surgery  2001    Patient Care Team: Gildardo Cranker, DO as PCP - General (Internal Medicine) Elsie Stain, MD as Attending Physician (Pulmonary Disease) Irene Shipper, MD as Consulting Physician (Gastroenterology) Chucky May, MD as Consulting Physician (Psychiatry) Adrian Prows, MD as Consulting Physician (Cardiology) Dian Queen, MD as Consulting Physician (Obstetrics and Gynecology) Nicholaus Bloom, MD (Anesthesiology)  Social History   Social History  . Marital status: Divorced    Spouse name: N/A  . Number of  children: 2  . Years of education: N/A   Occupational History  . nutrition [employer]   Social History Main Topics  . Smoking status: Former Smoker    Packs/day: 1.00    Years: 15.00    Types: Cigarettes    Quit date: 03/18/2004  . Smokeless tobacco: Never Used  . Alcohol use Yes     Comment: rarely  . Drug use: No  . Sexual activity: Not on file   Other Topics Concern  . Not on file   Social History Narrative   Diet: Regular   Caffeine:  Yes   Married- divorced, married in 2006   House: Yes, 2 persons   Pets: 1 dog   Current/Past profession: [employer] 2002-2013   Exercise: Yes, walking   Living Will: No    DNR: No    POA/HPOA: No            reports that she quit smoking about 12 years ago. Her smoking use included Cigarettes. She has a 15.00 pack-year smoking history. She has never used smokeless tobacco. She reports that she drinks alcohol. She reports that she does not use drugs.  Family History  Problem Relation Age of Onset  . High blood pressure Mother   . Hyperlipidemia Father     fathers side of family  . High blood pressure Father   . Hypertension Other     entire family on both sides  . Asthma Son   . Asthma Son   . Clotting disorder Maternal Uncle   . Clotting disorder Paternal Grandmother   . Heart disease Maternal Grandfather    Family Status  Relation Status  . Mother Alive  . Father Alive  . Sister Alive  . Son Alive  . Son Alive  . Other   . Son   . Son   . Maternal Uncle   . Paternal Grandmother   . Maternal Grandfather      No Known Allergies  Medications: Patient's Medications  New Prescriptions   No medications on file  Previous Medications   ALPRAZOLAM (XANAX) 1 MG TABLET    Take 1 mg by mouth at bedtime as needed.   ASPIRIN EC 81 MG TABLET    Take 81 mg by mouth daily.   BLOOD GLUCOSE MONITORING SUPPL (ONE TOUCH ULTRA SYSTEM KIT) W/DEVICE KIT    1 kit by Does not apply route once. Check blood sugar three times a week 2 hours after meals, call is BS >200 DX R73.03   BUPROPION (WELLBUTRIN SR) 150 MG 12 HR TABLET    Take 150 mg by mouth daily.   CALCIUM CARBONATE (OS-CAL) 600 MG TABS    Take 600 mg by mouth daily.   CHOLESTYRAMINE (QUESTRAN) 4 G PACKET    MIX AND DRINK 1 PACKET(4 GRAMS) BY MOUTH TWICE DAILY   CLONAZEPAM (KLONOPIN) 1 MG TABLET    Take 1 mg by mouth 4 (four) times daily.   COENZYME Q10 (COQ10) 100 MG CAPS    Take 100 mg by mouth daily.    ESOMEPRAZOLE  (NEXIUM) 40 MG CAPSULE    Take 1 capsule (40 mg total) by mouth daily.   EZETIMIBE (ZETIA) 10 MG TABLET    TAKE 1 TABLET BY MOUTH EVERY DAY   FLUOXETINE (PROZAC) 40 MG CAPSULE    Take 80 mg by mouth daily.   FUROSEMIDE (LASIX) 20 MG TABLET    Take 20 mg by mouth daily as needed for fluid.      GLUCOSE BLOOD (ONE TOUCH ULTRA TEST) TEST STRIP    Check blood sugar three times week 2 hours after meals, call is BS >200 DX R73.03   IMIPRAMINE (TOFRANIL) 50 MG TABLET    Take 50 mg by mouth at bedtime.   LAMOTRIGINE (LAMICTAL) 150 MG TABLET    Take 150 mg by mouth daily.   LOPERAMIDE (IMODIUM) 2 MG CAPSULE    Take by mouth as needed for diarrhea or loose stools.   LOSARTAN (COZAAR) 100 MG TABLET    Take one tablet by mouth once daily   METOPROLOL TARTRATE (LOPRESSOR) 25 MG TABLET    TAKE 1 TABLET BY MOUTH TWICE DAILY   MONTELUKAST (SINGULAIR) 10 MG TABLET    Take 10 mg by mouth at bedtime.   MULTIPLE VITAMINS-MINERALS (MULTIVITAMIN WITH MINERALS) TABLET    Take 1 tablet by mouth daily.    ONDANSETRON (ZOFRAN) 4 MG TABLET    TAKE 1 TABLET BY MOUTH EVERY 8 HOURS PRV FOR NAUSEA AND VOMITING   ONETOUCH DELICA LANCETS 33G MISC    Check blood sugar three times week 2 hours after meals, call is BS >200 DX R73.03   OXYCODONE-ACETAMINOPHEN (PERCOCET) 7.5-325 MG TABLET    Take 1 tablet by mouth every 6 (six) hours as needed for severe pain.   PHENAZOPYRIDINE (PYRIDIUM) 200 MG TABLET    Take 1 tablet (200 mg total) by mouth 3 (three) times daily as needed for pain.   ROSUVASTATIN (CRESTOR) 10 MG TABLET    Take 1 tablet (10 mg total) by mouth daily.   SERTRALINE (ZOLOFT) 100 MG TABLET    Take 100 mg by mouth daily.   SYNTHROID 150 MCG TABLET    TAKE 1 TABLET(150 MCG) BY MOUTH DAILY   VITAMIN C (ASCORBIC ACID) 500 MG TABLET    Take 500 mg by mouth daily.   VITAMIN D, ERGOCALCIFEROL, (DRISDOL) 50000 UNITS CAPS CAPSULE    TAKE 1 CAPSULE BY MOUTH EVERY 7 DAYS   VITAMIN E 400 UNIT CAPSULE    Take 400 Units by mouth daily.   Modified Medications   No medications on file  Discontinued Medications   CIPROFLOXACIN (CIPRO) 500 MG TABLET    Take 1 tablet (500 mg total) by mouth 2 (two) times daily.   FLUCONAZOLE (DIFLUCAN) 150 MG TABLET    Take 1 tab po today and repeat dose in 1 week   FLUVOXAMINE (LUVOX) 100 MG TABLET    Take 100 mg by mouth at bedtime.   OXYCODONE-ACETAMINOPHEN (PERCOCET/ROXICET) 5-325 MG PER TABLET    Take 1 tablet by mouth every 6 (six) hours as needed for moderate pain or severe pain.    Review of Systems  Unable to perform ROS: Psychiatric disorder    Vitals:   05/31/16 1048  BP: 110/80  Pulse: 67  Temp: 98.4 F (36.9 C)  TempSrc: Oral  SpO2: 97%  Weight: 225 lb 12.8 oz (102.4 kg)  Height: 5' 9" (1.753 m)   Body mass index is 33.34 kg/m.  Physical Exam  Constitutional: She appears well-developed and well-nourished.  HENT:  No ulcerations/lesions in mouth  Eyes: Pupils are equal, round, and reactive to light.  Neck: Neck supple. Carotid bruit is not present.  Cardiovascular: Normal rate, regular rhythm, normal heart sounds and intact distal pulses.  Exam reveals no gallop and no friction rub.   No murmur heard. No LE edema b/l. no calf TTP.   Pulmonary/Chest: Effort normal and breath sounds normal. No respiratory distress. She   has no wheezes. She has no rales.  CP not reproducible  Abdominal: Soft. Bowel sounds are normal. She exhibits no distension and no mass. There is no hepatomegaly. There is tenderness (suprapubic ). There is no rebound and no guarding.  Neurological: She is alert.  Skin: Skin is warm and dry. No rash noted.  Psychiatric: Her behavior is normal. Her mood appears anxious. Her speech is rapid and/or pressured.     Labs reviewed: Appointment on 05/29/2016  Component Date Value Ref Range Status  . Sodium 05/29/2016 138  135 - 146 mmol/L Final  . Potassium 05/29/2016 4.4  3.5 - 5.3 mmol/L Final  . Chloride 05/29/2016 102  98 - 110 mmol/L Final  . CO2  05/29/2016 27  20 - 31 mmol/L Final  . Glucose, Bld 05/29/2016 109* 65 - 99 mg/dL Final  . BUN 05/29/2016 17  7 - 25 mg/dL Final  . Creat 05/29/2016 0.96  0.50 - 1.10 mg/dL Final  . Total Bilirubin 05/29/2016 0.9  0.2 - 1.2 mg/dL Final  . Alkaline Phosphatase 05/29/2016 75  33 - 115 U/L Final  . AST 05/29/2016 28  10 - 30 U/L Final  . ALT 05/29/2016 22  6 - 29 U/L Final  . Total Protein 05/29/2016 6.7  6.1 - 8.1 g/dL Final  . Albumin 05/29/2016 4.1  3.6 - 5.1 g/dL Final  . Calcium 05/29/2016 9.5  8.6 - 10.2 mg/dL Final  . GFR, Est African American 05/29/2016 84  >=60 mL/min Final  . GFR, Est Non African American 05/29/2016 73  >=60 mL/min Final  . Cholesterol 05/29/2016 260* <200 mg/dL Final  . Triglycerides 05/29/2016 171* <150 mg/dL Final  . HDL 05/29/2016 49* >50 mg/dL Final  . Total CHOL/HDL Ratio 05/29/2016 5.3* <5.0 Ratio Final  . VLDL 05/29/2016 34* <30 mg/dL Final  . LDL Cholesterol 05/29/2016 177* <100 mg/dL Final  . Hgb A1c MFr Bld 05/29/2016 5.4  <5.7 % Final   Comment:   For the purpose of screening for the presence of diabetes:   <5.7%       Consistent with the absence of diabetes 5.7-6.4 %   Consistent with increased risk for diabetes (prediabetes) >=6.5 %     Consistent with diabetes   This assay result is consistent with a decreased risk of diabetes.   Currently, no consensus exists regarding use of hemoglobin A1c for diagnosis of diabetes in children.   According to American Diabetes Association (ADA) guidelines, hemoglobin A1c <7.0% represents optimal control in non-pregnant diabetic patients. Different metrics may apply to specific patient populations. Standards of Medical Care in Diabetes (ADA).     . Mean Plasma Glucose 05/29/2016 108  mg/dL Final  . TSH 05/29/2016 0.26* mIU/L Final   Comment:   Reference Range   > or = 20 Years  0.40-4.50   Pregnancy Range First trimester  0.26-2.66 Second trimester 0.55-2.73 Third trimester  0.43-2.91        No results found.   Assessment/Plan   ICD-9-CM ICD-10-CM   1. Dysuria 788.1 R30.0   2. Hyperlipidemia LDL goal <100 272.4 E78.5 Lipid Panel  3. Hypothyroidism due to acquired atrophy of thyroid 244.8 E03.4 TSH   246.8    4. Social anxiety disorder 300.23 F40.10   5. Chronic lumbar radiculopathy 724.4 M54.16   6. Vitamin D deficiency 268.9 E55.9 Vitamin D, 25-hydroxy  7. High risk medication use V58.69 Z79.899 CMP with eGFR   She was unable to tolerate higher doses of crestor (myalgias) and   thus no change made to statin tx. Encouraged strict low fat/low cholesterol meals  May need urology eval for persistent urinary tract issues  Continue current medications as ordered  Follow up with specialists as scheduled  Reduce calories in diet and increase exercise  Follow up in 4 mos for CPE/ECG. Check fasting labs prior to appt   S. , D. O., F. A. C. O. I.  [facility] [address] [place], [place] [address] [phone] Cell (Monday-Friday 8 AM - 5 PM) [phone] After 5 PM and follow prompts   

## 2016-06-12 DIAGNOSIS — M5416 Radiculopathy, lumbar region: Secondary | ICD-10-CM | POA: Diagnosis not present

## 2016-06-12 DIAGNOSIS — Z79891 Long term (current) use of opiate analgesic: Secondary | ICD-10-CM | POA: Diagnosis not present

## 2016-06-12 DIAGNOSIS — M4726 Other spondylosis with radiculopathy, lumbar region: Secondary | ICD-10-CM | POA: Diagnosis not present

## 2016-06-12 DIAGNOSIS — G894 Chronic pain syndrome: Secondary | ICD-10-CM | POA: Diagnosis not present

## 2016-06-20 DIAGNOSIS — Z0100 Encounter for examination of eyes and vision without abnormal findings: Secondary | ICD-10-CM | POA: Diagnosis not present

## 2016-06-23 ENCOUNTER — Other Ambulatory Visit: Payer: Self-pay | Admitting: Internal Medicine

## 2016-06-23 DIAGNOSIS — E785 Hyperlipidemia, unspecified: Secondary | ICD-10-CM

## 2016-06-23 DIAGNOSIS — E559 Vitamin D deficiency, unspecified: Secondary | ICD-10-CM

## 2016-06-24 ENCOUNTER — Other Ambulatory Visit: Payer: Self-pay | Admitting: Internal Medicine

## 2016-06-24 DIAGNOSIS — E039 Hypothyroidism, unspecified: Secondary | ICD-10-CM

## 2016-07-01 ENCOUNTER — Ambulatory Visit: Payer: Medicare HMO

## 2016-07-15 DIAGNOSIS — R69 Illness, unspecified: Secondary | ICD-10-CM | POA: Diagnosis not present

## 2016-07-22 ENCOUNTER — Ambulatory Visit: Payer: Medicare HMO

## 2016-07-25 ENCOUNTER — Other Ambulatory Visit: Payer: Self-pay | Admitting: Internal Medicine

## 2016-07-25 DIAGNOSIS — E559 Vitamin D deficiency, unspecified: Secondary | ICD-10-CM

## 2016-07-26 ENCOUNTER — Other Ambulatory Visit: Payer: Self-pay | Admitting: Internal Medicine

## 2016-07-26 DIAGNOSIS — E039 Hypothyroidism, unspecified: Secondary | ICD-10-CM

## 2016-08-07 ENCOUNTER — Telehealth: Payer: Self-pay | Admitting: Internal Medicine

## 2016-08-07 NOTE — Telephone Encounter (Signed)
Left msg asking pt to confirm AWV appt. VDM (DD)

## 2016-08-08 ENCOUNTER — Ambulatory Visit (INDEPENDENT_AMBULATORY_CARE_PROVIDER_SITE_OTHER): Payer: Medicare HMO

## 2016-08-08 VITALS — BP 100/80 | HR 75 | Temp 98.5°F | Ht 69.0 in | Wt 216.0 lb

## 2016-08-08 DIAGNOSIS — Z0000 Encounter for general adult medical examination without abnormal findings: Secondary | ICD-10-CM | POA: Diagnosis not present

## 2016-08-08 DIAGNOSIS — R69 Illness, unspecified: Secondary | ICD-10-CM | POA: Diagnosis not present

## 2016-08-08 NOTE — Progress Notes (Signed)
Quick Notes   Health Maintenance: HIV screen needed. Shingles vaccine due, pt educated.     Abnormal Screen: pt under 65, no MMSE. PHQ-12     Patient Concerns: slight R upper abdomen pain that she has ignored and has been putting off     Nurse Concerns: None

## 2016-08-08 NOTE — Patient Instructions (Signed)
Ms. Andrea [name] , Thank you for taking time to come for your Medicare Wellness Visit. I appreciate your ongoing commitment to your health goals. Please review the following plan we discussed and let me know if I can assist you in the future.   Screening recommendations/referrals: Colonoscopy due 2026 Mammogram due 2019 Bone Density due.  Recommended yearly ophthalmology/optometry visit for glaucoma screening and checkup Recommended yearly dental visit for hygiene and checkup  Vaccinations: Influenza vaccine up to date Pneumococcal vaccine needed at age 9. Tdap vaccine due 2025 Shingles vaccine if not had we give it here or your pharmacy can give it. Call us after you figure out insurance coverage and we will order it for you!   Advanced directives: Advance directive discussed with you today. I have provided a copy for you to complete at home and have notarized. Once this is complete please bring a copy in to our office so we can scan it into your chart.    Conditions/risks identified:None  Next appointment: Andrea [name] 5/16 @ 2:45pm  Preventive Care 40-64 Years, Female Preventive care refers to lifestyle choices and visits with your health care provider that can promote health and wellness. What does preventive care include?  A yearly physical exam. This is also called an annual well check.  Dental exams once or twice a year.  Routine eye exams. Ask your health care provider how often you should have your eyes checked.  Personal lifestyle choices, including:  Daily care of your teeth and gums.  Regular physical activity.  Eating a healthy diet.  Avoiding tobacco and drug use.  Limiting alcohol use.  Practicing safe sex.  Taking low-dose aspirin daily starting at age 34.  Taking vitamin and mineral supplements as recommended by your health care provider. What happens during an annual well check? The services and screenings done by your health care provider during your  annual well check will depend on your age, overall health, lifestyle risk factors, and family history of disease. Counseling  Your health care provider may ask you questions about your:  Alcohol use.  Tobacco use.  Drug use.  Emotional well-being.  Home and relationship well-being.  Sexual activity.  Eating habits.  Work and work Statistician.  Method of birth control.  Menstrual cycle.  Pregnancy history. Screening  You may have the following tests or measurements:  Height, weight, and BMI.  Blood pressure.  Lipid and cholesterol levels. These may be checked every 5 years, or more frequently if you are over 7 years old.  Skin check.  Lung cancer screening. You may have this screening every year starting at age 42 if you have a 30-pack-year history of smoking and currently smoke or have quit within the past 15 years.  Fecal occult blood test (FOBT) of the stool. You may have this test every year starting at age 4.  Flexible sigmoidoscopy or colonoscopy. You may have a sigmoidoscopy every 5 years or a colonoscopy every 10 years starting at age 66.  Hepatitis C blood test.  Hepatitis B blood test.  Sexually transmitted disease (STD) testing.  Diabetes screening. This is done by checking your blood sugar (glucose) after you have not eaten for a while (fasting). You may have this done every 1-3 years.  Mammogram. This may be done every 1-2 years. Talk to your health care provider about when you should start having regular mammograms. This may depend on whether you have a family history of breast cancer.  BRCA-related cancer screening. This may be  done if you have a family history of breast, ovarian, tubal, or peritoneal cancers.  Pelvic exam and Pap test. This may be done every 3 years starting at age 73. Starting at age 14, this may be done every 5 years if you have a Pap test in combination with an HPV test.  Bone density scan. This is done to screen for  osteoporosis. You may have this scan if you are at high risk for osteoporosis. Discuss your test results, treatment options, and if necessary, the need for more tests with your health care provider. Vaccines  Your health care provider may recommend certain vaccines, such as:  Influenza vaccine. This is recommended every year.  Tetanus, diphtheria, and acellular pertussis (Tdap, Td) vaccine. You may need a Td booster every 10 years.  Zoster vaccine. You may need this after age 65.  Pneumococcal 13-valent conjugate (PCV13) vaccine. You may need this if you have certain conditions and were not previously vaccinated.  Pneumococcal polysaccharide (PPSV23) vaccine. You may need one or two doses if you smoke cigarettes or if you have certain conditions. Talk to your health care provider about which screenings and vaccines you need and how often you need them. This information is not intended to replace advice given to you by your health care provider. Make sure you discuss any questions you have with your health care provider. Document Released: 06/02/2015 Document Revised: 01/24/2016 Document Reviewed: 03/07/2015 Elsevier Interactive Patient Education  2017 [place] Prevention in the Home Falls can cause injuries. They can happen to people of all ages. There are many things you can do to make your home safe and to help prevent falls. What can I do on the outside of my home?  Regularly fix the edges of walkways and driveways and fix any cracks.  Remove anything that might make you trip as you walk through a door, such as a raised step or threshold.  Trim any bushes or trees on the path to your home.  Use bright outdoor lighting.  Clear any walking paths of anything that might make someone trip, such as rocks or tools.  Regularly check to see if handrails are loose or broken. Make sure that both sides of any steps have handrails.  Any raised decks and porches should have  guardrails on the edges.  Have any leaves, snow, or ice cleared regularly.  Use sand or salt on walking paths during winter.  Clean up any spills in your garage right away. This includes oil or grease spills. What can I do in the bathroom?  Use night lights.  Install grab bars by the toilet and in the tub and shower. Do not use towel bars as grab bars.  Use non-skid mats or decals in the tub or shower.  If you need to sit down in the shower, use a plastic, non-slip stool.  Keep the floor dry. Clean up any water that spills on the floor as soon as it happens.  Remove soap buildup in the tub or shower regularly.  Attach bath mats securely with double-sided non-slip rug tape.  Do not have throw rugs and other things on the floor that can make you trip. What can I do in the bedroom?  Use night lights.  Make sure that you have a light by your bed that is easy to reach.  Do not use any sheets or blankets that are too big for your bed. They should not hang down onto the  floor.  Have a firm chair that has side arms. You can use this for support while you get dressed.  Do not have throw rugs and other things on the floor that can make you trip. What can I do in the kitchen?  Clean up any spills right away.  Avoid walking on wet floors.  Keep items that you use a lot in easy-to-reach places.  If you need to reach something above you, use a strong step stool that has a grab bar.  Keep electrical cords out of the way.  Do not use floor polish or wax that makes floors slippery. If you must use wax, use non-skid floor wax.  Do not have throw rugs and other things on the floor that can make you trip. What can I do with my stairs?  Do not leave any items on the stairs.  Make sure that there are handrails on both sides of the stairs and use them. Fix handrails that are broken or loose. Make sure that handrails are as long as the stairways.  Check any carpeting to make sure that  it is firmly attached to the stairs. Fix any carpet that is loose or worn.  Avoid having throw rugs at the top or bottom of the stairs. If you do have throw rugs, attach them to the floor with carpet tape.  Make sure that you have a light switch at the top of the stairs and the bottom of the stairs. If you do not have them, ask someone to add them for you. What else can I do to help prevent falls?  Wear shoes that:  Do not have high heels.  Have rubber bottoms.  Are comfortable and fit you well.  Are closed at the toe. Do not wear sandals.  If you use a stepladder:  Make sure that it is fully opened. Do not climb a closed stepladder.  Make sure that both sides of the stepladder are locked into place.  Ask someone to hold it for you, if possible.  Clearly mark and make sure that you can see:  Any grab bars or handrails.  First and last steps.  Where the edge of each step is.  Use tools that help you move around (mobility aids) if they are needed. These include:  Canes.  Walkers.  Scooters.  Crutches.  Turn on the lights when you go into a dark area. Replace any light bulbs as soon as they burn out.  Set up your furniture so you have a clear path. Avoid moving your furniture around.  If any of your floors are uneven, fix them.  If there are any pets around you, be aware of where they are.  Review your medicines with your doctor. Some medicines can make you feel dizzy. This can increase your chance of falling. Ask your doctor what other things that you can do to help prevent falls. This information is not intended to replace advice given to you by your health care provider. Make sure you discuss any questions you have with your health care provider. Document Released: 03/02/2009 Document Revised: 10/12/2015 Document Reviewed: 06/10/2014 Elsevier Interactive Patient Education  2017 [employer].

## 2016-08-08 NOTE — Progress Notes (Signed)
Subjective:   Andrea [name] is a 41 y.o. female who presents for an Initial Medicare Annual Wellness Visit.     Objective:    Today's Vitals   08/08/16 1433  BP: 100/80  Pulse: 75  Temp: 98.5 F (36.9 C)  TempSrc: Oral  SpO2: 94%  Weight: 216 lb (98 kg)  Height: _0  (1.753 m)   Body mass index is 31.9 kg/m.   Current Medications (verified) Outpatient Encounter Prescriptions as of 08/08/2016  Medication Sig  . ALPRAZolam (XANAX XR) 2 MG 24 hr tablet Take 2 mg by mouth 2 (two) times daily.  Marland Kitchen ALPRAZolam (XANAX) 1 MG tablet Take 1 mg by mouth at bedtime as needed.  Marland Kitchen aspirin EC 81 MG tablet Take 81 mg by mouth daily.  . Blood Glucose Monitoring Suppl (ONE TOUCH ULTRA SYSTEM KIT) w/Device KIT 1 kit by Does not apply route once. Check blood sugar three times a week 2 hours after meals, call is BS >200 DX R73.03  . buPROPion (WELLBUTRIN SR) 150 MG 12 hr tablet Take 150 mg by mouth daily.  . calcium carbonate (OS-CAL) 600 MG TABS Take 600 mg by mouth daily.  . cholestyramine (QUESTRAN) 4 g packet MIX AND DRINK 1 PACKET(4 GRAMS) BY MOUTH TWICE DAILY  . Coenzyme Q10 (COQ10) 100 MG CAPS Take 100 mg by mouth daily.   Marland Kitchen esomeprazole (NEXIUM) 40 MG capsule Take 1 capsule (40 mg total) by mouth daily.  Marland Kitchen ezetimibe (ZETIA) 10 MG tablet TAKE 1 TABLET BY MOUTH EVERY DAY  . FLUoxetine (PROZAC) 40 MG capsule Take 80 mg by mouth daily.  . furosemide (LASIX) 20 MG tablet Take 20 mg by mouth daily as needed for fluid.   Marland Kitchen glucose blood (ONE TOUCH ULTRA TEST) test strip Check blood sugar three times week 2 hours after meals, call is BS >200 DX R73.03  . lamoTRIgine (LAMICTAL) 150 MG tablet Take 150 mg by mouth daily.  Marland Kitchen loperamide (IMODIUM) 2 MG capsule Take by mouth as needed for diarrhea or loose stools.  Marland Kitchen losartan (COZAAR) 100 MG tablet Take one tablet by mouth once daily  . metoprolol tartrate (LOPRESSOR) 25 MG tablet TAKE 1 TABLET BY MOUTH TWICE DAILY  . mirtazapine (REMERON) 15 MG  tablet Take 15 mg by mouth at bedtime.  . montelukast (SINGULAIR) 10 MG tablet Take 10 mg by mouth at bedtime.  . Multiple Vitamins-Minerals (MULTIVITAMIN WITH MINERALS) tablet Take 1 tablet by mouth daily.   . ondansetron (ZOFRAN) 4 MG tablet TAKE 1 TABLET BY MOUTH EVERY 8 HOURS PRV FOR NAUSEA AND VOMITING  . ONETOUCH DELICA LANCETS 16X MISC Check blood sugar three times week 2 hours after meals, call is BS >200 DX R73.03  . oxyCODONE-acetaminophen (PERCOCET) 7.5-325 MG tablet Take 1 tablet by mouth every 6 (six) hours as needed for severe pain.  . phenazopyridine (PYRIDIUM) 200 MG tablet Take 1 tablet (200 mg total) by mouth 3 (three) times daily as needed for pain.  . rosuvastatin (CRESTOR) 10 MG tablet TAKE 1 TABLET(10 MG) BY MOUTH DAILY  . SYNTHROID 150 MCG tablet TAKE 1 TABLET(150 MCG) BY MOUTH DAILY  . vitamin C (ASCORBIC ACID) 500 MG tablet Take 500 mg by mouth daily.  . Vitamin D, Ergocalciferol, (DRISDOL) 50000 units CAPS capsule TAKE 1 CAPSULE BY MOUTH EVERY 7 DAYS  . vitamin E 400 UNIT capsule Take 400 Units by mouth daily.  . [DISCONTINUED] clonazePAM (KLONOPIN) 1 MG tablet Take 1 mg by mouth 4 (four)  times daily.  . [DISCONTINUED] imipramine (TOFRANIL) 50 MG tablet Take 50 mg by mouth at bedtime.  . [DISCONTINUED] sertraline (ZOLOFT) 100 MG tablet Take 100 mg by mouth daily.  . [DISCONTINUED] SYNTHROID 150 MCG tablet TAKE 1 TABLET(150 MCG) BY MOUTH DAILY  . [DISCONTINUED] SYNTHROID 150 MCG tablet TAKE 1 TABLET(150 MCG) BY MOUTH DAILY   No facility-administered encounter medications on file as of 08/08/2016.     Allergies (verified) Patient has no known allergies.   History: Past Medical History:  Diagnosis Date  . Anxiety   . Arthritis    ruptured lumbar disc-careful with positioning  . Blood dyscrasia    protein s deficiency-no treatment since 2006  . Depression   . GERD (gastroesophageal reflux disease)   . Headache(784.0)   . Hemorrhoids   . High cholesterol   .  Hyperlipemia   . Hypertension   . Hypothyroidism   . IBS (irritable bowel syndrome)   . MVP (mitral valve prolapse)    echo per dr Dagmar Hait  . Protein S deficiency ([place]) 2003   dvt/pulmonary embolus-on coumadin for 6 months then off-Sees [facility]  . Pulmonary embolism ([place])   . PVC (premature ventricular contraction)    Past Surgical History:  Procedure Laterality Date  . CESAREAN SECTION  2006  . CHOLECYSTECTOMY N/A 11/03/2014   Procedure: LAPAROSCOPIC CHOLECYSTECTOMY WITH INTRAOPERATIVE CHOLANGIOGRAM;  Surgeon: Georganna Skeans, MD;  Location: [facility];  Service: General;  Laterality: N/A;  . LAPAROSCOPIC ASSISTED VAGINAL HYSTERECTOMY  03/27/2011   Procedure: LAPAROSCOPIC ASSISTED VAGINAL HYSTERECTOMY;  Surgeon: Cyril Mourning, MD;  Location: [facility];  Service: Gynecology;  Laterality: N/A;  . SALPINGOOPHORECTOMY  03/27/2011   Procedure: SALPINGO OOPHERECTOMY;  Surgeon: Cyril Mourning, MD;  Location: [facility];  Service: Gynecology;  Laterality: Bilateral;  . TONSILLECTOMY  92  . vaginal reconstructive surgery  2001   Family History  Problem Relation Age of Onset  . High blood pressure Mother   . Hyperlipidemia Father     fathers side of family  . High blood pressure Father   . Hypertension Other     entire family on both sides  . Asthma Son   . Asthma Son   . Clotting disorder Maternal Uncle   . Clotting disorder Paternal Grandmother   . Heart disease Maternal Grandfather    Social History   Occupational History  . nutrition [place] History Main Topics  . Smoking status: Former Smoker    Packs/day: 1.00    Years: 15.00    Types: Cigarettes    Quit date: 03/18/2004  . Smokeless tobacco: Never Used  . Alcohol use Yes     Comment: rarely  . Drug use: No  . Sexual activity: Not on file    Tobacco Counseling Counseling given: Not Answered   Activities of Daily Living In your present state of health, do you have any difficulty  performing the following activities: 08/08/2016  Hearing? N  Vision? N  Difficulty concentrating or making decisions? N  Walking or climbing stairs? N  Dressing or bathing? N  Doing errands, shopping? N  Preparing Food and eating ? N  Using the Toilet? N  In the past six months, have you accidently leaked urine? Y  Do you have problems with loss of bowel control? N  Managing your Medications? N  Managing your Finances? N  Housekeeping or managing your Housekeeping? N  Some recent data might be hidden    Immunizations and  Health Maintenance Immunization History  Administered Date(s) Administered  . Influenza Split 02/18/2012  . Influenza,inj,Quad PF,36+ Mos 02/01/2015, 02/23/2016  . Tdap 01/07/2014   Health Maintenance Due  Topic Date Due  . HIV Screening  03/12/1989    Patient Care Team: Gildardo Cranker, DO as PCP - General (Internal Medicine) Elsie Stain, MD as Attending Physician (Pulmonary Disease) Irene Shipper, MD as Consulting Physician (Gastroenterology) Chucky May, MD as Consulting Physician (Psychiatry) Adrian Prows, MD as Consulting Physician (Cardiology) Dian Queen, MD as Consulting Physician (Obstetrics and Gynecology) Nicholaus Bloom, MD (Anesthesiology)  Indicate any recent Medical Services you may have received from other than [facility] providers in the past year (date may be approximate).     Assessment:   This is a routine wellness examination for Andrea [name].   Hearing/Vision screen No exam data present  Dietary issues and exercise activities discussed: Current Exercise Habits: Home exercise routine, Type of exercise: walking, Time (Minutes): 30, Frequency (Times/Week): 5, Weekly Exercise (Minutes/Week): 150  Goals    None     Depression Screen PHQ 2/9 Scores 08/08/2016 12/15/2015 09/07/2014  PHQ - 2 Score 6 0 0  PHQ- 9 Score 12 - -    Fall Risk Fall Risk  08/08/2016 05/31/2016 02/23/2016 12/15/2015 10/27/2015  Falls in the past year? Yes Yes Yes Yes  Yes  Number falls in past yr: 2 or more 2 or more 2 or more 2 or more 2 or more  Injury with Fall? Yes No Yes No No    Cognitive Function: Within normal limits        Screening Tests Health Maintenance  Topic Date Due  . HIV Screening  03/12/1989  . PAP SMEAR  02/14/2018  . TETANUS/TDAP  01/08/2024  . INFLUENZA VACCINE  Completed      Plan:  I have personally reviewed and addressed the Medicare Annual Wellness questionnaire and have noted the following in the patient's chart:  A. Medical and social history B. Use of alcohol, tobacco or illicit drugs  C. Current medications and supplements D. Functional ability and status E.  Nutritional status F.  Physical activity G. Advance directives H. List of other physicians I.  Hospitalizations, surgeries, and ER visits in previous 12 months J.  [place] to include hearing, vision, cognitive, depression L. Referrals and appointments - none  In addition, I have reviewed and discussed with patient certain preventive protocols, quality metrics, and best practice recommendations. A written personalized care plan for preventive services as well as general preventive health recommendations were provided to patient.  See attached scanned questionnaire for additional information.   Signed,   Rich Reining, RN Nurse Health Advisor  I reviewed health advisors note, was available for consultation and agree with documentation and plan.  Carlos American. Harle Battiest  [facility] [phone] 8 am - 5 pm) [phone] (after hours)

## 2016-08-12 ENCOUNTER — Telehealth: Payer: Self-pay | Admitting: *Deleted

## 2016-08-12 NOTE — Telephone Encounter (Signed)
Thank you :)

## 2016-08-12 NOTE — Telephone Encounter (Signed)
-----   Message from Lauree Chandler, NP sent at 08/12/2016  3:15 PM EDT ----- Please call pt and see if she would like to be seen sooner due to right upper abdominal discomfort that she mentioned during her AWV

## 2016-08-12 NOTE — Telephone Encounter (Signed)
I called patient as Andrea Mustache, NP requested regarding Abdominal Discomfort and offered patient an appointment to be seen and patient stated that she only wants to see Dr. Eulas Post. She stated that Dr. Eulas Post is already aware of the discomfort and watching it. She stated that she will call with any new symptoms or if the pain worsens.

## 2016-09-02 ENCOUNTER — Other Ambulatory Visit: Payer: Self-pay | Admitting: Internal Medicine

## 2016-09-02 DIAGNOSIS — R69 Illness, unspecified: Secondary | ICD-10-CM | POA: Diagnosis not present

## 2016-09-02 DIAGNOSIS — E559 Vitamin D deficiency, unspecified: Secondary | ICD-10-CM

## 2016-09-02 DIAGNOSIS — E039 Hypothyroidism, unspecified: Secondary | ICD-10-CM

## 2016-09-09 DIAGNOSIS — G894 Chronic pain syndrome: Secondary | ICD-10-CM | POA: Diagnosis not present

## 2016-09-09 DIAGNOSIS — M5416 Radiculopathy, lumbar region: Secondary | ICD-10-CM | POA: Diagnosis not present

## 2016-09-09 DIAGNOSIS — M4726 Other spondylosis with radiculopathy, lumbar region: Secondary | ICD-10-CM | POA: Diagnosis not present

## 2016-09-09 DIAGNOSIS — Z79891 Long term (current) use of opiate analgesic: Secondary | ICD-10-CM | POA: Diagnosis not present

## 2016-09-30 ENCOUNTER — Other Ambulatory Visit: Payer: Medicare HMO

## 2016-09-30 DIAGNOSIS — E034 Atrophy of thyroid (acquired): Secondary | ICD-10-CM | POA: Diagnosis not present

## 2016-09-30 DIAGNOSIS — E559 Vitamin D deficiency, unspecified: Secondary | ICD-10-CM | POA: Diagnosis not present

## 2016-09-30 DIAGNOSIS — E785 Hyperlipidemia, unspecified: Secondary | ICD-10-CM

## 2016-09-30 DIAGNOSIS — Z79899 Other long term (current) drug therapy: Secondary | ICD-10-CM | POA: Diagnosis not present

## 2016-09-30 LAB — COMPLETE METABOLIC PANEL WITH GFR
ALT: 19 U/L (ref 6–29)
AST: 17 U/L (ref 10–30)
Albumin: 4.1 g/dL (ref 3.6–5.1)
Alkaline Phosphatase: 60 U/L (ref 33–115)
BUN: 11 mg/dL (ref 7–25)
CO2: 28 mmol/L (ref 20–31)
Calcium: 9.2 mg/dL (ref 8.6–10.2)
Chloride: 103 mmol/L (ref 98–110)
Creat: 1.01 mg/dL (ref 0.50–1.10)
GFR, Est African American: 79 mL/min (ref >=60)
GFR, Est Non African American: 69 mL/min (ref >=60)
Glucose, Bld: 96 mg/dL (ref 65–99)
Potassium: 4.4 mmol/L (ref 3.5–5.3)
Sodium: 139 mmol/L (ref 135–146)
Total Bilirubin: 0.5 mg/dL (ref 0.2–1.2)
Total Protein: 6.3 g/dL (ref 6.1–8.1)

## 2016-09-30 LAB — LIPID PANEL
Cholesterol: 187 mg/dL (ref <200)
HDL: 43 mg/dL — ABNORMAL LOW (ref >50)
LDL Cholesterol: 112 mg/dL — ABNORMAL HIGH (ref <100)
Total CHOL/HDL Ratio: 4.3 Ratio (ref <5.0)
Triglycerides: 161 mg/dL — ABNORMAL HIGH (ref <150)
VLDL: 32 mg/dL — ABNORMAL HIGH (ref <30)

## 2016-09-30 LAB — TSH: TSH: 0.02 mIU/L — ABNORMAL LOW

## 2016-10-01 LAB — VITAMIN D 25 HYDROXY (VIT D DEFICIENCY, FRACTURES): Vit D, 25-Hydroxy: 36 ng/mL (ref 30–100)

## 2016-10-02 ENCOUNTER — Encounter: Payer: Self-pay | Admitting: Internal Medicine

## 2016-10-02 ENCOUNTER — Ambulatory Visit (INDEPENDENT_AMBULATORY_CARE_PROVIDER_SITE_OTHER): Payer: Medicare HMO | Admitting: Internal Medicine

## 2016-10-02 VITALS — BP 124/78 | HR 69 | Temp 98.8°F | Ht 69.0 in | Wt 213.0 lb

## 2016-10-02 DIAGNOSIS — Z0000 Encounter for general adult medical examination without abnormal findings: Secondary | ICD-10-CM

## 2016-10-02 DIAGNOSIS — B009 Herpesviral infection, unspecified: Secondary | ICD-10-CM | POA: Diagnosis not present

## 2016-10-02 DIAGNOSIS — I10 Essential (primary) hypertension: Secondary | ICD-10-CM

## 2016-10-02 DIAGNOSIS — E034 Atrophy of thyroid (acquired): Secondary | ICD-10-CM

## 2016-10-02 DIAGNOSIS — E559 Vitamin D deficiency, unspecified: Secondary | ICD-10-CM

## 2016-10-02 MED ORDER — VITAMIN D (ERGOCALCIFEROL) 1.25 MG (50000 UNIT) PO CAPS: ORAL_CAPSULE | ORAL | 6 refills | Status: DC

## 2016-10-02 MED ORDER — VALACYCLOVIR HCL 1 G PO TABS: ORAL_TABLET | ORAL | 3 refills | Status: DC

## 2016-10-02 MED ORDER — SYNTHROID 125 MCG PO TABS: 125.0000 ug | ORAL_TABLET | Freq: Every day | ORAL | 6 refills | Status: DC

## 2016-10-02 NOTE — Patient Instructions (Addendum)
Take valtrex 1 tab 3 times daily x 7 days for current outbreak then use as needed per bottle instructions  Monitor abdominal pain - if worse with fatty foods then likely due to absence of gallbladder and avoid those foods that trigger symptoms  Continue other medications as ordered  Follow up with specialists as scheduled  Follow up in 3 mos for cholesterol, GERD . Please schedule lab for 4 weeks   Keeping You Healthy  Get These Tests 1. Blood Pressure- Have your blood pressure checked once a year by your health care provider.  Normal blood pressure is 120/80. 2. Weight- Have your body mass index (BMI) calculated to screen for obesity.  BMI is measure of body fat based on height and weight.  You can also calculate your own BMI at GravelBags.it. 3. Cholesterol- Have your cholesterol checked every 5 years starting at age 79 then yearly starting at age 41. 12. Chlamydia, HIV, and other sexually transmitted diseases- Get screened every year until age 38, then within three months of each new sexual provider. 5. Pap Test - Every 1-5 years; discuss with your health care provider. 6. Mammogram- Every 1-2 years starting at age 62--50  Take these medicines  Calcium with Vitamin D-Your body needs 1200 mg of Calcium each day and [phone] IU of Vitamin D daily.  Your body can only absorb 500 mg of Calcium at a time so Calcium must be taken in 2 or 3 divided doses throughout the day.  Multivitamin with folic acid- Once daily if it is possible for you to become pregnant.  Get these Immunizations  Gardasil-Series of three doses; prevents HPV related illness such as genital warts and cervical cancer.  Menactra-Single dose; prevents meningitis.  Tetanus shot- Every 10 years.  Flu shot-Every year.  Take these steps 1. Do not smoke-Your healthcare provider can help you quit.  For tips on how to quit go to www.smokefree.gov or call 1-800 QUITNOW. 2. Be physically active- Exercise 5 days  a week for at least 30 minutes.  If you are not already physically active, start slow and gradually work up to 30 minutes of moderate physical activity.  Examples of moderate activity include walking briskly, dancing, swimming, bicycling, etc. 3. Breast Cancer- A self breast exam every month is important for early detection of breast cancer.  For more information and instruction on self breast exams, ask your healthcare provider or https://www.patel.info/. 4. Eat a healthy diet- Eat a variety of healthy foods such as fruits, vegetables, whole grains, low fat milk, low fat cheeses, yogurt, lean meats, poultry and fish, beans, nuts, tofu, etc.  For more information go to www. Thenutritionsource.org 5. Drink alcohol in moderation- Limit alcohol intake to one drink or less per day. Never drink and drive. 6. Depression- Your emotional health is as important as your physical health.  If you're feeling down or losing interest in things you normally enjoy please talk to your healthcare provider about being screened for depression. 7. Dental visit- Brush and floss your teeth twice daily; visit your dentist twice a year. 8. Eye doctor- Get an eye exam at least every 2 years. 9. Helmet use- Always wear a helmet when riding a bicycle, motorcycle, rollerblading or skateboarding. 79. Safe sex- If you may be exposed to sexually transmitted infections, use a condom. 11. Seat belts- Seat belts can save your live; always wear one. 12. Smoke/Carbon Monoxide detectors- These detectors need to be installed on the appropriate level of your home. Replace batteries  at least once a year. 13. Skin cancer- When out in the sun please cover up and use sunscreen 15 SPF or higher. 14. Violence- If anyone is threatening or hurting you, please tell your healthcare provider.

## 2016-10-02 NOTE — Progress Notes (Signed)
Patient ID: Andrea [name], female   DOB: [birth year], 41 y.o.   MRN: [id number]   Location:   [facility]   Place of Service:   OFFICE Provider: DR Arletha Grippe  Patient Care Team: Gildardo Cranker, DO as PCP - General (Internal Medicine) Elsie Stain, MD as Attending Physician (Pulmonary Disease) Irene Shipper, MD as Consulting Physician (Gastroenterology) Chucky May, MD as Consulting Physician (Psychiatry) Adrian Prows, MD as Consulting Physician (Cardiology) Dian Queen, MD as Consulting Physician (Obstetrics and Gynecology) Nicholaus Bloom, MD (Anesthesiology)  Extended Emergency Contact Information Primary Emergency Contact: Crews,Debbie Address: [address]          [place], [place] [address] [place] Phone: [phone] Relation: Mother Secondary Emergency Contact: Cosner,[name]          [place], [place] [address] [place] Phone: [phone] Work Phone: [phone] Relation: Father  Code Status: FULL Goals of Care: Advanced Directive information Advanced Directives 10/02/2016  Does Patient Have a Medical Advance Directive? No  Would patient like information on creating a medical advance directive? No - Patient declined  Pre-existing out of facility DNR order (yellow form or pink MOST form) -     Chief Complaint  Patient presents with  . Annual Exam    Extended Visit    HPI: Patient is a 41 y.o. female seen in today for an annual wellness exam. She had her AWV on 08/08/16 and reported abdominal pain at RUQ to [facility] and desire for Shingles vaccine. She had an HIV test earlier thi year that was neg. She had RUQ [place] that showed fatty liver.  Obesity - she has lost 3 lbs since last OV (total 29 lbs in last several mos). LFTs nml.  Hypothyroidsm - slightly overactive. She takes brand synthroid 138mg daily. TSH 0.02  BP stable at home on lasix, losartan and lopressor. She takes ASA daily  Hyperlipidemia - stale on  zetia/crestor. LDL 112; TG 161; Tchol 187  Vit D defic - improved on supplement. Vit D 36  Allergic rhinitis - stable on prn singulair  Prediabetes - A1c 5.4%  Diarrhea/GERD/IBS - takes qSweden Acid reflux stable on nexium. Takes prn zofran  She sees psych Dr KToy Carefor OCD/social anxiety/depression. Takes fluoxetine, lamictal, wellbutrin sr. Clonazepam changed to alprazolam XR BID.  She has an autistic son. Sertraline stopped due to ADRs.  Followed by Dr KHarlene Ramus She discovered that her ex husband is IVDA and shared needles with Hep C and HIV (+) individual. Last sexual contact in 2010. [facility] Neg Hepatitis panel. RUQ [place]owed fatty liver  She is a poor historian due to psych dx. Hx obtained from chart    [facility] 10/02/2016 08/08/2016 12/15/2015 09/07/2014  Decreased Interest 3 3 0 0  Down, Depressed, Hopeless 3 3 0 0  PHQ - 2 Score 6 6 0 0  Altered sleeping - 0 - -  Tired, decreased energy - 0 - -  Change in appetite - 0 - -  Feeling bad or failure about yourself  - 3 - -  Trouble concentrating - 3 - -  Moving slowly or fidgety/restless - 0 - -  Suicidal thoughts - 0 - -  PHQ-9 Score - 12 - -  Difficult doing work/chores - Somewhat difficult - -    Fall Risk  10/02/2016 08/08/2016 05/31/2016 02/23/2016 12/15/2015  Falls in the past year? Yes  Yes Yes Yes Yes  Number falls in past yr: 2 or more 2 or more 2 or more 2 or more 2 or more  Injury with Fall? Yes Yes No Yes No   No flowsheet data found.   Health Maintenance  Topic Date Due  . HIV Screening  03/12/1989  . INFLUENZA VACCINE  12/18/2016  . PAP SMEAR  02/14/2018  . TETANUS/TDAP  01/08/2024    AWV NOTE FROM 08/08/16 REVIEWED  Past Medical History:  Diagnosis Date  . Anxiety   . Arthritis    ruptured lumbar disc-careful with positioning  . Blood dyscrasia    protein s deficiency-no treatment since 2006  . Depression   . GERD (gastroesophageal reflux disease)    . Headache(784.0)   . Hemorrhoids   . High cholesterol   . Hyperlipemia   . Hypertension   . Hypothyroidism   . IBS (irritable bowel syndrome)   . MVP (mitral valve prolapse)    echo per dr Dagmar Hait  . Protein S deficiency ([place]) 2003   dvt/pulmonary embolus-on coumadin for 6 months then off-Sees [facility]  . Pulmonary embolism ([place])   . PVC (premature ventricular contraction)     Past Surgical History:  Procedure Laterality Date  . CESAREAN SECTION  2006  . CHOLECYSTECTOMY N/A 11/03/2014   Procedure: LAPAROSCOPIC CHOLECYSTECTOMY WITH INTRAOPERATIVE CHOLANGIOGRAM;  Surgeon: Georganna Skeans, MD;  Location: [place];  Service: General;  Laterality: N/A;  . LAPAROSCOPIC ASSISTED VAGINAL HYSTERECTOMY  03/27/2011   Procedure: LAPAROSCOPIC ASSISTED VAGINAL HYSTERECTOMY;  Surgeon: Cyril Mourning, MD;  Location: [facility];  Service: Gynecology;  Laterality: N/A;  . SALPINGOOPHORECTOMY  03/27/2011   Procedure: SALPINGO OOPHERECTOMY;  Surgeon: Cyril Mourning, MD;  Location: [facility];  Service: Gynecology;  Laterality: Bilateral;  . TONSILLECTOMY  92  . vaginal reconstructive surgery  2001    Family History  Problem Relation Age of Onset  . High blood pressure Mother   . Hyperlipidemia Father        fathers side of family  . High blood pressure Father   . Hypertension Other        entire family on both sides  . Asthma Son   . Asthma Son   . Clotting disorder Maternal Uncle   . Clotting disorder Paternal Grandmother   . Heart disease Maternal Grandfather     Social History   Social History  . Marital status: Divorced    Spouse name: N/A  . Number of children: 2  . Years of education: N/A   Occupational History  . nutrition [place] History Main Topics  . Smoking status: Former Smoker    Packs/day: 1.00    Years: 15.00    Types: Cigarettes    Quit date: 03/18/2004  . Smokeless tobacco: Never Used  . Alcohol use Yes     Comment: rarely  . Drug  use: No  . Sexual activity: Not Asked   Other Topics Concern  . None   Social History Narrative   Diet: Regular   Caffeine: Yes   Married- divorced, married in 2006   House: Yes, 2 persons   Pets: 1 dog   Current/Past profession: [employer] 2002-2013   Exercise: Yes, walking   Living Will: No    DNR: No    POA/HPOA: No           reports that she quit smoking about 12 years ago. Her smoking use included Cigarettes. She has a  15.00 pack-year smoking history. She has never used smokeless tobacco. She reports that she drinks alcohol. She reports that she does not use drugs.   No Known Allergies  Allergies as of 10/02/2016   No Known Allergies     Medication List       Accurate as of 10/02/16  3:11 PM. Always use your most recent med list.          ALPRAZolam 2 MG 24 hr tablet Commonly known as:  XANAX XR Take 2 mg by mouth 2 (two) times daily.   ALPRAZolam 1 MG tablet Commonly known as:  XANAX Take 1 mg by mouth at bedtime as needed.   aspirin EC 81 MG tablet Take 81 mg by mouth daily.   buPROPion 150 MG 12 hr tablet Commonly known as:  WELLBUTRIN SR Take 150 mg by mouth daily.   calcium carbonate 600 MG Tabs tablet Commonly known as:  OS-CAL Take 600 mg by mouth daily.   cholestyramine 4 g packet Commonly known as:  QUESTRAN MIX AND DRINK 1 PACKET(4 GRAMS) BY MOUTH TWICE DAILY   CoQ10 100 MG Caps Take 100 mg by mouth daily.   esomeprazole 40 MG capsule Commonly known as:  NEXIUM Take 1 capsule (40 mg total) by mouth daily.   ezetimibe 10 MG tablet Commonly known as:  ZETIA TAKE 1 TABLET BY MOUTH EVERY DAY   FLUoxetine 40 MG capsule Commonly known as:  PROZAC Take 80 mg by mouth daily.   furosemide 20 MG tablet Commonly known as:  LASIX Take 20 mg by mouth daily as needed for fluid.   glucose blood test strip Commonly known as:  ONE TOUCH ULTRA TEST Check blood sugar three times week 2 hours after meals, call is BS >200 DX R73.03     lamoTRIgine 150 MG tablet Commonly known as:  LAMICTAL Take 150 mg by mouth daily.   loperamide 2 MG capsule Commonly known as:  IMODIUM Take by mouth as needed for diarrhea or loose stools.   losartan 100 MG tablet Commonly known as:  COZAAR Take one tablet by mouth once daily   metoprolol tartrate 25 MG tablet Commonly known as:  LOPRESSOR TAKE 1 TABLET BY MOUTH TWICE DAILY   mirtazapine 15 MG tablet Commonly known as:  REMERON Take 15 mg by mouth at bedtime.   montelukast 10 MG tablet Commonly known as:  SINGULAIR Take 10 mg by mouth at bedtime.   multivitamin with minerals tablet Take 1 tablet by mouth daily.   ondansetron 4 MG tablet Commonly known as:  ZOFRAN TAKE 1 TABLET BY MOUTH EVERY 8 HOURS PRV FOR NAUSEA AND VOMITING   ONE TOUCH ULTRA SYSTEM KIT w/Device Kit 1 kit by Does not apply route once. Check blood sugar three times a week 2 hours after meals, call is BS >200 DX D66.44   ONETOUCH DELICA LANCETS 03K Misc Check blood sugar three times week 2 hours after meals, call is BS >200 DX R73.03   oxyCODONE-acetaminophen 7.5-325 MG tablet Commonly known as:  PERCOCET Take 1 tablet by mouth every 6 (six) hours as needed for severe pain.   phenazopyridine 200 MG tablet Commonly known as:  PYRIDIUM Take 1 tablet (200 mg total) by mouth 3 (three) times daily as needed for pain.   rosuvastatin 10 MG tablet Commonly known as:  CRESTOR TAKE 1 TABLET(10 MG) BY MOUTH DAILY   SYNTHROID 150 MCG tablet Generic drug:  levothyroxine TAKE 1 TABLET(150 MCG) BY MOUTH DAILY  vitamin C 500 MG tablet Commonly known as:  ASCORBIC ACID Take 500 mg by mouth daily.   Vitamin D (Ergocalciferol) 50000 units Caps capsule Commonly known as:  DRISDOL TAKE 1 CAPSULE BY MOUTH EVERY 7 DAYS   vitamin E 400 UNIT capsule Take 400 Units by mouth daily.        Review of Systems:  Review of Systems  Unable to perform ROS: Psychiatric disorder    Physical Exam: Vitals:    10/02/16 1452  BP: 124/78  Pulse: 69  Temp: 98.8 F (37.1 C)  TempSrc: Oral  SpO2: 96%  Weight: 213 lb (96.6 kg)  Height: 5' 9"  (1.753 m)   Body mass index is 31.45 kg/m. Physical Exam  Constitutional: She is oriented to person, place, and time. She appears well-developed and well-nourished. No distress.  HENT:  Head: Normocephalic and atraumatic.  Right Ear: Hearing, tympanic membrane, external ear and ear canal normal.  Left Ear: Hearing, tympanic membrane, external ear and ear canal normal.  Mouth/Throat: Uvula is midline, oropharynx is clear and moist and mucous membranes are normal. She does not have dentures. Oral lesions present. No posterior oropharyngeal erythema.    (+)  Clustered Vesicles with red base on hard palate. No secondary signs of infection  Eyes: Conjunctivae, EOM and lids are normal. Pupils are equal, round, and reactive to light. No scleral icterus.  Neck: Trachea normal and normal range of motion. Neck supple. Carotid bruit is not present. No thyroid mass and no thyromegaly present.  Cardiovascular: Normal rate, regular rhythm, normal heart sounds and intact distal pulses.  Exam reveals no gallop and no friction rub.   No murmur heard. No LE edema b/l. no calf TTP.   Pulmonary/Chest: Effort normal and breath sounds normal. No respiratory distress. She has no wheezes. She has no rhonchi. She has no rales. She exhibits no tenderness. Right breast exhibits no inverted nipple, no mass, no nipple discharge, no skin change and no tenderness. Left breast exhibits no inverted nipple, no mass, no nipple discharge, no skin change and no tenderness. Breasts are symmetrical.  CP not reproducible  Abdominal: Soft. Normal appearance, normal aorta and bowel sounds are normal. She exhibits no distension, no pulsatile midline mass and no mass. There is no hepatosplenomegaly or hepatomegaly. There is no tenderness. There is no rigidity, no rebound and no guarding. No hernia.    Musculoskeletal: Normal range of motion. She exhibits edema.  Lymphadenopathy:       Head (right side): No posterior auricular adenopathy present.       Head (left side): No posterior auricular adenopathy present.    She has no cervical adenopathy.       Right: No supraclavicular adenopathy present.       Left: No supraclavicular adenopathy present.  Neurological: She is alert and oriented to person, place, and time. She has normal strength and normal reflexes. No cranial nerve deficit. Gait normal.  Skin: Skin is warm, dry and intact. No rash noted. Nails show no clubbing.  Psychiatric: Her behavior is normal. Thought content normal. Her mood appears anxious. Her speech is rapid and/or pressured. Cognition and memory are normal.    Labs reviewed: Basic Metabolic Panel:  Recent Labs  10/27/15 1244 05/29/16 1016 09/30/16 0859  NA 139 138 139  K 5.2 4.4 4.4  CL 97 102 103  CO2 26 27 28   GLUCOSE 101* 109* 96  BUN 12 17 11   CREATININE 0.77 0.96 1.01  CALCIUM 9.7 9.5 9.2  TSH 1.430 0.26* 0.02*   Liver Function Tests:  Recent Labs  10/27/15 1244 05/29/16 1016 09/30/16 0859  AST 47* 28 17  ALT 54* 22 19  ALKPHOS 91 75 60  BILITOT 0.4 0.9 0.5  PROT 6.9 6.7 6.3  ALBUMIN 4.6 4.1 4.1   No results for input(s): LIPASE, AMYLASE in the last 8760 hours. No results for input(s): AMMONIA in the last 8760 hours. CBC: No results for input(s): WBC, NEUTROABS, HGB, HCT, MCV, PLT in the last 8760 hours. Lipid Panel:  Recent Labs  05/29/16 1016 09/30/16 0859  CHOL 260* 187  HDL 49* 43*  LDLCALC 177* 112*  TRIG 171* 161*  CHOLHDL 5.3* 4.3   Lab Results  Component Value Date   HGBA1C 5.4 05/29/2016    Procedures: No results found. ECG OBTAINED AND REVIEWED BY MYSELF: NSR @ 66 bpm, nml axis, borderline nml QTc, poor R wave progression. No acute ischemic changes. No significant changes since 07/2015  Assessment/Plan    ICD-9-CM ICD-10-CM   1. Well adult exam V70.0 Z00.00    2. Hypothyroidism due to acquired atrophy of thyroid 244.8 E03.4 TSH   246.8  SYNTHROID 125 MCG tablet   slightly overactive  3. Essential hypertension 401.9 I10 EKG 12-Lead  4. Vitamin D deficiency 268.9 E55.9 Vitamin D, Ergocalciferol, (DRISDOL) 50000 units CAPS capsule  5. HSV (herpes simplex virus) infection 054.9 B00.9 valACYclovir (VALTREX) 1000 MG tablet    Take valtrex 1 tab 3 times daily x 7 days for current outbreak then use as needed per bottle instructions  Monitor abdominal pain - if worse with fatty foods then likely due to absence of gallbladder and avoid those foods that trigger symptoms  Continue other medications as ordered  Follow up with specialists as scheduled  Follow up in 3 mos for cholesterol, GERD . Please schedule lab for 4 weeks  Handout "keeping you healthy" given  Viraat Vanpatten S. Perlie Gold  [facility] [address] [place], [place] [address] [phone] Cell (Monday-Friday 8 AM - 5 PM) [phone] After 5 PM and follow prompts

## 2016-10-07 ENCOUNTER — Other Ambulatory Visit: Payer: Self-pay | Admitting: Internal Medicine

## 2016-10-07 DIAGNOSIS — E785 Hyperlipidemia, unspecified: Secondary | ICD-10-CM

## 2016-10-31 ENCOUNTER — Other Ambulatory Visit: Payer: Self-pay | Admitting: Internal Medicine

## 2016-10-31 DIAGNOSIS — E785 Hyperlipidemia, unspecified: Secondary | ICD-10-CM

## 2016-10-31 DIAGNOSIS — R69 Illness, unspecified: Secondary | ICD-10-CM | POA: Diagnosis not present

## 2016-11-01 ENCOUNTER — Other Ambulatory Visit: Payer: Medicare HMO

## 2016-11-04 DIAGNOSIS — M5416 Radiculopathy, lumbar region: Secondary | ICD-10-CM | POA: Diagnosis not present

## 2016-11-04 DIAGNOSIS — Z79891 Long term (current) use of opiate analgesic: Secondary | ICD-10-CM | POA: Diagnosis not present

## 2016-11-04 DIAGNOSIS — G894 Chronic pain syndrome: Secondary | ICD-10-CM | POA: Diagnosis not present

## 2016-11-04 DIAGNOSIS — M4726 Other spondylosis with radiculopathy, lumbar region: Secondary | ICD-10-CM | POA: Diagnosis not present

## 2016-11-29 ENCOUNTER — Other Ambulatory Visit: Payer: Self-pay | Admitting: Obstetrics and Gynecology

## 2016-11-29 DIAGNOSIS — Z1231 Encounter for screening mammogram for malignant neoplasm of breast: Secondary | ICD-10-CM

## 2016-12-12 ENCOUNTER — Encounter: Payer: Self-pay | Admitting: Internal Medicine

## 2016-12-12 ENCOUNTER — Ambulatory Visit (INDEPENDENT_AMBULATORY_CARE_PROVIDER_SITE_OTHER): Payer: Medicare HMO | Admitting: Internal Medicine

## 2016-12-12 VITALS — BP 102/76 | HR 80 | Ht 68.0 in | Wt 211.5 lb

## 2016-12-12 DIAGNOSIS — R197 Diarrhea, unspecified: Secondary | ICD-10-CM

## 2016-12-12 DIAGNOSIS — K589 Irritable bowel syndrome without diarrhea: Secondary | ICD-10-CM

## 2016-12-12 MED ORDER — CILIDINIUM-CHLORDIAZEPOXIDE 2.5-5 MG PO CAPS: 1.0000 | ORAL_CAPSULE | Freq: Three times a day (TID) | ORAL | 3 refills | Status: DC

## 2016-12-12 NOTE — Patient Instructions (Signed)
We have sent the following medications to your pharmacy for you to pick up at your convenience:  Librax  Begin taking 2 tablespoons of Metamucil daily  Discontinue the Questran

## 2016-12-12 NOTE — Progress Notes (Signed)
HISTORY OF PRESENT ILLNESS:  Andrea [name] is a 41 y.o. female with hypertension, anxiety/depression, hypothyroidism, hyperlipidemia, morbid obesity, GERD, IBS, and status post cholecystectomy. Last evaluated in this office May 2016 regarding right upper quadrant discomfort felt to be musculoskeletal, GERD, fatty liver, rectal bleeding, and multiple non-GI somatic complaints. See that dictation. Abdominal ultrasound was negative except for fatty liver. Subsequent complete colonoscopy and upper endoscopy were normal. She was prescribed Questran for worsening diarrhea postcholecystectomy. 6 months of refills provided. Patient presents today with a myriad of GI and non-GI complaints. The principal GI complaint is explosive diarrhea that affects her lifestyle. This may be exacerbated by meals. This happens most days. However, she describes constipation approximately 1 time per week with hard stools. Associated complaints include abdominal cramping, bloating, belching, and intermittent minor rectal bleeding. She was diagnosed as prediabetic and has lost some weight intentionally. I asked her about Questran therapy. She tells me that she has been taking this twice daily since it was prescribed 2 years ago. However, I cannot tell based on the electronic medical record that she has actually been refilling this. She states we have been refilling this for her. Again, not confirmed by the electronic medical record. In any event, she does use Imodium occasionally which helps. Other GI complaints include reflux, occasional dysphagia to solids and liquids, chest pain, nausea, vomiting, fecal incontinence, black stools, and fatty liver.  REVIEW OF SYSTEMS:  All non-GI ROS negative except for sinus and allergy, anxiety, arthritis, back pain, fatigue, depression, sleeping problems, sore throat, excessive thirst, excessive urination, urinary frequency, urinary leakage, raspy voice, shortness of breath  Past Medical  History:  Diagnosis Date  . Anxiety   . Arthritis    ruptured lumbar disc-careful with positioning  . Blood dyscrasia    protein s deficiency-no treatment since 2006  . Depression   . GERD (gastroesophageal reflux disease)   . Headache(784.0)   . Hemorrhoids   . High cholesterol   . Hyperlipemia   . Hypertension   . Hypothyroidism   . IBS (irritable bowel syndrome)   . MVP (mitral valve prolapse)    echo per dr Dagmar Hait  . Protein S deficiency ([place]) 2003   dvt/pulmonary embolus-on coumadin for 6 months then off-Sees [facility]  . Pulmonary embolism ([place])   . PVC (premature ventricular contraction)     Past Surgical History:  Procedure Laterality Date  . CESAREAN SECTION  2006  . CHOLECYSTECTOMY N/A 11/03/2014   Procedure: LAPAROSCOPIC CHOLECYSTECTOMY WITH INTRAOPERATIVE CHOLANGIOGRAM;  Surgeon: Georganna Skeans, MD;  Location: [place];  Service: General;  Laterality: N/A;  . LAPAROSCOPIC ASSISTED VAGINAL HYSTERECTOMY  03/27/2011   Procedure: LAPAROSCOPIC ASSISTED VAGINAL HYSTERECTOMY;  Surgeon: Cyril Mourning, MD;  Location: [facility];  Service: Gynecology;  Laterality: N/A;  . SALPINGOOPHORECTOMY  03/27/2011   Procedure: SALPINGO OOPHERECTOMY;  Surgeon: Cyril Mourning, MD;  Location: [facility];  Service: Gynecology;  Laterality: Bilateral;  . TONSILLECTOMY  92  . vaginal reconstructive surgery  2001    Social History STARLETTE THUROW  reports that she quit smoking about 12 years ago. Her smoking use included Cigarettes. She has a 15.00 pack-year smoking history. She has never used smokeless tobacco. She reports that she drinks alcohol. She reports that she does not use drugs.  family history includes Asthma in her son and son; Clotting disorder in her maternal uncle and paternal grandmother; Heart disease in her maternal grandfather; High blood pressure in her father and mother; Hyperlipidemia in  her father; Hypertension in her other.  No Known Allergies     PHYSICAL  EXAMINATION: Vital signs: BP 102/76 (BP Location: Left Arm, Patient Position: Sitting, Cuff Size: Normal)   Pulse 80   Ht 5' 8"  (1.727 m)   Wt 211 lb 8 oz (95.9 kg)   LMP 03/19/2011   BMI 32.16 kg/m   Constitutional: generally well-appearing, no acute distress Psychiatric: alert and oriented x3, cooperative. Pressured rambling speech Eyes: extraocular movements intact, anicteric, conjunctiva pink Mouth: oral pharynx moist, no lesions. No thrush Neck: supple without thyromegaly Lymph: no lymphadenopathy Cardiovascular: heart regular rate and rhythm, no murmur Lungs: clear to auscultation bilaterally Abdomen: soft, obese, nontender, nondistended, no obvious ascites, no peritoneal signs, normal bowel sounds, no organomegaly Rectal: Omitted, Extremities: no clubbing cyanosis or lower extremity edema bilaterally Skin: no lesions on visible extremities Neuro: No focal deficits. Cranial nerves intact. Normal DTRs. No asterixis.    ASSESSMENT:  #1. Irritable bowel syndrome with predominance of diarrhea alternating with constipation. Normal colonoscopy August 2016. Ongoing. Medication compliance questioned #2. Functional GI complaints #3. GERD. Normal EGD August 2016 #4. Obesity #5. Fatty liver #6. Multiple non-GI somatic complaints #7. Status post cholecystectomy  PLAN:  #1. Stop Questran since she states this did not help despite taking it twice daily every day since previously prescribed (per patient) #2. Initiate Metamucil 2 tablespoons daily #3. Librax one before meals as needed not to exceed 3 times daily #4. Reflux precautions #5. Exercise and weight loss #6. GI follow-up one year #7. resume general medical care with PCP   25 minutes spent face-to-face with the patient. Greater than 50% a time use for counseling regarding her diarrhea predominant IBS and a dressing several other GI complaints

## 2016-12-13 ENCOUNTER — Telehealth: Payer: Self-pay | Admitting: Internal Medicine

## 2016-12-16 MED ORDER — DICYCLOMINE HCL 20 MG PO TABS: ORAL_TABLET | ORAL | 2 refills | Status: DC

## 2016-12-16 NOTE — Telephone Encounter (Signed)
See patient's question; please let me know of any alternatives I can give her.

## 2016-12-16 NOTE — Telephone Encounter (Signed)
Try Bentyl 20 mg 15-20 minutes before meals as needed. Dispensed #90.; 2 refills

## 2016-12-16 NOTE — Telephone Encounter (Signed)
Spoke with patient and told her I sent an rx for Bentyl to her pharmacy to see if this is more affordable.  Patient acknowledged and understood.

## 2017-01-02 ENCOUNTER — Other Ambulatory Visit: Payer: Self-pay | Admitting: Internal Medicine

## 2017-01-02 DIAGNOSIS — R7303 Prediabetes: Secondary | ICD-10-CM

## 2017-01-02 DIAGNOSIS — R69 Illness, unspecified: Secondary | ICD-10-CM | POA: Diagnosis not present

## 2017-01-10 ENCOUNTER — Ambulatory Visit: Payer: Medicare HMO | Admitting: Internal Medicine

## 2017-01-13 DIAGNOSIS — Z79891 Long term (current) use of opiate analgesic: Secondary | ICD-10-CM | POA: Diagnosis not present

## 2017-01-13 DIAGNOSIS — M4726 Other spondylosis with radiculopathy, lumbar region: Secondary | ICD-10-CM | POA: Diagnosis not present

## 2017-01-13 DIAGNOSIS — G894 Chronic pain syndrome: Secondary | ICD-10-CM | POA: Diagnosis not present

## 2017-01-13 DIAGNOSIS — M5416 Radiculopathy, lumbar region: Secondary | ICD-10-CM | POA: Diagnosis not present

## 2017-01-31 ENCOUNTER — Ambulatory Visit: Payer: Self-pay | Admitting: Internal Medicine

## 2017-02-03 ENCOUNTER — Other Ambulatory Visit: Payer: Self-pay

## 2017-02-03 DIAGNOSIS — E785 Hyperlipidemia, unspecified: Secondary | ICD-10-CM

## 2017-02-03 DIAGNOSIS — Z79899 Other long term (current) drug therapy: Secondary | ICD-10-CM

## 2017-02-03 DIAGNOSIS — E039 Hypothyroidism, unspecified: Secondary | ICD-10-CM

## 2017-02-11 ENCOUNTER — Ambulatory Visit
Admission: RE | Admit: 2017-02-11 | Discharge: 2017-02-11 | Disposition: A | Discharge status: Home / Self Care | Payer: Medicare HMO | Source: Ambulatory Visit | Attending: Obstetrics and Gynecology | Admitting: Obstetrics and Gynecology

## 2017-02-11 DIAGNOSIS — Z1231 Encounter for screening mammogram for malignant neoplasm of breast: Secondary | ICD-10-CM

## 2017-02-19 ENCOUNTER — Other Ambulatory Visit: Payer: Medicare HMO

## 2017-02-21 ENCOUNTER — Encounter: Payer: Self-pay | Admitting: Internal Medicine

## 2017-02-21 ENCOUNTER — Ambulatory Visit (INDEPENDENT_AMBULATORY_CARE_PROVIDER_SITE_OTHER): Payer: Medicare HMO | Admitting: Internal Medicine

## 2017-02-21 VITALS — BP 122/84 | HR 68 | Temp 98.2°F | Resp 18 | Ht 68.0 in | Wt 207.0 lb

## 2017-02-21 DIAGNOSIS — E039 Hypothyroidism, unspecified: Secondary | ICD-10-CM | POA: Diagnosis not present

## 2017-02-21 DIAGNOSIS — I10 Essential (primary) hypertension: Secondary | ICD-10-CM

## 2017-02-21 DIAGNOSIS — Z23 Encounter for immunization: Secondary | ICD-10-CM | POA: Diagnosis not present

## 2017-02-21 DIAGNOSIS — E034 Atrophy of thyroid (acquired): Secondary | ICD-10-CM

## 2017-02-21 DIAGNOSIS — E785 Hyperlipidemia, unspecified: Secondary | ICD-10-CM | POA: Diagnosis not present

## 2017-02-21 DIAGNOSIS — R739 Hyperglycemia, unspecified: Secondary | ICD-10-CM

## 2017-02-21 DIAGNOSIS — Z79899 Other long term (current) drug therapy: Secondary | ICD-10-CM | POA: Insufficient documentation

## 2017-02-21 LAB — LIPID PANEL
Cholesterol: 245 mg/dL — ABNORMAL HIGH (ref <200)
HDL: 42 mg/dL — ABNORMAL LOW (ref >50)
LDL Cholesterol (Calc): 152 mg/dL (calc) — ABNORMAL HIGH
Non-HDL Cholesterol (Calc): 203 mg/dL (calc) — ABNORMAL HIGH (ref <130)
Total CHOL/HDL Ratio: 5.8 (calc) — ABNORMAL HIGH (ref <5.0)
Triglycerides: 329 mg/dL — ABNORMAL HIGH (ref <150)

## 2017-02-21 LAB — COMPLETE METABOLIC PANEL WITH GFR
AG Ratio: 1.6 (calc) (ref 1.0–2.5)
ALT: 18 U/L (ref 6–29)
AST: 16 U/L (ref 10–30)
Albumin: 3.9 g/dL (ref 3.6–5.1)
Alkaline phosphatase (APISO): 76 U/L (ref 33–115)
BUN: 10 mg/dL (ref 7–25)
CO2: 30 mmol/L (ref 20–32)
Calcium: 9.5 mg/dL (ref 8.6–10.2)
Chloride: 102 mmol/L (ref 98–110)
Creat: 0.92 mg/dL (ref 0.50–1.10)
GFR, Est African American: 89 mL/min/{1.73_m2} (ref >=60)
GFR, Est Non African American: 77 mL/min/{1.73_m2} (ref >=60)
Globulin: 2.5 g/dL (calc) (ref 1.9–3.7)
Glucose, Bld: 98 mg/dL (ref 65–99)
Potassium: 5 mmol/L (ref 3.5–5.3)
Sodium: 139 mmol/L (ref 135–146)
Total Bilirubin: 0.4 mg/dL (ref 0.2–1.2)
Total Protein: 6.4 g/dL (ref 6.1–8.1)

## 2017-02-21 LAB — TSH: TSH: 0.03 mIU/L — ABNORMAL LOW

## 2017-02-21 LAB — EXTRA LAV TOP TUBE

## 2017-02-21 LAB — T4, FREE: Free T4: 1.7 ng/dL (ref 0.8–1.8)

## 2017-02-21 NOTE — Progress Notes (Signed)
Patient ID: Andrea [name], female   DOB: 1975-12-05, 41 y.o.MRN: [id number]    Location:  [facility] Place of Service: OFFICE  Chief Complaint  Patient presents with  . Medical Management of Chronic Issues    Pt is being seen for a routine visit.     HPI:  41 yo female seen today for f/u. She is c/a intermittent stuttering the last 2 mos. She feels like her tongue "gets stuck". She reports increased stressors at home and had to get restraining orders on her son's father. She is c/a her father is he was hospitalized last month for extensive LLE arterial clot. Hypercoagulable w/u pending.   Obesity - she has lost 6 lbs since last OV (total 20 lbs in since Oct 2017). LFTs nml.  Hypothyroidsm - slightly overactive. She takes brand synthroid 114mg daily. TSH 0.02  BP stable at home on lasix, losartan and lopressor. She takes ASA daily  Hyperlipidemia - stale on zetia/crestor. LDL 112; TG 161; Tchol 187  Vit D defic - improved. Vit D 36 on supplement  Allergic rhinitis - stable on prn singulair  Prediabetes - A1c 5.4% in Jan 2018  Diarrhea/GERD/IBS - stable. takes qSweden Acid reflux stable on nexium. Takes prn zofran  She sees psych Dr KToy Carefor OCD/social anxiety/MDD. Takes fluoxetine, lamictal, wellbutrin sr. Clonazepam changed to alprazolam XR BID.  She has an autistic son. Sertraline stopped due to ADRs.  Followed by Dr KHarlene Ramus Mood is stable  She discovered that her ex husband is IVDA and shared needles with Hep C and HIV (+) individual. Last sexual contact in 2010. [facility]. Neg Hepatitis panel. RUQ UKoreashowed fatty liver  She is a poor historian due to psych dx. Hx obtained from chart  Past Medical History:  Diagnosis Date  . Anxiety   . Arthritis    ruptured lumbar disc-careful with positioning  . Blood dyscrasia    protein s deficiency-no treatment since 2006  . Depression   . GERD (gastroesophageal reflux disease)   .  Headache(784.0)   . Hemorrhoids   . High cholesterol   . Hyperlipemia   . Hypertension   . Hypothyroidism   . IBS (irritable bowel syndrome)   . MVP (mitral valve prolapse)    echo per dr aDagmar Hait . Protein S [place] [place]   dvt/pulmonary embolus-on coumadin for 6 months then off-Sees [facility]  . Pulmonary embolism ([facility]   . PVC (premature ventricular contraction)     Past Surgical History:  Procedure Laterality Date  . CESAREAN SECTION  2006  . CHOLECYSTECTOMY N/A 11/03/2014   Procedure: LAPAROSCOPIC CHOLECYSTECTOMY WITH INTRAOPERATIVE CHOLANGIOGRAM;  Surgeon: BGeorganna Skeans MD;  [place]  Service: General;  Laterality: N/A;  . LAPAROSCOPIC ASSISTED VAGINAL HYSTERECTOMY  03/27/2011   Procedure: LAPAROSCOPIC ASSISTED VAGINAL HYSTERECTOMY;  Surgeon: MCyril Mourning MD;  [facility];  Service: Gynecology;  Laterality: N/A;  . SALPINGOOPHORECTOMY  03/27/2011   Procedure: SALPINGO OOPHERECTOMY;  Surgeon: MCyril Mourning MD;  [facility];  Service: Gynecology;  Laterality: Bilateral;  . TONSILLECTOMY  92  . vaginal reconstructive surgery  2001    Patient Care Team: CGildardo Cranker DO as PCP - General (Internal Medicine) WElsie Stain MD as Attending Physician (Pulmonary Disease) PIrene Shipper MD as Consulting Physician (Gastroenterology) KChucky May MD as Consulting Physician (Psychiatry) GAdrian Prows MD as Consulting Physician (Cardiology) GDian Queen MD as Consulting Physician (Obstetrics and Gynecology)  Nicholaus Bloom, MD (Anesthesiology)  Social History   Social History  . Marital status: Divorced    Spouse name: N/A  . Number of children: 2  . Years of education: N/A   Occupational History  . 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 Smoking status: Former Smoker    Packs/day: 1.00    Years: 15.00    Types: Cigarettes    Quit date: 03/18/2004  . Smokeless tobacco: Never Used  . Alcohol  use Yes     Comment: rarely  . Drug use: No  . Sexual activity: Yes   Other Topics Concern  . Not on file   Social History Narrative   Diet: Regular   Caffeine: Yes   Married- divorced, married in 2006   House: Yes, 2 persons   Pets: 1 dog   Current/Past profession: [employer] 2002-2013   Exercise: Yes, walking   Living Will: No    DNR: No    POA/HPOA: No            reports that she quit smoking about 12 years ago. Her smoking use included Cigarettes. She has a 15.00 pack-year smoking history. She has never used smokeless tobacco. She reports that she drinks alcohol. She reports that she does not use drugs.  Family History  Problem Relation Age of Onset  . High blood pressure Mother   . Hyperlipidemia Father        fathers side of family  . High blood pressure Father   . Hypertension Other        entire family on both sides  . Asthma Son   . Asthma Son   . Clotting disorder Maternal Uncle   . Clotting disorder Paternal Grandmother   . Heart disease Maternal Grandfather    Family Status  Relation Status  . Mother Alive  . Father Alive  . Sister Alive  . Son Alive  . Son Alive  . Other (Not Specified)  . Son (Not Specified)  . Son (Not Specified)  . Mat Uncle (Not Specified)  . PGM (Not Specified)  . MGF (Not Specified)     No Known Allergies  Medications: Patient's Medications  New Prescriptions   No medications on file  Previous Medications   ALPRAZOLAM (XANAX XR) 2 MG 24 HR TABLET    Take 2 mg by mouth 2 (two) times daily.   ALPRAZOLAM (XANAX) 1 MG TABLET    Take 2 mg by mouth at bedtime as needed.    ASPIRIN EC 81 MG TABLET    Take 81 mg by mouth daily.   BLOOD GLUCOSE MONITORING SUPPL (ONE TOUCH ULTRA SYSTEM KIT) W/DEVICE KIT    1 kit by Does not apply route once. Check blood sugar three times a week 2 hours after meals, call is BS >200 DX R73.03   BUPROPION (WELLBUTRIN XL) 300 MG 24 HR TABLET    Take 1 tablet by mouth daily.   CALCIUM CARBONATE  (OS-CAL) 600 MG TABS    Take 600 mg by mouth daily.   COENZYME Q10 (COQ10) 100 MG CAPS    Take 100 mg by mouth daily.    DICYCLOMINE (BENTYL) 20 MG TABLET    Take one tab by mouth 15 to 20 minutes before eating as needed   ESOMEPRAZOLE (NEXIUM) 40 MG CAPSULE    Take 1 capsule (40 mg total) by mouth daily.   ESTRADIOL (ESTRACE) 0.1 MG/GM VAGINAL CREAM    Place 1  Applicatorful vaginally 2 (two) times a week.   EZETIMIBE (ZETIA) 10 MG TABLET    TAKE 1 TABLET BY MOUTH EVERY DAY   FLUOXETINE (PROZAC) 40 MG CAPSULE    Take 80 mg by mouth daily.   FUROSEMIDE (LASIX) 20 MG TABLET    Take 20 mg by mouth daily as needed for fluid.    LAMOTRIGINE (LAMICTAL) 150 MG TABLET    Take 150 mg by mouth daily.   LOPERAMIDE (IMODIUM) 2 MG CAPSULE    Take by mouth as needed for diarrhea or loose stools.   LOSARTAN (COZAAR) 100 MG TABLET    TAKE 1 TABLET BY MOUTH EVERY DAY   METOPROLOL TARTRATE (LOPRESSOR) 25 MG TABLET    TAKE 1 TABLET BY MOUTH TWICE DAILY   MIRTAZAPINE (REMERON) 15 MG TABLET    Take 15 mg by mouth at bedtime.   MONTELUKAST (SINGULAIR) 10 MG TABLET    Take 10 mg by mouth at bedtime.   MULTIPLE VITAMINS-MINERALS (MULTIVITAMIN WITH MINERALS) TABLET    Take 1 tablet by mouth daily.    ONDANSETRON (ZOFRAN) 4 MG TABLET    TAKE 1 TABLET BY MOUTH EVERY 8 HOURS PRV FOR NAUSEA AND VOMITING   ONE TOUCH ULTRA TEST TEST STRIP    CHECK BLOOD SUGAR THREE TIMES WEEKLY 2 HOURS AFTER MEALS, CALL DOCTOR IF BLOOD SUGAR GREATER THAN 332   ONETOUCH DELICA LANCETS 95J MISC    CHECK BLOOD SUGAR THREE TIMES A WEEK 2 HOURS AFTER MEALS. CALL DOCTOR IF BLOOD SUGAR GREATER THAN 200   OXYCODONE-ACETAMINOPHEN (PERCOCET) 7.5-325 MG TABLET    Take 1 tablet by mouth every 6 (six) hours as needed for severe pain.   ROSUVASTATIN (CRESTOR) 10 MG TABLET    TAKE 1 TABLET(10 MG) BY MOUTH DAILY   SYNTHROID 125 MCG TABLET    Take 1 tablet (125 mcg total) by mouth daily before breakfast.   VALACYCLOVIR (VALTREX) 1000 MG TABLET    Take 2 tabs  po BID x 1 day for outbreaks as needed   VITAMIN C (ASCORBIC ACID) 500 MG TABLET    Take 500 mg by mouth daily.   VITAMIN D, ERGOCALCIFEROL, (DRISDOL) 50000 UNITS CAPS CAPSULE    TAKE 1 CAPSULE BY MOUTH EVERY 7 DAYS   VITAMIN E 400 UNIT CAPSULE    Take 400 Units by mouth daily.  Modified Medications   No medications on file  Discontinued Medications   BUPROPION (WELLBUTRIN SR) 150 MG 12 HR TABLET    Take 150 mg by mouth daily.   BUPROPION (WELLBUTRIN XL) 300 MG 24 HR TABLET    Take 1 tablet by mouth daily.   CLIDINIUM-CHLORDIAZEPOXIDE (LIBRAX) 5-2.5 MG CAPSULE    Take 1 capsule by mouth 3 (three) times daily before meals.   PHENAZOPYRIDINE (PYRIDIUM) 200 MG TABLET    Take 1 tablet (200 mg total) by mouth 3 (three) times daily as needed for pain.    Review of Systems  Unable to perform ROS: Psychiatric disorder    Vitals:   02/21/17 0917  BP: 122/84  Pulse: 68  Resp: 18  Temp: 98.2 F (36.8 C)  TempSrc: Oral  SpO2: 97%  Weight: 207 lb (93.9 kg)  Height: _0  (1.727 m)   Body mass index is 31.47 kg/m.  Physical Exam  Constitutional: She appears well-developed and well-nourished.  HENT:  Mouth/Throat: Oropharynx is clear and moist. No oropharyngeal exudate.  MMM; no oral thrush  Eyes: Pupils are equal, round, and reactive to light.  No scleral icterus.  Neck: Neck supple. Carotid bruit is not present. No tracheal deviation present. No thyromegaly present.  Cardiovascular: Normal rate, regular rhythm, normal heart sounds and intact distal pulses.  Exam reveals no gallop and no friction rub.   No murmur heard. No LE edema b/l. no calf TTP.   Pulmonary/Chest: Effort normal and breath sounds normal. No stridor. No respiratory distress. She has no wheezes. She has no rales.  Abdominal: Soft. Normal appearance and bowel sounds are normal. She exhibits no distension and no mass. There is no hepatomegaly. There is no tenderness. There is no rigidity, no rebound and no guarding. No  hernia.  Lymphadenopathy:    She has no cervical adenopathy.  Neurological: She is alert.  Skin: Skin is warm and dry. No rash noted.  Psychiatric: Her behavior is normal. Judgment and thought content normal. Her mood appears anxious.     Labs reviewed: No visits with results within 3 Month(s) from this visit.  Latest known visit with results is:  Appointment on 09/30/2016  Component Date Value Ref Range Status  . Sodium 09/30/2016 139  135 - 146 mmol/L Final  . Potassium 09/30/2016 4.4  3.5 - 5.3 mmol/L Final  . Chloride 09/30/2016 103  98 - 110 mmol/L Final  . CO2 09/30/2016 28  20 - 31 mmol/L Final  . Glucose, Bld 09/30/2016 96  65 - 99 mg/dL Final  . BUN 58/83/8598 11  7 - 25 mg/dL Final  . Creat 87/55/6788 1.01  0.50 - 1.10 mg/dL Final  . Total Bilirubin 09/30/2016 0.5  0.2 - 1.2 mg/dL Final  . Alkaline Phosphatase 09/30/2016 60  33 - 115 U/L Final  . AST 09/30/2016 17  10 - 30 U/L Final  . ALT 09/30/2016 19  6 - 29 U/L Final  . Total Protein 09/30/2016 6.3  6.1 - 8.1 g/dL Final  . Albumin 08/79/5271 4.1  3.6 - 5.1 g/dL Final  . Calcium 51/22/0947 9.2  8.6 - 10.2 mg/dL Final  . GFR, Est African American 09/30/2016 79  >=60 mL/min Final  . GFR, Est Non African American 09/30/2016 69  >=60 mL/min Final  . Cholesterol 09/30/2016 187  <200 mg/dL Final  . Triglycerides 09/30/2016 161* <150 mg/dL Final  . HDL 65/75/3069 43* >50 mg/dL Final  . Total CHOL/HDL Ratio 09/30/2016 4.3  <4.4 Ratio Final  . VLDL 09/30/2016 32* <30 mg/dL Final  . LDL Cholesterol 09/30/2016 112* <100 mg/dL Final  . TSH 66/73/0455 0.02* mIU/L Final   Comment:   Reference Range   > or = 20 Years  0.40-4.50   Pregnancy Range First trimester  0.26-2.66 Second trimester 0.55-2.73 Third trimester  0.43-2.91     . Vit D, 25-Hydroxy 09/30/2016 36  30 - 100 ng/mL Final   Comment: Vitamin D Status           25-OH Vitamin D        Deficiency                <20 ng/mL        Insufficiency         20 - 29  ng/mL        Optimal             > or = 30 ng/mL   For 25-OH Vitamin D testing on patients on D2-supplementation and patients for whom quantitation of D2 and D3 fractions is required, the QuestAssureD 25-OH VIT D, (D2,D3), LC/MS/MS is recommended: order code  85814 (patients > 2 yrs).     Mm Screening Breast Tomo Bilateral  Result Date: 02/11/2017 CLINICAL DATA:  Screening. EXAM: 2D DIGITAL SCREENING BILATERAL MAMMOGRAM WITH CAD AND ADJUNCT TOMO COMPARISON:  Previous exam(s). ACR Breast Density Category b: There are scattered areas of fibroglandular density. FINDINGS: There are no findings suspicious for malignancy. Images were processed with CAD. IMPRESSION: No mammographic evidence of malignancy. A result letter of this screening mammogram will be mailed directly to the patient. RECOMMENDATION: Screening mammogram in one year. (Code:SM-B-01Y) BI-RADS CATEGORY  1: Negative. Electronically Signed   By: Margarette Canada M.D.   On: 02/11/2017 17:01     Assessment/Plan   ICD-10-CM   1. Hypothyroidism due to acquired atrophy of thyroid E03.4   2. Hyperlipidemia LDL goal <100 E78.5   3. Essential hypertension I10   4. High risk medication use F79.024 COMPLETE METABOLIC PANEL WITH GFR    TSH    Lipid panel    T4, free  5. Hypothyroidism, unspecified type O97.3 COMPLETE METABOLIC PANEL WITH GFR    TSH    Lipid panel    T4, free  6. Hyperlipidemia, unspecified hyperlipidemia type Z32.9 COMPLETE METABOLIC PANEL WITH GFR    TSH    Lipid panel    T4, free  7. Need for immunization against influenza Z23 Flu Vaccine QUAD 6+ mos PF IM (Fluarix Quad PF)   Influenza vaccine given today  Will call with lab results  Follow up with specialists as scheduled  Follow up in 3 mos for HTN, hyperlipidemia, thyroid. Fasting labs prior to next [employer]. Perlie Gold  [facility] [address] [place], [place] [address] [phone] Cell  (Monday-Friday 8 AM - 5 PM) [phone] After 5 PM and follow prompts

## 2017-02-21 NOTE — Patient Instructions (Addendum)
Influenza vaccine given today  Will call with lab results  Follow up with specialists as scheduled  Follow up in 3 mos for HTN, hyperlipidemia, thyroid. Fasting labs prior to next ov

## 2017-02-24 ENCOUNTER — Telehealth: Payer: Self-pay

## 2017-02-24 MED ORDER — ROSUVASTATIN CALCIUM 20 MG PO TABS: 20.0000 mg | ORAL_TABLET | Freq: Every day | ORAL | 6 refills | Status: DC

## 2017-02-24 NOTE — Telephone Encounter (Signed)
Medication list has been updated to reflect recent lab results. Rx was sent to pharmacy electronically.

## 2017-02-24 NOTE — Telephone Encounter (Signed)
-----   Message from [place], [place] sent at 02/22/2017  4:19 PM EDT ----- Cholesterol is worse - increase crestor to 20mg  #30 take 1 tab po daily with 6 RF; cont other medications as ordered; thyroid borderline but stable; follow up as scheduled

## 2017-03-03 ENCOUNTER — Other Ambulatory Visit: Payer: Self-pay | Admitting: Internal Medicine

## 2017-03-03 DIAGNOSIS — E785 Hyperlipidemia, unspecified: Secondary | ICD-10-CM

## 2017-03-11 DIAGNOSIS — R69 Illness, unspecified: Secondary | ICD-10-CM | POA: Diagnosis not present

## 2017-03-13 DIAGNOSIS — G894 Chronic pain syndrome: Secondary | ICD-10-CM | POA: Diagnosis not present

## 2017-03-13 DIAGNOSIS — M4726 Other spondylosis with radiculopathy, lumbar region: Secondary | ICD-10-CM | POA: Diagnosis not present

## 2017-03-13 DIAGNOSIS — M5416 Radiculopathy, lumbar region: Secondary | ICD-10-CM | POA: Diagnosis not present

## 2017-03-13 DIAGNOSIS — Z79891 Long term (current) use of opiate analgesic: Secondary | ICD-10-CM | POA: Diagnosis not present

## 2017-04-13 ENCOUNTER — Other Ambulatory Visit: Payer: Self-pay | Admitting: Internal Medicine

## 2017-04-14 ENCOUNTER — Other Ambulatory Visit: Payer: Self-pay | Admitting: Internal Medicine

## 2017-04-14 MED ORDER — ONDANSETRON HCL 4 MG PO TABS: 4.0000 mg | ORAL_TABLET | Freq: Three times a day (TID) | ORAL | 0 refills | Status: DC | PRN

## 2017-04-14 NOTE — Telephone Encounter (Signed)
Spoke with patient and advised results rx sent to pharmacy by e-script  

## 2017-04-14 NOTE — Telephone Encounter (Signed)
Ok x 1

## 2017-04-14 NOTE — Telephone Encounter (Signed)
Refill denied, spoke with patient and advised that last rx for Zofran was over a year ago. Per pt she has been getting this from her GI dr and will discuss with Dr. Eulas Post at her appt in Jan 2019

## 2017-04-14 NOTE — Addendum Note (Signed)
Addended by: Despina Hidden on: 04/14/2017 03:28 PM   Modules accepted: Orders

## 2017-04-14 NOTE — Telephone Encounter (Signed)
Dr. Eulas Post, pt called back and is asking for a refill, please advise, last filled 10/27/15.

## 2017-04-28 ENCOUNTER — Other Ambulatory Visit: Payer: Self-pay | Admitting: Internal Medicine

## 2017-04-28 NOTE — Telephone Encounter (Signed)
Rx Escribed to pharmacy.

## 2017-05-08 DIAGNOSIS — G894 Chronic pain syndrome: Secondary | ICD-10-CM | POA: Diagnosis not present

## 2017-05-08 DIAGNOSIS — M4726 Other spondylosis with radiculopathy, lumbar region: Secondary | ICD-10-CM | POA: Diagnosis not present

## 2017-05-08 DIAGNOSIS — Z79891 Long term (current) use of opiate analgesic: Secondary | ICD-10-CM | POA: Diagnosis not present

## 2017-05-08 DIAGNOSIS — M5416 Radiculopathy, lumbar region: Secondary | ICD-10-CM | POA: Diagnosis not present

## 2017-05-12 ENCOUNTER — Other Ambulatory Visit: Payer: Self-pay | Admitting: Internal Medicine

## 2017-05-12 DIAGNOSIS — E034 Atrophy of thyroid (acquired): Secondary | ICD-10-CM

## 2017-05-23 ENCOUNTER — Other Ambulatory Visit: Payer: Medicare HMO

## 2017-05-27 ENCOUNTER — Ambulatory Visit: Payer: Medicare HMO | Admitting: Internal Medicine

## 2017-05-30 ENCOUNTER — Other Ambulatory Visit: Payer: Self-pay | Admitting: Internal Medicine

## 2017-06-02 ENCOUNTER — Other Ambulatory Visit: Payer: Self-pay | Admitting: Internal Medicine

## 2017-06-02 DIAGNOSIS — E034 Atrophy of thyroid (acquired): Secondary | ICD-10-CM

## 2017-06-11 DIAGNOSIS — R69 Illness, unspecified: Secondary | ICD-10-CM | POA: Diagnosis not present

## 2017-06-12 ENCOUNTER — Other Ambulatory Visit: Payer: Self-pay | Admitting: Internal Medicine

## 2017-06-13 ENCOUNTER — Other Ambulatory Visit: Payer: Medicare HMO

## 2017-06-13 DIAGNOSIS — R739 Hyperglycemia, unspecified: Secondary | ICD-10-CM

## 2017-06-13 DIAGNOSIS — Z79899 Other long term (current) drug therapy: Secondary | ICD-10-CM

## 2017-06-13 DIAGNOSIS — E785 Hyperlipidemia, unspecified: Secondary | ICD-10-CM | POA: Diagnosis not present

## 2017-06-13 DIAGNOSIS — E034 Atrophy of thyroid (acquired): Secondary | ICD-10-CM

## 2017-06-14 LAB — LIPID PANEL
Cholesterol: 264 mg/dL — ABNORMAL HIGH (ref <200)
HDL: 46 mg/dL — ABNORMAL LOW (ref >50)
LDL Cholesterol (Calc): 170 mg/dL (calc) — ABNORMAL HIGH
Non-HDL Cholesterol (Calc): 218 mg/dL (calc) — ABNORMAL HIGH (ref <130)
Total CHOL/HDL Ratio: 5.7 (calc) — ABNORMAL HIGH (ref <5.0)
Triglycerides: 309 mg/dL — ABNORMAL HIGH (ref <150)

## 2017-06-14 LAB — COMPLETE METABOLIC PANEL WITH GFR
AG Ratio: 1.6 (calc) (ref 1.0–2.5)
ALT: 62 U/L — ABNORMAL HIGH (ref 6–29)
AST: 28 U/L (ref 10–30)
Albumin: 4 g/dL (ref 3.6–5.1)
Alkaline phosphatase (APISO): 82 U/L (ref 33–115)
BUN: 17 mg/dL (ref 7–25)
CO2: 32 mmol/L (ref 20–32)
Calcium: 9.4 mg/dL (ref 8.6–10.2)
Chloride: 100 mmol/L (ref 98–110)
Creat: 0.95 mg/dL (ref 0.50–1.10)
GFR, Est African American: 85 mL/min/{1.73_m2} (ref >=60)
GFR, Est Non African American: 73 mL/min/{1.73_m2} (ref >=60)
Globulin: 2.5 g/dL (calc) (ref 1.9–3.7)
Glucose, Bld: 110 mg/dL — ABNORMAL HIGH (ref 65–99)
Potassium: 4.4 mmol/L (ref 3.5–5.3)
Sodium: 137 mmol/L (ref 135–146)
Total Bilirubin: 0.4 mg/dL (ref 0.2–1.2)
Total Protein: 6.5 g/dL (ref 6.1–8.1)

## 2017-06-14 LAB — TSH: TSH: 0.37 mIU/L — ABNORMAL LOW

## 2017-06-14 LAB — HEMOGLOBIN A1C
Hgb A1c MFr Bld: 5.6 % of total Hgb (ref <5.7)
Mean Plasma Glucose: 114 (calc)
eAG (mmol/L): 6.3 (calc)

## 2017-06-17 ENCOUNTER — Encounter: Payer: Self-pay | Admitting: Internal Medicine

## 2017-06-17 ENCOUNTER — Ambulatory Visit: Payer: Medicare HMO | Admitting: Internal Medicine

## 2017-06-17 VITALS — BP 120/72 | HR 75 | Temp 98.7°F | Ht 68.0 in | Wt 234.0 lb

## 2017-06-17 DIAGNOSIS — E785 Hyperlipidemia, unspecified: Secondary | ICD-10-CM | POA: Diagnosis not present

## 2017-06-17 DIAGNOSIS — I10 Essential (primary) hypertension: Secondary | ICD-10-CM | POA: Diagnosis not present

## 2017-06-17 DIAGNOSIS — E034 Atrophy of thyroid (acquired): Secondary | ICD-10-CM

## 2017-06-17 DIAGNOSIS — E669 Obesity, unspecified: Secondary | ICD-10-CM

## 2017-06-17 DIAGNOSIS — Z6835 Body mass index (BMI) 35.0-35.9, adult: Secondary | ICD-10-CM

## 2017-06-17 DIAGNOSIS — F429 Obsessive-compulsive disorder, unspecified: Secondary | ICD-10-CM

## 2017-06-17 DIAGNOSIS — Z202 Contact with and (suspected) exposure to infections with a predominantly sexual mode of transmission: Secondary | ICD-10-CM

## 2017-06-17 DIAGNOSIS — F4010 Social phobia, unspecified: Secondary | ICD-10-CM

## 2017-06-17 DIAGNOSIS — R69 Illness, unspecified: Secondary | ICD-10-CM | POA: Diagnosis not present

## 2017-06-17 NOTE — Progress Notes (Signed)
cmpPatient ID: Andrea [name], female   DOB: [birth year], 42 y.o.   MRN: [id number]   Location:  [facility] OFFICE  Provider: DR Arletha Grippe  Code Status:  Goals of Care:  Advanced Directives 02/21/2017  Does Patient Have a Medical Advance Directive? No  Would patient like information on creating a medical advance directive? -  Pre-existing out of facility DNR order (yellow form or pink MOST form) -     Chief Complaint  Patient presents with  . Medical Management of Chronic Issues    3 month follow-up, HTN, Hyperlipidemia, Thyroid, + fall risk, and discuss labs (copy given to patient)   . Orders    Discuss additional labs (patient request)   . Weight Gain    Patient concerned about weight gain    HPI: Patient is a 42 y.o. female seen today for medical management of chronic diseases.  She is c/a wt gain. No change in lifestyle.   She is c/a STI exposure - she was using condoms but it broke x 2. Partner informed her that he has herpes and hepatitis. She would like blood STI testing. She has clear vaginal d/c. She has an appt with GYN next week. No new rashes  Obesity - she has gained 27 lbs since last OV. ALT 62. She maintains healthy food choices and exercise daily.  Hypothyroidsm - slightly overactive. She takes brand synthroid 125 mcg daily. TSH 0.37. She has had 27 lb weight gain  BP stable at home on lasix, losartan and lopressor. She takes ASA daily  Hyperlipidemia - stale on zetia/crestor. LDL 112; TG 161; Tchol 187  Vit D defic - improved. Vit D 36 on supplement  Allergic rhinitis - stable on prn singulair  Prediabetes - A1c 5.4% in Jan 2018  Diarrhea/GERD/IBS - stable. takes [place]. Acid reflux stable on nexium. Takes prn zofran  OCD/social anxiety/MDD - mood stable on fluoxetine, lamictal, wellbutrin sr. Clonazepam changed to alprazolam XR BID.  She has an autistic son. Sertraline stopped due to ADRs.  Followed by psych Dr Harlene Ramus.  She discovered that her ex  husband is IVDA and shared needles with Hep C and HIV (+) individual. Last sexual contact in 2010. She had HIV test at [facility] dept that was neg. Neg Hepatitis panel. RUQ US showed fatty liver  She is a poor historian due to psych dx. Hx obtained from chart   Past Medical History:  Diagnosis Date  . Anxiety   . Arthritis    ruptured lumbar disc-careful with positioning  . Blood dyscrasia    protein s deficiency-no treatment since 2006  . Depression   . GERD (gastroesophageal reflux disease)   . Headache(784.0)   . Hemorrhoids   . High cholesterol   . Hyperlipemia   . Hypertension   . Hypothyroidism   . IBS (irritable bowel syndrome)   . MVP (mitral valve prolapse)    echo per dr Dagmar Hait  . Protein S deficiency ([place]) 2003   dvt/pulmonary embolus-on coumadin for 6 months then off-Sees [facility]  . Pulmonary embolism ([place])   . PVC (premature ventricular contraction)     Past Surgical History:  Procedure Laterality Date  . CESAREAN SECTION  2006  . CHOLECYSTECTOMY N/A 11/03/2014   Procedure: LAPAROSCOPIC CHOLECYSTECTOMY WITH INTRAOPERATIVE CHOLANGIOGRAM;  Surgeon: Georganna Skeans, MD;  Location: [facility];  Service: General;  Laterality: N/A;  . LAPAROSCOPIC ASSISTED VAGINAL HYSTERECTOMY  03/27/2011   Procedure: LAPAROSCOPIC ASSISTED VAGINAL HYSTERECTOMY;  Surgeon: Cyril Mourning, MD;  Location: [facility];  Service: Gynecology;  Laterality: N/A;  . SALPINGOOPHORECTOMY  03/27/2011   Procedure: SALPINGO OOPHERECTOMY;  Surgeon: Cyril Mourning, MD;  Location: [facility];  Service: Gynecology;  Laterality: Bilateral;  . TONSILLECTOMY  92  . vaginal reconstructive surgery  2001     reports that she quit smoking about 13 years ago. Her smoking use included cigarettes. She has a 15.00 pack-year smoking history. she has never used smokeless tobacco. She reports that she drinks alcohol. She reports that she does not use drugs. Social History   Socioeconomic History  . Marital status:  Divorced    Spouse name: Not on file  . Number of children: 2  . Years of education: Not on file  . Highest education level: Not on file  Social Needs  . Financial resource strain: Not on file  . Food insecurity - worry: Not on file  . Food insecurity - inability: Not on file  . Transportation needs - medical: Not on file  . Transportation needs - non-medical: Not on file  Occupational History  . Occupation: nutrition    Employer: [employer]  Tobacco Use  . Smoking status: Former Smoker    Packs/day: 1.00    Years: 15.00    Pack years: 15.00    Types: Cigarettes    Last attempt to quit: 03/18/2004    Years since quitting: 13.2  . Smokeless tobacco: Never Used  Substance and Sexual Activity  . Alcohol use: Yes    Comment: rarely  . Drug use: No  . Sexual activity: Yes  Other Topics Concern  . Not on file  Social History Narrative   Diet: Regular   Caffeine: Yes   Married- divorced, married in 2006   House: Yes, 2 persons   Pets: 1 dog   Current/Past profession: [employer] 2002-2013   Exercise: Yes, walking   Living Will: No    DNR: No    POA/HPOA: No        Family History  Problem Relation Age of Onset  . High blood pressure Mother   . Hyperlipidemia Father        fathers side of family  . High blood pressure Father   . Hypertension Other        entire family on both sides  . Asthma Son   . Asthma Son   . Clotting disorder Maternal Uncle   . Clotting disorder Paternal Grandmother   . Heart disease Maternal Grandfather     No Known Allergies  Outpatient Encounter Medications as of 06/17/2017  Medication Sig  . ALPRAZolam (XANAX XR) 2 MG 24 hr tablet Take 2 mg by mouth 2 (two) times daily.  Marland Kitchen ALPRAZolam (XANAX) 1 MG tablet Take 2 mg by mouth at bedtime as needed.   Marland Kitchen aspirin EC 81 MG tablet Take 81 mg by mouth daily.  . Blood Glucose Monitoring Suppl (ONE TOUCH ULTRA SYSTEM KIT) w/Device KIT 1 kit by Does not apply route once. Check blood  sugar three times a week 2 hours after meals, call is BS >200 DX R73.03  . buPROPion (WELLBUTRIN XL) 300 MG 24 hr tablet Take 1 tablet by mouth daily.  . calcium carbonate (OS-CAL) 600 MG TABS Take 600 mg by mouth daily.  . Coenzyme Q10 (COQ10) 100 MG CAPS Take 100 mg by mouth daily.   Marland Kitchen dicyclomine (BENTYL) 20 MG tablet TAKE 1 TABLET BY MOUTH 15 TO 20 MINUTES BEFORE EATING AS NEEDED  . esomeprazole (  NEXIUM) 40 MG capsule Take 1 capsule (40 mg total) by mouth daily.  Marland Kitchen estradiol (ESTRACE) 0.1 MG/GM vaginal cream Place 1 Applicatorful vaginally 2 (two) times a week.  . ezetimibe (ZETIA) 10 MG tablet TAKE 1 TABLET BY MOUTH EVERY DAY  . FLUoxetine (PROZAC) 40 MG capsule Take 80 mg by mouth daily.  . furosemide (LASIX) 20 MG tablet Take 20 mg by mouth daily as needed for fluid.   Marland Kitchen lamoTRIgine (LAMICTAL) 150 MG tablet Take 150 mg by mouth daily.  Marland Kitchen loperamide (IMODIUM) 2 MG capsule Take by mouth as needed for diarrhea or loose stools.  Marland Kitchen losartan (COZAAR) 100 MG tablet TAKE 1 TABLET BY MOUTH EVERY DAY  . metoprolol tartrate (LOPRESSOR) 25 MG tablet TAKE 1 TABLET BY MOUTH TWICE DAILY  . mirtazapine (REMERON) 15 MG tablet Take 15 mg by mouth at bedtime.  . montelukast (SINGULAIR) 10 MG tablet Take 10 mg by mouth at bedtime.  . Multiple Vitamins-Minerals (MULTIVITAMIN WITH MINERALS) tablet Take 1 tablet by mouth daily.   . ondansetron (ZOFRAN) 4 MG tablet TAKE 1 TABLET(4 MG) BY MOUTH EVERY 8 HOURS AS NEEDED FOR NAUSEA OR VOMITING  . ONE TOUCH ULTRA TEST test strip CHECK BLOOD SUGAR THREE TIMES WEEKLY 2 HOURS AFTER MEALS, CALL DOCTOR IF BLOOD SUGAR GREATER THAN 200  . ONETOUCH DELICA LANCETS 50I MISC CHECK BLOOD SUGAR THREE TIMES A WEEK 2 HOURS AFTER MEALS. CALL DOCTOR IF BLOOD SUGAR GREATER THAN 200  . oxyCODONE-acetaminophen (PERCOCET) 7.5-325 MG tablet Take 1 tablet by mouth every 6 (six) hours as needed for severe pain.  . rosuvastatin (CRESTOR) 20 MG tablet Take 1 tablet (20 mg total) by mouth  daily.  Marland Kitchen SYNTHROID 125 MCG tablet TAKE 1 TABLET(125 MCG) BY MOUTH DAILY BEFORE BREAKFAST  . valACYclovir (VALTREX) 1000 MG tablet Take 2 tabs po BID x 1 day for outbreaks as needed  . vitamin C (ASCORBIC ACID) 500 MG tablet Take 500 mg by mouth daily.  . Vitamin D, Ergocalciferol, (DRISDOL) 50000 units CAPS capsule TAKE 1 CAPSULE BY MOUTH EVERY 7 DAYS  . vitamin E 400 UNIT capsule Take 400 Units by mouth daily.   No facility-administered encounter medications on file as of 06/17/2017.     Review of Systems:  Review of Systems  Unable to perform ROS: Psychiatric disorder    Health Maintenance  Topic Date Due  . HIV Screening  03/19/2018 (Originally 03/12/1989)  . PAP SMEAR  02/14/2018  . TETANUS/TDAP  01/08/2024  . INFLUENZA VACCINE  Completed    Physical Exam: Vitals:   06/17/17 0927  BP: 120/72  Pulse: 75  Temp: 98.7 F (37.1 C)  TempSrc: Oral  SpO2: 97%  Weight: 234 lb (106.1 kg)  Height: _0  (1.727 m)   Body mass index is 35.58 kg/m. Physical Exam  Constitutional: She is oriented to person, place, and time. She appears well-developed and well-nourished.  HENT:  Mouth/Throat: Oropharynx is clear and moist. No oropharyngeal exudate.  MMM; no oral thrush  Eyes: Pupils are equal, round, and reactive to light. No scleral icterus.  Neck: Neck supple. Carotid bruit is not present. No tracheal deviation present. No thyromegaly present.  Cardiovascular: Normal rate, regular rhythm and intact distal pulses. Exam reveals no gallop and no friction rub.  Murmur (1/6 SEM) heard. No LE edema b/l. no calf TTP.   Pulmonary/Chest: Effort normal and breath sounds normal. No stridor. No respiratory distress. She has no wheezes. She has no rales.  Abdominal: Soft. Normal appearance  and bowel sounds are normal. She exhibits no distension and no mass. There is no hepatomegaly. There is no tenderness. There is no rigidity, no rebound and no guarding. No hernia.  Musculoskeletal: She  exhibits edema.  Lymphadenopathy:    She has no cervical adenopathy.  Neurological: She is alert and oriented to person, place, and time.  Skin: Skin is warm and dry. No rash noted.  Psychiatric: She has a normal mood and affect. Her behavior is normal. Thought content normal.    Labs reviewed: Basic Metabolic Panel: Recent Labs    09/30/16 0859 02/21/17 1051 06/13/17 0846  NA 139 139 137  K 4.4 5.0 4.4  CL 103 102 100  CO2 28 30 32  GLUCOSE 96 98 110*  BUN _0 CREATININE 1.01 0.92 0.95  CALCIUM 9.2 9.5 9.4  TSH 0.02* 0.03* 0.37*   Liver Function Tests: Recent Labs    09/30/16 0859 02/21/17 1051 06/13/17 0846  AST _1 ALT 19 18 62*  ALKPHOS 60  --   --   BILITOT 0.5 0.4 0.4  PROT 6.3 6.4 6.5  ALBUMIN 4.1  --   --    No results for input(s): LIPASE, AMYLASE in the last 8760 hours. No results for input(s): AMMONIA in the last 8760 hours. CBC: No results for input(s): WBC, NEUTROABS, HGB, HCT, MCV, PLT in the last 8760 hours. Lipid Panel: Recent Labs    09/30/16 0859 02/21/17 1051 06/13/17 0846  CHOL 187 245* 264*  HDL 43* 42* 46*  LDLCALC 112*  --   --   TRIG 161* 329* 309*  CHOLHDL 4.3 5.8* 5.7*   Lab Results  Component Value Date   HGBA1C 5.6 06/13/2017    Procedures since last visit: No results found.  Assessment/Plan   ICD-10-CM   1. Contact with and (suspected) exposure to infections with a predominantly sexual mode of transmission Z20.2 RPR    HIV antibody (with reflex)    Hepatitis Acute Panel    HSV(herpes simplex vrs) 1+2 ab-IgG   occured 6 mos ago  2. Class 2 obesity without serious comorbidity with body mass index (BMI) of 35.0 to 35.9 in adult, unspecified obesity type E66.9 CMP with eGFR([facility])   Z68.35   3. Hypothyroidism due to acquired atrophy of thyroid E03.4 TSH    T4, Free  4. Essential hypertension I10 CMP with eGFR([facility])  5. Hyperlipidemia LDL goal <100 E78.5 Lipid Panel  6. Obsessive-compulsive disorder,  unspecified type F42.9   7. Social anxiety disorder F40.10     Will call with lab results  Follow up with GYN next week as scheduled  Continue current medications as ordered  Follow up with Dr Harlene Ramus as scheduled  Increase exercise as tolerated. Maintain healthy food choices  Follow up in 3 mos for obesity, HTN, thyroid, hyperlipidemia. Fasting labs prior to appt    Bergman S. Perlie Gold  [facility] [address] [place], [place] [address] [phone] Cell (Monday-Friday 8 AM - 5 PM) [phone] After 5 PM and follow prompts

## 2017-06-17 NOTE — Patient Instructions (Addendum)
Will call with lab results  Follow up with GYN next week as scheduled  Continue current medications as ordered  Follow up with Dr Harlene Ramus as scheduled  Follow up in 3 mos for obesity, HTN, thyroid, hyperlipidemia. Fasting labs prior to appt

## 2017-06-18 LAB — HSV(HERPES SIMPLEX VRS) I + II AB-IGG
HAV 1 IGG,TYPE SPECIFIC AB: 47.8 index — ABNORMAL HIGH
HSV 2 IGG,TYPE SPECIFIC AB: <0.9 index

## 2017-06-18 LAB — HEPATITIS PANEL, ACUTE
Hep A IgM: NONREACTIVE
Hep B C IgM: NONREACTIVE
Hepatitis B Surface Ag: NONREACTIVE
Hepatitis C Ab: NONREACTIVE
SIGNAL TO CUT-OFF: 0.03 (ref <1.00)

## 2017-06-18 LAB — HIV ANTIBODY (ROUTINE TESTING W REFLEX): HIV 1&2 Ab, 4th Generation: NONREACTIVE

## 2017-06-18 LAB — RPR: RPR Ser Ql: NONREACTIVE

## 2017-06-24 DIAGNOSIS — Z01419 Encounter for gynecological examination (general) (routine) without abnormal findings: Secondary | ICD-10-CM | POA: Diagnosis not present

## 2017-06-24 DIAGNOSIS — R69 Illness, unspecified: Secondary | ICD-10-CM | POA: Diagnosis not present

## 2017-06-24 DIAGNOSIS — Z6834 Body mass index (BMI) 34.0-34.9, adult: Secondary | ICD-10-CM | POA: Diagnosis not present

## 2017-06-24 DIAGNOSIS — N760 Acute vaginitis: Secondary | ICD-10-CM | POA: Diagnosis not present

## 2017-06-24 DIAGNOSIS — R82998 Other abnormal findings in urine: Secondary | ICD-10-CM | POA: Diagnosis not present

## 2017-06-24 DIAGNOSIS — Z113 Encounter for screening for infections with a predominantly sexual mode of transmission: Secondary | ICD-10-CM | POA: Diagnosis not present

## 2017-06-26 ENCOUNTER — Ambulatory Visit
Admission: RE | Admit: 2017-06-26 | Discharge: 2017-06-26 | Disposition: A | Discharge status: Home / Self Care | Payer: Medicare HMO | Source: Ambulatory Visit | Attending: Nurse Practitioner | Admitting: Nurse Practitioner

## 2017-06-26 ENCOUNTER — Encounter: Payer: Self-pay | Admitting: Nurse Practitioner

## 2017-06-26 ENCOUNTER — Ambulatory Visit (INDEPENDENT_AMBULATORY_CARE_PROVIDER_SITE_OTHER): Payer: Medicare HMO | Admitting: Nurse Practitioner

## 2017-06-26 VITALS — BP 124/80 | HR 80 | Temp 99.3°F | Ht 68.0 in | Wt 231.4 lb

## 2017-06-26 DIAGNOSIS — R05 Cough: Secondary | ICD-10-CM | POA: Diagnosis not present

## 2017-06-26 DIAGNOSIS — R0602 Shortness of breath: Secondary | ICD-10-CM

## 2017-06-26 DIAGNOSIS — J029 Acute pharyngitis, unspecified: Secondary | ICD-10-CM

## 2017-06-26 MED ORDER — AMOXICILLIN-POT CLAVULANATE 875-125 MG PO TABS: 1.0000 | ORAL_TABLET | Freq: Two times a day (BID) | ORAL | 0 refills | Status: DC

## 2017-06-26 NOTE — Patient Instructions (Addendum)
neti pot twice daily Plain nasal saline spray throughout the day as needed May use tylenol 325 mg 2 tablets every 6 hours as needed aches and pains or sore throat-- not to exceed 3000 mg from all sources humidifier in the home to help with the dry air Mucinex DM by mouth twice daily as needed for cough and congestion with full glass of water x 7 days then as needed  Keep well hydrated  To start Augmentin by mouth twice daily  Chest xray ordered

## 2017-06-26 NOTE — Progress Notes (Signed)
Careteam: Patient Care Team: Gildardo Cranker, DO as PCP - General (Internal Medicine) Elsie Stain, MD as Attending Physician (Pulmonary Disease) Irene Shipper, MD as Consulting Physician (Gastroenterology) Chucky May, MD as Consulting Physician (Psychiatry) Adrian Prows, MD as Consulting Physician (Cardiology) Dian Queen, MD as Consulting Physician (Obstetrics and Gynecology) Nicholaus Bloom, MD (Anesthesiology)  Advanced Directive information    No Known Allergies  Chief Complaint  Patient presents with  . Acute Visit    Pt is being seen due to sore throat for 5 days, head/chest congestion, SOB.      HPI: Patient is a 42 y.o. female seen in the office today due to sore throat.  Pt was here last week and feeling fine. For 4 days she has had sore throat that has been progressively getting worse. Ear pain- started yesterday, Feels like there is an ice pick in her ear.  No headache.  No known exposure to strept  Nonproductive cough.  Chest is hurting and she is easily out of breath.  Low grade fever 99.3 in office today, has not checked temperature at home.  Has not taken any ibuprofen and percocet (for back)  Nothing has helped her sore throat.  Throat feels like it is on fire.  Also reports nasal congestion.   Review of Systems:  Review of Systems  Constitutional: Positive for chills, fever and malaise/fatigue.  HENT: Positive for ear pain, sinus pain and sore throat.   Respiratory: Positive for cough and shortness of breath. Negative for sputum production.   Cardiovascular: Negative for chest pain and palpitations.  Neurological: Positive for weakness.    Past Medical History:  Diagnosis Date  . Anxiety   . Arthritis    ruptured lumbar disc-careful with positioning  . Blood dyscrasia    protein s deficiency-no treatment since 2006  . Depression   . GERD (gastroesophageal reflux disease)   . Headache(784.0)   . Hemorrhoids   . High cholesterol     . Hyperlipemia   . Hypertension   . Hypothyroidism   . IBS (irritable bowel syndrome)   . MVP (mitral valve prolapse)    echo per dr Dagmar Hait  . Protein S deficiency ([place]) 2003   dvt/pulmonary embolus-on coumadin for 6 months then off-Sees [facility]  . Pulmonary embolism ([place])   . PVC (premature ventricular contraction)    Past Surgical History:  Procedure Laterality Date  . CESAREAN SECTION  2006  . CHOLECYSTECTOMY N/A 11/03/2014   Procedure: LAPAROSCOPIC CHOLECYSTECTOMY WITH INTRAOPERATIVE CHOLANGIOGRAM;  Surgeon: Georganna Skeans, MD;  Location: [facility];  Service: General;  Laterality: N/A;  . LAPAROSCOPIC ASSISTED VAGINAL HYSTERECTOMY  03/27/2011   Procedure: LAPAROSCOPIC ASSISTED VAGINAL HYSTERECTOMY;  Surgeon: Cyril Mourning, MD;  Location: [facility];  Service: Gynecology;  Laterality: N/A;  . SALPINGOOPHORECTOMY  03/27/2011   Procedure: SALPINGO OOPHERECTOMY;  Surgeon: Cyril Mourning, MD;  Location: [facility];  Service: Gynecology;  Laterality: Bilateral;  . TONSILLECTOMY  92  . vaginal reconstructive surgery  2001   Social History:   reports that she quit smoking about 13 years ago. Her smoking use included cigarettes. She has a 15.00 pack-year smoking history. she has never used smokeless tobacco. She reports that she drinks alcohol. She reports that she does not use drugs.  Family History  Problem Relation Age of Onset  . High blood pressure Mother   . Hyperlipidemia Father        fathers side of family  . High blood pressure  Father   . Hypertension Other        entire family on both sides  . Asthma Son   . Asthma Son   . Clotting disorder Maternal Uncle   . Clotting disorder Paternal Grandmother   . Heart disease Maternal Grandfather     Medications: Patient's Medications  New Prescriptions   No medications on file  Previous Medications   ALPRAZOLAM (XANAX XR) 2 MG 24 HR TABLET    Take 2 mg by mouth 2 (two) times daily.   ALPRAZOLAM (XANAX) 1 MG TABLET    Take  2 mg by mouth at bedtime as needed.    ASPIRIN EC 81 MG TABLET    Take 81 mg by mouth daily.   BLOOD GLUCOSE MONITORING SUPPL (ONE TOUCH ULTRA SYSTEM KIT) W/DEVICE KIT    1 kit by Does not apply route once. Check blood sugar three times a week 2 hours after meals, call is BS >200 DX R73.03   BUPROPION (WELLBUTRIN XL) 300 MG 24 HR TABLET    Take 1 tablet by mouth daily.   CALCIUM CARBONATE (OS-CAL) 600 MG TABS    Take 600 mg by mouth daily.   COENZYME Q10 (COQ10) 100 MG CAPS    Take 100 mg by mouth daily.    DICYCLOMINE (BENTYL) 20 MG TABLET    TAKE 1 TABLET BY MOUTH 15 TO 20 MINUTES BEFORE EATING AS NEEDED   ESOMEPRAZOLE (NEXIUM) 40 MG CAPSULE    Take 1 capsule (40 mg total) by mouth daily.   ESTRADIOL (ESTRACE) 0.1 MG/GM VAGINAL CREAM    Place 1 Applicatorful vaginally 2 (two) times a week.   EZETIMIBE (ZETIA) 10 MG TABLET    TAKE 1 TABLET BY MOUTH EVERY DAY   FLUCONAZOLE (DIFLUCAN) 150 MG TABLET    Take 150 mg by mouth. As directed   FLUOXETINE (PROZAC) 40 MG CAPSULE    Take 80 mg by mouth daily.   FUROSEMIDE (LASIX) 20 MG TABLET    Take 20 mg by mouth daily as needed for fluid.    LAMOTRIGINE (LAMICTAL) 150 MG TABLET    Take 150 mg by mouth daily.   LOPERAMIDE (IMODIUM) 2 MG CAPSULE    Take by mouth as needed for diarrhea or loose stools.   LOSARTAN (COZAAR) 100 MG TABLET    TAKE 1 TABLET BY MOUTH EVERY DAY   METOPROLOL TARTRATE (LOPRESSOR) 25 MG TABLET    TAKE 1 TABLET BY MOUTH TWICE DAILY   METRONIDAZOLE (FLAGYL) 500 MG TABLET    Take 500 mg by mouth 2 (two) times daily.    MIRTAZAPINE (REMERON) 15 MG TABLET    Take 15 mg by mouth at bedtime.   MONTELUKAST (SINGULAIR) 10 MG TABLET    Take 10 mg by mouth at bedtime.   MULTIPLE VITAMINS-MINERALS (MULTIVITAMIN WITH MINERALS) TABLET    Take 1 tablet by mouth daily.    ONDANSETRON (ZOFRAN) 4 MG TABLET    TAKE 1 TABLET(4 MG) BY MOUTH EVERY 8 HOURS AS NEEDED FOR NAUSEA OR VOMITING   ONE TOUCH ULTRA TEST TEST STRIP    CHECK BLOOD SUGAR THREE TIMES  WEEKLY 2 HOURS AFTER MEALS, CALL DOCTOR IF BLOOD SUGAR GREATER THAN 466   ONETOUCH DELICA LANCETS 59D MISC    CHECK BLOOD SUGAR THREE TIMES A WEEK 2 HOURS AFTER MEALS. CALL DOCTOR IF BLOOD SUGAR GREATER THAN 200   OXYCODONE-ACETAMINOPHEN (PERCOCET) 7.5-325 MG TABLET    Take 1 tablet by mouth every 6 (six) hours  as needed for severe pain.   ROSUVASTATIN (CRESTOR) 20 MG TABLET    Take 1 tablet (20 mg total) by mouth daily.   SYNTHROID 125 MCG TABLET    TAKE 1 TABLET(125 MCG) BY MOUTH DAILY BEFORE BREAKFAST   VALACYCLOVIR (VALTREX) 1000 MG TABLET    Take 2 tabs po BID x 1 day for outbreaks as needed   VITAMIN C (ASCORBIC ACID) 500 MG TABLET    Take 500 mg by mouth daily.   VITAMIN D, ERGOCALCIFEROL, (DRISDOL) 50000 UNITS CAPS CAPSULE    TAKE 1 CAPSULE BY MOUTH EVERY 7 DAYS   VITAMIN E 400 UNIT CAPSULE    Take 400 Units by mouth daily.  Modified Medications   No medications on file  Discontinued Medications   No medications on file     Physical Exam:  Vitals:   06/26/17 1355  BP: 124/80  Pulse: 80  Temp: 99.3 F (37.4 C)  TempSrc: Oral  SpO2: 97%  Weight: 231 lb 6.4 oz (105 kg)  Height: 5' 8"  (1.727 m)   Body mass index is 35.18 kg/m.  Physical Exam  Constitutional: She is oriented to person, place, and time. She appears well-developed and well-nourished. No distress.  HENT:  Head: Normocephalic and atraumatic.  Right Ear: External ear normal.  Left Ear: External ear normal.  Nose: Nose normal.  Mouth/Throat: Posterior oropharyngeal erythema present. No posterior oropharyngeal edema or tonsillar abscesses.  Small white patch noted on left tonsil  Eyes: Conjunctivae and EOM are normal. Pupils are equal, round, and reactive to light.  Neck: Normal range of motion. Neck supple.  Cardiovascular: Normal rate, regular rhythm and normal heart sounds.  Pulmonary/Chest: Effort normal and breath sounds normal.  Musculoskeletal: She exhibits no edema or tenderness.  Lymphadenopathy:     She has no cervical adenopathy.  Neurological: She is alert and oriented to person, place, and time.  Skin: Skin is warm and dry. She is not diaphoretic.  Psychiatric: She has a normal mood and affect.    Labs reviewed: Basic Metabolic Panel: Recent Labs    09/30/16 0859 02/21/17 1051 06/13/17 0846  NA 139 139 137  K 4.4 5.0 4.4  CL 103 102 100  CO2 28 30 32  GLUCOSE 96 98 110*  BUN 11 10 17   CREATININE 1.01 0.92 0.95  CALCIUM 9.2 9.5 9.4  TSH 0.02* 0.03* 0.37*   Liver Function Tests: Recent Labs    09/30/16 0859 02/21/17 1051 06/13/17 0846  AST 17 16 28   ALT 19 18 62*  ALKPHOS 60  --   --   BILITOT 0.5 0.4 0.4  PROT 6.3 6.4 6.5  ALBUMIN 4.1  --   --    No results for input(s): LIPASE, AMYLASE in the last 8760 hours. No results for input(s): AMMONIA in the last 8760 hours. CBC: No results for input(s): WBC, NEUTROABS, HGB, HCT, MCV, PLT in the last 8760 hours. Lipid Panel: Recent Labs    09/30/16 0859 02/21/17 1051 06/13/17 0846  CHOL 187 245* 264*  HDL 43* 42* 46*  LDLCALC 112*  --   --   TRIG 161* 329* 309*  CHOLHDL 4.3 5.8* 5.7*   TSH: Recent Labs    09/30/16 0859 02/21/17 1051 06/13/17 0846  TSH 0.02* 0.03* 0.37*   A1C: Lab Results  Component Value Date   HGBA1C 5.6 06/13/2017     Assessment/Plan 1. Sore throat -unable to test for strept at this time, unlikely given other symptoms however due to  sinus congestion with sore throat will treat with  amoxicillin-clavulanate (AUGMENTIN) 875-125 MG tablet; Take 1 tablet by mouth 2 (two) times daily.  Dispense: 14 tablet; Refill: 0 - may use tylenol as needed not to exceed 3 gm from all sources in 24 hours.  -mucinex DM by mouth twice daily  For 7 days then as needed, increase hydration.  2. Shortness of breath - DG Chest 2 View rule out pneumonia.  - amoxicillin-clavulanate (AUGMENTIN) 875-125 MG tablet; Take 1 tablet by mouth 2 (two) times daily.  Dispense: 14 tablet; Refill: 0  Return  precautions given.  Carlos American. Harle Battiest  [facility] [phone] 8 am - 5 pm) [phone] (after hours)

## 2017-07-07 ENCOUNTER — Other Ambulatory Visit: Payer: Self-pay | Admitting: Internal Medicine

## 2017-07-07 DIAGNOSIS — E559 Vitamin D deficiency, unspecified: Secondary | ICD-10-CM

## 2017-07-07 DIAGNOSIS — E034 Atrophy of thyroid (acquired): Secondary | ICD-10-CM

## 2017-07-10 DIAGNOSIS — M5416 Radiculopathy, lumbar region: Secondary | ICD-10-CM | POA: Diagnosis not present

## 2017-07-10 DIAGNOSIS — Z79891 Long term (current) use of opiate analgesic: Secondary | ICD-10-CM | POA: Diagnosis not present

## 2017-07-10 DIAGNOSIS — G894 Chronic pain syndrome: Secondary | ICD-10-CM | POA: Diagnosis not present

## 2017-07-10 DIAGNOSIS — M4726 Other spondylosis with radiculopathy, lumbar region: Secondary | ICD-10-CM | POA: Diagnosis not present

## 2017-07-31 ENCOUNTER — Telehealth: Payer: Self-pay | Admitting: Internal Medicine

## 2017-07-31 DIAGNOSIS — Z0100 Encounter for examination of eyes and vision without abnormal findings: Secondary | ICD-10-CM | POA: Diagnosis not present

## 2017-07-31 DIAGNOSIS — H5213 Myopia, bilateral: Secondary | ICD-10-CM | POA: Diagnosis not present

## 2017-07-31 NOTE — Telephone Encounter (Signed)
Left msg asking pt to call me at [phone] to confirm AWV-S appt w/ labs on 09/15/17.Marland Kitchen VDM (DD)

## 2017-08-05 ENCOUNTER — Other Ambulatory Visit: Payer: Self-pay | Admitting: Internal Medicine

## 2017-09-02 DIAGNOSIS — Z79891 Long term (current) use of opiate analgesic: Secondary | ICD-10-CM | POA: Diagnosis not present

## 2017-09-02 DIAGNOSIS — M4726 Other spondylosis with radiculopathy, lumbar region: Secondary | ICD-10-CM | POA: Diagnosis not present

## 2017-09-02 DIAGNOSIS — G894 Chronic pain syndrome: Secondary | ICD-10-CM | POA: Diagnosis not present

## 2017-09-02 DIAGNOSIS — M5416 Radiculopathy, lumbar region: Secondary | ICD-10-CM | POA: Diagnosis not present

## 2017-09-04 ENCOUNTER — Other Ambulatory Visit: Payer: Self-pay | Admitting: Internal Medicine

## 2017-09-04 NOTE — Telephone Encounter (Signed)
Pt is requesting an increase in dose. She's asking for 40mg  3 times a day. She's still having some trouble.

## 2017-09-05 DIAGNOSIS — R69 Illness, unspecified: Secondary | ICD-10-CM | POA: Diagnosis not present

## 2017-09-08 ENCOUNTER — Telehealth: Payer: Self-pay | Admitting: Internal Medicine

## 2017-09-08 NOTE — Telephone Encounter (Signed)
Left msg asking pt to confirm AWV-S with labs. Last AWV 08/08/16. VDM (DD)

## 2017-09-15 ENCOUNTER — Ambulatory Visit (INDEPENDENT_AMBULATORY_CARE_PROVIDER_SITE_OTHER): Payer: Medicare HMO

## 2017-09-15 ENCOUNTER — Other Ambulatory Visit: Payer: Self-pay

## 2017-09-15 ENCOUNTER — Other Ambulatory Visit: Payer: Medicare HMO

## 2017-09-15 VITALS — BP 115/78 | HR 67 | Temp 98.2°F | Ht 68.0 in | Wt 231.0 lb

## 2017-09-15 DIAGNOSIS — E785 Hyperlipidemia, unspecified: Secondary | ICD-10-CM | POA: Diagnosis not present

## 2017-09-15 DIAGNOSIS — E034 Atrophy of thyroid (acquired): Secondary | ICD-10-CM | POA: Diagnosis not present

## 2017-09-15 DIAGNOSIS — Z0000 Encounter for general adult medical examination without abnormal findings: Secondary | ICD-10-CM | POA: Diagnosis not present

## 2017-09-15 DIAGNOSIS — I10 Essential (primary) hypertension: Secondary | ICD-10-CM | POA: Diagnosis not present

## 2017-09-15 DIAGNOSIS — E669 Obesity, unspecified: Secondary | ICD-10-CM | POA: Diagnosis not present

## 2017-09-15 DIAGNOSIS — Z6835 Body mass index (BMI) 35.0-35.9, adult: Secondary | ICD-10-CM | POA: Diagnosis not present

## 2017-09-15 LAB — COMPLETE METABOLIC PANEL WITH GFR
AG Ratio: 1.8 (calc) (ref 1.0–2.5)
ALT: 20 U/L (ref 6–29)
AST: 21 U/L (ref 10–30)
Albumin: 4.1 g/dL (ref 3.6–5.1)
Alkaline phosphatase (APISO): 73 U/L (ref 33–115)
BUN: 15 mg/dL (ref 7–25)
CO2: 28 mmol/L (ref 20–32)
Calcium: 10.1 mg/dL (ref 8.6–10.2)
Chloride: 100 mmol/L (ref 98–110)
Creat: 0.92 mg/dL (ref 0.50–1.10)
GFR, Est African American: 88 mL/min/{1.73_m2} (ref >=60)
GFR, Est Non African American: 76 mL/min/{1.73_m2} (ref >=60)
Globulin: 2.3 g/dL (calc) (ref 1.9–3.7)
Glucose, Bld: 100 mg/dL — ABNORMAL HIGH (ref 65–99)
Potassium: 4.6 mmol/L (ref 3.5–5.3)
Sodium: 138 mmol/L (ref 135–146)
Total Bilirubin: 0.5 mg/dL (ref 0.2–1.2)
Total Protein: 6.4 g/dL (ref 6.1–8.1)

## 2017-09-15 LAB — LIPID PANEL
Cholesterol: 224 mg/dL — ABNORMAL HIGH (ref <200)
HDL: 39 mg/dL — ABNORMAL LOW (ref >50)
LDL Cholesterol (Calc): 133 mg/dL (calc) — ABNORMAL HIGH
Non-HDL Cholesterol (Calc): 185 mg/dL (calc) — ABNORMAL HIGH (ref <130)
Total CHOL/HDL Ratio: 5.7 (calc) — ABNORMAL HIGH (ref <5.0)
Triglycerides: 366 mg/dL — ABNORMAL HIGH (ref <150)

## 2017-09-15 LAB — T4, FREE: Free T4: 1.6 ng/dL (ref 0.8–1.8)

## 2017-09-15 LAB — TSH: TSH: 0.27 mIU/L — ABNORMAL LOW

## 2017-09-15 NOTE — Patient Instructions (Signed)
Ms. Andrea [name] , Thank you for taking time to come for your Medicare Wellness Visit. I appreciate your ongoing commitment to your health goals. Please review the following plan we discussed and let me know if I can assist you in the future.   Screening recommendations/referrals: Colonoscopy up to date Mammogram up to date, due 02/11/2018 Bone Density excluded, due at age 42 Recommended yearly ophthalmology/optometry visit for glaucoma screening and checkup Recommended yearly dental visit for hygiene and checkup  Vaccinations: Influenza vaccine up to date, due 2019 fall season Pneumococcal vaccine excluded, due at age 44 Tdap vaccine up to date, due 01/08/2024 Shingles vaccine excluded, due at age 42    Advanced directives: Need a copy for chart  Conditions/risks identified: none  Next appointment: Dr. Eulas Post 09/17/2017 @ 1:45pm            Tyson Dense, RN 09/18/2018 @ 12:45pm  Preventive Care 44-64 Years, Female Preventive care refers to lifestyle choices and visits with your health care provider that can promote health and wellness. What does preventive care include?  A yearly physical exam. This is also called an annual well check.  Dental exams once or twice a year.  Routine eye exams. Ask your health care provider how often you should have your eyes checked.  Personal lifestyle choices, including:  Daily care of your teeth and gums.  Regular physical activity.  Eating a healthy diet.  Avoiding tobacco and drug use.  Limiting alcohol use.  Practicing safe sex.  Taking low-dose aspirin daily starting at age 44.  Taking vitamin and mineral supplements as recommended by your health care provider. What happens during an annual well check? The services and screenings done by your health care provider during your annual well check will depend on your age, overall health, lifestyle risk factors, and family history of disease. Counseling  Your health care provider may ask you  questions about your:  Alcohol use.  Tobacco use.  Drug use.  Emotional well-being.  Home and relationship well-being.  Sexual activity.  Eating habits.  Work and work Statistician.  Method of birth control.  Menstrual cycle.  Pregnancy history. Screening  You may have the following tests or measurements:  Height, weight, and BMI.  Blood pressure.  Lipid and cholesterol levels. These may be checked every 5 years, or more frequently if you are over 73 years old.  Skin check.  Lung cancer screening. You may have this screening every year starting at age 44 if you have a 30-pack-year history of smoking and currently smoke or have quit within the past 15 years.  Fecal occult blood test (FOBT) of the stool. You may have this test every year starting at age 44.  Flexible sigmoidoscopy or colonoscopy. You may have a sigmoidoscopy every 5 years or a colonoscopy every 10 years starting at age 44.  Hepatitis C blood test.  Hepatitis B blood test.  Sexually transmitted disease (STD) testing.  Diabetes screening. This is done by checking your blood sugar (glucose) after you have not eaten for a while (fasting). You may have this done every 1-3 years.  Mammogram. This may be done every 1-2 years. Talk to your health care provider about when you should start having regular mammograms. This may depend on whether you have a family history of breast cancer.  BRCA-related cancer screening. This may be done if you have a family history of breast, ovarian, tubal, or peritoneal cancers.  Pelvic exam and Pap test. This may be done every  3 years starting at age 44. Starting at age 37, this may be done every 5 years if you have a Pap test in combination with an HPV test.  Bone density scan. This is done to screen for osteoporosis. You may have this scan if you are at high risk for osteoporosis. Discuss your test results, treatment options, and if necessary, the need for more tests with  your health care provider. Vaccines  Your health care provider may recommend certain vaccines, such as:  Influenza vaccine. This is recommended every year.  Tetanus, diphtheria, and acellular pertussis (Tdap, Td) vaccine. You may need a Td booster every 10 years.  Zoster vaccine. You may need this after age 44.  Pneumococcal 13-valent conjugate (PCV13) vaccine. You may need this if you have certain conditions and were not previously vaccinated.  Pneumococcal polysaccharide (PPSV23) vaccine. You may need one or two doses if you smoke cigarettes or if you have certain conditions. Talk to your health care provider about which screenings and vaccines you need and how often you need them. This information is not intended to replace advice given to you by your health care provider. Make sure you discuss any questions you have with your health care provider. Document Released: 06/02/2015 Document Revised: 01/24/2016 Document Reviewed: 03/07/2015 Elsevier Interactive Patient Education  2017 [employer] Prevention in the Home Falls can cause injuries. They can happen to people of all ages. There are many things you can do to make your home safe and to help prevent falls. What can I do on the outside of my home?  Regularly fix the edges of walkways and driveways and fix any cracks.  Remove anything that might make you trip as you walk through a door, such as a raised step or threshold.  Trim any bushes or trees on the path to your home.  Use bright outdoor lighting.  Clear any walking paths of anything that might make someone trip, such as rocks or tools.  Regularly check to see if handrails are loose or broken. Make sure that both sides of any steps have handrails.  Any raised decks and porches should have guardrails on the edges.  Have any leaves, snow, or ice cleared regularly.  Use sand or salt on walking paths during winter.  Clean up any spills in your garage right  away. This includes oil or grease spills. What can I do in the bathroom?  Use night lights.  Install grab bars by the toilet and in the tub and shower. Do not use towel bars as grab bars.  Use non-skid mats or decals in the tub or shower.  If you need to sit down in the shower, use a plastic, non-slip stool.  Keep the floor dry. Clean up any water that spills on the floor as soon as it happens.  Remove soap buildup in the tub or shower regularly.  Attach bath mats securely with double-sided non-slip rug tape.  Do not have throw rugs and other things on the floor that can make you trip. What can I do in the bedroom?  Use night lights.  Make sure that you have a light by your bed that is easy to reach.  Do not use any sheets or blankets that are too big for your bed. They should not hang down onto the floor.  Have a firm chair that has side arms. You can use this for support while you get dressed.  Do not have throw  rugs and other things on the floor that can make you trip. What can I do in the kitchen?  Clean up any spills right away.  Avoid walking on wet floors.  Keep items that you use a lot in easy-to-reach places.  If you need to reach something above you, use a strong step stool that has a grab bar.  Keep electrical cords out of the way.  Do not use floor polish or wax that makes floors slippery. If you must use wax, use non-skid floor wax.  Do not have throw rugs and other things on the floor that can make you trip. What can I do with my stairs?  Do not leave any items on the stairs.  Make sure that there are handrails on both sides of the stairs and use them. Fix handrails that are broken or loose. Make sure that handrails are as long as the stairways.  Check any carpeting to make sure that it is firmly attached to the stairs. Fix any carpet that is loose or worn.  Avoid having throw rugs at the top or bottom of the stairs. If you do have throw rugs, attach  them to the floor with carpet tape.  Make sure that you have a light switch at the top of the stairs and the bottom of the stairs. If you do not have them, ask someone to add them for you. What else can I do to help prevent falls?  Wear shoes that:  Do not have high heels.  Have rubber bottoms.  Are comfortable and fit you well.  Are closed at the toe. Do not wear sandals.  If you use a stepladder:  Make sure that it is fully opened. Do not climb a closed stepladder.  Make sure that both sides of the stepladder are locked into place.  Ask someone to hold it for you, if possible.  Clearly mark and make sure that you can see:  Any grab bars or handrails.  First and last steps.  Where the edge of each step is.  Use tools that help you move around (mobility aids) if they are needed. These include:  Canes.  Walkers.  Scooters.  Crutches.  Turn on the lights when you go into a dark area. Replace any light bulbs as soon as they burn out.  Set up your furniture so you have a clear path. Avoid moving your furniture around.  If any of your floors are uneven, fix them.  If there are any pets around you, be aware of where they are.  Review your medicines with your doctor. Some medicines can make you feel dizzy. This can increase your chance of falling. Ask your doctor what other things that you can do to help prevent falls. This information is not intended to replace advice given to you by your health care provider. Make sure you discuss any questions you have with your health care provider. Document Released: 03/02/2009 Document Revised: 10/12/2015 Document Reviewed: 06/10/2014 Elsevier Interactive Patient Education  2017 [employer].

## 2017-09-15 NOTE — Progress Notes (Addendum)
Subjective:   Andrea [name] is a 42 y.o. female who presents for Medicare Annual (Subsequent) preventive examination.  Last AWV-08/08/2016       Objective:     Vitals: BP 115/78 (BP Location: Right Arm, Patient Position: Sitting)   Pulse 67   Temp 98.2 F (36.8 C) (Oral)   Ht _0  (1.727 m)   Wt 231 lb (104.8 kg)   LMP 03/19/2011   SpO2 97%   BMI 35.12 kg/m   Body mass index is 35.12 kg/m.  Advanced Directives 09/15/2017 02/21/2017 10/02/2016 08/08/2016 05/31/2016 12/15/2015 10/27/2015  Does Patient Have a Medical Advance Directive? _1  No No  Would patient like information on creating a medical advance directive? No - Patient declined - No - Patient declined Yes (MAU/Ambulatory/Procedural Areas - Information given) No - Patient declined No - patient declined information No - patient declined information  Pre-existing out of facility DNR order (yellow form or pink MOST form) - - - - - - -    Tobacco Social History   Tobacco Use  Smoking Status Former Smoker  . Packs/day: 1.00  . Years: 15.00  . Pack years: 15.00  . Types: Cigarettes  . Last attempt to quit: 03/18/2004  . Years since quitting: 13.5  Smokeless Tobacco Never Used     Counseling given: Not Answered   Clinical Intake:  Pre-visit preparation completed: No  Pain : 0-10 Pain Score: 6  Pain Type: Chronic pain Pain Location: Back Pain Orientation: Lower Pain Descriptors / Indicators: Aching Pain Onset: More than a month ago Pain Frequency: Intermittent     Nutritional Risks: None Diabetes: No  How often do you need to have someone help you when you read instructions, pamphlets, or other written materials from your doctor or pharmacy?: 1 - Never What is the last grade level you completed in school?: College  Interpreter Needed?: No  Information entered by :: Zenia Resides  Past Medical History:  Diagnosis Date  . Anxiety   . Arthritis    ruptured lumbar disc-careful with  positioning  . Blood dyscrasia    protein s deficiency-no treatment since 2006  . Depression   . GERD (gastroesophageal reflux disease)   . Headache(784.0)   . Hemorrhoids   . High cholesterol   . Hyperlipemia   . Hypertension   . Hypothyroidism   . IBS (irritable bowel syndrome)   . MVP (mitral valve prolapse)    echo per dr Dagmar Hait  . Protein S deficiency ([place]) 2003   dvt/pulmonary embolus-on coumadin for 6 months then off-Sees [facility]  . Pulmonary embolism ([place])   . PVC (premature ventricular contraction)    Past Surgical History:  Procedure Laterality Date  . CESAREAN SECTION  2006  . CHOLECYSTECTOMY N/A 11/03/2014   Procedure: LAPAROSCOPIC CHOLECYSTECTOMY WITH INTRAOPERATIVE CHOLANGIOGRAM;  Surgeon: Georganna Skeans, MD;  Location: [facility];  Service: General;  Laterality: N/A;  . LAPAROSCOPIC ASSISTED VAGINAL HYSTERECTOMY  03/27/2011   Procedure: LAPAROSCOPIC ASSISTED VAGINAL HYSTERECTOMY;  Surgeon: Cyril Mourning, MD;  Location: [facility];  Service: Gynecology;  Laterality: N/A;  . SALPINGOOPHORECTOMY  03/27/2011   Procedure: SALPINGO OOPHERECTOMY;  Surgeon: Cyril Mourning, MD;  Location: [facility];  Service: Gynecology;  Laterality: Bilateral;  . TONSILLECTOMY  92  . vaginal reconstructive surgery  2001   Family History  Problem Relation Age of Onset  . High blood pressure Mother   . Hyperlipidemia Father        fathers  side of family  . High blood pressure Father   . Hypertension Other        entire family on both sides  . Asthma Son   . Asthma Son   . Clotting disorder Maternal Uncle   . Clotting disorder Paternal Grandmother   . Heart disease Maternal Grandfather    Social History   Socioeconomic History  . Marital status: Divorced    Spouse name: Not on file  . Number of children: 2  . Years of education: Not on file  . Highest education level: Not on file  Occupational History  . Occupation: nutrition    Employer: [employer]  Social  Needs  . Financial resource strain: Not hard at all  . Food insecurity:    Worry: Never true    Inability: Never true  . Transportation needs:    Medical: No    Non-medical: No  Tobacco Use  . Smoking status: Former Smoker    Packs/day: 1.00    Years: 15.00    Pack years: 15.00    Types: Cigarettes    Last attempt to quit: 03/18/2004    Years since quitting: 13.5  . Smokeless tobacco: Never Used  Substance and Sexual Activity  . Alcohol use: Never    Frequency: Never  . Drug use: No  . Sexual activity: Yes  Lifestyle  . Physical activity:    Days per week: 3 days    Minutes per session: 30 min  . Stress: To some extent  Relationships  . Social connections:    Talks on phone: More than three times a week    Gets together: More than three times a week    Attends religious service: Never    Active member of club or organization: No    Attends meetings of clubs or organizations: Never    Relationship status: Divorced  Other Topics Concern  . Not on file  Social History Narrative   Diet: Regular   Caffeine: Yes   Married- divorced, married in 2006   House: Yes, 2 persons   Pets: 1 dog   Current/Past profession: [employer] 2002-2013   Exercise: Yes, walking   Living Will: No    DNR: No    POA/HPOA: No        Outpatient Encounter Medications as of 09/15/2017  Medication Sig  . ALPRAZolam (XANAX XR) 2 MG 24 hr tablet Take 2 mg by mouth 2 (two) times daily.  Marland Kitchen ALPRAZolam (XANAX) 1 MG tablet Take 2 mg by mouth at bedtime as needed.   Marland Kitchen aspirin EC 81 MG tablet Take 81 mg by mouth daily.  . Blood Glucose Monitoring Suppl (ONE TOUCH ULTRA SYSTEM KIT) w/Device KIT 1 kit by Does not apply route once. Check blood sugar three times a week 2 hours after meals, call is BS >200 DX R73.03  . Brexpiprazole (REXULTI PO) Take 1 tablet by mouth daily.  . calcium carbonate (OS-CAL) 600 MG TABS Take 600 mg by mouth daily.  . Coenzyme Q10 (COQ10) 100 MG CAPS Take 100 mg by mouth daily.    Marland Kitchen dicyclomine (BENTYL) 20 MG tablet TAKE 1 TABLET BY MOUTH 15 TO 20 MINUTES BEFORE EATING AS NEEDED  . estradiol (ESTRACE) 0.1 MG/GM vaginal cream Place 1 Applicatorful vaginally 2 (two) times a week.  . ezetimibe (ZETIA) 10 MG tablet TAKE 1 TABLET BY MOUTH EVERY DAY  . fluconazole (DIFLUCAN) 150 MG tablet Take 150 mg by mouth. As directed  .  FLUoxetine (PROZAC) 40 MG capsule Take 80 mg by mouth daily.  Marland Kitchen lamoTRIgine (LAMICTAL) 150 MG tablet Take 150 mg by mouth daily.  Marland Kitchen loperamide (IMODIUM) 2 MG capsule Take by mouth as needed for diarrhea or loose stools.  Marland Kitchen losartan (COZAAR) 100 MG tablet TAKE 1 TABLET BY MOUTH EVERY DAY  . metoprolol tartrate (LOPRESSOR) 25 MG tablet TAKE 1 TABLET BY MOUTH TWICE DAILY  . montelukast (SINGULAIR) 10 MG tablet Take 10 mg by mouth at bedtime.  . Multiple Vitamins-Minerals (MULTIVITAMIN WITH MINERALS) tablet Take 1 tablet by mouth daily.   . ondansetron (ZOFRAN) 4 MG tablet TAKE 1 TABLET(4 MG) BY MOUTH EVERY 8 HOURS AS NEEDED FOR NAUSEA OR VOMITING  . ONE TOUCH ULTRA TEST test strip CHECK BLOOD SUGAR THREE TIMES WEEKLY 2 HOURS AFTER MEALS, CALL DOCTOR IF BLOOD SUGAR GREATER THAN 200  . ONETOUCH DELICA LANCETS 85I MISC CHECK BLOOD SUGAR THREE TIMES A WEEK 2 HOURS AFTER MEALS. CALL DOCTOR IF BLOOD SUGAR GREATER THAN 200  . oxyCODONE-acetaminophen (PERCOCET) 7.5-325 MG tablet Take 1 tablet by mouth every 6 (six) hours as needed for severe pain.  . rosuvastatin (CRESTOR) 20 MG tablet Take 1 tablet (20 mg total) by mouth daily.  Marland Kitchen SYNTHROID 125 MCG tablet TAKE 1 TABLET(125 MCG) BY MOUTH DAILY BEFORE BREAKFAST  . trazodone (DESYREL) 300 MG tablet Take 300 mg by mouth at bedtime.  . valACYclovir (VALTREX) 1000 MG tablet Take 2 tabs po BID x 1 day for outbreaks as needed  . vitamin C (ASCORBIC ACID) 500 MG tablet Take 500 mg by mouth daily.  . Vitamin D, Ergocalciferol, (DRISDOL) 50000 units CAPS capsule TAKE 1 CAPSULE BY MOUTH EVERY 7 DAYS  . vitamin E 400 UNIT  capsule Take 400 Units by mouth daily.  . [DISCONTINUED] furosemide (LASIX) 20 MG tablet Take 20 mg by mouth daily as needed for fluid.   . [DISCONTINUED] metroNIDAZOLE (FLAGYL) 500 MG tablet Take 500 mg by mouth 2 (two) times daily.   Marland Kitchen esomeprazole (NEXIUM) 40 MG capsule Take 1 capsule (40 mg total) by mouth daily. (Patient not taking: Reported on 09/15/2017)  . [DISCONTINUED] amoxicillin-clavulanate (AUGMENTIN) 875-125 MG tablet Take 1 tablet by mouth 2 (two) times daily.  . [DISCONTINUED] buPROPion (WELLBUTRIN XL) 300 MG 24 hr tablet Take 1 tablet by mouth daily.  . [DISCONTINUED] mirtazapine (REMERON) 15 MG tablet Take 15 mg by mouth at bedtime.   No facility-administered encounter medications on file as of 09/15/2017.     Activities of Daily Living In your present state of health, do you have any difficulty performing the following activities: 09/15/2017  Hearing? N  Vision? N  Difficulty concentrating or making decisions? Y  Walking or climbing stairs? N  Dressing or bathing? N  Doing errands, shopping? N  Preparing Food and eating ? N  Using the Toilet? N  In the past six months, have you accidently leaked urine? Y  Do you have problems with loss of bowel control? N  Managing your Medications? N  Managing your Finances? N  Housekeeping or managing your Housekeeping? N  Some recent data might be hidden    Patient Care Team: Gildardo Cranker, DO as PCP - General (Internal Medicine) Elsie Stain, MD as Attending Physician (Pulmonary Disease) Irene Shipper, MD as Consulting Physician (Gastroenterology) Chucky May, MD as Consulting Physician (Psychiatry) Adrian Prows, MD as Consulting Physician (Cardiology) Dian Queen, MD as Consulting Physician (Obstetrics and Gynecology) Nicholaus Bloom, MD (Anesthesiology)    Assessment:  This is a routine wellness examination for [place].  Exercise Activities and Dietary recommendations Current Exercise Habits: Home exercise  routine, Type of exercise: Other - see comments(pilaties), Time (Minutes): 30, Frequency (Times/Week): 3, Weekly Exercise (Minutes/Week): 90, Exercise limited by: None identified  Goals    None      Fall Risk Fall Risk  09/15/2017 06/26/2017 06/17/2017 02/21/2017 10/02/2016  Falls in the past year? _0   Number falls in past yr: 2 or more 2 or more 2 or more 2 or more 2 or more  Comment - - - 5-6 times this year due to vertigo -  Injury with Fall? _1   Comment back - Broke toes on R foot - -   Is the patient's home free of loose throw rugs in walkways, pet beds, electrical cords, etc?   yes      Grab bars in the bathroom? no      Handrails on the stairs?   yes      Adequate lighting?   yes  Timed Get Up and Go performed: 15 seconds  Depression Screen PHQ 2/9 Scores 09/15/2017 10/02/2016 08/08/2016 12/15/2015  PHQ - 2 Score _2 0  PHQ- 9 Score 12 - 12 -     Cognitive Function: within normal function MMSE - Mini Mental State Exam 09/15/2017  Not completed: Unable to complete        Immunization History  Administered Date(s) Administered  . Influenza Split 02/18/2012  . Influenza,inj,Quad PF,6+ Mos 02/01/2015, 02/23/2016, 02/21/2017  . Tdap 01/07/2014    Qualifies for Shingles Vaccine? No, due at age 4  Screening Tests Health Maintenance  Topic Date Due  . INFLUENZA VACCINE  12/18/2017  . PAP SMEAR  02/14/2018  . TETANUS/TDAP  01/08/2024  . HIV Screening  Completed    Cancer Screenings: Lung: Low Dose CT Chest recommended if Age 59-80 years, 30 pack-year currently smoking OR have quit w/in 15years. Patient does not qualify. Breast:  Up to date on Mammogram? Yes   Up to date of Bone Density/Dexa? Yes Colorectal: up to date  Additional Screenings:  Hepatitis C Screening: declined PNA vaccines due at age 76     Plan:    I have personally reviewed and addressed the Medicare Annual Wellness questionnaire and have noted the following in  the patient's chart:  A. Medical and social history B. Use of alcohol, tobacco or illicit drugs  C. Current medications and supplements D. Functional ability and status E.  Nutritional status F.  Physical activity G. Advance directives H. List of other physicians I.  Hospitalizations, surgeries, and ER visits in previous 12 months J.  [place] to include hearing, vision, cognitive, depression L. Referrals and appointments - none  In addition, I have reviewed and discussed with patient certain preventive protocols, quality metrics, and best practice recommendations. A written personalized care plan for preventive services as well as general preventive health recommendations were provided to patient.  See attached scanned questionnaire for additional information.   Signed,   Tyson Dense, RN Nurse Health Advisor  Patient Concerns: None

## 2017-09-17 ENCOUNTER — Ambulatory Visit (INDEPENDENT_AMBULATORY_CARE_PROVIDER_SITE_OTHER): Payer: Medicare HMO | Admitting: Internal Medicine

## 2017-09-17 ENCOUNTER — Encounter: Payer: Self-pay | Admitting: Internal Medicine

## 2017-09-17 VITALS — BP 122/78 | HR 67 | Temp 98.3°F | Ht 68.0 in | Wt 232.0 lb

## 2017-09-17 DIAGNOSIS — K219 Gastro-esophageal reflux disease without esophagitis: Secondary | ICD-10-CM | POA: Diagnosis not present

## 2017-09-17 DIAGNOSIS — Z6835 Body mass index (BMI) 35.0-35.9, adult: Secondary | ICD-10-CM

## 2017-09-17 DIAGNOSIS — I10 Essential (primary) hypertension: Secondary | ICD-10-CM

## 2017-09-17 DIAGNOSIS — E785 Hyperlipidemia, unspecified: Secondary | ICD-10-CM

## 2017-09-17 DIAGNOSIS — Z202 Contact with and (suspected) exposure to infections with a predominantly sexual mode of transmission: Secondary | ICD-10-CM

## 2017-09-17 DIAGNOSIS — R69 Illness, unspecified: Secondary | ICD-10-CM | POA: Diagnosis not present

## 2017-09-17 DIAGNOSIS — E034 Atrophy of thyroid (acquired): Secondary | ICD-10-CM

## 2017-09-17 DIAGNOSIS — E669 Obesity, unspecified: Secondary | ICD-10-CM | POA: Diagnosis not present

## 2017-09-17 MED ORDER — ESOMEPRAZOLE MAGNESIUM 40 MG PO CPDR: 40.0000 mg | DELAYED_RELEASE_CAPSULE | Freq: Every day | ORAL | 3 refills | Status: DC

## 2017-09-17 NOTE — Progress Notes (Signed)
Patient ID: Andrea [name], female   DOB: [birth year], 42 y.o.   MRN: [id number]   Location:  [facility] OFFICE  Provider: DR Arletha Grippe  Code Status: FULL CODE Goals of Care:  Advanced Directives 09/15/2017  Does Patient Have a Medical Advance Directive? No  Would patient like information on creating a medical advance directive? No - Patient declined  Pre-existing out of facility DNR order (yellow form or pink MOST form) -     Chief Complaint  Patient presents with  . Medical Management of Chronic Issues    3 month follow-up on HTN, thyrid and hyperlipidemia. Discuss labs, copy given to patient.  AWV completed 09/15/17, + fall risk- per patient related to vertigo. Patient c/o acid reflux and bloating (ran out of nexium)   . Medication Refill    Nexium or alternative for acid reflux   . Orders    Repeat STI labs, patient would like to make sure Hep C is checked for she has a sexual encounter with someone that has/had Hep C     HPI: Patient is a 42 y.o. female seen today for medical management of chronic diseases.  She needs repeat labs on STI due to exposure 6 mos ago. Labs last checked Jun 17, 2017 and were neg. She reports increased stressors due to son ill and was dx with Raynaud's. She saw psych Dr Toy Care and her meds were adjusted. AWV completed 09/15/17.   She is c/a wt gain. No change in lifestyle.   She is c/a STI exposure - she was using condoms but it broke x 2. Partner informed her that he has herpes and hepatitis. STI tests in jan 2019 neg. GYN would like labs retestes. No new rashes  Obesity - weight stable. ALT 20 (prev 62). She maintains healthy food choices and exercise daily.  Hypothyroidsm - stable. She takes brand synthroid 125 mcg daily. TSH 0.27; T4 free 1.6. Weight is stable  BP stable at home on lasix, losartan and lopressor. She takes ASA daily  Hyperlipidemia - stale on zetia/crestor. LDL 112; TG 161; Tchol 187  Vit D defic - improved. Vit D 36 on  supplement  Allergic rhinitis - stable on prn singulair  Prediabetes - A1c 5.4% in Jan 2018  Diarrhea/GERD/IBS - uncontrolled sx's with bloating, increased acid reflux, and chest burning and hoarseness. takes [place]. She was sporadically taking nexium. Takes prn zofran. Followed by GI  OCD/social anxiety/MDD - mood stable on fluoxetine, lamictal, rexulti. Off wellbutrin sr. Clonazepam changed to alprazolam XR BID.  She has an autistic son. Sertraline stopped due to ADRs.  Followed by psych Dr Harlene Ramus.  In the past, she discovered that her ex husband is IVDA and shared needles with Hep C and HIV (+) individual. Last sexual contact in 2010. She had HIV test at [facility] dept that was neg. Neg Hepatitis panel. RUQ US showed fatty liver  She is a poor historian due to psych dx. Hx obtained from chart  Past Medical History:  Diagnosis Date  . Anxiety   . Arthritis    ruptured lumbar disc-careful with positioning  . Blood dyscrasia    protein s deficiency-no treatment since 2006  . Depression   . GERD (gastroesophageal reflux disease)   . Headache(784.0)   . Hemorrhoids   . High cholesterol   . Hyperlipemia   . Hypertension   . Hypothyroidism   . IBS (irritable bowel syndrome)   . MVP (mitral valve prolapse)    echo per  dr Dagmar Hait  . Protein S deficiency ([place]) 2003   dvt/pulmonary embolus-on coumadin for 6 months then off-Sees [facility]  . Pulmonary embolism ([place])   . PVC (premature ventricular contraction)     Past Surgical History:  Procedure Laterality Date  . CESAREAN SECTION  2006  . CHOLECYSTECTOMY N/A 11/03/2014   Procedure: LAPAROSCOPIC CHOLECYSTECTOMY WITH INTRAOPERATIVE CHOLANGIOGRAM;  Surgeon: Georganna Skeans, MD;  Location: [place];  Service: General;  Laterality: N/A;  . LAPAROSCOPIC ASSISTED VAGINAL HYSTERECTOMY  03/27/2011   Procedure: LAPAROSCOPIC ASSISTED VAGINAL HYSTERECTOMY;  Surgeon: Cyril Mourning, MD;  Location: [facility];  Service: Gynecology;  Laterality:  N/A;  . SALPINGOOPHORECTOMY  03/27/2011   Procedure: SALPINGO OOPHERECTOMY;  Surgeon: Cyril Mourning, MD;  Location: [facility];  Service: Gynecology;  Laterality: Bilateral;  . TONSILLECTOMY  92  . vaginal reconstructive surgery  2001     reports that she quit smoking about 13 years ago. Her smoking use included cigarettes. She has a 15.00 pack-year smoking history. She has never used smokeless tobacco. She reports that she does not drink alcohol or use drugs. Social History   Socioeconomic History  . Marital status: Divorced    Spouse name: Not on file  . Number of children: 2  . Years of education: Not on file  . Highest education level: Not on file  Occupational History  . Occupation: nutrition    Employer: [employer]  Social Needs  . Financial resource strain: Not hard at all  . Food insecurity:    Worry: Never true    Inability: Never true  . Transportation needs:    Medical: No    Non-medical: No  Tobacco Use  . Smoking status: Former Smoker    Packs/day: 1.00    Years: 15.00    Pack years: 15.00    Types: Cigarettes    Last attempt to quit: 03/18/2004    Years since quitting: 13.5  . Smokeless tobacco: Never Used  Substance and Sexual Activity  . Alcohol use: Never    Frequency: Never  . Drug use: No  . Sexual activity: Yes  Lifestyle  . Physical activity:    Days per week: 3 days    Minutes per session: 30 min  . Stress: To some extent  Relationships  . Social connections:    Talks on phone: More than three times a week    Gets together: More than three times a week    Attends religious service: Never    Active member of club or organization: No    Attends meetings of clubs or organizations: Never    Relationship status: Divorced  . Intimate partner violence:    Fear of current or ex partner: No    Emotionally abused: No    Physically abused: No    Forced sexual activity: No  Other Topics Concern  . Not on file  Social History Narrative    Diet: Regular   Caffeine: Yes   Married- divorced, married in 2006   House: Yes, 2 persons   Pets: 1 dog   Current/Past profession: [employer] 2002-2013   Exercise: Yes, walking   Living Will: No    DNR: No    POA/HPOA: No        Family History  Problem Relation Age of Onset  . High blood pressure Mother   . Hyperlipidemia Father        fathers side of family  . High blood pressure Father   .  Hypertension Other        entire family on both sides  . Asthma Son   . Asthma Son   . Clotting disorder Maternal Uncle   . Clotting disorder Paternal Grandmother   . Heart disease Maternal Grandfather     No Known Allergies  Outpatient Encounter Medications as of 09/17/2017  Medication Sig  . ALPRAZolam (XANAX XR) 2 MG 24 hr tablet Take 2 mg by mouth 2 (two) times daily.  Marland Kitchen ALPRAZolam (XANAX) 1 MG tablet Take 2 mg by mouth at bedtime as needed.   Marland Kitchen aspirin EC 81 MG tablet Take 81 mg by mouth daily.  . Blood Glucose Monitoring Suppl (ONE TOUCH ULTRA SYSTEM KIT) w/Device KIT 1 kit by Does not apply route once. Check blood sugar three times a week 2 hours after meals, call is BS >200 DX R73.03  . Brexpiprazole (REXULTI PO) Take 1 tablet by mouth daily.  . calcium carbonate (OS-CAL) 600 MG TABS Take 600 mg by mouth daily.  . Coenzyme Q10 (COQ10) 100 MG CAPS Take 100 mg by mouth daily.   Marland Kitchen dicyclomine (BENTYL) 20 MG tablet TAKE 1 TABLET BY MOUTH 15 TO 20 MINUTES BEFORE EATING AS NEEDED  . estradiol (ESTRACE) 0.1 MG/GM vaginal cream Place 1 Applicatorful vaginally 2 (two) times a week.  . ezetimibe (ZETIA) 10 MG tablet TAKE 1 TABLET BY MOUTH EVERY DAY  . fluconazole (DIFLUCAN) 150 MG tablet Take 150 mg by mouth. As directed  . FLUoxetine (PROZAC) 40 MG capsule Take 80 mg by mouth daily.  Marland Kitchen lamoTRIgine (LAMICTAL) 150 MG tablet Take 150 mg by mouth daily.  Marland Kitchen loperamide (IMODIUM) 2 MG capsule Take by mouth as needed for diarrhea or loose stools.  Marland Kitchen losartan (COZAAR) 100 MG tablet TAKE 1  TABLET BY MOUTH EVERY DAY  . metoprolol tartrate (LOPRESSOR) 25 MG tablet TAKE 1 TABLET BY MOUTH TWICE DAILY  . montelukast (SINGULAIR) 10 MG tablet Take 10 mg by mouth at bedtime.  . Multiple Vitamins-Minerals (MULTIVITAMIN WITH MINERALS) tablet Take 1 tablet by mouth daily.   . ondansetron (ZOFRAN) 4 MG tablet TAKE 1 TABLET(4 MG) BY MOUTH EVERY 8 HOURS AS NEEDED FOR NAUSEA OR VOMITING  . ONE TOUCH ULTRA TEST test strip CHECK BLOOD SUGAR THREE TIMES WEEKLY 2 HOURS AFTER MEALS, CALL DOCTOR IF BLOOD SUGAR GREATER THAN 200  . ONETOUCH DELICA LANCETS 82L MISC CHECK BLOOD SUGAR THREE TIMES A WEEK 2 HOURS AFTER MEALS. CALL DOCTOR IF BLOOD SUGAR GREATER THAN 200  . oxyCODONE-acetaminophen (PERCOCET) 7.5-325 MG tablet Take 1 tablet by mouth every 6 (six) hours as needed for severe pain.  . rosuvastatin (CRESTOR) 20 MG tablet Take 1 tablet (20 mg total) by mouth daily.  Marland Kitchen SYNTHROID 125 MCG tablet TAKE 1 TABLET(125 MCG) BY MOUTH DAILY BEFORE BREAKFAST  . trazodone (DESYREL) 300 MG tablet Take 300 mg by mouth at bedtime.  . valACYclovir (VALTREX) 1000 MG tablet Take 2 tabs po BID x 1 day for outbreaks as needed  . vitamin C (ASCORBIC ACID) 500 MG tablet Take 500 mg by mouth daily.  . Vitamin D, Ergocalciferol, (DRISDOL) 50000 units CAPS capsule TAKE 1 CAPSULE BY MOUTH EVERY 7 DAYS  . vitamin E 400 UNIT capsule Take 400 Units by mouth daily.  Marland Kitchen esomeprazole (NEXIUM) 40 MG capsule Take 1 capsule (40 mg total) by mouth daily. (Patient not taking: Reported on 09/15/2017)  . REXULTI 1 MG TABS 1 by mouth at bedtime   No facility-administered encounter medications on  file as of 09/17/2017.     Review of Systems:  Review of Systems  Unable to perform ROS: Psychiatric disorder    Health Maintenance  Topic Date Due  . INFLUENZA VACCINE  12/18/2017  . PAP SMEAR  02/14/2018  . TETANUS/TDAP  01/08/2024  . HIV Screening  Completed    Physical Exam: Vitals:   09/17/17 1341  BP: 122/78  Pulse: 67  Temp:  98.3 F (36.8 C)  TempSrc: Oral  SpO2: 95%  Weight: 232 lb (105.2 kg)  Height: 5' 8" (1.727 m)   Body mass index is 35.28 kg/m. Physical Exam  Constitutional: She appears well-developed and well-nourished.  HENT:  Mouth/Throat: Oropharynx is clear and moist. No oropharyngeal exudate.  Eyes: Pupils are equal, round, and reactive to light. No scleral icterus.  Neck: Neck supple. Carotid bruit is not present. No tracheal deviation present. No thyromegaly present.  Cardiovascular: Normal rate, regular rhythm, normal heart sounds and intact distal pulses. Exam reveals no gallop and no friction rub.  No murmur heard. No LE edema b/l. no calf TTP.   Pulmonary/Chest: Effort normal and breath sounds normal. No stridor. No respiratory distress. She has no wheezes. She has no rales.  Abdominal: Soft. Bowel sounds are normal. She exhibits distension. She exhibits no mass. There is no hepatomegaly. There is no tenderness. There is no rebound and no guarding.  Lymphadenopathy:    She has no cervical adenopathy.  Neurological: She is alert. She has normal reflexes.  Skin: Skin is warm and dry. No rash noted.  Psychiatric: She has a normal mood and affect. Her behavior is normal. Thought content normal. Her speech is rapid and/or pressured.    Labs reviewed: Basic Metabolic Panel: Recent Labs    02/21/17 1051 06/13/17 0846 09/15/17 0830  NA 139 137 138  K 5.0 4.4 4.6  CL 102 100 100  CO2 30 32 28  GLUCOSE 98 110* 100*  BUN _0 CREATININE 0.92 0.95 0.92  CALCIUM 9.5 9.4 10.1  TSH 0.03* 0.37* 0.27*   Liver Function Tests: Recent Labs    09/30/16 0859 02/21/17 1051 06/13/17 0846 09/15/17 0830  AST _1 ALT 19 18 62* 20  ALKPHOS 60  --   --   --   BILITOT 0.5 0.4 0.4 0.5  PROT 6.3 6.4 6.5 6.4  ALBUMIN 4.1  --   --   --    No results for input(s): LIPASE, AMYLASE in the last 8760 hours. No results for input(s): AMMONIA in the last 8760 hours. CBC: No results for  input(s): WBC, NEUTROABS, HGB, HCT, MCV, PLT in the last 8760 hours. Lipid Panel: Recent Labs    02/21/17 1051 06/13/17 0846 09/15/17 0830  CHOL 245* 264* 224*  HDL 42* 46* 39*  LDLCALC 152* 170* 133*  TRIG 329* 309* 366*  CHOLHDL 5.8* 5.7* 5.7*   Lab Results  Component Value Date   HGBA1C 5.6 06/13/2017    Procedures since last visit: No results found.  Assessment/Plan   ICD-10-CM   1. Gastroesophageal reflux disease without esophagitis K21.9 esomeprazole (NEXIUM) 40 MG capsule  2. Contact with and (suspected) exposure to infections with a predominantly sexual mode of transmission Z20.2 HSV(herpes simplex vrs) 1+2 ab-IgG    Hepatitis Acute Panel    HIV antibody (with reflex)    RPR    Hep C Antibody  3. Hypothyroidism due to acquired atrophy of thyroid E03.4   4. Hyperlipidemia LDL goal <100 E78.5  5. Essential hypertension I10   6. Class 2 obesity without serious comorbidity with body mass index (BMI) of 35.0 to 35.9 in adult, unspecified obesity type E66.9    Z68.35     PLEASE FOLLOW UP WITH DR Henrene Pastor REGARDING WORSENING ACID REFLUX  Will call with lab results  Continue current medications as ordered - resume nexium  Lab results discussed today - continue diet and increase exercise as tolerated  Follow up with mental health provider as scheduled  Follow up in 3 mos for HTN, thyroid, GERD, hyperlipidemia. Fasting labs prior to appt   [place] S. Perlie Gold  [facility] [address] [place], [place] [address] [phone] Cell (Monday-Friday 8 AM - 5 PM) [phone] After 5 PM and follow prompts

## 2017-09-17 NOTE — Patient Instructions (Addendum)
PLEASE FOLLOW UP WITH DR Henrene Pastor REGARDING WORSENING ACID REFLUX  Will call with lab results  Continue current medications as ordered - resume nexium  Lab results discussed today - continue diet and increase exercise as tolerated  Follow up with mental health provider as scheduled  Follow up in 3 mos for HTN, thyroid, GERD, hyperlipidemia. Fasting labs prior to appt

## 2017-09-18 LAB — HEPATITIS PANEL, ACUTE
Hep A IgM: NONREACTIVE
Hep B C IgM: NONREACTIVE
Hepatitis B Surface Ag: NONREACTIVE
Hepatitis C Ab: NONREACTIVE
SIGNAL TO CUT-OFF: 0.03 (ref <1.00)

## 2017-09-18 LAB — HIV ANTIBODY (ROUTINE TESTING W REFLEX): HIV 1&2 Ab, 4th Generation: NONREACTIVE

## 2017-09-18 LAB — HSV(HERPES SIMPLEX VRS) I + II AB-IGG
HAV 1 IGG,TYPE SPECIFIC AB: 47.4 index — ABNORMAL HIGH
HSV 2 IGG,TYPE SPECIFIC AB: <0.9 index

## 2017-09-18 LAB — RPR: RPR Ser Ql: NONREACTIVE

## 2017-09-26 ENCOUNTER — Other Ambulatory Visit: Payer: Self-pay | Admitting: Internal Medicine

## 2017-10-06 ENCOUNTER — Other Ambulatory Visit: Payer: Self-pay | Admitting: Internal Medicine

## 2017-11-04 DIAGNOSIS — Z79891 Long term (current) use of opiate analgesic: Secondary | ICD-10-CM | POA: Diagnosis not present

## 2017-11-04 DIAGNOSIS — M4726 Other spondylosis with radiculopathy, lumbar region: Secondary | ICD-10-CM | POA: Diagnosis not present

## 2017-11-04 DIAGNOSIS — M5416 Radiculopathy, lumbar region: Secondary | ICD-10-CM | POA: Diagnosis not present

## 2017-11-04 DIAGNOSIS — G894 Chronic pain syndrome: Secondary | ICD-10-CM | POA: Diagnosis not present

## 2017-11-06 ENCOUNTER — Other Ambulatory Visit: Payer: Self-pay | Admitting: Internal Medicine

## 2017-11-06 DIAGNOSIS — E785 Hyperlipidemia, unspecified: Secondary | ICD-10-CM

## 2017-11-30 ENCOUNTER — Other Ambulatory Visit: Payer: Self-pay | Admitting: Internal Medicine

## 2017-11-30 DIAGNOSIS — E785 Hyperlipidemia, unspecified: Secondary | ICD-10-CM

## 2017-11-30 DIAGNOSIS — E034 Atrophy of thyroid (acquired): Secondary | ICD-10-CM

## 2017-11-30 DIAGNOSIS — Z79899 Other long term (current) drug therapy: Secondary | ICD-10-CM

## 2017-12-03 DIAGNOSIS — R69 Illness, unspecified: Secondary | ICD-10-CM | POA: Diagnosis not present

## 2017-12-05 DIAGNOSIS — N760 Acute vaginitis: Secondary | ICD-10-CM | POA: Diagnosis not present

## 2017-12-05 DIAGNOSIS — R69 Illness, unspecified: Secondary | ICD-10-CM | POA: Diagnosis not present

## 2017-12-11 ENCOUNTER — Other Ambulatory Visit: Payer: Self-pay | Admitting: Internal Medicine

## 2017-12-15 ENCOUNTER — Telehealth: Payer: Self-pay | Admitting: Internal Medicine

## 2017-12-15 ENCOUNTER — Other Ambulatory Visit: Payer: Self-pay

## 2017-12-15 MED ORDER — COLESTIPOL HCL 1 G PO TABS: 2.0000 g | ORAL_TABLET | Freq: Two times a day (BID) | ORAL | 3 refills | Status: DC

## 2017-12-15 NOTE — Telephone Encounter (Signed)
Try Colestid 2 g twice a day

## 2017-12-15 NOTE — Telephone Encounter (Signed)
Pt states Andrea [name] is having bad diarrhea and it is hard for her to leave the house. Reports Andrea [name] is taking bentyl 20mg  tid and 5 Imodium a day and still having diarrhea. Offered pt an appt tomorrow with Nicoletta Ba PA but pt states Andrea [name] only sees Dr. Henrene Pastor, Andrea [name] will not see PA. Please advise.

## 2017-12-15 NOTE — Telephone Encounter (Signed)
Spoke with pt and she is aware, script sent to pharmacy. 

## 2017-12-16 ENCOUNTER — Ambulatory Visit: Payer: Medicare HMO | Admitting: Physician Assistant

## 2017-12-22 ENCOUNTER — Other Ambulatory Visit: Payer: Medicare HMO

## 2017-12-24 ENCOUNTER — Ambulatory Visit: Payer: Medicare HMO | Admitting: Internal Medicine

## 2018-01-05 DIAGNOSIS — M4726 Other spondylosis with radiculopathy, lumbar region: Secondary | ICD-10-CM | POA: Diagnosis not present

## 2018-01-05 DIAGNOSIS — M5416 Radiculopathy, lumbar region: Secondary | ICD-10-CM | POA: Diagnosis not present

## 2018-01-05 DIAGNOSIS — Z79891 Long term (current) use of opiate analgesic: Secondary | ICD-10-CM | POA: Diagnosis not present

## 2018-01-05 DIAGNOSIS — G894 Chronic pain syndrome: Secondary | ICD-10-CM | POA: Diagnosis not present

## 2018-01-07 ENCOUNTER — Encounter: Payer: Self-pay | Admitting: Internal Medicine

## 2018-01-30 ENCOUNTER — Ambulatory Visit: Payer: Medicare HMO | Admitting: Internal Medicine

## 2018-01-30 ENCOUNTER — Other Ambulatory Visit: Payer: Self-pay | Admitting: Internal Medicine

## 2018-01-30 DIAGNOSIS — E559 Vitamin D deficiency, unspecified: Secondary | ICD-10-CM

## 2018-02-10 ENCOUNTER — Other Ambulatory Visit: Payer: Self-pay | Admitting: Internal Medicine

## 2018-02-10 DIAGNOSIS — E034 Atrophy of thyroid (acquired): Secondary | ICD-10-CM

## 2018-02-15 ENCOUNTER — Other Ambulatory Visit: Payer: Self-pay | Admitting: Internal Medicine

## 2018-02-15 DIAGNOSIS — E785 Hyperlipidemia, unspecified: Secondary | ICD-10-CM

## 2018-02-15 DIAGNOSIS — Z79899 Other long term (current) drug therapy: Secondary | ICD-10-CM

## 2018-02-15 DIAGNOSIS — E034 Atrophy of thyroid (acquired): Secondary | ICD-10-CM

## 2018-03-05 ENCOUNTER — Ambulatory Visit: Payer: Medicare HMO | Admitting: Internal Medicine

## 2018-03-09 DIAGNOSIS — Z79891 Long term (current) use of opiate analgesic: Secondary | ICD-10-CM | POA: Diagnosis not present

## 2018-03-09 DIAGNOSIS — G894 Chronic pain syndrome: Secondary | ICD-10-CM | POA: Diagnosis not present

## 2018-03-09 DIAGNOSIS — M5416 Radiculopathy, lumbar region: Secondary | ICD-10-CM | POA: Diagnosis not present

## 2018-03-09 DIAGNOSIS — M4726 Other spondylosis with radiculopathy, lumbar region: Secondary | ICD-10-CM | POA: Diagnosis not present

## 2018-03-13 ENCOUNTER — Other Ambulatory Visit: Payer: Medicare HMO

## 2018-03-13 DIAGNOSIS — E034 Atrophy of thyroid (acquired): Secondary | ICD-10-CM

## 2018-03-13 DIAGNOSIS — E785 Hyperlipidemia, unspecified: Secondary | ICD-10-CM

## 2018-03-13 DIAGNOSIS — Z79899 Other long term (current) drug therapy: Secondary | ICD-10-CM | POA: Diagnosis not present

## 2018-03-13 LAB — COMPLETE METABOLIC PANEL WITH GFR
AG Ratio: 1.8 (calc) (ref 1.0–2.5)
ALT: 26 U/L (ref 6–29)
AST: 20 U/L (ref 10–30)
Albumin: 4.4 g/dL (ref 3.6–5.1)
Alkaline phosphatase (APISO): 82 U/L (ref 33–115)
BUN: 10 mg/dL (ref 7–25)
CO2: 28 mmol/L (ref 20–32)
Calcium: 9.7 mg/dL (ref 8.6–10.2)
Chloride: 102 mmol/L (ref 98–110)
Creat: 0.93 mg/dL (ref 0.50–1.10)
GFR, Est African American: 87 mL/min/{1.73_m2} (ref >=60)
GFR, Est Non African American: 75 mL/min/{1.73_m2} (ref >=60)
Globulin: 2.4 g/dL (calc) (ref 1.9–3.7)
Glucose, Bld: 104 mg/dL — ABNORMAL HIGH (ref 65–99)
Potassium: 4.4 mmol/L (ref 3.5–5.3)
Sodium: 138 mmol/L (ref 135–146)
Total Bilirubin: 0.7 mg/dL (ref 0.2–1.2)
Total Protein: 6.8 g/dL (ref 6.1–8.1)

## 2018-03-13 LAB — LIPID PANEL
Cholesterol: 278 mg/dL — ABNORMAL HIGH (ref <200)
HDL: 41 mg/dL — ABNORMAL LOW (ref >50)
LDL Cholesterol (Calc): 172 mg/dL (calc) — ABNORMAL HIGH
Non-HDL Cholesterol (Calc): 237 mg/dL (calc) — ABNORMAL HIGH (ref <130)
Total CHOL/HDL Ratio: 6.8 (calc) — ABNORMAL HIGH (ref <5.0)
Triglycerides: 386 mg/dL — ABNORMAL HIGH (ref <150)

## 2018-03-13 LAB — TSH: TSH: 0.26 mIU/L — ABNORMAL LOW

## 2018-03-14 ENCOUNTER — Other Ambulatory Visit: Payer: Self-pay | Admitting: Internal Medicine

## 2018-03-14 DIAGNOSIS — E559 Vitamin D deficiency, unspecified: Secondary | ICD-10-CM

## 2018-03-14 DIAGNOSIS — R7303 Prediabetes: Secondary | ICD-10-CM

## 2018-03-16 DIAGNOSIS — R69 Illness, unspecified: Secondary | ICD-10-CM | POA: Diagnosis not present

## 2018-03-18 ENCOUNTER — Ambulatory Visit: Payer: Medicare HMO | Admitting: Internal Medicine

## 2018-03-18 ENCOUNTER — Encounter: Payer: Self-pay | Admitting: Internal Medicine

## 2018-03-18 VITALS — BP 128/84 | HR 56 | Temp 98.3°F | Ht 68.0 in | Wt 224.0 lb

## 2018-03-18 DIAGNOSIS — Z23 Encounter for immunization: Secondary | ICD-10-CM

## 2018-03-18 DIAGNOSIS — R296 Repeated falls: Secondary | ICD-10-CM

## 2018-03-18 DIAGNOSIS — K219 Gastro-esophageal reflux disease without esophagitis: Secondary | ICD-10-CM | POA: Diagnosis not present

## 2018-03-18 DIAGNOSIS — R0781 Pleurodynia: Secondary | ICD-10-CM

## 2018-03-18 DIAGNOSIS — F429 Obsessive-compulsive disorder, unspecified: Secondary | ICD-10-CM

## 2018-03-18 DIAGNOSIS — E034 Atrophy of thyroid (acquired): Secondary | ICD-10-CM

## 2018-03-18 DIAGNOSIS — R319 Hematuria, unspecified: Secondary | ICD-10-CM

## 2018-03-18 DIAGNOSIS — K580 Irritable bowel syndrome with diarrhea: Secondary | ICD-10-CM

## 2018-03-18 DIAGNOSIS — E785 Hyperlipidemia, unspecified: Secondary | ICD-10-CM | POA: Diagnosis not present

## 2018-03-18 DIAGNOSIS — R69 Illness, unspecified: Secondary | ICD-10-CM | POA: Diagnosis not present

## 2018-03-18 NOTE — Patient Instructions (Addendum)
Will call with lab results  Flu shot given today  Continue current medications as ordered - may take crestor with percocet and tazodone  Follow up with specialists as scheduled  Follow up in 3 mos with Sherrie Mustache, NP-C for routine visit. Fasting labs prior to appt

## 2018-03-18 NOTE — Progress Notes (Signed)
Patient ID: Andrea [name], female   DOB: 02-02-1976, 42 y.o.   MRN: [id number]   Location:  [facility] OFFICE  Provider: DR Arletha Grippe  Code Status:  Goals of Care:  Advanced Directives 09/15/2017  Does Patient Have a Medical Advance Directive? No  Would patient like information on creating a medical advance directive? No - Patient declined  Pre-existing out of facility DNR order (yellow form or pink MOST form) -     Chief Complaint  Patient presents with  . Medical Management of Chronic Issues    5 month follow-ip   . Immunizations    Flu Vaccine   . Hematuria    Blood in urine, patient w/o menses (hysterectomy) x 2, onset today     HPI: Patient is a 42 y.o. female seen today for medical management of chronic diseases.  Discussed lab results. Her cholesterol is uncontrolled. She admits to forgetting to take statin most nights. She has an appt with GI Dr Henrene Pastor in 2 weeks. Unsure if colestipol is working. She avoids fatty foods. She has RUQ pain. She is c/a blood in her urine that she noticed this AM when she wiped. No vaginal d/c. She is followed by psych and was recently started on Latuda. Her SI has improved but still present. No plan. She does not cry all the time since beginning med. She still has issues with falls - she easily looses her balance. Last fall a few days resulted in LOC x several minutes. She is a poor historian due to psych d/o. Hx obtained from chart.  She is c/a wt gain. No change in lifestyle.   She is c/a STI exposure - she was using condoms but it broke x 2. Partner informed her that he has herpes and hepatitis. STI tests in jan 2019 neg. GYN would like labs retestes. No new rashes  Obesity - weight stable. ALT 20 (prev 62). She maintains healthy food choices and exercise daily.  Hypothyroidsm - stable. She takes brand synthroid 125 mcg daily. TSH 0.26; T4 free 1.6. Weight is stable  HTN - BP stable at home on lasix, losartan and lopressor. She takes ASA  daily  Hyperlipidemia - uncontrolled on zetia/crestor. LDL 172; TG 386; Tchol 278  Vit D defic - improved. Vit D 36 on supplement  Allergic rhinitis - stable on prn singulair  Prediabetes - A1c 5.4% in Jan 2018  Diarrhea/GERD/IBS - uncontrolled sx's with bloating, increased acid reflux, and chest burning and hoarseness. takes colestipol and nexium. Takes prn zofran. Followed by GI  OCD/social anxiety/MDD - mood improved on latuda, fluoxetine, lamictal, rexulti. Off wellbutrin sr. Clonazepam changed to alprazolam XR BID.  She has an autistic son. Sertraline stopped due to ADRs.  Followed by psych Dr Harlene Ramus.  In the past, she discovered that her ex husband is IVDA and shared needles with Hep C and HIV (+) individual. Last sexual contact in 2010. She had HIV test at [facility] dept that was neg. Neg Hepatitis panel. RUQ US showed fatty liver   Past Medical History:  Diagnosis Date  . Anxiety   . Arthritis    ruptured lumbar disc-careful with positioning  . Blood dyscrasia    protein s deficiency-no treatment since 2006  . Depression   . GERD (gastroesophageal reflux disease)   . Headache(784.0)   . Hemorrhoids   . High cholesterol   . Hyperlipemia   . Hypertension   . Hypothyroidism   . IBS (irritable bowel syndrome)   .  MVP (mitral valve prolapse)    echo per dr Dagmar Hait  . Protein S deficiency ([place]) 2003   dvt/pulmonary embolus-on coumadin for 6 months then off-Sees [facility]  . Pulmonary embolism ([place])   . PVC (premature ventricular contraction)     Past Surgical History:  Procedure Laterality Date  . CESAREAN SECTION  2006  . CHOLECYSTECTOMY N/A 11/03/2014   Procedure: LAPAROSCOPIC CHOLECYSTECTOMY WITH INTRAOPERATIVE CHOLANGIOGRAM;  Surgeon: Georganna Skeans, MD;  Location: [place];  Service: General;  Laterality: N/A;  . LAPAROSCOPIC ASSISTED VAGINAL HYSTERECTOMY  03/27/2011   Procedure: LAPAROSCOPIC ASSISTED VAGINAL HYSTERECTOMY;  Surgeon: Cyril Mourning, MD;  Location:  [facility];  Service: Gynecology;  Laterality: N/A;  . SALPINGOOPHORECTOMY  03/27/2011   Procedure: SALPINGO OOPHERECTOMY;  Surgeon: Cyril Mourning, MD;  Location: [facility];  Service: Gynecology;  Laterality: Bilateral;  . TONSILLECTOMY  92  . vaginal reconstructive surgery  2001     reports that she quit smoking about 14 years ago. Her smoking use included cigarettes. She has a 15.00 pack-year smoking history. She has never used smokeless tobacco. She reports that she does not drink alcohol or use drugs. Social History   Socioeconomic History  . Marital status: Divorced    Spouse name: Not on file  . Number of children: 2  . Years of education: Not on file  . Highest education level: Not on file  Occupational History  . Occupation: nutrition    Employer: [employer]  Social Needs  . Financial resource strain: Not hard at all  . Food insecurity:    Worry: Never true    Inability: Never true  . Transportation needs:    Medical: No    Non-medical: No  Tobacco Use  . Smoking status: Former Smoker    Packs/day: 1.00    Years: 15.00    Pack years: 15.00    Types: Cigarettes    Last attempt to quit: 03/18/2004    Years since quitting: 14.0  . Smokeless tobacco: Never Used  Substance and Sexual Activity  . Alcohol use: Never    Frequency: Never  . Drug use: No  . Sexual activity: Yes  Lifestyle  . Physical activity:    Days per week: 3 days    Minutes per session: 30 min  . Stress: To some extent  Relationships  . Social connections:    Talks on phone: More than three times a week    Gets together: More than three times a week    Attends religious service: Never    Active member of club or organization: No    Attends meetings of clubs or organizations: Never    Relationship status: Divorced  . Intimate partner violence:    Fear of current or ex partner: No    Emotionally abused: No    Physically abused: No    Forced sexual activity: No  Other Topics Concern   . Not on file  Social History Narrative   Diet: Regular   Caffeine: Yes   Married- divorced, married in 2006   House: Yes, 2 persons   Pets: 1 dog   Current/Past profession: [employer] 2002-2013   Exercise: Yes, walking   Living Will: No    DNR: No    POA/HPOA: No        Family History  Problem Relation Age of Onset  . High blood pressure Mother   . Hyperlipidemia Father        fathers side of family  .  High blood pressure Father   . Hypertension Other        entire family on both sides  . Asthma Son   . Asthma Son   . Clotting disorder Maternal Uncle   . Clotting disorder Paternal Grandmother   . Heart disease Maternal Grandfather     No Known Allergies  Outpatient Encounter Medications as of 03/18/2018  Medication Sig  . ALPRAZolam (XANAX XR) 2 MG 24 hr tablet Take 2 mg by mouth 2 (two) times daily.  Marland Kitchen ALPRAZolam (XANAX) 1 MG tablet Take 2 mg by mouth at bedtime as needed.   Marland Kitchen aspirin EC 81 MG tablet Take 81 mg by mouth daily.  . Blood Glucose Monitoring Suppl (ONE TOUCH ULTRA SYSTEM KIT) w/Device KIT 1 kit by Does not apply route once. Check blood sugar three times a week 2 hours after meals, call is BS >200 DX R73.03  . calcium carbonate (OS-CAL) 600 MG TABS Take 600 mg by mouth daily.  . Coenzyme Q10 (COQ10) 100 MG CAPS Take 100 mg by mouth daily.   . colestipol (COLESTID) 1 g tablet Take 2 tablets (2 g total) by mouth 2 (two) times daily.  Marland Kitchen esomeprazole (NEXIUM) 40 MG capsule Take 1 capsule (40 mg total) by mouth daily.  Marland Kitchen estradiol (ESTRACE) 0.1 MG/GM vaginal cream Place 1 Applicatorful vaginally 2 (two) times a week.  . ezetimibe (ZETIA) 10 MG tablet TAKE 1 TABLET BY MOUTH EVERY DAY  . fluconazole (DIFLUCAN) 150 MG tablet Take 150 mg by mouth as needed. As directed  . FLUoxetine (PROZAC) 40 MG capsule Take 80 mg by mouth daily.  Marland Kitchen lamoTRIgine (LAMICTAL) 150 MG tablet Take 150 mg by mouth daily.  Marland Kitchen loperamide (IMODIUM) 2 MG capsule Take by mouth as needed for  diarrhea or loose stools.  Marland Kitchen losartan (COZAAR) 100 MG tablet TAKE 1 TABLET BY MOUTH EVERY DAY  . metoprolol tartrate (LOPRESSOR) 25 MG tablet TAKE 1 TABLET BY MOUTH TWICE DAILY  . montelukast (SINGULAIR) 10 MG tablet Take 10 mg by mouth at bedtime.  . Multiple Vitamins-Minerals (MULTIVITAMIN WITH MINERALS) tablet Take 1 tablet by mouth daily.   . ondansetron (ZOFRAN) 4 MG tablet TAKE 1 TABLET(4 MG) BY MOUTH EVERY 8 HOURS AS NEEDED FOR NAUSEA OR VOMITING  . ONE TOUCH ULTRA TEST test strip CHECK BLOOD SUGAR THREE TIMES WEEKLY 2 HOURS AFTER MEALS, CALL MD IF SUGAR GREATER THAN 200  . ONETOUCH DELICA LANCETS 99B MISC CHECK BLOOD SUGAR THREE TIMES A WEEK 2 HOURS AFTER MEALS. CALL DOCTOR IF BLOOD SUGAR GREATER THAN 200  . oxyCODONE-acetaminophen (PERCOCET) 7.5-325 MG tablet Take 1 tablet by mouth every 6 (six) hours as needed for severe pain.  Marland Kitchen REXULTI 1 MG TABS 1 by mouth at bedtime  . rosuvastatin (CRESTOR) 20 MG tablet TAKE 1 TABLET(20 MG) BY MOUTH DAILY  . SYNTHROID 125 MCG tablet TAKE 1 TABLET(125 MCG) BY MOUTH DAILY BEFORE BREAKFAST  . trazodone (DESYREL) 300 MG tablet Take 300 mg by mouth at bedtime.  . valACYclovir (VALTREX) 1000 MG tablet Take 2 tabs po BID x 1 day for outbreaks as needed  . vitamin C (ASCORBIC ACID) 500 MG tablet Take 500 mg by mouth daily.  . Vitamin D, Ergocalciferol, (DRISDOL) 50000 units CAPS capsule TAKE 1 CAPSULE BY MOUTH EVERY 7 DAYS  . vitamin E 400 UNIT capsule Take 400 Units by mouth daily.  . [DISCONTINUED] Brexpiprazole (REXULTI PO) Take 1 tablet by mouth daily.  . [DISCONTINUED] dicyclomine (BENTYL) 20 MG  tablet TAKE 1 TABLET BY MOUTH 15-20 MINUTES BEFORE EATING AS NEEDED   No facility-administered encounter medications on file as of 03/18/2018.     Review of Systems:  Review of Systems  Unable to perform ROS: Psychiatric disorder    Health Maintenance  Topic Date Due  . INFLUENZA VACCINE  12/18/2017  . PAP SMEAR  02/14/2018  . TETANUS/TDAP   01/08/2024  . HIV Screening  Completed    Physical Exam: Vitals:   03/18/18 1355  BP: 128/84  Pulse: (!) 56  Temp: 98.3 F (36.8 C)  TempSrc: Oral  SpO2: 97%  Weight: 224 lb (101.6 kg)  Height: 5' 8"  (1.727 m)   Body mass index is 34.06 kg/m. Physical Exam  Constitutional: She is oriented to person, place, and time. She appears well-developed and well-nourished.  HENT:  Mouth/Throat: Oropharynx is clear and moist. No oropharyngeal exudate.  MMM; no oral thrush  Eyes: Pupils are equal, round, and reactive to light. No scleral icterus.  Neck: Neck supple. Carotid bruit is not present. No tracheal deviation present. No thyromegaly present.  Cardiovascular: Normal rate, regular rhythm, normal heart sounds and intact distal pulses. Exam reveals no gallop and no friction rub.  No murmur heard. No LE edema b/l. no calf TTP.   Pulmonary/Chest: Effort normal and breath sounds normal. No stridor. No respiratory distress. She has no wheezes. She has no rales.  Abdominal: Soft. Normal appearance and bowel sounds are normal. She exhibits no distension and no mass. There is no hepatomegaly. There is no tenderness. There is no rigidity, no rebound and no guarding. No hernia.  Musculoskeletal: She exhibits tenderness (right costal angle stuck up).  Lymphadenopathy:    She has no cervical adenopathy.  Neurological: She is alert and oriented to person, place, and time. She has normal reflexes.  Skin: Skin is warm and dry. No rash noted.  Psychiatric: Her behavior is normal. Judgment normal. Her mood appears anxious. Her speech is rapid and/or pressured. She expresses suicidal (ideations but no plan) ideation.    Labs reviewed: Basic Metabolic Panel: Recent Labs    06/13/17 0846 09/15/17 0830 03/13/18 1025  NA 137 138 138  K 4.4 4.6 4.4  CL 100 100 102  CO2 32 28 28  GLUCOSE 110* 100* 104*  BUN 17 15 10   CREATININE 0.95 0.92 0.93  CALCIUM 9.4 10.1 9.7  TSH 0.37* 0.27* 0.26*    Liver Function Tests: Recent Labs    06/13/17 0846 09/15/17 0830 03/13/18 1025  AST 28 21 20   ALT 62* 20 26  BILITOT 0.4 0.5 0.7  PROT 6.5 6.4 6.8   No results for input(s): LIPASE, AMYLASE in the last 8760 hours. No results for input(s): AMMONIA in the last 8760 hours. CBC: No results for input(s): WBC, NEUTROABS, HGB, HCT, MCV, PLT in the last 8760 hours. Lipid Panel: Recent Labs    06/13/17 0846 09/15/17 0830 03/13/18 1025  CHOL 264* 224* 278*  HDL 46* 39* 41*  LDLCALC 170* 133* 172*  TRIG 309* 366* 386*  CHOLHDL 5.7* 5.7* 6.8*   Lab Results  Component Value Date   HGBA1C 5.6 06/13/2017    Procedures since last visit: No results found.  Assessment/Plan   ICD-10-CM   1. Hematuria, unspecified type R31.9 Urinalysis with Reflex Microscopic  2. Hypothyroidism due to acquired atrophy of thyroid E03.4   3. Rib pain on right side R07.81    due to sprain/strain   4. Hyperlipidemia LDL goal <100 E78.5  5. Gastroesophageal reflux disease without esophagitis K21.9   6. Irritable bowel syndrome with diarrhea K58.0   7. Obsessive-compulsive disorder, unspecified type F42.9   8. Frequent falls R29.6   9. Need for immunization against influenza Z23 Flu Vaccine QUAD 36+ mos IM     Will call with lab results  Flu shot given today  Continue current medications as ordered - may take crestor with percocet and tazodone  Follow TSH - if drops <0.15, t/c reducing levothyroxine  May apply warm compresses prn to right costal angle  Follow up with specialists as scheduled  Follow up in 3 mos with Sherrie Mustache, NP-C for routine visit. Fasting labs prior to appt (cmp, lipid panel, tsh, t4 free)  Wyatt Thorstenson S. Perlie Gold  [facility] [address] [place], [place] [address] [phone] Cell (Monday-Friday 8 AM - 5 PM) [phone] After 5 PM and follow prompts

## 2018-03-19 ENCOUNTER — Encounter: Payer: Self-pay | Admitting: Internal Medicine

## 2018-03-19 LAB — URINALYSIS, ROUTINE W REFLEX MICROSCOPIC
Bacteria, UA: NONE SEEN /HPF
Bilirubin Urine: NEGATIVE
Glucose, UA: NEGATIVE
Hgb urine dipstick: NEGATIVE
Hyaline Cast: NONE SEEN /LPF
Ketones, ur: NEGATIVE
Nitrite: NEGATIVE
Protein, ur: NEGATIVE
RBC / HPF: NONE SEEN /HPF (ref 0–2)
Specific Gravity, Urine: 1.018 (ref 1.001–1.03)
Squamous Epithelial / LPF: NONE SEEN /HPF (ref <=5)
WBC, UA: NONE SEEN /HPF (ref 0–5)
pH: 5.5 (ref 5.0–8.0)

## 2018-03-28 DIAGNOSIS — R69 Illness, unspecified: Secondary | ICD-10-CM | POA: Diagnosis not present

## 2018-03-30 ENCOUNTER — Other Ambulatory Visit: Payer: Self-pay | Admitting: *Deleted

## 2018-03-30 DIAGNOSIS — B009 Herpesviral infection, unspecified: Secondary | ICD-10-CM

## 2018-03-30 MED ORDER — VALACYCLOVIR HCL 1 G PO TABS: ORAL_TABLET | ORAL | 3 refills | Status: DC

## 2018-03-30 NOTE — Telephone Encounter (Signed)
[facility]

## 2018-04-01 ENCOUNTER — Ambulatory Visit (INDEPENDENT_AMBULATORY_CARE_PROVIDER_SITE_OTHER): Payer: Medicare HMO | Admitting: Internal Medicine

## 2018-04-01 ENCOUNTER — Encounter: Payer: Self-pay | Admitting: Internal Medicine

## 2018-04-01 ENCOUNTER — Other Ambulatory Visit (INDEPENDENT_AMBULATORY_CARE_PROVIDER_SITE_OTHER): Payer: Medicare HMO

## 2018-04-01 VITALS — BP 122/86 | HR 56 | Ht 68.0 in | Wt 231.5 lb

## 2018-04-01 DIAGNOSIS — K589 Irritable bowel syndrome without diarrhea: Secondary | ICD-10-CM

## 2018-04-01 DIAGNOSIS — R197 Diarrhea, unspecified: Secondary | ICD-10-CM | POA: Diagnosis not present

## 2018-04-01 DIAGNOSIS — K602 Anal fissure, unspecified: Secondary | ICD-10-CM | POA: Diagnosis not present

## 2018-04-01 DIAGNOSIS — K219 Gastro-esophageal reflux disease without esophagitis: Secondary | ICD-10-CM | POA: Diagnosis not present

## 2018-04-01 LAB — IGA: IgA: 139 mg/dL (ref 68–378)

## 2018-04-01 MED ORDER — DILTIAZEM GEL 2 %: CUTANEOUS | 1 refills | Status: DC

## 2018-04-01 MED ORDER — COLESTIPOL HCL 1 G PO TABS: 2.0000 g | ORAL_TABLET | Freq: Two times a day (BID) | ORAL | 6 refills | Status: DC

## 2018-04-01 NOTE — Patient Instructions (Signed)
We have sent the following medications to your pharmacy for you to pick up at your convenience:  Colestid  We have sent the following medications to [facility] for you to pick up at your convenience:  Diltiazem gel  [address] [phone]   Use Imodium regularly, 1-2 daily

## 2018-04-01 NOTE — Progress Notes (Signed)
HISTORY OF PRESENT ILLNESS:  Andrea [name] is a pleasant 42 y.o. female with hypertension, anxiety/depression, hypothyroidism, hyperlipidemia, morbid obesity, GERD, irritable bowel syndrome with a predominance toward diarrhea, and prior cholecystectomy.  She is accompanied today by her 42 year old son.  She was last evaluated in this office July 2018 regarding diarrhea predominant IBS, functional GI complaints, GERD, obesity, fatty liver, and multiple prior non-GI somatic complaints.  At one point she was placed on Colestid 2 g twice daily.  She states this significantly has helped the consistency of her bowel habits.  More firm.  She still reports having 3-6 bowel movements per day with urgency.  Often exacerbated by meals.  Rare nocturnal symptoms.  She has gained 20 pounds since last year.  She does not use sugar-free products.  She states the Bentyl did not help her abdominal cramping.  She rarely uses Imodium, but has not states that it helps.  Finally, she reports recurrence of her anal fissure and requests that we evaluate this on physical exam today.  Review of interval blood work includes abnormal lipids April 2019.  Last hemoglobin A1c 5.6.  Last liver test in April were normal.  Ultrasound in June 2017 reveals fatty liver.  Her last EGD in August 2016 was normal.  Her last colonoscopy at that same time was normal.  She has not had biopsies for microscopic colitis or celiac testing as best I can tell.  She does take her Percocet at least 3-4 times per day  REVIEW OF SYSTEMS:  All non-GI ROS negative unless otherwise stated in the HPI except for anxiety, arthritis, back pain, depression, night sweats, headaches, sleeping problems, sore throat, excessive thirst, excessive urination, urinary frequency, urinary leakage, shortness of breath  Past Medical History:  Diagnosis Date  . Anxiety   . Arthritis    ruptured lumbar disc-careful with positioning  . Blood dyscrasia    protein s  deficiency-no treatment since 2006  . Depression   . GERD (gastroesophageal reflux disease)   . Headache(784.0)   . Hemorrhoids   . High cholesterol   . Hyperlipemia   . Hypertension   . Hypothyroidism   . IBS (irritable bowel syndrome)   . MVP (mitral valve prolapse)    echo per dr Dagmar Hait  . Protein S deficiency ([place]) 2003   dvt/pulmonary embolus-on coumadin for 6 months then off-Sees [facility]  . Pulmonary embolism ([place])   . PVC (premature ventricular contraction)     Past Surgical History:  Procedure Laterality Date  . CESAREAN SECTION  2006  . CHOLECYSTECTOMY N/A 11/03/2014   Procedure: LAPAROSCOPIC CHOLECYSTECTOMY WITH INTRAOPERATIVE CHOLANGIOGRAM;  Surgeon: Georganna Skeans, MD;  Location: [place];  Service: General;  Laterality: N/A;  . LAPAROSCOPIC ASSISTED VAGINAL HYSTERECTOMY  03/27/2011   Procedure: LAPAROSCOPIC ASSISTED VAGINAL HYSTERECTOMY;  Surgeon: Cyril Mourning, MD;  Location: [facility];  Service: Gynecology;  Laterality: N/A;  . SALPINGOOPHORECTOMY  03/27/2011   Procedure: SALPINGO OOPHERECTOMY;  Surgeon: Cyril Mourning, MD;  Location: [facility];  Service: Gynecology;  Laterality: Bilateral;  . TONSILLECTOMY  92  . vaginal reconstructive surgery  2001    Social History ITZELLE GAINS  reports that she quit smoking about 14 years ago. Her smoking use included cigarettes. She has a 15.00 pack-year smoking history. She has never used smokeless tobacco. She reports that she does not drink alcohol or use drugs.  family history includes Asthma in her son and son; Clotting disorder in her maternal uncle and paternal grandmother;  Heart disease in her maternal grandfather; High blood pressure in her father and mother; Hyperlipidemia in her father; Hypertension in her other.  No Known Allergies     PHYSICAL EXAMINATION: Vital signs: BP 122/86 (BP Location: Left Arm, Patient Position: Sitting, Cuff Size: Large)   Pulse (!) 56   Ht 5\' 8"  (1.727 m) Comment: height  measured without shoes  Wt 231 lb 8 oz (105 kg)   LMP 03/19/2011   BMI 35.20 kg/m   Constitutional: Pleasant, generally well-appearing, no acute distress Psychiatric: alert and oriented x3, cooperative.  Pressured speech Eyes: extraocular movements intact, anicteric, conjunctiva pink Mouth: oral pharynx moist, no lesions Neck: supple no lymphadenopathy Cardiovascular: heart regular rate and rhythm, no murmur Lungs: clear to auscultation bilaterally Abdomen: soft, obese, nontender, nondistended, no obvious ascites, no peritoneal signs, normal bowel sounds, no organomegaly Rectal: Anterior anal fissure which is tender and bleeding Extremities: no clubbing, cyanosis, or lower extremity edema bilaterally Skin: no lesions on visible extremities Neuro: No focal deficits.  Cranial nerves intact  ASSESSMENT:  1.  Diarrhea predominant irritable bowel syndrome.  Diarrhea worse postcholecystectomy.  Colestid helping 2.  GERD 3.  Morbid obesity 4.  Anterior anal fissure 5.  Multiple medical problems 6.  Fatty liver  PLAN:  1.  Continue Colestid 2 g twice daily.  Prescription refilled 2.  Encouraged to use Imodium more regularly.  Start with 1 dose daily.  Increase to 2 doses daily if needed.  Hold for constipation 3.  Weight loss.  I have encouraged low-carb diet.  Supplemental literature provided 4.  Screen for celiac disease with serologies today.  We will contact the patient with the results when available 5.  Prescribe 2% diltiazem gel to be applied 5 times daily as I directed her, for 6 weeks. 6.  Routine follow-up 1 year.  Could consider repeating colonoscopy with biopsies to rule out microscopic colitis if necessary 7.  Reflux precautions 8.  Continue PPI

## 2018-04-02 DIAGNOSIS — R69 Illness, unspecified: Secondary | ICD-10-CM | POA: Diagnosis not present

## 2018-04-02 LAB — TISSUE TRANSGLUTAMINASE, IGA: (tTG) Ab, IgA: 1 U/mL

## 2018-04-13 ENCOUNTER — Other Ambulatory Visit: Payer: Self-pay | Admitting: Obstetrics and Gynecology

## 2018-04-13 DIAGNOSIS — Z1231 Encounter for screening mammogram for malignant neoplasm of breast: Secondary | ICD-10-CM

## 2018-04-14 DIAGNOSIS — R69 Illness, unspecified: Secondary | ICD-10-CM | POA: Diagnosis not present

## 2018-04-17 DIAGNOSIS — R69 Illness, unspecified: Secondary | ICD-10-CM | POA: Diagnosis not present

## 2018-04-23 DIAGNOSIS — R69 Illness, unspecified: Secondary | ICD-10-CM | POA: Diagnosis not present

## 2018-04-30 DIAGNOSIS — R69 Illness, unspecified: Secondary | ICD-10-CM | POA: Diagnosis not present

## 2018-05-04 DIAGNOSIS — M5416 Radiculopathy, lumbar region: Secondary | ICD-10-CM | POA: Diagnosis not present

## 2018-05-04 DIAGNOSIS — G894 Chronic pain syndrome: Secondary | ICD-10-CM | POA: Diagnosis not present

## 2018-05-04 DIAGNOSIS — Z79891 Long term (current) use of opiate analgesic: Secondary | ICD-10-CM | POA: Diagnosis not present

## 2018-05-04 DIAGNOSIS — M4726 Other spondylosis with radiculopathy, lumbar region: Secondary | ICD-10-CM | POA: Diagnosis not present

## 2018-05-07 DIAGNOSIS — R69 Illness, unspecified: Secondary | ICD-10-CM | POA: Diagnosis not present

## 2018-05-15 DIAGNOSIS — R69 Illness, unspecified: Secondary | ICD-10-CM | POA: Diagnosis not present

## 2018-05-21 ENCOUNTER — Ambulatory Visit
Admission: RE | Admit: 2018-05-21 | Discharge: 2018-05-21 | Disposition: A | Discharge status: Home / Self Care | Payer: Medicare HMO | Source: Ambulatory Visit | Attending: Obstetrics and Gynecology | Admitting: Obstetrics and Gynecology

## 2018-05-21 DIAGNOSIS — Z1231 Encounter for screening mammogram for malignant neoplasm of breast: Secondary | ICD-10-CM

## 2018-05-22 ENCOUNTER — Other Ambulatory Visit: Payer: Self-pay | Admitting: Obstetrics and Gynecology

## 2018-05-22 DIAGNOSIS — R928 Other abnormal and inconclusive findings on diagnostic imaging of breast: Secondary | ICD-10-CM

## 2018-05-27 ENCOUNTER — Ambulatory Visit
Admission: RE | Admit: 2018-05-27 | Discharge: 2018-05-27 | Disposition: A | Discharge status: Home / Self Care | Payer: Medicare HMO | Source: Ambulatory Visit | Attending: Obstetrics and Gynecology | Admitting: Obstetrics and Gynecology

## 2018-05-27 ENCOUNTER — Other Ambulatory Visit: Payer: Self-pay | Admitting: Obstetrics and Gynecology

## 2018-05-27 DIAGNOSIS — N6489 Other specified disorders of breast: Secondary | ICD-10-CM

## 2018-05-27 DIAGNOSIS — R928 Other abnormal and inconclusive findings on diagnostic imaging of breast: Secondary | ICD-10-CM

## 2018-05-27 DIAGNOSIS — N6011 Diffuse cystic mastopathy of right breast: Secondary | ICD-10-CM | POA: Diagnosis not present

## 2018-05-27 DIAGNOSIS — N6311 Unspecified lump in the right breast, upper outer quadrant: Secondary | ICD-10-CM | POA: Diagnosis not present

## 2018-05-27 HISTORY — PX: BREAST BIOPSY: SHX20

## 2018-05-28 DIAGNOSIS — R69 Illness, unspecified: Secondary | ICD-10-CM | POA: Diagnosis not present

## 2018-06-01 ENCOUNTER — Other Ambulatory Visit: Payer: Self-pay | Admitting: *Deleted

## 2018-06-01 DIAGNOSIS — E559 Vitamin D deficiency, unspecified: Secondary | ICD-10-CM

## 2018-06-01 MED ORDER — VITAMIN D (ERGOCALCIFEROL) 1.25 MG (50000 UNIT) PO CAPS: ORAL_CAPSULE | ORAL | 0 refills | Status: DC

## 2018-06-01 MED ORDER — ROSUVASTATIN CALCIUM 20 MG PO TABS: ORAL_TABLET | ORAL | 0 refills | Status: DC

## 2018-06-01 NOTE — Telephone Encounter (Signed)
[facility].  

## 2018-06-04 DIAGNOSIS — R69 Illness, unspecified: Secondary | ICD-10-CM | POA: Diagnosis not present

## 2018-06-05 DIAGNOSIS — Z113 Encounter for screening for infections with a predominantly sexual mode of transmission: Secondary | ICD-10-CM | POA: Diagnosis not present

## 2018-06-05 DIAGNOSIS — N951 Menopausal and female climacteric states: Secondary | ICD-10-CM | POA: Diagnosis not present

## 2018-06-05 DIAGNOSIS — R69 Illness, unspecified: Secondary | ICD-10-CM | POA: Diagnosis not present

## 2018-06-05 DIAGNOSIS — N760 Acute vaginitis: Secondary | ICD-10-CM | POA: Diagnosis not present

## 2018-06-10 DIAGNOSIS — R69 Illness, unspecified: Secondary | ICD-10-CM | POA: Diagnosis not present

## 2018-06-12 ENCOUNTER — Other Ambulatory Visit: Payer: Self-pay

## 2018-06-12 DIAGNOSIS — E034 Atrophy of thyroid (acquired): Secondary | ICD-10-CM

## 2018-06-12 DIAGNOSIS — R739 Hyperglycemia, unspecified: Secondary | ICD-10-CM

## 2018-06-12 DIAGNOSIS — I10 Essential (primary) hypertension: Secondary | ICD-10-CM

## 2018-06-12 DIAGNOSIS — R69 Illness, unspecified: Secondary | ICD-10-CM | POA: Diagnosis not present

## 2018-06-12 DIAGNOSIS — E785 Hyperlipidemia, unspecified: Secondary | ICD-10-CM

## 2018-06-18 ENCOUNTER — Other Ambulatory Visit: Payer: Medicare HMO

## 2018-06-18 DIAGNOSIS — R69 Illness, unspecified: Secondary | ICD-10-CM | POA: Diagnosis not present

## 2018-06-20 ENCOUNTER — Inpatient Hospital Stay (HOSPITAL_COMMUNITY)
Admission: RE | Admit: 2018-06-20 | Discharge: 2018-07-04 | DRG: 885 | Disposition: A | Discharge status: Home / Self Care | Payer: Medicare HMO | Attending: Psychiatry | Admitting: Psychiatry

## 2018-06-20 DIAGNOSIS — K589 Irritable bowel syndrome without diarrhea: Secondary | ICD-10-CM | POA: Diagnosis present

## 2018-06-20 DIAGNOSIS — Z86711 Personal history of pulmonary embolism: Secondary | ICD-10-CM

## 2018-06-20 DIAGNOSIS — Z9079 Acquired absence of other genital organ(s): Secondary | ICD-10-CM

## 2018-06-20 DIAGNOSIS — Z79899 Other long term (current) drug therapy: Secondary | ICD-10-CM | POA: Diagnosis not present

## 2018-06-20 DIAGNOSIS — M1990 Unspecified osteoarthritis, unspecified site: Secondary | ICD-10-CM | POA: Diagnosis present

## 2018-06-20 DIAGNOSIS — Z818 Family history of other mental and behavioral disorders: Secondary | ICD-10-CM

## 2018-06-20 DIAGNOSIS — F419 Anxiety disorder, unspecified: Secondary | ICD-10-CM | POA: Diagnosis present

## 2018-06-20 DIAGNOSIS — E785 Hyperlipidemia, unspecified: Secondary | ICD-10-CM | POA: Diagnosis present

## 2018-06-20 DIAGNOSIS — F4481 Dissociative identity disorder: Secondary | ICD-10-CM | POA: Diagnosis present

## 2018-06-20 DIAGNOSIS — I341 Nonrheumatic mitral (valve) prolapse: Secondary | ICD-10-CM | POA: Diagnosis present

## 2018-06-20 DIAGNOSIS — Z90710 Acquired absence of both cervix and uterus: Secondary | ICD-10-CM

## 2018-06-20 DIAGNOSIS — R45851 Suicidal ideations: Secondary | ICD-10-CM | POA: Diagnosis not present

## 2018-06-20 DIAGNOSIS — K219 Gastro-esophageal reflux disease without esophagitis: Secondary | ICD-10-CM | POA: Diagnosis present

## 2018-06-20 DIAGNOSIS — G4700 Insomnia, unspecified: Secondary | ICD-10-CM | POA: Diagnosis not present

## 2018-06-20 DIAGNOSIS — R69 Illness, unspecified: Secondary | ICD-10-CM | POA: Diagnosis not present

## 2018-06-20 DIAGNOSIS — F430 Acute stress reaction: Secondary | ICD-10-CM | POA: Diagnosis present

## 2018-06-20 DIAGNOSIS — E034 Atrophy of thyroid (acquired): Secondary | ICD-10-CM

## 2018-06-20 DIAGNOSIS — Z87891 Personal history of nicotine dependence: Secondary | ICD-10-CM

## 2018-06-20 DIAGNOSIS — Z20828 Contact with and (suspected) exposure to other viral communicable diseases: Secondary | ICD-10-CM | POA: Diagnosis present

## 2018-06-20 DIAGNOSIS — F4310 Post-traumatic stress disorder, unspecified: Secondary | ICD-10-CM | POA: Diagnosis not present

## 2018-06-20 DIAGNOSIS — Z8349 Family history of other endocrine, nutritional and metabolic diseases: Secondary | ICD-10-CM

## 2018-06-20 DIAGNOSIS — E039 Hypothyroidism, unspecified: Secondary | ICD-10-CM | POA: Diagnosis not present

## 2018-06-20 DIAGNOSIS — I10 Essential (primary) hypertension: Secondary | ICD-10-CM | POA: Diagnosis present

## 2018-06-20 DIAGNOSIS — F332 Major depressive disorder, recurrent severe without psychotic features: Secondary | ICD-10-CM | POA: Diagnosis present

## 2018-06-20 DIAGNOSIS — F515 Nightmare disorder: Secondary | ICD-10-CM | POA: Diagnosis not present

## 2018-06-20 DIAGNOSIS — Z9049 Acquired absence of other specified parts of digestive tract: Secondary | ICD-10-CM | POA: Diagnosis not present

## 2018-06-20 DIAGNOSIS — Z79890 Hormone replacement therapy (postmenopausal): Secondary | ICD-10-CM

## 2018-06-20 DIAGNOSIS — F603 Borderline personality disorder: Secondary | ICD-10-CM | POA: Diagnosis not present

## 2018-06-20 DIAGNOSIS — Z8249 Family history of ischemic heart disease and other diseases of the circulatory system: Secondary | ICD-10-CM

## 2018-06-20 MED ORDER — ALUM & MAG HYDROXIDE-SIMETH 200-200-20 MG/5ML PO SUSP: 30.0000 mL | ORAL | Status: DC | PRN

## 2018-06-20 MED ORDER — ACETAMINOPHEN 325 MG PO TABS
650.0000 mg | ORAL_TABLET | Freq: Four times a day (QID) | ORAL | Status: DC | PRN
  Administered 2018-06-23 – 2018-06-26 (×3): 650 mg via ORAL
  Filled 2018-06-20 (×3): qty 2

## 2018-06-20 MED ORDER — ALPRAZOLAM 1 MG PO TABS
2.0000 mg | ORAL_TABLET | Freq: Every evening | ORAL | Status: DC | PRN
  Administered 2018-06-21: 2 mg via ORAL
  Filled 2018-06-20 (×3): qty 2

## 2018-06-20 MED ORDER — TRAZODONE HCL 100 MG PO TABS
100.0000 mg | ORAL_TABLET | Freq: Every evening | ORAL | Status: DC | PRN
  Administered 2018-06-20: 100 mg via ORAL
  Filled 2018-06-20 (×7): qty 1

## 2018-06-20 MED ORDER — LEVOTHYROXINE SODIUM 125 MCG PO TABS
125.0000 ug | ORAL_TABLET | Freq: Every day | ORAL | Status: DC
  Administered 2018-06-21 – 2018-07-04 (×14): 125 ug via ORAL
  Filled 2018-06-20 (×18): qty 1

## 2018-06-20 MED ORDER — MAGNESIUM HYDROXIDE 400 MG/5ML PO SUSP: 30.0000 mL | Freq: Every day | ORAL | Status: DC | PRN
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
## 2018-06-20 NOTE — Progress Notes (Signed)
With pt verbal permission, contacted Xoey Warmoth [phone] (Patient Father), to request that guns be removed from patient residence.  "I took the cartridges out of the house. Rae Roam is planning to obtain the remaining gun without the magazine. I'll go ahead and send him a text message now. My other gun is with the sheriff's department."

## 2018-06-20 NOTE — H&P (Signed)
Behavioral Health Medical Screening Exam  Andrea [name] is an 43 y.o. female.  Total Time spent with patient: 15 minutes  Psychiatric Specialty Exam: Physical Exam  Constitutional: She is oriented to person, place, and time. She appears well-developed and well-nourished. No distress.  HENT:  Head: Normocephalic and atraumatic.  Right Ear: External ear normal.  Left Ear: External ear normal.  Eyes: Pupils are equal, round, and reactive to light. Right eye exhibits no discharge. Left eye exhibits no discharge.  Respiratory: Effort normal. No respiratory distress.  Musculoskeletal: Normal range of motion.  Neurological: She is alert and oriented to person, place, and time.  Skin: She is not diaphoretic.  Psychiatric: Her mood appears anxious. She is not withdrawn and not actively hallucinating. Thought content is not paranoid. She exhibits a depressed mood. She expresses suicidal ideation. She expresses no homicidal ideation. She expresses suicidal plans.    Review of Systems  Constitutional: Positive for malaise/fatigue. Negative for chills and fever.  Respiratory: Negative for shortness of breath.   Cardiovascular: Negative for chest pain.  Gastrointestinal: Positive for nausea.  Psychiatric/Behavioral: Positive for depression and suicidal ideas. Negative for memory loss. The patient is nervous/anxious and has insomnia.   All other systems reviewed and are negative.   Last menstrual period 03/19/2011.There is no height or weight on file to calculate BMI.  General Appearance: Casual and Well Groomed  Eye Contact:  Good  Speech:  Clear and Coherent and Normal Rate  Volume:  Normal  Mood:  Anxious, Depressed, Hopeless and Worthless  Affect:  Congruent and Depressed  Thought Process:  Coherent, Goal Directed and Descriptions of Associations: Intact  Orientation:  Full (Time, Place, and Person)  Thought Content:  Logical, Hallucinations: Visual and Rumination  Suicidal Thoughts:   Yes.  with intent/plan  Homicidal Thoughts:  No  Memory:  Immediate;   Fair Recent;   Fair  Judgement:  Impaired  Insight:  Fair  Psychomotor Activity:  Normal  Concentration: Concentration: Poor and Attention Span: Poor  Recall:  Good  Fund of Knowledge:Good  Language: Good  Akathisia:  No  Handed:  Right  AIMS (if indicated):     Assets:  Communication Skills Desire for Improvement Financial Resources/Insurance Housing Leisure Time Physical Health Social Support Transportation  Sleep:       Musculoskeletal: Strength & Muscle Tone: within normal limits Gait & Station: normal   Last menstrual period 03/19/2011.  Recommendations:  Based on my evaluation the patient does not appear to have an emergency medical condition.  Rozetta Nunnery, NP 06/20/2018, 10:36 PM

## 2018-06-21 ENCOUNTER — Other Ambulatory Visit: Payer: Self-pay

## 2018-06-21 ENCOUNTER — Encounter (HOSPITAL_COMMUNITY): Payer: Self-pay

## 2018-06-21 LAB — CBC
HCT: 46.2 % — ABNORMAL HIGH (ref 36.0–46.0)
Hemoglobin: 14.6 g/dL (ref 12.0–15.0)
MCH: 29.3 pg (ref 26.0–34.0)
MCHC: 31.6 g/dL (ref 30.0–36.0)
MCV: 92.8 fL (ref 80.0–100.0)
Platelets: 300 10*3/uL (ref 150–400)
RBC: 4.98 MIL/uL (ref 3.87–5.11)
RDW: 12.7 % (ref 11.5–15.5)
WBC: 8.9 10*3/uL (ref 4.0–10.5)
nRBC: 0 % (ref 0.0–0.2)

## 2018-06-21 LAB — LIPID PANEL
Cholesterol: 258 mg/dL — ABNORMAL HIGH (ref 0–200)
HDL: 50 mg/dL (ref >40)
LDL Cholesterol: 171 mg/dL — ABNORMAL HIGH (ref 0–99)
Total CHOL/HDL Ratio: 5.2 RATIO
Triglycerides: 185 mg/dL — ABNORMAL HIGH (ref <150)
VLDL: 37 mg/dL (ref 0–40)

## 2018-06-21 LAB — COMPREHENSIVE METABOLIC PANEL
ALT: 29 U/L (ref 0–44)
AST: 24 U/L (ref 15–41)
Albumin: 4.4 g/dL (ref 3.5–5.0)
Alkaline Phosphatase: 87 U/L (ref 38–126)
Anion gap: 10 (ref 5–15)
BUN: 15 mg/dL (ref 6–20)
CO2: 28 mmol/L (ref 22–32)
Calcium: 9.9 mg/dL (ref 8.9–10.3)
Chloride: 102 mmol/L (ref 98–111)
Creatinine, Ser: 1.08 mg/dL — ABNORMAL HIGH (ref 0.44–1.00)
GFR calc Af Amer: >60 mL/min (ref >60)
GFR calc non Af Amer: >60 mL/min (ref >60)
Glucose, Bld: 106 mg/dL — ABNORMAL HIGH (ref 70–99)
Potassium: 4 mmol/L (ref 3.5–5.1)
Sodium: 140 mmol/L (ref 135–145)
Total Bilirubin: 1.1 mg/dL (ref 0.3–1.2)
Total Protein: 7.7 g/dL (ref 6.5–8.1)

## 2018-06-21 LAB — HEMOGLOBIN A1C
Hgb A1c MFr Bld: 5.6 % (ref 4.8–5.6)
Mean Plasma Glucose: 114.02 mg/dL

## 2018-06-21 LAB — TSH: TSH: 3.264 u[IU]/mL (ref 0.350–4.500)

## 2018-06-21 MED ORDER — ZIPRASIDONE MESYLATE 20 MG IM SOLR
INTRAMUSCULAR | Status: AC
  Filled 2018-06-21: qty 20

## 2018-06-21 MED ORDER — ZIPRASIDONE MESYLATE 20 MG IM SOLR
10.0000 mg | Freq: Once | INTRAMUSCULAR | Status: AC
  Administered 2018-06-21: 10 mg via INTRAMUSCULAR
  Filled 2018-06-21: qty 20

## 2018-06-21 MED ORDER — ALPRAZOLAM 1 MG PO TABS
1.0000 mg | ORAL_TABLET | Freq: Two times a day (BID) | ORAL | Status: DC | PRN
  Administered 2018-06-21 – 2018-06-25 (×4): 1 mg via ORAL
  Filled 2018-06-21 (×3): qty 1

## 2018-06-21 MED ORDER — COLESTIPOL HCL 1 G PO TABS
2.0000 g | ORAL_TABLET | Freq: Two times a day (BID) | ORAL | Status: DC
  Administered 2018-06-21 – 2018-07-04 (×25): 2 g via ORAL
  Filled 2018-06-21 (×33): qty 2
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
## 2018-06-21 NOTE — B[facility] Group Notes (Signed)
[place] Group Notes: (Clinical Social Work)   06/21/2018      Type of Therapy:  Group Therapy   Participation Level:  Did Not Attend despite MHT prompting   Selmer Dominion, LCSW 06/21/2018, 6:13 PM
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
## 2018-06-21 NOTE — Progress Notes (Signed)
Patient ID: Andrea [name], female   DOB: [birth year], 43 y.o.   MRN: [id number]    D: Pt in bed resting after getting medication for severe anxiety and agitation. Pts father did come to visit, however pt remained sleep. Pts therapist also called and made this writer aware of pts history and informed this Probation officer that she felt it was unsafe for pt to go home once discharged from [facility]. Pts therapist reported that pt may need long term placement. A: 1:1 continued for pt safety. R: Pts safety maintained.

## 2018-06-21 NOTE — Tx Team (Signed)
Initial Treatment Plan 06/21/2018 1:19 AM Jerelyn Scott Macemore LHT:[id number]    PATIENT STRESSORS: Traumatic event   PATIENT STRENGTHS: Physical Health   PATIENT IDENTIFIED PROBLEMS: "suicidal thoughts"                     DISCHARGE CRITERIA:  Ability to meet basic life and health needs Adequate post-discharge living arrangements Improved stabilization in mood, thinking, and/or behavior  PRELIMINARY DISCHARGE PLAN: Attend aftercare/continuing care group Return to previous living arrangement Return to previous work or school arrangements  PATIENT/FAMILY INVOLVEMENT: This treatment plan has been presented to and reviewed with the patient, [place] JON.  The patient and family have been given the opportunity to ask questions and make suggestions.  Gwendolyn Fill, RN 06/21/2018, 1:19 AM

## 2018-06-21 NOTE — Progress Notes (Signed)
Patient ID: Andrea [name], female   DOB: [birth year], 43 y.o.   MRN: [id number]    D: Pt very tearful on the unit today, pt requesting to have staff help her die. Pt reported that her son killed himself two days ago and that she no longer wanted to live. Pt reported that she does not believe in God and that she believes in nothing, so the best thing for her is to die. Pt reported that the world took her son and now the world needs to take her. Pt reported that staff would not be able to help her and that she was going to find a way to kill herself so that she could be with her son. Pt is positive SI, and can not contract for safety. Dr. Mallie Darting made aware of situation, he reported that he was putting in orders for a Close Obs and Xanax for anxiety. A: Close Obs started for pt safety. R: Pt remains positive SI, pt remains safety with sitter.
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
## 2018-06-21 NOTE — B[facility] Group Notes (Signed)
[place] [facility] Group Notes:  (Nursing/MHT/Case Management/Adjunct)  Date:  06/21/2018  Time:  1:57 PM  Type of Therapy:  Psychoeducational Skills  Participation Level:  Did Not Attend  Participation Quality:  Did not attend  Affect:  Did not attend  Cognitive:  Did not attend  Insight:  None  Engagement in Group:  Did not attend  Modes of Intervention:  Did not attend  Summary of Progress/Problems: Pt did not attend Psychoeducational group with topic healthy support systems.   Benancio Deeds Shanta 06/21/2018, 1:57 PM
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
## 2018-06-21 NOTE — Progress Notes (Addendum)
Patient ID: Andrea [name], female   DOB: [birth year], 43 y.o.   MRN: [id number]    Pt continued to decompensate on the unit, Dr. Mallie Darting made aware. Orders noted to give Geodon 10mg  and put patient on a official 1:1.

## 2018-06-21 NOTE — Progress Notes (Signed)
Patient ID: Andrea [name], female   DOB: [birth year], 43 y.o.   MRN: [id number]   D: Pt awake and resting in bed with her therapist and mother at the bedside. Pt continues to say that she wants to die and wants to be with her son. Pt has no insight and is not accepting of her loss. Her therapist is working with her processing situation. Pt has been encouraged to eat and drink, she continues to refuse to eat however did drink some water. Pt was told by her mother that people in the community know where pt is, and are trying to visit her. Pt requested to have her code number changed, pts code number was changed.  A: 1:1 continued for pt safety. R: Pts safety maintained.

## 2018-06-22 MED ORDER — TRAZODONE HCL 100 MG PO TABS
200.0000 mg | ORAL_TABLET | Freq: Every evening | ORAL | Status: DC | PRN
  Administered 2018-06-22: 200 mg via ORAL
  Filled 2018-06-22 (×6): qty 2

## 2018-06-22 MED ORDER — LAMOTRIGINE 150 MG PO TABS
150.0000 mg | ORAL_TABLET | Freq: Every day | ORAL | Status: DC
  Filled 2018-06-22 (×3): qty 1

## 2018-06-22 MED ORDER — DICYCLOMINE HCL 20 MG PO TABS
20.0000 mg | ORAL_TABLET | Freq: Three times a day (TID) | ORAL | Status: DC
  Administered 2018-06-23 – 2018-07-04 (×40): 20 mg via ORAL
  Filled 2018-06-22 (×59): qty 1

## 2018-06-22 MED ORDER — DOXEPIN HCL 25 MG PO CAPS: 50.0000 mg | ORAL_CAPSULE | Freq: Every evening | ORAL | Status: DC | PRN

## 2018-06-22 MED ORDER — ALPRAZOLAM ER 1 MG PO TB24
2.0000 mg | ORAL_TABLET | Freq: Every day | ORAL | Status: DC
  Administered 2018-06-22 – 2018-07-04 (×13): 2 mg via ORAL
  Filled 2018-06-22 (×12): qty 2

## 2018-06-22 MED ORDER — VITAMIN D 25 MCG (1000 UNIT) PO TABS
1000.0000 [IU] | ORAL_TABLET | Freq: Every day | ORAL | Status: DC
  Administered 2018-06-23 – 2018-07-04 (×12): 1000 [IU] via ORAL
  Filled 2018-06-22 (×17): qty 1

## 2018-06-22 MED ORDER — FLUOXETINE HCL 20 MG PO CAPS
80.0000 mg | ORAL_CAPSULE | Freq: Every day | ORAL | Status: DC
  Administered 2018-06-22 – 2018-07-03 (×12): 80 mg via ORAL
  Filled 2018-06-22 (×17): qty 4

## 2018-06-22 MED ORDER — METOPROLOL TARTRATE 25 MG PO TABS
25.0000 mg | ORAL_TABLET | Freq: Two times a day (BID) | ORAL | Status: DC
  Administered 2018-06-22 – 2018-07-04 (×23): 25 mg via ORAL
  Filled 2018-06-22 (×29): qty 1

## 2018-06-22 MED ORDER — ASENAPINE MALEATE 5 MG SL SUBL
5.0000 mg | SUBLINGUAL_TABLET | Freq: Two times a day (BID) | SUBLINGUAL | Status: DC
  Filled 2018-06-22 (×3): qty 1

## 2018-06-22 MED ORDER — PANTOPRAZOLE SODIUM 40 MG PO TBEC
40.0000 mg | DELAYED_RELEASE_TABLET | Freq: Every day | ORAL | Status: DC
  Administered 2018-06-24 – 2018-07-04 (×11): 40 mg via ORAL
  Filled 2018-06-22 (×16): qty 1

## 2018-06-22 NOTE — Progress Notes (Signed)
Patient ID: Andrea [name], female   DOB: [birth year], 43 y.o.   MRN: [id number] D) Pt resting quietly in bed with sitter at bedside. Breathing is even and unlabored. No distress noted. A) Level 1 obs supported for safety. R) safety maintained. patient is unable to contract for safety at this time.
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
## 2018-06-22 NOTE — Progress Notes (Signed)
[facility] MD Progress Note  06/22/2018 12:02 PM Andrea [name]  MRN:  [id number] Subjective: Patient is seen and examined.  Patient is a 43 year old female with a past psychiatric history significant for bipolar disorder, borderline personality disorder, dissociative identity disorder with recent stress of having found her 68 year old son deceased secondary to gunshot wound for suicide.  Objective: Patient is a 43 year old female with the above-stated past psychiatric history who is seen in follow-up.  Patient remains grief stricken today.  She is remained in bed most of the day.  She has been taking fluids, but she is not taking very much p.o. nutrition.  We discussed that.  She still remains quite tearful, sad and depressed.  She is very nihilistic.  She feels guilty about having left her son along on the day he committed suicide.  She questions what she did that would cause him to commit suicide.  She was very dependent on him in many of her life activities because of her underlying psychiatric condition.  She remains suicidal.  She required Geodon yesterday due to her emotional breakdown.  She slept several hours last night, but refused the trazodone.  She stated that she "loses time" when she takes trazodone.  She remains on Xanax, trazodone.  We reviewed her medications more thoroughly today, and reinstituted several.  She stated she was taking Latuda, but it was making her gain weight.  She had previously been treated with Rexulti and other atypical antipsychotics.  She has not been on Saphris in the past.  Her Prozac dose was approximately 80 mg today, and she also is taking Lamictal 150 mg a day.  Review of the PMP database also revealed that she has been getting Xanax ER 2 mg #60 each month, as well as Xanax short acting 1 mg 60/month.  She also receives oxycodone/acetaminophen 7.5/325 120 tablets p.o. monthly.  Principal Problem: Severe recurrent major depression without psychotic features  ([place]) Diagnosis: Principal Problem:   Severe recurrent major depression without psychotic features ([place])  Total Time spent with patient: 30 minutes  Past Psychiatric History: See admission H&P  Past Medical History:  Past Medical History:  Diagnosis Date  . Anxiety   . Arthritis    ruptured lumbar disc-careful with positioning  . Blood dyscrasia    protein s deficiency-no treatment since 2006  . Depression   . GERD (gastroesophageal reflux disease)   . Headache(784.0)   . Hemorrhoids   . High cholesterol   . Hyperlipemia   . Hypertension   . Hypothyroidism   . IBS (irritable bowel syndrome)   . MVP (mitral valve prolapse)    echo per dr Dagmar Hait  . Protein S deficiency ([place]) 2003   dvt/pulmonary embolus-on coumadin for 6 months then off-Sees [facility]  . Pulmonary embolism ([place])   . PVC (premature ventricular contraction)     Past Surgical History:  Procedure Laterality Date  . CESAREAN SECTION  2006  . CHOLECYSTECTOMY N/A 11/03/2014   Procedure: LAPAROSCOPIC CHOLECYSTECTOMY WITH INTRAOPERATIVE CHOLANGIOGRAM;  Surgeon: Georganna Skeans, MD;  Location: [place];  Service: General;  Laterality: N/A;  . LAPAROSCOPIC ASSISTED VAGINAL HYSTERECTOMY  03/27/2011   Procedure: LAPAROSCOPIC ASSISTED VAGINAL HYSTERECTOMY;  Surgeon: Cyril Mourning, MD;  Location: [facility];  Service: Gynecology;  Laterality: N/A;  . SALPINGOOPHORECTOMY  03/27/2011   Procedure: SALPINGO OOPHERECTOMY;  Surgeon: Cyril Mourning, MD;  Location: [facility];  Service: Gynecology;  Laterality: Bilateral;  . TONSILLECTOMY  92  . vaginal reconstructive surgery  2001  Family History:  Family History  Problem Relation Age of Onset  . High blood pressure Mother   . Hyperlipidemia Father        fathers side of family  . High blood pressure Father   . Hypertension Other        entire family on both sides  . Asthma Son   . Asthma Son   . Clotting disorder Maternal Uncle   . Clotting disorder Paternal  Grandmother   . Heart disease Maternal Grandfather    Family Psychiatric  History: See admission H&P Social History:  Social History   Substance and Sexual Activity  Alcohol Use Never  . Frequency: Never     Social History   Substance and Sexual Activity  Drug Use No    Social History   Socioeconomic History  . Marital status: Divorced    Spouse name: Not on file  . Number of children: 2  . Years of education: Not on file  . Highest education level: Not on file  Occupational History  . Occupation: nutrition    Employer: [employer]  Social Needs  . Financial resource strain: Not hard at all  . Food insecurity:    Worry: Never true    Inability: Never true  . Transportation needs:    Medical: No    Non-medical: No  Tobacco Use  . Smoking status: Former Smoker    Packs/day: 1.00    Years: 15.00    Pack years: 15.00    Types: Cigarettes    Last attempt to quit: 03/18/2004    Years since quitting: 14.2  . Smokeless tobacco: Never Used  Substance and Sexual Activity  . Alcohol use: Never    Frequency: Never  . Drug use: No  . Sexual activity: Yes  Lifestyle  . Physical activity:    Days per week: 3 days    Minutes per session: 30 min  . Stress: To some extent  Relationships  . Social connections:    Talks on phone: More than three times a week    Gets together: More than three times a week    Attends religious service: Never    Active member of club or organization: No    Attends meetings of clubs or organizations: Never    Relationship status: Divorced  Other Topics Concern  . Not on file  Social History Narrative   Diet: Regular   Caffeine: Yes   Married- divorced, married in 2006   House: Yes, 2 persons   Pets: 1 dog   Current/Past profession: [employer] 2002-2013   Exercise: Yes, walking   Living Will: No    DNR: No    POA/HPOA: No       Additional Social History:    Pain Medications: SEE MAR.  Prescriptions: Pt reports being  prescribed Xanax, Trazodone, Prozac and other medications she cannot recall.  Over the Counter: SEE MAR.  History of alcohol / drug use?: No history of alcohol / drug abuse                    Sleep: Good  Appetite:  Poor  Current Medications: Current Facility-Administered Medications  Medication Dose Route Frequency Provider Last Rate Last Dose  . acetaminophen (TYLENOL) tablet 650 mg  650 mg Oral Q6H PRN Lindon Romp A, NP      . ALPRAZolam Duanne Moron) tablet 1 mg  1 mg Oral BID PRN Sharma Covert, MD   1 mg at  06/21/18 0836  . ALPRAZolam Duanne Moron) tablet 2 mg  2 mg Oral QHS PRN Lindon Romp A, NP   2 mg at 06/21/18 2148  . alum & mag hydroxide-simeth (MAALOX/MYLANTA) 200-200-20 MG/5ML suspension 30 mL  30 mL Oral Q4H PRN Lindon Romp A, NP      . colestipol (COLESTID) tablet 2 g  2 g Oral BID Lindon Romp A, NP   2 g at 06/21/18 2148  . levothyroxine (SYNTHROID, LEVOTHROID) tablet 125 mcg  125 mcg Oral Q0600 Lindon Romp A, NP   125 mcg at 06/22/18 1142  . magnesium hydroxide (MILK OF MAGNESIA) suspension 30 mL  30 mL Oral Daily PRN Lindon Romp A, NP      . traZODone (DESYREL) tablet 100 mg  100 mg Oral QHS,MR X 1 Lindon Romp A, NP   100 mg at 06/20/18 2345    Lab Results:  Results for orders placed or performed during the hospital encounter of 06/20/18 (from the past 48 hour(s))  CBC     Status: Abnormal   Collection Time: 06/21/18  6:20 AM  Result Value Ref Range   WBC 8.9 4.0 - 10.5 K/uL   RBC 4.98 3.87 - 5.11 MIL/uL   Hemoglobin 14.6 12.0 - 15.0 g/dL   HCT 46.2 (H) 36.0 - 46.0 %   MCV 92.8 80.0 - 100.0 fL   MCH 29.3 26.0 - 34.0 pg   MCHC 31.6 30.0 - 36.0 g/dL   RDW 12.7 11.5 - 15.5 %   Platelets 300 150 - 400 K/uL   nRBC 0.0 0.0 - 0.2 %    Comment: Performed at [facility], [place] [address] [place], [place] [address]  Comprehensive metabolic panel     Status: Abnormal   Collection Time: 06/21/18  6:20 AM  Result Value Ref Range   Sodium 140  135 - 145 mmol/L   Potassium 4.0 3.5 - 5.1 mmol/L   Chloride 102 98 - 111 mmol/L   CO2 28 22 - 32 mmol/L   Glucose, Bld 106 (H) 70 - 99 mg/dL   BUN 15 6 - 20 mg/dL   Creatinine, Ser 1.08 (H) 0.44 - 1.00 mg/dL   Calcium 9.9 8.9 - 10.3 mg/dL   Total Protein 7.7 6.5 - 8.1 g/dL   Albumin 4.4 3.5 - 5.0 g/dL   AST 24 15 - 41 U/L   ALT 29 0 - 44 U/L   Alkaline Phosphatase 87 38 - 126 U/L   Total Bilirubin 1.1 0.3 - 1.2 mg/dL   GFR calc non Af Amer >60 >60 mL/min   GFR calc Af Amer >60 >60 mL/min   Anion gap 10 5 - 15    Comment: Performed at [facility] [place] [address], [place], [place] [address]  Hemoglobin A1c     Status: None   Collection Time: 06/21/18  6:20 AM  Result Value Ref Range   Hgb A1c MFr Bld 5.6 4.8 - 5.6 %    Comment: (NOTE) Pre diabetes:          5.7%-6.4% Diabetes:              >6.4% Glycemic control for   <7.0% adults with diabetes    Mean Plasma Glucose 114.02 mg/dL    Comment: Performed at [facility] [address], [place], [place] [address]  Lipid panel     Status: Abnormal   Collection Time: 06/21/18  6:20 AM  Result Value Ref Range  Cholesterol 258 (H) 0 - 200 mg/dL   Triglycerides 185 (H) <150 mg/dL   HDL 50 >40 mg/dL   Total CHOL/HDL Ratio 5.2 RATIO   VLDL 37 0 - 40 mg/dL   LDL Cholesterol 171 (H) 0 - 99 mg/dL    Comment:        Total Cholesterol/HDL:CHD Risk Coronary Heart Disease Risk Table                     Men   Women  1/2 Average Risk   3.4   3.3  Average Risk       5.0   4.4  2 X Average Risk   9.6   7.1  3 X Average Risk  23.4   11.0        Use the calculated Patient Ratio above and the CHD Risk Table to determine the patient's CHD Risk.        ATP III CLASSIFICATION (LDL):  <100     mg/dL   Optimal  100-129  mg/dL   Near or Above                    Optimal  130-159  mg/dL   Borderline  160-189  mg/dL   High  >190     mg/dL   Very High Performed at [facility] [address] [place], [place] [address]   TSH     Status: None   Collection Time: 06/21/18  6:20 AM  Result Value Ref Range   TSH 3.264 0.350 - 4.500 uIU/mL    Comment: Performed by a 3rd Generation assay with a functional sensitivity of <=0.01 uIU/mL. Performed at [facility], [place] [address] [place], [place] [address]     Blood Alcohol level:  No results found for: [facility]  Metabolic Disorder Labs: Lab Results  Component Value Date   HGBA1C 5.6 06/21/2018   MPG 114.02 06/21/2018   MPG 114 06/13/2017   No results found for: PROLACTIN Lab Results  Component Value Date   CHOL 258 (H) 06/21/2018   TRIG 185 (H) 06/21/2018   HDL 50 06/21/2018   CHOLHDL 5.2 06/21/2018   VLDL 37 06/21/2018   LDLCALC 171 (H) 06/21/2018   LDLCALC 172 (H) 03/13/2018    Physical Findings: AIMS:  , ,  ,  ,    CIWA:    COWS:     Musculoskeletal: Strength & Muscle Tone: decreased Gait & Station: unsteady Patient leans: N/A  Psychiatric Specialty Exam: Physical Exam  Nursing note and vitals reviewed. Constitutional: She is oriented to person, place, and time. She appears well-developed and well-nourished.  HENT:  Head: Normocephalic and atraumatic.  Respiratory: Effort normal.  Neurological: She is alert and oriented to person, place, and time.    ROS  Blood pressure 124/88, pulse 89, temperature 98.2 F (36.8 C), temperature source Oral, resp. rate 18, height 5\' 9"  (1.753 m), weight 104.3 kg, last menstrual period 03/19/2011, SpO2 100 %.Body mass index is 33.97 kg/m.  General Appearance: Disheveled  Eye Contact:  Fair  Speech:  Normal Rate  Volume:  Decreased  Mood:  Depressed  Affect:  Congruent  Thought Process:  Coherent and Descriptions of Associations: Intact  Orientation:  Full (Time, Place, and Person)  Thought Content:  Logical  Suicidal Thoughts:  Yes.  with intent/plan  Homicidal Thoughts:  No  Memory:  Immediate;   Fair Recent;   Fair Remote;   Fair  Judgement:   Impaired  Insight:  Fair  Psychomotor Activity:  Psychomotor Retardation  Concentration:  Concentration: Fair and Attention Span: Fair  Recall:  [employer] of Knowledge:  Fair  Language:  Good  Akathisia:  Negative  Handed:  Right  AIMS (if indicated):     Assets:  Communication Skills Desire for Improvement Housing Physical Health  ADL's:  Intact  Cognition:  WNL  Sleep:  Number of Hours: 5.25     Treatment Plan Summary: Daily contact with patient to assess and evaluate symptoms and progress in treatment, Medication management and Plan Patient is seen and examined.  Patient is a 43 year old female with the above-stated past psychiatric history seen in follow-up.  She remains significantly distraught and upset.  I was able to review her medications more thoroughly today, and we are going to reinstitute many of those.  We will restart her fluoxetine at 80 mg p.o. daily, and increase the trazodone to 200 mg p.o. nightly.  I would consider switching to doxepin, but she had been receiving 300 mg trazodone for sleep.  She also was taking Lamictal 150 mg p.o. daily.  This will be restarted.  She also stated she was taking Latuda, but she was going to stop it on her next visit with Dr.Kaur because she was gaining weight with it.  She also had previously been treated with Rexulti.  I am going to start Saphris 5 mg sublingual twice daily for some mood stability.  I am also going to start back her vitamin D as well as the Xanax at her previous dosage to prevent any withdrawal syndrome.  She also was taking a proton pump inhibitor as well as Bentyl for her irritable bowel problems, and those will be restarted.  Her levothyroxine was continued at 125 mcg p.o. daily.  She has seen the pastoral staff for grief counseling, and her mother, father and therapist all visited last night.  Her therapist on admission recommended "long-term treatment".  Not exactly sure what that really indicates.  I have discussed  this with social work, and they will Dietitian in [place] and inquire about long-term treatment for her grief as well as her underlying psychiatric illness.  She remains on one-to-one for safety as well as falls.  I have discussed oral intake as well as fluid intake, and warned her that if she continues not to eat and drink very well it may require [place] to send her to the emergency room, and she prefers to avoid that. 1.  We will write for her home Xanax dose as Xanax XR 2 mg p.o. twice daily as well as short acting Xanax 1 mg p.o. twice daily in order to avoid withdrawal syndrome at this point. 2.  Stop Latuda which she had been taking at home. 3.  Saphris 5 mg sublingual twice daily for mood stability. 4.  Continue vitamin D 3000 units p.o. daily for nutritional supplementation. 5.  Continue colestipol 2 g p.o. daily for gastric issues. 6.  Continue Bentyl 20 mg p.o. 3 times daily before meals and at bedtime for irritable bowel syndrome. 7.  Restart fluoxetine 80 mg p.o. daily for anxiety and mood. 8.  Restart Lamictal 150 mg p.o. daily for mood stability. 9.  Continue levothyroxine 125 mcg p.o. daily for hypothyroidism. 10.  Restart metoprolol short acting 25 mg p.o. twice daily for heart rate, anxiety and blood pressure. 11.  Continue to hold losartan. 12.  Protonix 40 mg p.o. daily for gastric  protection. 13.  Increase trazodone to 200 mg p.o. nightly for sleep 14.  Continue grief therapy. 102.  Social work inquiring about possible options for long-term treatment. 16.  Disposition planning-in progress.    Sharma Covert, MD 06/22/2018, 12:02 PM

## 2018-06-22 NOTE — [facility] Counselor (Signed)
CSW attempted to see the patient to complete an assessment. The patient was asleep. Patient is currently on a 1:1. CSW will continue to follow and will attempt to complete PSA at a later time.   Radonna Ricker, MSW, [place] Worker [facility]  Phone: [phone]

## 2018-06-22 NOTE — Progress Notes (Signed)
Recreation Therapy Notes  Date:  2.3.20 Time: 0930 Location: [address] Dayroom  Group Topic: Stress Management  Goal Area(s) Addresses:  Patient will identify positive stress management techniques. Patient will identify benefits of using stress management post d/c.  Intervention:  Stress Management  Activity :  Guided Imagery.  LRT introduced the stress management technique of guided imagery.  LRT read a script that took patients on a journey to the beach at sunrise.  Patients were to listen and follow along as script was read by LRT to engage in activity.  Education:  Stress Management, Discharge Planning.   Education Outcome: Acknowledges Education  Clinical Observations/Feedback: Pt did not attend group.      Victorino Sparrow, LRT/CTRS         Victorino Sparrow A 06/22/2018 10:57 AM

## 2018-06-22 NOTE — Progress Notes (Signed)
Checked on patient briefly as this Probation officer was needed for an admission off the unit. Patient denied any needs at that time. Remains safe on level I obs. Patient is safe.

## 2018-06-22 NOTE — Progress Notes (Signed)
Patient observed resting on R side. RR WNL, even and unlabored. NAD. Level I obs remains in place for safety and patient is safe.

## 2018-06-22 NOTE — Progress Notes (Signed)
Patient observed resting in bed though is not asleep. Refused trazadone at bed time stating, "it makes me lose time. I don't like taking it, and I rarely ever do." Patient continues to express SI stating, "I looked around the room for things I could hurt myself with, but you all have it pretty locked down." Patient said this and looked at writer for reaction but none was given. Patient was also insistent on taking a shower though reported feeling light headed "because I haven't eaten since Thursday. It's not the xanax." Instructed patient to remain in bed, offered Ensure which was refused, fluids refilled which patient is drinking. Level I obs remains in place for safety, patient safety at this time.

## 2018-06-22 NOTE — Progress Notes (Signed)
Patient ID: Andrea [name], female   DOB: 1975-07-29, 43 y.o.   MRN: [id number] D) Pt resting quietly in bed with sitter at bedside. Breathing is even and unlabored. No distress noted. A) Level 1 obs supported for safety. R) safety maintained.

## 2018-06-22 NOTE — BHH Group Notes (Signed)
LCSW Group Therapy Note 06/22/2018 12:46 PM  Type of Therapy and Topic: Group Therapy: Overcoming Obstacles  Participation Level: Did Not Attend  Description of Group:  In this group patients will be encouraged to explore what they see as obstacles to their own wellness and recovery. They will be guided to discuss their thoughts, feelings, and behaviors related to these obstacles. The group will process together ways to cope with barriers, with attention given to specific choices patients can make. Each patient will be challenged to identify changes they are motivated to make in order to overcome their obstacles. This group will be process-oriented, with patients participating in exploration of their own experiences as well as giving and receiving support and challenge from other group members.  Therapeutic Goals: 1. Patient will identify personal and current obstacles as they relate to admission. 2. Patient will identify barriers that currently interfere with their wellness or overcoming obstacles.  3. Patient will identify feelings, thought process and behaviors related to these barriers. 4. Patient will identify two changes they are willing to make to overcome these obstacles:   Summary of Patient Progress  Invited, chose not to attend.     Therapeutic Modalities:  Cognitive Behavioral Therapy Solution Focused Therapy Motivational Interviewing Relapse Prevention Therapy   Theresa Duty Clinical Social Worker

## 2018-06-22 NOTE — Progress Notes (Signed)
1:1 Note: Pt is seen lying in bed tearing talking about the death of her son. Pt stated she can not understand why he did and yet they were so close. Pt stated she does not have a reason to live and want to end her life so she can go find her. Pt encouraged to take care of herself by eating and drinking fluids. Pt also stated she feels weak and dizzy when walking, wheel chair ordered for safe ambulation. Sitter by the bed side, will continue to monitor.

## 2018-06-22 NOTE — Tx Team (Signed)
Interdisciplinary Treatment and Diagnostic Plan Update  06/22/2018 Time of Session:  Andrea [name] MRN: [id number]  Principal Diagnosis: Severe recurrent major depression without psychotic features [facility])  Secondary Diagnoses: Principal Problem:   Severe recurrent major depression without psychotic features ([place])   Current Medications:  Current Facility-Administered Medications  Medication Dose Route Frequency Provider Last Rate Last Dose  . acetaminophen (TYLENOL) tablet 650 mg  650 mg Oral Q6H PRN Lindon Romp A, NP      . ALPRAZolam Duanne Moron) tablet 1 mg  1 mg Oral BID PRN Sharma Covert, MD   1 mg at 06/21/18 0836  . ALPRAZolam Duanne Moron) tablet 2 mg  2 mg Oral QHS PRN Lindon Romp A, NP   2 mg at 06/21/18 2148  . alum & mag hydroxide-simeth (MAALOX/MYLANTA) 200-200-20 MG/5ML suspension 30 mL  30 mL Oral Q4H PRN Lindon Romp A, NP      . colestipol (COLESTID) tablet 2 g  2 g Oral BID Lindon Romp A, NP   2 g at 06/21/18 2148  . levothyroxine (SYNTHROID, LEVOTHROID) tablet 125 mcg  125 mcg Oral Q0600 Lindon Romp A, NP   125 mcg at 06/21/18 9476  . magnesium hydroxide (MILK OF MAGNESIA) suspension 30 mL  30 mL Oral Daily PRN Lindon Romp A, NP      . traZODone (DESYREL) tablet 100 mg  100 mg Oral QHS,MR X 1 Lindon Romp A, NP   100 mg at 06/20/18 2345   PTA Medications: Medications Prior to Admission  Medication Sig Dispense Refill Last Dose  . ALPRAZolam (XANAX XR) 2 MG 24 hr tablet Take 1 tablet by mouth 2 (two) times daily.     Marland Kitchen ALPRAZolam (XANAX) 1 MG tablet Take 1 tablet by mouth 2 (two) times daily as needed.     . colestipol (COLESTID) 1 g tablet Take 2 tablets (2 g total) by mouth 2 (two) times daily. 120 tablet 6   . esomeprazole (NEXIUM) 40 MG capsule Take 1 capsule by mouth daily.     Marland Kitchen ezetimibe (ZETIA) 10 MG tablet TAKE 1 TABLET BY MOUTH EVERY DAY 90 tablet 1 Taking  . FLUoxetine (PROZAC) 40 MG capsule Take 80 mg by mouth daily.   Taking  . lamoTRIgine (LAMICTAL)  150 MG tablet Take 150 mg by mouth daily.   Taking  . losartan (COZAAR) 100 MG tablet TAKE 1 TABLET BY MOUTH EVERY DAY 90 tablet 1 Taking  . lurasidone (LATUDA) 20 MG TABS tablet Take 20 mg by mouth daily.   Taking  . metoprolol tartrate (LOPRESSOR) 25 MG tablet TAKE 1 TABLET BY MOUTH TWICE DAILY 180 tablet 1 Taking  . metroNIDAZOLE (FLAGYL) 500 MG tablet Take 1 tablet by mouth 2 (two) times daily. X 7 days ordered 06/20/18     . oxyCODONE-acetaminophen (PERCOCET) 7.5-325 MG tablet Take 1 tablet by mouth every 6 (six) hours as needed for severe pain.   Taking  . rosuvastatin (CRESTOR) 20 MG tablet Take one tablet by mouth once daily 90 tablet 0   . SYNTHROID 125 MCG tablet TAKE 1 TABLET(125 MCG) BY MOUTH DAILY BEFORE BREAKFAST 30 tablet 6 Taking  . trazodone (DESYREL) 300 MG tablet Take 300 mg by mouth at bedtime.   Taking  . valACYclovir (VALTREX) 1000 MG tablet Take 2 tabs po BID x 1 day for outbreaks as needed 40 tablet 3 Taking  . Vitamin D, Ergocalciferol, (DRISDOL) 1.25 MG (50000 UT) CAPS capsule Take 1 capsule by mouth every 7 (seven) days.  Patient Stressors: Traumatic event  Patient Strengths: Physical Health  Treatment Modalities: Medication Management, Group therapy, Case management,  1 to 1 session with clinician, Psychoeducation, Recreational therapy.   Physician Treatment Plan for Primary Diagnosis: Severe recurrent major depression without psychotic features ([place]) Long Term Goal(s): Improvement in symptoms so as ready for discharge Improvement in symptoms so as ready for discharge   Short Term Goals: Ability to disclose and discuss suicidal ideas Ability to demonstrate self-control will improve Ability to identify and develop effective coping behaviors will improve Ability to maintain clinical measurements within normal limits will improve Compliance with prescribed medications will improve Ability to identify triggers associated with substance abuse/mental health  issues will improve  Medication Management: Evaluate patient's response, side effects, and tolerance of medication regimen.  Therapeutic Interventions: 1 to 1 sessions, Unit Group sessions and Medication administration.  Evaluation of Outcomes: Not Met  Physician Treatment Plan for Secondary Diagnosis: Principal Problem:   Severe recurrent major depression without psychotic features ([place])  Long Term Goal(s): Improvement in symptoms so as ready for discharge Improvement in symptoms so as ready for discharge   Short Term Goals: Ability to disclose and discuss suicidal ideas Ability to demonstrate self-control will improve Ability to identify and develop effective coping behaviors will improve Ability to maintain clinical measurements within normal limits will improve Compliance with prescribed medications will improve Ability to identify triggers associated with substance abuse/mental health issues will improve     Medication Management: Evaluate patient's response, side effects, and tolerance of medication regimen.  Therapeutic Interventions: 1 to 1 sessions, Unit Group sessions and Medication administration.  Evaluation of Outcomes: Not Met   RN Treatment Plan for Primary Diagnosis: Severe recurrent major depression without psychotic features ([place]) Long Term Goal(s): Knowledge of disease and therapeutic regimen to maintain health will improve  Short Term Goals: Ability to verbalize frustration and anger appropriately will improve, Ability to participate in decision making will improve, Ability to verbalize feelings will improve, Ability to disclose and discuss suicidal ideas and Ability to identify and develop effective coping behaviors will improve  Medication Management: RN will administer medications as ordered by provider, will assess and evaluate patient's response and provide education to patient for prescribed medication. RN will report any adverse and/or side effects to  prescribing provider.  Therapeutic Interventions: 1 on 1 counseling sessions, Psychoeducation, Medication administration, Evaluate responses to treatment, Monitor vital signs and CBGs as ordered, Perform/monitor CIWA, COWS, AIMS and Fall Risk screenings as ordered, Perform wound care treatments as ordered.  Evaluation of Outcomes: Not Met   LCSW Treatment Plan for Primary Diagnosis: Severe recurrent major depression without psychotic features ([place]) Long Term Goal(s): Safe transition to appropriate next level of care at discharge, Engage patient in therapeutic group addressing interpersonal concerns.  Short Term Goals: Engage patient in aftercare planning with referrals and resources, Increase social support and Increase skills for wellness and recovery  Therapeutic Interventions: Assess for all discharge needs, 1 to 1 time with Social worker, Explore available resources and support systems, Assess for adequacy in community support network, Educate family and significant other(s) on suicide prevention, Complete Psychosocial Assessment, Interpersonal group therapy.  Evaluation of Outcomes: Not Met   Progress in Treatment: Attending groups: No. Participating in groups: No. Taking medication as prescribed: Yes. Toleration medication: Yes. Family/Significant other contact made: No, will contact:  patient's mother or father  Patient understands diagnosis: Yes. Discussing patient identified problems/goals with staff: Yes. Medical problems stabilized or resolved: No. Denies suicidal/homicidal ideation: No.  Patient currently on 1:1 Issues/concerns per patient self-inventory: No Other:   New problem(s) identified: None   New Short Term/Long Term Goal(s): medication stabilization, elimination of SI thoughts, development of comprehensive mental wellness plan.   Patient Goals:    Discharge Plan or Barriers: Patient lives with he father currently. Patient currently follows up with Dr. Toy Care for  medication management and Debria Garret for therapy services. The patient's medication management provider and therapist are recommending long term treatment for the patient at discharge. CSW will continue to follow and assess for appropriate referrals and possible discharge planning.   Reason for Continuation of Hospitalization: Anxiety Depression Medication stabilization Suicidal ideation  Estimated Length of Stay: 06/26/2018  Attendees: Patient: 06/22/2018 10:53 AM  Physician: Dr. Myles Lipps, MD 06/22/2018 10:53 AM  Nursing: Opal Sidles.Jenetta Downer RN 06/22/2018 10:53 AM  RN Care Manager: 06/22/2018 10:53 AM  Social Worker: Radonna Ricker, [place] 06/22/2018 10:53 AM  Recreational Therapist:  06/22/2018 10:53 AM  Other: Harriett Sine, NP  06/22/2018 10:53 AM  Other:  06/22/2018 10:53 AM  Other: 06/22/2018 10:53 AM    Scribe for Treatment Team: Marylee Floras, [place] 06/22/2018 10:53 AM

## 2018-06-23 ENCOUNTER — Ambulatory Visit: Payer: Medicare HMO | Admitting: Nurse Practitioner

## 2018-06-23 DIAGNOSIS — F332 Major depressive disorder, recurrent severe without psychotic features: Principal | ICD-10-CM

## 2018-06-23 MED ORDER — TRAZODONE HCL 100 MG PO TABS
100.0000 mg | ORAL_TABLET | Freq: Every evening | ORAL | Status: DC | PRN
  Administered 2018-06-23 – 2018-06-24 (×2): 100 mg via ORAL
  Filled 2018-06-23 (×2): qty 1

## 2018-06-23 NOTE — Progress Notes (Signed)
Patient attended grief and loss group facilitated by Jerene Pitch, [place], [place], and Kerry Hough, [place], [facility], [place].   Group focuses on change and loss experienced by group members; topics include loss due to death, loss of relationships, loss of a place, loss of sense of self, etc. Group members are invited to share the changes and losses that are currently affecting their lives. Facilitators tie together shared experiences between group members and validate emotions felt by participants.  Patient Andrea [name] attended group. Pt appeared sad and tearful and was comforted by another group member. Pt displayed inward body language. Pt was called out of group by psychiatrist and chose to return following time with psychiatrist.

## 2018-06-23 NOTE — Progress Notes (Signed)
Patient stated that she was proud of the fact that she left the "denial stage" of her child's passing and is now accepting it. Her goal for tomorrow is to no longer have visual hallucinations.

## 2018-06-23 NOTE — Progress Notes (Signed)
Psychoeducational Group Note  Date:  06/23/2018 Time:  0105  Group Topic/Focus:  Wrap-Up Group:   The focus of this group is to help patients review their daily goal of treatment and discuss progress on daily workbooks.  Participation Level: Did Not Attend  Participation Quality:  Not Applicable  Affect:  Not Applicable  Cognitive:  Not Applicable  Insight:  Not Applicable  Engagement in Group: Not Applicable  Additional Comments:  The patient did not attend group last evening and remained in her bedroom.   Stephnie Parlier S 06/23/2018, 1:05 AM

## 2018-06-23 NOTE — Progress Notes (Signed)
Pt was lying in bed, awake and alert when I arrived. She thanked me several times for coming back. She said today had been difficult but she took a shower and ate a little. We celebrated the milestones of showering and eating and acknowledged that it was easy but she did it. She said she wants to know why. She said her dad is supposed to be picking up letters her son left from the [place] office and she wants to see them. She said she still can't look at his pictures. She said she wonders why her son thought it was okay for her to find him that way. She wants to know why he did it in such a violent way. She said she knows he's gone but she can't accept it yet. She said she doesn't know why he did it since he was so happy; she said she gave him everything. She said she made sure he was happy; she said they laughed a lot together. She said they laughed at the dog, things on tv or just laughed. She said she wants to understand why he did it. She again spoke of how she tried to give him CPR but was told he had been gone for a while. She said he had to have thought it out, but why. Once again she talked about his father's family and how they hated that he was gay but she accepted it. Pt said maybe she should have taken him with her the day she left him to go to her counseling session. She said he knew how long she would be gone. We talked about the fact that, as she said, he had thought it out. In response to some of her thoughts, we discussed the fact that this was not her fault and that she could not have seen this coming. She shook her head in agreement, but still seemed baffled at the thought. Pt asked me, 3 times, to come back. Please call if a visit is needed before I return. [place], [place]   06/22/18 1730  Clinical Encounter Type  Visited With Patient

## 2018-06-23 NOTE — BHH Suicide Risk Assessment (Signed)
[place] INPATIENT:  Family/Significant Other Suicide Prevention Education  Suicide Prevention Education:  Education Completed; with father, Andrea [name] [phone]) has been identified by the patient as the family member/significant other with whom the patient will be residing, and identified as the person(s) who will aid the patient in the event of a mental health crisis (suicidal ideations/suicide attempt).  With written consent from the patient, the family member/significant other has been provided the following suicide prevention education, prior to the and/or following the discharge of the patient.  The suicide prevention education provided includes the following:  Suicide risk factors  Suicide prevention and interventions  National Suicide Hotline telephone number  [facility] assessment telephone number  [facility] Emergency Assistance [place] and/or Residential Mobile Crisis Unit telephone number  Request made of family/significant other to:  Remove weapons (e.g., guns, rifles, knives), all items previously/currently identified as safety concern.    Remove drugs/medications (over-the-counter, prescriptions, illicit drugs), all items previously/currently identified as a safety concern.  The family member/significant other verbalizes understanding of the suicide prevention education information provided.  The family member/significant other agrees to remove the items of safety concern listed above.  Patient's father mentioned that [facility] department is requesting to speak with the patient's attending physician to determine whether or not it is appropriate for the patient to read her 43 year old's suicide letter. CSW ensured the patient's father that she would share this information with the attending provider.   The patient's father also mentioned that he is concerned with the patient discharging from the hospital "in a few days" due to him  not feeling like she will be better in a short term stay. CSW mentioned that the patient's OP providers and attending provider are currently recommending long term treatment, however the patient is currently declining at this time. CSW will continue to follow.      Andrea [name] 06/23/2018, 11:03 AM

## 2018-06-23 NOTE — Progress Notes (Signed)
D: Patient observed resting in bed. Patient states, "today was some better. I took a shower and ate a little, not a lot but some. I think I realize now that he's really gone. I don't want to die and go find him anymore. I want to know why he did it though and I'm hoping the sheriff will release the notes and that will give me answers." "I do think I'll try to make it to a group or 2 tomorrow." Patient's affect flat/blank, mood depressed and ambivalent.  Denies pain, physical complaints.   A: Medicated per orders, no prns needed as patient feels trazadone will be most beneficial. Medication education provided. Level I obs in place for safety. Emotional support offered. Patient encouraged to complete Suicide Safety Plan before discharge. Encouraged to attend and participate in unit programming.  Fall prevention plan in place and reviewed with patient as pt is a high fall risk.   R: Patient verbalizes understanding of POC, falls prevention education. Patient denies SI at this time though acknowledges some SI earlier today. Verbal contract in place. No HI/AVH and remains safe on level I obs. Will continue to monitor throughout the night.
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
## 2018-06-23 NOTE — Progress Notes (Signed)
1400 Observation note: Pt observed sitting in the consult room speaking with Dr. Dwyane Dee and medical students. Pt noted to be cooperative at this time. Pt remains on 1:1 observation for safety.

## 2018-06-23 NOTE — Progress Notes (Signed)
Recreation Therapy Notes  Animal-Assisted Activity (AAA) Program Checklist/Progress Notes Patient Eligibility Criteria Checklist & Daily Group note for Rec Tx Intervention  Date: 2.4.20 Time: 1430 Location: [address]   AAA/T Program Assumption of Risk Form signed by Teacher, music or Parent Legal Guardian  YES   Patient is free of allergies or sever asthma   YES   Patient reports no fear of animals  YES   Patient reports no history of cruelty to animals  YES   Patient understands his/her participation is voluntary  YES   Patient washes hands before animal contact  YES   Patient washes hands after animal contact  YES   Education: Contractor, Appropriate Animal Interaction   Education Outcome: Acknowledges understanding/In group clarification offered/Needs additional education.   Clinical Observations/Feedback: Pt did not attend group.    Victorino Sparrow, LRT/CTRS         Victorino Sparrow A 06/23/2018 3:48 PM

## 2018-06-23 NOTE — Progress Notes (Signed)
Patient observed resting in bed with eyes closed. RR WNL, even and unlabored. NAD. Level I obs in place for safety and patient is safe.

## 2018-06-23 NOTE — Progress Notes (Signed)
1:1 observation note: Pt presented to the medication window with sitter. Pt observed sitting in a wheelchair. Pt reported using the wheelchair due to recent fainting episodes. Pt presented with a flat affect and a depressed mood. Pt expressed that she was having thoughts of suicide in a sense that she wanted to go and be with her son. Pt verbalized that she's accepted that he's gone. Pt continues to report ongoing depression and anxiety.  Pt remains on 1:1 observation for safety.

## 2018-06-23 NOTE — Progress Notes (Signed)
1800 observation note:  Pt was observed sitting in her room speaking with sitter. No concerns verbalized by pt at this time. Pt denies active SI. Pt remains on 1:1 observation for safety.

## 2018-06-23 NOTE — Progress Notes (Signed)
Pt was sitting up in bed when I arrived. Her mother and surviving son were bedside. She said she had a better day today. She said she ate a meal. She also said she went to group but wasn't ready for that. She said she is not in denial about her [name] (her son) but she has not accepted it. She said she has to see his letter before she can move on. (Her dad was checking on getting the letter her son left.) Our visit was brief since it was family time. However, she was very Patent attorney. Please page/call if additional assistance is needed. [place], [place]   06/23/18 1900  Clinical Encounter Type  Visited With Patient and family together

## 2018-06-23 NOTE — Progress Notes (Signed)
[name] MD Progress Note  06/23/2018 1:01 PM Andrea [name]  MRN:  [id number] Subjective: Patient reports feeling severely depressed following the recent death of her son by suicide.  Today she is focused on being able to obtain and read a suicide note that her son reportedly wrote in which is currently in possession of the police. Denies suicidal thoughts and states that she has her older son and her father as reasons to stay alive and remain future oriented.  She does states she wish she would die and join her son.. Currently does not endorse medication side effects  Objective: I have discussed case with treatment team and have met with patient .  43 year old female, history of depression, PTSD, panic disorder.  Reports severe grief/depression following recent death of her 45 year old son who committed suicide last week. Prior to the above patient states she was stable and doing better, but acknowledges a history of chronic depression and anxiety.  Today patient remains grief stricken, tearful during much of the interview, focusing on a suicide letter that her son wrote and which she wants to be able to read.  This letter is currently in the possession of  authorities and reportedly will be given to patient's father. She continues to experience suicidal thoughts although without plan or intention and states that she wants to "stay alive" as she still has her older son and her father. Reports she has had brief visual images of her son, but does not endorse any clear psychotic symptoms and currently does not appear internally preoccupied or overtly psychotic.  No disruptive or agitated behaviors on unit.  On one-to-one for suicide precautions,. Patient reports fair sleep, fair appetite, states she has not been eating much since last week.   Principal Problem: Severe recurrent major depression without psychotic features ([place]) Diagnosis: Principal Problem:   Severe recurrent major depression without  psychotic features ([place])  Total Time spent with patient: 20 minutes  Past Psychiatric History: See admission H&P  Past Medical History:  Past Medical History:  Diagnosis Date  . Anxiety   . Arthritis    ruptured lumbar disc-careful with positioning  . Blood dyscrasia    protein s deficiency-no treatment since 2006  . Depression   . GERD (gastroesophageal reflux disease)   . Headache(784.0)   . Hemorrhoids   . High cholesterol   . Hyperlipemia   . Hypertension   . Hypothyroidism   . IBS (irritable bowel syndrome)   . MVP (mitral valve prolapse)    echo per dr Dagmar Hait  . Protein S deficiency ([place]) 2003   dvt/pulmonary embolus-on coumadin for 6 months then off-Sees [facility]  . Pulmonary embolism ([place])   . PVC (premature ventricular contraction)     Past Surgical History:  Procedure Laterality Date  . CESAREAN SECTION  2006  . CHOLECYSTECTOMY N/A 11/03/2014   Procedure: LAPAROSCOPIC CHOLECYSTECTOMY WITH INTRAOPERATIVE CHOLANGIOGRAM;  Surgeon: Georganna Skeans, MD;  Location: [facility];  Service: General;  Laterality: N/A;  . LAPAROSCOPIC ASSISTED VAGINAL HYSTERECTOMY  03/27/2011   Procedure: LAPAROSCOPIC ASSISTED VAGINAL HYSTERECTOMY;  Surgeon: Cyril Mourning, MD;  Location: [facility];  Service: Gynecology;  Laterality: N/A;  . SALPINGOOPHORECTOMY  03/27/2011   Procedure: SALPINGO OOPHERECTOMY;  Surgeon: Cyril Mourning, MD;  Location: [facility];  Service: Gynecology;  Laterality: Bilateral;  . TONSILLECTOMY  92  . vaginal reconstructive surgery  2001   Family History:  Family History  Problem Relation Age of Onset  . High blood pressure Mother   .  Hyperlipidemia Father        fathers side of family  . High blood pressure Father   . Hypertension Other        entire family on both sides  . Asthma Son   . Asthma Son   . Clotting disorder Maternal Uncle   . Clotting disorder Paternal Grandmother   . Heart disease Maternal Grandfather    Family Psychiatric  History: See  admission H&P Social History:  Social History   Substance and Sexual Activity  Alcohol Use Never  . Frequency: Never     Social History   Substance and Sexual Activity  Drug Use No    Social History   Socioeconomic History  . Marital status: Divorced    Spouse name: Not on file  . Number of children: 2  . Years of education: Not on file  . Highest education level: Not on file  Occupational History  . Occupation: nutrition    Employer: [employer]  Social Needs  . Financial resource strain: Not hard at all  . Food insecurity:    Worry: Never true    Inability: Never true  . Transportation needs:    Medical: No    Non-medical: No  Tobacco Use  . Smoking status: Former Smoker    Packs/day: 1.00    Years: 15.00    Pack years: 15.00    Types: Cigarettes    Last attempt to quit: 03/18/2004    Years since quitting: 14.2  . Smokeless tobacco: Never Used  Substance and Sexual Activity  . Alcohol use: Never    Frequency: Never  . Drug use: No  . Sexual activity: Yes  Lifestyle  . Physical activity:    Days per week: 3 days    Minutes per session: 30 min  . Stress: To some extent  Relationships  . Social connections:    Talks on phone: More than three times a week    Gets together: More than three times a week    Attends religious service: Never    Active member of club or organization: No    Attends meetings of clubs or organizations: Never    Relationship status: Divorced  Other Topics Concern  . Not on file  Social History Narrative   Diet: Regular   Caffeine: Yes   Married- divorced, married in 2006   House: Yes, 2 persons   Pets: 1 dog   Current/Past profession: [employer] 2002-2013   Exercise: Yes, walking   Living Will: No    DNR: No    POA/HPOA: No       Additional Social History:    Pain Medications: SEE MAR.  Prescriptions: Pt reports being prescribed Xanax, Trazodone, Prozac and other medications she cannot recall.  Over the  Counter: SEE MAR.  History of alcohol / drug use?: No history of alcohol / drug abuse   Sleep: Fair  Appetite:  Fair  Current Medications: Current Facility-Administered Medications  Medication Dose Route Frequency Provider Last Rate Last Dose  . acetaminophen (TYLENOL) tablet 650 mg  650 mg Oral Q6H PRN Lindon Romp A, NP   650 mg at 06/23/18 1027  . ALPRAZolam (XANAX XR) 24 hr tablet 2 mg  2 mg Oral Daily Sharma Covert, MD   2 mg at 06/23/18 1026  . ALPRAZolam Duanne Moron) tablet 1 mg  1 mg Oral BID PRN Sharma Covert, MD   1 mg at 06/21/18 0836  . ALPRAZolam Duanne Moron) tablet 2  mg  2 mg Oral QHS PRN Lindon Romp A, NP   2 mg at 06/21/18 2148  . alum & mag hydroxide-simeth (MAALOX/MYLANTA) 200-200-20 MG/5ML suspension 30 mL  30 mL Oral Q4H PRN Lindon Romp A, NP      . cholecalciferol (VITAMIN D3) tablet 1,000 Units  1,000 Units Oral Daily Sharma Covert, MD      . colestipol (COLESTID) tablet 2 g  2 g Oral BID Lindon Romp A, NP   2 g at 06/23/18 1026  . dicyclomine (BENTYL) tablet 20 mg  20 mg Oral TID AC & HS Sharma Covert, MD      . FLUoxetine (PROZAC) capsule 80 mg  80 mg Oral Daily Sharma Covert, MD   80 mg at 06/23/18 1027  . levothyroxine (SYNTHROID, LEVOTHROID) tablet 125 mcg  125 mcg Oral Q0600 Lindon Romp A, NP   125 mcg at 06/23/18 1026  . magnesium hydroxide (MILK OF MAGNESIA) suspension 30 mL  30 mL Oral Daily PRN Lindon Romp A, NP      . metoprolol tartrate (LOPRESSOR) tablet 25 mg  25 mg Oral BID Sharma Covert, MD   25 mg at 06/23/18 1026  . pantoprazole (PROTONIX) EC tablet 40 mg  40 mg Oral Daily Sharma Covert, MD      . traZODone (DESYREL) tablet 200 mg  200 mg Oral QHS,MR X 1 Sharma Covert, MD   Stopped at 06/23/18 0200    Lab Results:  No results found for this or any previous visit (from the past 48 hour(s)).  Blood Alcohol level:  No results found for: [facility]  Metabolic Disorder Labs: Lab Results  Component Value Date   HGBA1C  5.6 06/21/2018   MPG 114.02 06/21/2018   MPG 114 06/13/2017   No results found for: PROLACTIN Lab Results  Component Value Date   CHOL 258 (H) 06/21/2018   TRIG 185 (H) 06/21/2018   HDL 50 06/21/2018   CHOLHDL 5.2 06/21/2018   VLDL 37 06/21/2018   LDLCALC 171 (H) 06/21/2018   LDLCALC 172 (H) 03/13/2018    Physical Findings: AIMS:  , ,  ,  ,    CIWA:    COWS:     Musculoskeletal: Strength & Muscle Tone: decreased Gait & Station: unsteady -currently mobilizing in wheelchair  Patient leans: N/A  Psychiatric Specialty Exam: Physical Exam  Nursing note and vitals reviewed. Constitutional: She is oriented to person, place, and time. She appears well-developed and well-nourished.  HENT:  Head: Normocephalic and atraumatic.  Respiratory: Effort normal.  Neurological: She is alert and oriented to person, place, and time.    ROS no chest pain, no shortness of breath, no vomiting, reports feeling weak and dizzy at times  Blood pressure (!) 139/97, pulse 89, temperature 98 F (36.7 C), resp. rate 16, height 5' 9"  (1.753 m), weight 104.3 kg, last menstrual period 03/19/2011, SpO2 96 %.Body mass index is 33.97 kg/m.  General Appearance: Fairly groomed  Eye Contact:  Good  Speech:  Normal Rate  Volume:  Decreased  Mood:  Remains severely depressed  Affect:  Tearful and Sad  Thought Process:  Linear and Descriptions of Associations: Intact  Orientation:  Full (Time, Place, and Person)-she is currently fully alert, attentive, oriented x3  Thought Content:  Ruminative regarding her sons death, no current hallucinations or delusions noted or expressed  Suicidal Thoughts:  Yes.  without intent/plan-passive suicidal ideations without any current plan or intention, no homicidal or violent ideations  expressed  Homicidal Thoughts:  No  Memory:  Immediate;   Fair Recent;   Fair Remote;   Fair  Judgement:  Fair  Insight:  Fair  Psychomotor Activity:  Psychomotor Retardation   Concentration:  Concentration: Improving and Attention Span: Improving  Recall:  [employer] of Knowledge:  Fair  Language:  Good  Akathisia:  Negative  Handed:  Right  AIMS (if indicated):     Assets:  Communication Skills Desire for Improvement Housing Physical Health  ADL's:  Intact  Cognition:  WNL  Sleep:  Number of Hours: 5.75   Assessment:  43 year old female, history of depression, PTSD, panic disorder.  Reports severe grief/depression following recent death of her 53 year old son who committed suicide last week.  Patient remains depressed, constricted, grief stricken.  Currently endorses passive SI but denies plan or intent and identifies love for her older son and for her father as protective factors against suicide.  She is not currently presenting with psychotic symptoms.  Neurovegetative symptoms of depression, particularly poor sleep, low energy, poor appetite persist. Today focused on obtaining a suicide letter written by son.  Responds fairly well to reassurance, support.  With her expressed consent I have spoken with her father via phone who corroborates that letter is reportedly being given to him later today or tomorrow by authorities.  Currently denies medication side effects.  Of note, patient reports she has been on alprazolam for years.   Treatment Plan Summary: Daily contact with patient to assess and evaluate symptoms and progress in treatment, Medication management and Plan Patient is seen and examined.  Patient is a 43 year old female with the above-stated past psychiatric history seen in follow-up.   Treatment plan reviewed as below today 2/4 Encourage group and milieu participation to work on coping skills and symptom reduction Treatment team working on disposition planning options Continue Xanax 1 mg twice daily as needed for acute anxiety and 2 mg daily (standing)-we will continue to review discuss potentially starting a gradual benzodiazepine taper with  patient.  Hold if sedated. Continue Prozac 80 mg daily for depression and anxiety Continue Synthroid 125 mcg daily for hypothyroidism Continue Lopressor for hypertension Decrease trazodone to 100 mg nightly PRN for insomnia as needed Encourage p.o. fluids/intake Recheck BMP in a.m.Marland Kitchen    Jenne Campus, MD 06/23/2018, 1:01 PM   Patient ID: [name], female   DOB: 06/28/1975, 43 y.o.   MRN: [id number]

## 2018-06-24 LAB — BASIC METABOLIC PANEL
Anion gap: 8 (ref 5–15)
BUN: 11 mg/dL (ref 6–20)
CO2: 28 mmol/L (ref 22–32)
Calcium: 9.5 mg/dL (ref 8.9–10.3)
Chloride: 102 mmol/L (ref 98–111)
Creatinine, Ser: 1.12 mg/dL — ABNORMAL HIGH (ref 0.44–1.00)
GFR calc Af Amer: >60 mL/min (ref >60)
GFR calc non Af Amer: 60 mL/min — ABNORMAL LOW (ref >60)
Glucose, Bld: 100 mg/dL — ABNORMAL HIGH (ref 70–99)
Potassium: 4.3 mmol/L (ref 3.5–5.1)
Sodium: 138 mmol/L (ref 135–145)

## 2018-06-24 NOTE — Progress Notes (Addendum)
Nursing 1:1 Note  D: Pt attended wrap-up group this evening. Pt endorses SI and cannot contract for safety. Pt states "I hope to die in my sleep tonight". Pt states she is angry at the police for not being able to see her son's suicide letters. Pt interacts well with peers in the milieu. Trazodone requested and given. Support offered.  A: 1:1 observation continues for safety. Sitter within arms reach.  R: Pt remains safe at this time.

## 2018-06-24 NOTE — Progress Notes (Signed)
1:1 Note Pt observed sitting in the day room watching TV with peers at the beginning of the shift. Pt stated she is finally coming to terms with death of her son. Pt also stated she is happy her agreed that they should move out of their current house. Pt looked bright and calm, denied SI. Pt remains on 1:1 observation for safety, will continue to monitor.

## 2018-06-24 NOTE — Therapy (Signed)
Occupational Therapy Group Note  Date:  06/24/2018 Time:  11:35 AM  Group Topic/Focus:  Self Esteem Action Plan:   The focus of this group is to help patients create a plan to continue to build self-esteem after discharge.  Participation Level:  Minimal  Participation Quality:  Attentive  Affect:  Anxious, Depressed and Flat  Cognitive:  Appropriate  Insight: Limited  Engagement in Group:  Limited  Modes of Intervention:  Activity, Discussion, Education and Socialization  Additional Comments:    S: "People need to accept those with different sexualities"  O: OT tx with focus on self esteem building this date. Education given on definition of self esteem, with both causes of low and high self esteem identified. Activity given for pt to identify a positive/aspiring trait for each letter of the alphabet. Pt to work with peers to help complete activity and build positive thinking.   A: Pt presents to group with flat/depressed/axious affect, minimally engaged this date. Pt not sharing much during discussion other than agreeing with a pt on that others need to be more accepting of different sexualities. Pt seen completing A-Z activity at about ~50% completion independently, but was then pulled by a provider and did not return to group.  P: Education given on self esteem and how to improve this date. Handouts and activities given to help facilitate skills when reintegrating into community.   Zenovia Jarred, MSOT, OTR/L Behavioral Health OT/ Acute Relief OT PHP Office: [place] 06/24/2018, 11:35 AM

## 2018-06-24 NOTE — Progress Notes (Addendum)
[facility] MD Progress Note  06/24/2018 10:36 AM Andrea [name]  MRN:  [id number] Subjective: Continues to feel "numb".  Does acknowledge some improvement compared to how she felt at admission and states that she feels she has come to terms with the fact that her son is "gone" and is more accepting of "the reality".  She denies medication side effects.  She endorses intermittent passive thoughts of death/dying but denies any suicidal plan or intention and seems future oriented at this time speaking of relocating to [place] and living with her father after her discharge. She also describes a partially improved appetite.  She states she is still "not hungry" but is "forcing myself to eat more".  Objective: I have discussed case with treatment team and have met with patient .  42-year-old female, history of depression, PTSD, panic disorder.  Reports severe grief/depression following recent death of her 13-year-old son who committed suicide last week. Prior to the above patient states she was stable and doing better, but acknowledges a history of chronic depression and anxiety.  Patient presents with some improvement compared to yesterday.  Today more verbal, better eye contact, not as tearful.  As above, endorses persistent passive suicidal thoughts but no suicidal plan or intention and presents future oriented.  She states she plans to continue seeing her therapist after discharge, with whom she has a good therapeutic alliance, and plans to live with her father, who is very supportive. She remains focused on the suicide note that her son reportedly wrote.  She is focused on being able to obtain set letter and read it.  Of note, I received a call from Sheriff's department yesterday, explaining that letter is in police custody and that they will not be able to release it at this time. Patient's behavior is in good control, no psychomotor agitation or restlessness noted.  She seems more visible on unit and has  been going to some groups.  Currently does not endorse medication side effects.  Labs reviewed.   Principal Problem: Severe recurrent major depression without psychotic features (HCC) Diagnosis: Principal Problem:   Severe recurrent major depression without psychotic features (HCC)  Total Time spent with patient: 20 minutes  Past Psychiatric History: See admission H&P  Past Medical History:  Past Medical History:  Diagnosis Date  . Anxiety   . Arthritis    ruptured lumbar disc-careful with positioning  . Blood dyscrasia    protein s deficiency-no treatment since 2006  . Depression   . GERD (gastroesophageal reflux disease)   . Headache(784.0)   . Hemorrhoids   . High cholesterol   . Hyperlipemia   . Hypertension   . Hypothyroidism   . IBS (irritable bowel syndrome)   . MVP (mitral valve prolapse)    echo per dr avva  . Protein S deficiency (HCC) 2003   dvt/pulmonary embolus-on coumadin for 6 months then off-Sees [facility]  . Pulmonary embolism (HCC)   . PVC (premature ventricular contraction)     Past Surgical History:  Procedure Laterality Date  . CESAREAN SECTION  2006  . CHOLECYSTECTOMY N/A 11/03/2014   Procedure: LAPAROSCOPIC CHOLECYSTECTOMY WITH INTRAOPERATIVE CHOLANGIOGRAM;  Surgeon: Burke Thompson, MD;  Location: [facility] OR;  Service: General;  Laterality: N/A;  . LAPAROSCOPIC ASSISTED VAGINAL HYSTERECTOMY  03/27/2011   Procedure: LAPAROSCOPIC ASSISTED VAGINAL HYSTERECTOMY;  Surgeon: Michelle L Grewal, MD;  Location: [facility] ORS;  Service: Gynecology;  Laterality: N/A;  . SALPINGOOPHORECTOMY  03/27/2011   Procedure: SALPINGO OOPHERECTOMY;  Surgeon: Michelle L   Grewal, MD;  Location: [facility] ORS;  Service: Gynecology;  Laterality: Bilateral;  . TONSILLECTOMY  92  . vaginal reconstructive surgery  2001   Family History:  Family History  Problem Relation Age of Onset  . High blood pressure Mother   . Hyperlipidemia Father        fathers side of family  . High blood  pressure Father   . Hypertension Other        entire family on both sides  . Asthma Son   . Asthma Son   . Clotting disorder Maternal Uncle   . Clotting disorder Paternal Grandmother   . Heart disease Maternal Grandfather    Family Psychiatric  History: See admission H&P Social History:  Social History   Substance and Sexual Activity  Alcohol Use Never  . Frequency: Never     Social History   Substance and Sexual Activity  Drug Use No    Social History   Socioeconomic History  . Marital status: Divorced    Spouse name: Not on file  . Number of children: 2  . Years of education: Not on file  . Highest education level: Not on file  Occupational History  . Occupation: nutrition    Employer: [employer]  Social Needs  . Financial resource strain: Not hard at all  . Food insecurity:    Worry: Never true    Inability: Never true  . Transportation needs:    Medical: No    Non-medical: No  Tobacco Use  . Smoking status: Former Smoker    Packs/day: 1.00    Years: 15.00    Pack years: 15.00    Types: Cigarettes    Last attempt to quit: 03/18/2004    Years since quitting: 14.2  . Smokeless tobacco: Never Used  Substance and Sexual Activity  . Alcohol use: Never    Frequency: Never  . Drug use: No  . Sexual activity: Yes  Lifestyle  . Physical activity:    Days per week: 3 days    Minutes per session: 30 min  . Stress: To some extent  Relationships  . Social connections:    Talks on phone: More than three times a week    Gets together: More than three times a week    Attends religious service: Never    Active member of club or organization: No    Attends meetings of clubs or organizations: Never    Relationship status: Divorced  Other Topics Concern  . Not on file  Social History Narrative   Diet: Regular   Caffeine: Yes   Married- divorced, married in 2006   House: Yes, 2 persons   Pets: 1 dog   Current/Past profession: [employer]  2002-2013   Exercise: Yes, walking   Living Will: No    DNR: No    POA/HPOA: No       Additional Social History:    Pain Medications: SEE MAR.  Prescriptions: Pt reports being prescribed Xanax, Trazodone, Prozac and other medications she cannot recall.  Over the Counter: SEE MAR.  History of alcohol / drug use?: No history of alcohol / drug abuse   Sleep: Fair/improving  Appetite:  Fair/improving  Current Medications: Current Facility-Administered Medications  Medication Dose Route Frequency Provider Last Rate Last Dose  . acetaminophen (TYLENOL) tablet 650 mg  650 mg Oral Q6H PRN Berry, [name] A, NP   650 mg at 06/24/18 0642  . ALPRAZolam (XANAX XR) 24 hr tablet 2 mg    2 mg Oral Daily Clary, Greg Lawson, MD   2 mg at 06/24/18 0805  . ALPRAZolam (XANAX) tablet 1 mg  1 mg Oral BID PRN Clary, Greg Lawson, MD   1 mg at 06/21/18 0836  . alum & mag hydroxide-simeth (MAALOX/MYLANTA) 200-200-20 MG/5ML suspension 30 mL  30 mL Oral Q4H PRN Berry, [name] A, NP      . cholecalciferol (VITAMIN D3) tablet 1,000 Units  1,000 Units Oral Daily Clary, Greg Lawson, MD   1,000 Units at 06/24/18 0811  . colestipol (COLESTID) tablet 2 g  2 g Oral BID Berry, [name] A, NP   2 g at 06/23/18 1717  . dicyclomine (BENTYL) tablet 20 mg  20 mg Oral TID AC & HS Clary, Greg Lawson, MD   20 mg at 06/24/18 0626  . FLUoxetine (PROZAC) capsule 80 mg  80 mg Oral Daily Clary, Greg Lawson, MD   80 mg at 06/24/18 0813  . levothyroxine (SYNTHROID, LEVOTHROID) tablet 125 mcg  125 mcg Oral Q0600 Berry, [name] A, NP   125 mcg at 06/24/18 0628  . magnesium hydroxide (MILK OF MAGNESIA) suspension 30 mL  30 mL Oral Daily PRN Berry, [name] A, NP      . metoprolol tartrate (LOPRESSOR) tablet 25 mg  25 mg Oral BID Clary, Greg Lawson, MD   25 mg at 06/24/18 0814  . pantoprazole (PROTONIX) EC tablet 40 mg  40 mg Oral Daily Clary, Greg Lawson, MD   40 mg at 06/24/18 0805  . traZODone (DESYREL) tablet 100 mg  100 mg Oral QHS PRN ,   A, MD   100 mg at 06/23/18 2341    Lab Results:  Results for orders placed or performed during the hospital encounter of 06/20/18 (from the past 48 hour(s))  Basic metabolic panel     Status: Abnormal   Collection Time: 06/24/18  6:47 AM  Result Value Ref Range   Sodium 138 135 - 145 mmol/L   Potassium 4.3 3.5 - 5.1 mmol/L   Chloride 102 98 - 111 mmol/L   CO2 28 22 - 32 mmol/L   Glucose, Bld 100 (H) 70 - 99 mg/dL   BUN 11 6 - 20 mg/dL   Creatinine, Ser 1.12 (H) 0.44 - 1.00 mg/dL   Calcium 9.5 8.9 - 10.3 mg/dL   GFR calc non Af Amer 60 (L) >60 mL/min   GFR calc Af Amer >60 >60 mL/min   Anion gap 8 5 - 15    Comment: Performed at [facility], [address] [place], [place] [address]    Blood Alcohol level:  No results found for: ETH  Metabolic Disorder Labs: Lab Results  Component Value Date   HGBA1C 5.6 06/21/2018   MPG 114.02 06/21/2018   MPG 114 06/13/2017   No results found for: PROLACTIN Lab Results  Component Value Date   CHOL 258 (H) 06/21/2018   TRIG 185 (H) 06/21/2018   HDL 50 06/21/2018   CHOLHDL 5.2 06/21/2018   VLDL 37 06/21/2018   LDLCALC 171 (H) 06/21/2018   LDLCALC 172 (H) 03/13/2018    Physical Findings: AIMS:  , ,  ,  ,    CIWA:    COWS:     Musculoskeletal: Strength & Muscle Tone: decreased Gait & Station: unsteady -currently mobilizing in wheelchair  Patient leans: N/A  Psychiatric Specialty Exam: Physical Exam  Nursing note and vitals reviewed. Constitutional: She is oriented to person, place, and time. She appears well-developed and well-nourished.    HENT:  Head: Normocephalic and atraumatic.  Respiratory: Effort normal.  Neurological: She is alert and oriented to person, place, and time.    ROS no chest pain, no shortness of breath, no vomiting  Blood pressure 104/89, pulse 79, temperature 99.4 F (37.4 C), temperature source Oral, resp. rate 17, height 5' 9" (1.753 m), weight 104.3 kg, last menstrual  period 03/19/2011, SpO2 96 %.Body mass index is 33.97 kg/m.  General Appearance: Improving grooming  Eye Contact:  Good  Speech:  Normal Rate  Volume:  Normal  Mood:  Remains depressed but presents with a partially improved mood compared to yesterday  Affect:  Congruent-not tearful at this time  Thought Process:  Linear and Descriptions of Associations: Intact  Orientation:  Full (Time, Place, and Person)-she is currently fully alert, attentive, oriented x3  Thought Content:  Ruminative regarding her sons death, no current hallucinations or delusions noted or expressed  Suicidal Thoughts:  Yes.  without intent/plan-denies suicidal plan or intention, contracts for safety,  Homicidal Thoughts:  No  Memory:  Recent and remote grossly intact  Judgement:  improving  Insight:  Improving  Psychomotor Activity:  Less psychomotor retardation, more visible on unit  Concentration:  Concentration: Improving and Attention Span: Improving  Recall:  Good  Fund of Knowledge:  Good  Language:  Good  Akathisia:  Negative  Handed:  Right  AIMS (if indicated):     Assets:  Communication Skills Desire for Improvement Housing Physical Health  ADL's:  Intact  Cognition:  WNL  Sleep:  Number of Hours: 5.5   Assessment:  42-year-old female, history of depression, PTSD, panic disorder.  Reports severe grief/depression following recent death of her 13-year-old son who committed suicide last week.  Patient remains depressed, sad, ruminative about recent loss of her son, focused on obtaining letters that he reportedly wrote.  She does present with some improvement and I notice improving grooming, patient is more verbal/communicative today, and presents more future oriented, speaking of her plans following discharge.  At this time denies suicidal plan or intention but continues to endorse passive SI.  Denies medication side effects.  Treatment Plan Summary: Daily contact with patient to assess and evaluate  symptoms and progress in treatment, Medication management and Plan Patient is seen and examined.  Patient is a 42-year-old female with the above-stated past psychiatric history seen in follow-up.   Treatment plan reviewed as below today 2/5 Encourage group and milieu participation to work on coping skills and symptom reduction Continue one-to-one observation for safety. Treatment team working on disposition planning options Continue Xanax 1 mg twice daily as needed for acute anxiety and 2 mg daily (standing)-we will continue to review discuss potentially starting a gradual benzodiazepine taper with patient.  Hold if sedated. Continue Prozac 80 mg daily for depression and anxiety Continue Synthroid 125 mcg daily for hypothyroidism Continue Lopressor for hypertension Continue Trazodone 100 mg nightly PRN for insomnia as needed Encourage p.o. fluids/intake     A , MD 06/24/2018, 10:36 AM   Patient ID: Andrea [name], female   DOB: 07/08/1975, 42 y.o.   MRN: [id number]  

## 2018-06-24 NOTE — Progress Notes (Signed)
Pt states trazodone 100 is not effective. Pt states she can't go to sleep. Pt observe in dayroom interacting with peers in milieu up to closing. Pt encourage to speak with provider.

## 2018-06-24 NOTE — Progress Notes (Signed)
1;1 Note Pt requested trazodone for sleeping stating it does not make her dream, she does not want to dream about his son. Pt then went to sleep, observed sleeping with no issues, staff by the bedside, will continue to monitor.

## 2018-06-24 NOTE — Progress Notes (Signed)
1:1 Note 1020  Patient has talked to MD in his office.  Patient has attended morning group with 1:1.  Respirations even and unlabored.  No signs/symptoms of pain/distress noted on patient's face/body movements.  Patient was walking in hallway, no wheelchair use.  Presently patient is sitting in group listening and shaking her head yes to what is being said.  Patient looks relaxed and not tearful at this time.  Will continue to monitor this patient with 1:1 per MD order.

## 2018-06-24 NOTE — Progress Notes (Signed)
1:1 Note.  Pt observed lying in bed a sleep at 6 am. No issue reported or observed. Remains on 1:1 observation for safety, will continue to monitor.

## 2018-06-24 NOTE — Progress Notes (Signed)
Patient did not attend 3:15 p.m. psychoeducational group this afternoon.  

## 2018-06-24 NOTE — Plan of Care (Signed)
Nurse discussed anxiety, depression, coping skills with patient. 

## 2018-06-24 NOTE — Progress Notes (Signed)
1:1 Note 1750  Patient became very upset after talking to her dad over the phone.  Dad told her that the police still have the letters her son wrote and that the letters will not be released at this time.  Patient crying and talking very loudly that she is going to hurt herself, that she has a right to know why her son killed himself.  MD informed.  Nurse and MHTs have talked to patient.  Patient sitting on bed talking to MHT at this time.  1:1 continues per MD order for safety.

## 2018-06-24 NOTE — Progress Notes (Signed)
Patient shared in group that she had a good start to her day but ended on a negative note. She states that she is trying to get better and is still waiting to read her son's suicide notes. Her goal for tomorrow is to obtain her son's notes and begin to work on her grief.

## 2018-06-24 NOTE — Progress Notes (Signed)
1:1 Note 8022  Patient walked in hall, stated she was feeling better this afternoon.  Took shower, washed hair, brushed teeth.  Patient stated she still felt alittle dizzy, lightheaded.  Stated she passed out several times before she came to [facility].  Therapist Debria Garret told patient that she handled her situation better than some other people would have handled this situation.  Patient stated she may have a nervous breakdown and have to come back to [facility].  Triggers are at her house.  Dad and she will move because she can't live there any more.  Dad put triggers in boxes and packed away.  Respirations even and unlabored.  Patient stated when she got out of shower she saw a string on a towel and thought about hurting herself.  Stated she gave the string to MHT.  1:1 continues per MD order for safety.

## 2018-06-24 NOTE — Progress Notes (Signed)
1:1 Note 1430  Patient left group walking in hallway.  1:1 present for safety.  Patient participated in group.  Presently patient is laying on R side in bed talking to RN/MHT.  Patient stated she realized yesterday that her son is dead and not coming back.  That her dad will find them a place to live near their church in [place].  That she promised her older son that she would not hurt herself.  That her son and dad do need her.  When she as younger, her parents divorced.  Patient told her mother that she was going to kill herself and her mother told her "to go ahead".  Her mother has been married 35 yrs and treats her better now than she did when she was younger.  She has thought several times in the past of killing herself but never actually attempted to hurt herself.  She feels that if she went home, she might hurt herself but contracted for safety here at [facility].  Patient has been talking to MHT.   Patient stated she ate about 50% of her lunch and has been drinking fluids today. 1:1 continues for safety per MD order.

## 2018-06-24 NOTE — Progress Notes (Signed)
1:1 Note 1900   Patient sitting on bed, talking to family members.  Respirations even and unlabored.  No signs/symptoms of pain/distress noted on patient's face/body movements. 1:1 continues for safety.

## 2018-06-24 NOTE — Progress Notes (Signed)
1:1  Note 0800  Patient sitting in bed eating breakfast.  Patient stated she has realized that her son is dead now.  She knows that he is not coming back.  Patient's dad is going to find a new place to live.  Patient wants to avoid her triggers.  She knows that her church family is praying for her and all of her family.  She feels their prayers are lifting her spirits.  Patient stated she promised her older son that she would not hurt herself.   She knows that her older son needs her very much.  That her entire family needs her.  Patient has not read her son's letters that he wrote.  She knows that she will read son's letters before she leaves this hospital.  Patient does not know why he did this, that she needs to understand why, because they were so close.  1:1 continues for safety per MD order.

## 2018-06-25 MED ORDER — TRAZODONE HCL 100 MG PO TABS
200.0000 mg | ORAL_TABLET | Freq: Every evening | ORAL | Status: DC | PRN
  Administered 2018-06-25: 200 mg via ORAL
  Filled 2018-06-25: qty 2

## 2018-06-25 NOTE — Progress Notes (Signed)
Nursing 1:1Note  D: Ptobserve sleeping with eyes closed. Respirations even and unlabored. No signs of distress.  A: 1:1 observation continues for safety. Sitter within arms reach. R:Pt remains safeat this time.

## 2018-06-25 NOTE — [facility] Group Notes (Signed)
[facility] Mental [facility] Group Therapy      06/25/2018 3:16 PM  Type of Therapy: Mental Health Association Presentation  Participation Level: Did Not Attend     Summary of Progress/Problems: [place] ([place]) Speaker came to talk about his personal journey with mental health. The pt processed ways by which to relate to the speaker. [place] speaker provided handouts and educational information pertaining to groups and services offered by the [facility].   Invited, chose not to attend.    [place] Social Worker

## 2018-06-25 NOTE — Progress Notes (Signed)
D:Pt has been out in the dayroom some this afternoon interacting on the unit. Pt says that she saw pictures of her son with her therapist. She says that when she saw the pictures she could only see sadness in him.  A:Offered support and 1:1 monitoring.  R:Safety maintained on the unit.

## 2018-06-25 NOTE — Progress Notes (Signed)
D:Pt has been sleeping all morning and writer woke up pt to get vital signs, give breakfast and give morning medication. Pt says that she never wants to wake up. She is tearful talking about the death of her son and wanting to see letters that he wrote prior to his death.  A:Supported pt to discuss feelings. Offered 1:1 monitoring and encouragement. R:Safety maintained on the unit.

## 2018-06-25 NOTE — Progress Notes (Signed)
Pt was sitting up in bed when I arrived.  She said she felt as though she had a setback. She said she has been crying and said she wants to be dead.  She showed me pictures in a small album that her therapist had given her.  The pictures included pictures of the therapist, a special necklace and her chair in the therapist's office. Her personal therapist joined [place] and added pictures of [name] (her deceased son) to the album. Pt took her time and allowed herself to look at the pictures as she held our hands. She searched and thought she saw sadness in one picture. She continues to say she needs the letters for answers; even if it is bad she said she wants to know. She said she will be able to decipher what [name] was saying even if he were talking in code. She said she can't move on until she has read the letters.  Pt was tearful during certain points of our visit. [place], MDiv   06/25/18 1400  Clinical Encounter Type  Visited With Patient and family together

## 2018-06-25 NOTE — Progress Notes (Signed)
Nursing 1:1 Note  D: Pt observe sleeping with eyes closed. Respirations even and unlabored. No signs of distress.  A: 1:1 observation continues for safety. Sitter within arms reach.  R: Pt remains safe at this time.
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
## 2018-06-25 NOTE — Progress Notes (Signed)
D:Pt has been in her room visiting with her outpatient therapist. A:Pt continues to be monitored 1:1. R:Safety maintained on the unit.

## 2018-06-25 NOTE — Progress Notes (Addendum)
Nursing 1:1 Note  D: Pt attended wrap-up group this evening. Pt endorses SI and cannot contract for safety. Pt interacts well with peers in the milieu. Pt states she had a good visit with counselor. Pt was brought pictures of her son.Trazodone increase to 200 at bedtime.Support offered. No new c/o's.  A: 1:1 observation continues for safety. Sitter within arms reach.  R: Pt remains safe at this time.

## 2018-06-26 MED ORDER — OSELTAMIVIR PHOSPHATE 75 MG PO CAPS
75.0000 mg | ORAL_CAPSULE | Freq: Every day | ORAL | Status: DC
  Administered 2018-06-26 – 2018-07-04 (×9): 75 mg via ORAL
  Filled 2018-06-26 (×10): qty 1

## 2018-06-26 MED ORDER — TRAZODONE HCL 100 MG PO TABS
100.0000 mg | ORAL_TABLET | Freq: Every evening | ORAL | Status: DC | PRN
  Administered 2018-06-26 – 2018-06-27 (×2): 100 mg via ORAL
  Filled 2018-06-26 (×2): qty 1

## 2018-06-26 MED ORDER — ALPRAZOLAM 0.5 MG PO TABS
0.5000 mg | ORAL_TABLET | Freq: Three times a day (TID) | ORAL | Status: DC | PRN
  Administered 2018-06-26 – 2018-06-28 (×3): 0.5 mg via ORAL
  Filled 2018-06-26 (×3): qty 1

## 2018-06-26 NOTE — Progress Notes (Signed)
D: Pt is in dayroom talking to peer upon initial approach.  She presents with depressed affect and mood.  She reports her day was "not good because I want my son back."  Pt states "I just want to go home and be with my daddy."  Pt reports she had a good visit with her son and her parents tonight.  She endorses SI without a plan and states "I feel suicidal everyday of my life."  She denies HI, denies hallucinations, denies pain.  Pt attended evening group.  A: Met with pt 1:1.  Actively listened to pt and offered support and encouragement.  1:1 staff remains with pt for safety.  R: Pt is safe on the unit.  She verbally contracts for Oncologist.  Will continue to monitor and assess.

## 2018-06-26 NOTE — Progress Notes (Signed)
Recreation Therapy Notes  Date:  2.7.Marland Kitchen20 Time: 0930 Location: 300 [address] Dayroom  Group Topic: Stress Management  Goal Area(s) Addresses:  Patient will identify positive stress management techniques. Patient will identify benefits of using stress management post d/c.  Intervention:  Stress Management  Activity :   Meditation.  LRT introduced the stress management technique of meditation.  LRT played a meditation that focused on the idea of "pure possibility" to bring into the day.  Patients were to listen to the meditation and follow along as it played to engage.  Education:  Stress Management, Discharge Planning.   Education Outcome: Acknowledges Education  Clinical Observations/Feedback:  Pt did not attend group.      Victorino Sparrow, LRT/CTRS         Victorino Sparrow A 06/26/2018 11:32 AM

## 2018-06-26 NOTE — Progress Notes (Signed)
D Late entry 1:1 for 1000    Pt is observed sitting straight up in her bed- indian-style with her legs tucked under each other. She wears hospital-issue patient scrubs, she is seen holding a patient water pitcher and she sips on this as she speaks to this Probation officer. Her affect is flat, expressionless. She is depressed and blunted. When writer zsks her if she's ready to take her morning medications, she replies" I'm supposed to be [employer]...its what I do".      A She says she chose not to eat breakfast today, saying " I'm really not a breakfast person.". Pt is instructed to try to eat just a few bites at breakfast time, the need for her body to deal with stress is heightened and she needs more calories now, to help her healing as well as overall functioning. She says she doubts she can do this. HEr affect is blank, she stares off into space. She says " I can't go back to that house..my room is past [name]'s and that means I would have to walk by his room every day...just to get to my room. I told my father just find [place] someplace to live...anywhere ..as  long as I don't have to go back to that house". She frequently stop smid sentence and stares straight ahead. She is unable to contract for safety.     R 1:1 in place per MD order.

## 2018-06-26 NOTE — Progress Notes (Addendum)
Nursing 1:1Note  D: Ptobserve sleeping with eyes closed. Respirations even and unlabored. No acute distress. A: 1:1 observation continues for safety. Sitter within arms reach. R:Pt remains safeat this time.

## 2018-06-26 NOTE — Progress Notes (Signed)
1:1 Late entry for 1700:     D Pt is observed sitting on her bed with 1:1 MHT at her side. She endorses a flat, blank affect. She says " would you give me some tylenol for my hip pain"?      A Pt medicated per MD order. Pt unable to contract for safeyt " I cannot be safe" and 1:1 in place.     R Support offered and safety maintianed.

## 2018-06-26 NOTE — Progress Notes (Addendum)
D Pt is observed sitting on her bed in her room. She endorses a flat, depressed affect. She stares straight ahead and when writer calls her name , she jumps in startled response.     A She has been observed talking and socializing with other patients. She says things like " I shouldn't be by myself" and " I don't know what I'm going to do" and " how am I going to live" but will not elaborate when writer asks her to. She is unable to contract for safety.    R  1:1 continues and safety is in place.

## 2018-06-26 NOTE — Progress Notes (Deleted)
D Pt is observed sitting on the side of her bed with her 1:1 MHT at her side. She is hunched over, as she holds her breakfast plate and feeds herself. She has dark circles under her eyes. Her breathing is slow, labored, she has a deep croup-sounding cough. She complains of pain in her chest- both with inspiration and expiration. Her breath sounds are coarse,  decreased bilaterally and her pulse ox is checked on room air= 87 0/0. She says " I feel like I'm dying..I feel awful".      A  RN spoke with physician. Tamiflu therapy initiated ( 30 mg) and pt educated. Dr Parke Poisson spoke with ED physician at [facility], in lieu of patient's decompensating neuro logical status and her flu, she is at a high risk for developing pneumonia. He is sending pt to ED for evaluation .     R Safety in place

## 2018-06-26 NOTE — Progress Notes (Signed)
Nursing 1:1Note  D: Ptobserve sleeping with eyes closed. Respirations even and unlabored. No acute distress. A: 1:1 observation continues for safety. Sitter within arms reach. R:Pt remains safeat this time.

## 2018-06-26 NOTE — Progress Notes (Signed)
Progress note: Patient has been identified as potentially exposed to other patient with influenza. We will provide prophylaxis with tamiflu.

## 2018-06-26 NOTE — Tx Team (Signed)
Interdisciplinary Treatment and Diagnostic Plan Update  06/26/2018 Time of Session:  Andrea [name] MRN: [id number]  Principal Diagnosis: Severe recurrent major depression without psychotic features [facility])  Secondary Diagnoses: Principal Problem:   Severe recurrent major depression without psychotic features ([place])   Current Medications:  Current Facility-Administered Medications  Medication Dose Route Frequency Provider Last Rate Last Dose  . acetaminophen (TYLENOL) tablet 650 mg  650 mg Oral Q6H PRN Lindon Romp A, NP   650 mg at 06/24/18 2841  . ALPRAZolam (XANAX XR) 24 hr tablet 2 mg  2 mg Oral Daily Sharma Covert, MD   2 mg at 06/26/18 1115  . ALPRAZolam Duanne Moron) tablet 0.5 mg  0.5 mg Oral TID PRN Cobos, Myer Peer, MD      . alum & mag hydroxide-simeth (MAALOX/MYLANTA) 200-200-20 MG/5ML suspension 30 mL  30 mL Oral Q4H PRN Lindon Romp A, NP      . cholecalciferol (VITAMIN D3) tablet 1,000 Units  1,000 Units Oral Daily Sharma Covert, MD   1,000 Units at 06/26/18 1116  . colestipol (COLESTID) tablet 2 g  2 g Oral BID Lindon Romp A, NP   2 g at 06/26/18 1114  . dicyclomine (BENTYL) tablet 20 mg  20 mg Oral TID AC & HS Sharma Covert, MD   20 mg at 06/26/18 1116  . FLUoxetine (PROZAC) capsule 80 mg  80 mg Oral Daily Sharma Covert, MD   80 mg at 06/26/18 1114  . levothyroxine (SYNTHROID, LEVOTHROID) tablet 125 mcg  125 mcg Oral Q0600 Lindon Romp A, NP   125 mcg at 06/26/18 3244  . magnesium hydroxide (MILK OF MAGNESIA) suspension 30 mL  30 mL Oral Daily PRN Lindon Romp A, NP      . metoprolol tartrate (LOPRESSOR) tablet 25 mg  25 mg Oral BID Sharma Covert, MD   25 mg at 06/26/18 1116  . pantoprazole (PROTONIX) EC tablet 40 mg  40 mg Oral Daily Sharma Covert, MD   40 mg at 06/26/18 1117  . traZODone (DESYREL) tablet 100 mg  100 mg Oral QHS PRN Cobos, Myer Peer, MD       PTA Medications: Medications Prior to Admission  Medication Sig Dispense Refill  Last Dose  . ALPRAZolam (XANAX XR) 2 MG 24 hr tablet Take 1 tablet by mouth 2 (two) times daily.     Marland Kitchen ALPRAZolam (XANAX) 1 MG tablet Take 1 tablet by mouth 2 (two) times daily as needed.     . colestipol (COLESTID) 1 g tablet Take 2 tablets (2 g total) by mouth 2 (two) times daily. 120 tablet 6   . esomeprazole (NEXIUM) 40 MG capsule Take 1 capsule by mouth daily.     Marland Kitchen ezetimibe (ZETIA) 10 MG tablet TAKE 1 TABLET BY MOUTH EVERY DAY 90 tablet 1 Taking  . FLUoxetine (PROZAC) 40 MG capsule Take 80 mg by mouth daily.   Taking  . lamoTRIgine (LAMICTAL) 150 MG tablet Take 150 mg by mouth daily.   Taking  . losartan (COZAAR) 100 MG tablet TAKE 1 TABLET BY MOUTH EVERY DAY 90 tablet 1 Taking  . lurasidone (LATUDA) 20 MG TABS tablet Take 20 mg by mouth daily.   Taking  . metoprolol tartrate (LOPRESSOR) 25 MG tablet TAKE 1 TABLET BY MOUTH TWICE DAILY 180 tablet 1 Taking  . metroNIDAZOLE (FLAGYL) 500 MG tablet Take 1 tablet by mouth 2 (two) times daily. X 7 days ordered 06/20/18     . oxyCODONE-acetaminophen (  PERCOCET) 7.5-325 MG tablet Take 1 tablet by mouth every 6 (six) hours as needed for severe pain.   Taking  . rosuvastatin (CRESTOR) 20 MG tablet Take one tablet by mouth once daily 90 tablet 0   . SYNTHROID 125 MCG tablet TAKE 1 TABLET(125 MCG) BY MOUTH DAILY BEFORE BREAKFAST 30 tablet 6 Taking  . trazodone (DESYREL) 300 MG tablet Take 300 mg by mouth at bedtime.   Taking  . valACYclovir (VALTREX) 1000 MG tablet Take 2 tabs po BID x 1 day for outbreaks as needed 40 tablet 3 Taking  . Vitamin D, Ergocalciferol, (DRISDOL) 1.25 MG (50000 UT) CAPS capsule Take 1 capsule by mouth every 7 (seven) days.       Patient Stressors: Traumatic event  Patient Strengths: Physical Health  Treatment Modalities: Medication Management, Group therapy, Case management,  1 to 1 session with clinician, Psychoeducation, Recreational therapy.   Physician Treatment Plan for Primary Diagnosis: Severe recurrent major  depression without psychotic features ([place]) Long Term Goal(s): Improvement in symptoms so as ready for discharge Improvement in symptoms so as ready for discharge   Short Term Goals: Ability to disclose and discuss suicidal ideas Ability to demonstrate self-control will improve Ability to identify and develop effective coping behaviors will improve Ability to maintain clinical measurements within normal limits will improve Compliance with prescribed medications will improve Ability to identify triggers associated with substance abuse/mental health issues will improve  Medication Management: Evaluate patient's response, side effects, and tolerance of medication regimen.  Therapeutic Interventions: 1 to 1 sessions, Unit Group sessions and Medication administration.  Evaluation of Outcomes: Not Met  Physician Treatment Plan for Secondary Diagnosis: Principal Problem:   Severe recurrent major depression without psychotic features ([address])  Long Term Goal(s): Improvement in symptoms so as ready for discharge Improvement in symptoms so as ready for discharge   Short Term Goals: Ability to disclose and discuss suicidal ideas Ability to demonstrate self-control will improve Ability to identify and develop effective coping behaviors will improve Ability to maintain clinical measurements within normal limits will improve Compliance with prescribed medications will improve Ability to identify triggers associated with substance abuse/mental health issues will improve     Medication Management: Evaluate patient's response, side effects, and tolerance of medication regimen.  Therapeutic Interventions: 1 to 1 sessions, Unit Group sessions and Medication administration.  Evaluation of Outcomes: Not Met   RN Treatment Plan for Primary Diagnosis: Severe recurrent major depression without psychotic features ([place]) Long Term Goal(s): Knowledge of disease and therapeutic regimen to maintain health will  improve  Short Term Goals: Ability to verbalize frustration and anger appropriately will improve, Ability to participate in decision making will improve, Ability to verbalize feelings will improve, Ability to disclose and discuss suicidal ideas and Ability to identify and develop effective coping behaviors will improve  Medication Management: RN will administer medications as ordered by provider, will assess and evaluate patient's response and provide education to patient for prescribed medication. RN will report any adverse and/or side effects to prescribing provider.  Therapeutic Interventions: 1 on 1 counseling sessions, Psychoeducation, Medication administration, Evaluate responses to treatment, Monitor vital signs and CBGs as ordered, Perform/monitor CIWA, COWS, AIMS and Fall Risk screenings as ordered, Perform wound care treatments as ordered.  Evaluation of Outcomes: Not Met   LCSW Treatment Plan for Primary Diagnosis: Severe recurrent major depression without psychotic features ([place]) Long Term Goal(s): Safe transition to appropriate next level of care at discharge, Engage patient in therapeutic group addressing interpersonal concerns.  Short Term Goals: Engage patient in aftercare planning with referrals and resources, Increase social support and Increase skills for wellness and recovery  Therapeutic Interventions: Assess for all discharge needs, 1 to 1 time with Social worker, Explore available resources and support systems, Assess for adequacy in community support network, Educate family and significant other(s) on suicide prevention, Complete Psychosocial Assessment, Interpersonal group therapy.  Evaluation of Outcomes: Not Met   Progress in Treatment: Attending groups: No. Participating in groups: No. Taking medication as prescribed: Yes. Toleration medication: Yes. Family/Significant other contact made: No, will contact:  patient's mother or father  Patient understands  diagnosis: Yes. Discussing patient identified problems/goals with staff: Yes. Medical problems stabilized or resolved: No. Denies suicidal/homicidal ideation: No. Patient currently on 1:1 Issues/concerns per patient self-inventory: No Other:   New problem(s) identified: None   New Short Term/Long Term Goal(s): medication stabilization, elimination of SI thoughts, development of comprehensive mental wellness plan.   Patient Goals:    Discharge Plan or Barriers: Patient lives with he father currently. Patient currently follows up with Dr. Toy Care for medication management and Debria Garret for therapy services. The patient's medication management provider and therapist are recommending long term treatment for the patient at discharge. CSW will continue to follow and assess for appropriate referrals and possible discharge planning. 06/26/2018: Patient continues to refuse long term treatment at this time. CSW and OP therapist, Debria Garret spoke with patient regarding longer term treatment for continuity of care at discharge. CSW will continue to follow.   Reason for Continuation of Hospitalization: Anxiety Depression Medication stabilization Suicidal ideation  Estimated Length of Stay: 07/01/2018  Attendees: Patient: 06/26/2018 1:09 PM  Physician: Dr. Myles Lipps, MD; Dr. Neita Garnet, MD 06/26/2018 1:09 PM  Nursing: Opal Sidles.Jenetta Downer, RN; Patty.D, RN 06/26/2018 1:09 PM  RN Care Manager: 06/26/2018 1:09 PM  Social Worker: Radonna Ricker, [place] 06/26/2018 1:09 PM  Recreational Therapist:  06/26/2018 1:09 PM  Other: Harriett Sine, NP  06/26/2018 1:09 PM  Other:  06/26/2018 1:09 PM  Other: 06/26/2018 1:09 PM    Scribe for Treatment Team: Marylee Floras, [place] 06/26/2018 1:09 PM

## 2018-06-26 NOTE — Progress Notes (Signed)
[facility] MD Progress Note  06/26/2018 11:30 AM Andrea [name]  MRN:  [id number] Subjective: Patient continues to report depression, sadness, acknowledges intermittent suicidal ideations which "come and go", continues to focus on being able to read suicide note left behind by her son and which is currently in possession of police department.  Does not endorse medication side effects.   Objective: I have discussed case with treatment team, have met with patient.  43 year old female, history of depression, PTSD, panic disorder.  Reports severe grief/depression following recent death of her 45 year old son who committed suicide last week. As above, patient reports persistent depression, ruminations regarding loss of her son, states "I am feeling obsessed" regarding being able to obtain copy of the suicide note written by son, and states "he did not like writing, he wrote 3 pages, as his mother I need to be able to read them". Patient is currently denying suicidal plan or intention and presents future oriented, with a plan of relocating after discharge in order to live with her father for added support and to focus on her surviving son, who is 89 years old. She does report intermittent suicidal thoughts, and "thinking of ways to kill myself", but states "I have had suicidal thoughts for many years so they are not new". Yesterday, at patient's request and with her expressed consent I spoke with her outpatient therapist who was visiting her on unit.  She has been seen therapist for years and has a good therapeutic alliance.  Therapist reported feeling concerned due to patient making future oriented statements but still entertaining suicidal ideations and also making statements regarding wanting to join her son.  She also reported patient had made some threatening statements towards Sheriff's department, with regards to them having possession of letter and not having relisted to her yet.  I have reviewed this  issue with patient today-she denies any history of violence, denies any violent or homicidal ideations towards anybody, she specifically denies any homicidal or violent ideations towards [place] department/police and states "I would not her to fly, I was just venting". Denies medication side effects  Principal Problem: Severe recurrent major depression without psychotic features ([place]) Diagnosis: Principal Problem:   Severe recurrent major depression without psychotic features ([place])  Total Time spent with patient: 25 minutes  Past Psychiatric History: See admission H&P  Past Medical History:  Past Medical History:  Diagnosis Date  . Anxiety   . Arthritis    ruptured lumbar disc-careful with positioning  . Blood dyscrasia    protein s deficiency-no treatment since 2006  . Depression   . GERD (gastroesophageal reflux disease)   . Headache(784.0)   . Hemorrhoids   . High cholesterol   . Hyperlipemia   . Hypertension   . Hypothyroidism   . IBS (irritable bowel syndrome)   . MVP (mitral valve prolapse)    echo per dr Dagmar Hait  . Protein S deficiency ([place]) 2003   dvt/pulmonary embolus-on coumadin for 6 months then off-Sees [facility]  . Pulmonary embolism ([place])   . PVC (premature ventricular contraction)     Past Surgical History:  Procedure Laterality Date  . CESAREAN SECTION  2006  . CHOLECYSTECTOMY N/A 11/03/2014   Procedure: LAPAROSCOPIC CHOLECYSTECTOMY WITH INTRAOPERATIVE CHOLANGIOGRAM;  Surgeon: Georganna Skeans, MD;  Location: [place];  Service: General;  Laterality: N/A;  . LAPAROSCOPIC ASSISTED VAGINAL HYSTERECTOMY  03/27/2011   Procedure: LAPAROSCOPIC ASSISTED VAGINAL HYSTERECTOMY;  Surgeon: Cyril Mourning, MD;  Location: [facility];  Service: Gynecology;  Laterality: N/A;  . SALPINGOOPHORECTOMY  03/27/2011   Procedure: SALPINGO OOPHERECTOMY;  Surgeon: Cyril Mourning, MD;  Location: [facility];  Service: Gynecology;  Laterality: Bilateral;  . TONSILLECTOMY  92  . vaginal  reconstructive surgery  2001   Family History:  Family History  Problem Relation Age of Onset  . High blood pressure Mother   . Hyperlipidemia Father        fathers side of family  . High blood pressure Father   . Hypertension Other        entire family on both sides  . Asthma Son   . Asthma Son   . Clotting disorder Maternal Uncle   . Clotting disorder Paternal Grandmother   . Heart disease Maternal Grandfather    Family Psychiatric  History: See admission H&P Social History:  Social History   Substance and Sexual Activity  Alcohol Use Never  . Frequency: Never     Social History   Substance and Sexual Activity  Drug Use No    Social History   Socioeconomic History  . Marital status: Divorced    Spouse name: Not on file  . Number of children: 2  . Years of education: Not on file  . Highest education level: Not on file  Occupational History  . Occupation: nutrition    Employer: [employer]  Social Needs  . Financial resource strain: Not hard at all  . Food insecurity:    Worry: Never true    Inability: Never true  . Transportation needs:    Medical: No    Non-medical: No  Tobacco Use  . Smoking status: Former Smoker    Packs/day: 1.00    Years: 15.00    Pack years: 15.00    Types: Cigarettes    Last attempt to quit: 03/18/2004    Years since quitting: 14.2  . Smokeless tobacco: Never Used  Substance and Sexual Activity  . Alcohol use: Never    Frequency: Never  . Drug use: No  . Sexual activity: Yes  Lifestyle  . Physical activity:    Days per week: 3 days    Minutes per session: 30 min  . Stress: To some extent  Relationships  . Social connections:    Talks on phone: More than three times a week    Gets together: More than three times a week    Attends religious service: Never    Active member of club or organization: No    Attends meetings of clubs or organizations: Never    Relationship status: Divorced  Other Topics Concern   . Not on file  Social History Narrative   Diet: Regular   Caffeine: Yes   Married- divorced, married in 2006   House: Yes, 2 persons   Pets: 1 dog   Current/Past profession: [employer] 2002-2013   Exercise: Yes, walking   Living Will: No    DNR: No    POA/HPOA: No       Additional Social History:    Pain Medications: SEE MAR.  Prescriptions: Pt reports being prescribed Xanax, Trazodone, Prozac and other medications she cannot recall.  Over the Counter: SEE MAR.  History of alcohol / drug use?: No history of alcohol / drug abuse     Sleep: Improving  Appetite:  Fair/improving  Current Medications: Current Facility-Administered Medications  Medication Dose Route Frequency Provider Last Rate Last Dose  . acetaminophen (TYLENOL) tablet 650 mg  650 mg Oral Q6H PRN Rozetta Nunnery,  NP   650 mg at 06/24/18 0642  . ALPRAZolam (XANAX XR) 24 hr tablet 2 mg  2 mg Oral Daily Sharma Covert, MD   2 mg at 06/26/18 1115  . ALPRAZolam Duanne Moron) tablet 1 mg  1 mg Oral BID PRN Sharma Covert, MD   1 mg at 06/25/18 1653  . alum & mag hydroxide-simeth (MAALOX/MYLANTA) 200-200-20 MG/5ML suspension 30 mL  30 mL Oral Q4H PRN Lindon Romp A, NP      . cholecalciferol (VITAMIN D3) tablet 1,000 Units  1,000 Units Oral Daily Sharma Covert, MD   1,000 Units at 06/26/18 1116  . colestipol (COLESTID) tablet 2 g  2 g Oral BID Lindon Romp A, NP   2 g at 06/26/18 1114  . dicyclomine (BENTYL) tablet 20 mg  20 mg Oral TID AC & HS Sharma Covert, MD   20 mg at 06/26/18 1116  . FLUoxetine (PROZAC) capsule 80 mg  80 mg Oral Daily Sharma Covert, MD   80 mg at 06/26/18 1114  . levothyroxine (SYNTHROID, LEVOTHROID) tablet 125 mcg  125 mcg Oral Q0600 Lindon Romp A, NP   125 mcg at 06/26/18 6314  . magnesium hydroxide (MILK OF MAGNESIA) suspension 30 mL  30 mL Oral Daily PRN Lindon Romp A, NP      . metoprolol tartrate (LOPRESSOR) tablet 25 mg  25 mg Oral BID Sharma Covert, MD   25 mg at  06/26/18 1116  . pantoprazole (PROTONIX) EC tablet 40 mg  40 mg Oral Daily Sharma Covert, MD   40 mg at 06/26/18 1117  . traZODone (DESYREL) tablet 200 mg  200 mg Oral QHS PRN Connye Burkitt, NP   200 mg at 06/25/18 2304    Lab Results:  No results found for this or any previous visit (from the past 48 hour(s)).  Blood Alcohol level:  No results found for: [facility]  Metabolic Disorder Labs: Lab Results  Component Value Date   HGBA1C 5.6 06/21/2018   MPG 114.02 06/21/2018   MPG 114 06/13/2017   No results found for: PROLACTIN Lab Results  Component Value Date   CHOL 258 (H) 06/21/2018   TRIG 185 (H) 06/21/2018   HDL 50 06/21/2018   CHOLHDL 5.2 06/21/2018   VLDL 37 06/21/2018   LDLCALC 171 (H) 06/21/2018   LDLCALC 172 (H) 03/13/2018    Physical Findings: AIMS: Facial and Oral Movements Muscles of Facial Expression: None, normal Lips and Perioral Area: None, normal Jaw: None, normal Tongue: None, normal,Extremity Movements Upper (arms, wrists, hands, fingers): None, normal Lower (legs, knees, ankles, toes): None, normal, Trunk Movements Neck, shoulders, hips: None, normal, Overall Severity Severity of abnormal movements (highest score from questions above): None, normal Incapacitation due to abnormal movements: None, normal Patient's awareness of abnormal movements (rate only patient's report): No Awareness, Dental Status Current problems with teeth and/or dentures?: No Does patient usually wear dentures?: No  CIWA:  CIWA-Ar Total: 1 COWS:  COWS Total Score: 1  Musculoskeletal: Strength & Muscle Tone: within normal limits Gait & Station: normal Patient leans: N/A  Psychiatric Specialty Exam: Physical Exam  Nursing note and vitals reviewed. Constitutional: She is oriented to person, place, and time. She appears well-developed and well-nourished.  Cardiovascular: Normal rate.  Respiratory: Effort normal.  Neurological: She is alert and oriented to person, place,  and time.    Review of Systems  Constitutional: Negative.   Respiratory: Negative.   Cardiovascular: Negative.   Psychiatric/Behavioral: Positive  for depression and suicidal ideas. Negative for hallucinations, memory loss and substance abuse. The patient has insomnia. The patient is not nervous/anxious.   No chest pain, no shortness of breath, no vomiting  Blood pressure 105/84, pulse 68, temperature 98.9 F (37.2 C), temperature source Oral, resp. rate 16, height _0  (1.753 m), weight 104.3 kg, last menstrual period 03/19/2011, SpO2 96 %.Body mass index is 33.97 kg/m.  General Appearance: Improving grooming  Eye Contact:  Good  Speech:  Normal Rate  Volume:  Normal  Mood:  Remains depressed  Affect:  Affect becoming more reactive, smiles briefly at times, not tearful today  Thought Process:  Linear and Descriptions of Associations: Intact  Orientation:  Full (Time, Place, and Person)  Thought Content:  No hallucinations, no delusions  Suicidal Thoughts:  Yes.  without intent/plan-at this time denies suicidal plan or intention but acknowledges intermittent suicidal ideations without clear plan/intention  Homicidal Thoughts:  No-as above, denies any homicidal ideations, any violent ideations, and specifically also denies any homicidal or violent ideations towards Police   Memory:  Recent and remote grossly intact  Judgement:  Fair  Insight:  Fair  Psychomotor Activity:  Normal  Concentration:  Concentration: Good and Attention Span: Good  Recall:  Good  Fund of Knowledge:  Good  Language:  Good  Akathisia:  No  Handed:  Right  AIMS (if indicated):     Assets:  Communication Skills Desire for Improvement Housing Resilience Social Support  ADL's:  Intact  Cognition:  WNL  Sleep:  Number of Hours: 5.75   Assessment: 43 year old female, history of depression, PTSD, panic disorder.  Reports severe grief/depression following recent death of her 43 year old son who committed  suicide last week.  Today patient reports depression, persistent grief related to her son's death, still focused on obtaining a copy of the suicide letter that he wrote. ( This letter is currently in the possession of police, and report is that it will not be released at this time as investigation is ongoing.) She presents with some improvement and I notice improved mood, less tearful affect, more interaction with peers, but continues to experience intermittent suicidal ideations and her outpatient therapist has expressed concern regarding ongoing suicidal thoughts.  At this time patient is future oriented and expressing a plan of relocating with her father when she is discharged.  Further long-term residential psychiatric care has been discussed as a possible option but patient has declined.  Denies medication side effects.  Treatment Plan Summary: Daily contact with patient to assess and evaluate symptoms and progress in treatment and Medication management  Treatment plan reviewed as below today 2/7 Encourage group and milieu participation to work on coping skills and symptom reduction Continue 1:1 monitoring for safety/suicide precaution Decrease Trazodone to 100 mg QHSPRN insomnia Continue Prozac 80 mg QDAY for MDD Continue Xanax XR 2 mg QDAY for anxiety Decrease  Xanax to 0.5  mg TID PRN anxiety Continue Lopressor 25 mg BID for HTN Continue Synthroid 125 mcg QDAY for hypothyroidism Continue Protonix 40 mg QDAY for GERD Treatment team working on discharge planning options-see above  Jenne Campus, MD 06/26/2018, 11:30 AM   Patient ID: Andrea [name], female   DOB: Oct 14, 1975, 43 y.o.   MRN: [id number]

## 2018-06-27 NOTE — Progress Notes (Signed)
Pt is resting in bed with eyes closed.  Respirations are even and unlabored.  No distress noted.    1:1 staff remains with pt for safety.  Pt is safe on the unit.  Will continue to monitor and assess.  

## 2018-06-27 NOTE — Progress Notes (Signed)
DAR Note 1:1 Pt alert and is eating lunch at this time. Tearful and frightened after groups this morning, stated to writer "I saw someone outside the window during groups; he looks like [name]'s father, he comes here frequently for meth treatment; I'm scared or maybe I'm hallucinating now". Compliant with noon medications when offered. Continues to ruminate on incidents leading to admission "I found my son dead" as well as her son's life.  Emotional support, encouragement and reassurance offered to pt as needed. Scheduled medications offered per provider's orders. 1:1 observation maintained with assigned staff in attendance at all times. Pt remains safe on unit without self harm gestures to note thus far. POC continues for safety and mood stability.

## 2018-06-27 NOTE — Progress Notes (Signed)
1:1 note:  D: Spent time with patient in dayroom where she was conversing with peers. Patient gave approval for [place] to talk. Patient states, "I have had a roller coaster of a day. My emotions are all over the place. And earlier, I thought I saw my ex-husband up at the nurses' station numerous times. I was in group but I could see through the glass. It was strange. I don't usually see things." Patient's affect animated, mood pleasant. Patient expressing her grief. States she is open to the referral to [facility] but very much wants to go home and pack up her room with her mother and her therapist. "I'll take the referral, but I want to see how I feel at the house. I won't go in his room, but I don't want anyone else packing up my stuff." Patient reports worrying that she won't be able to sleep tonight. "I don't know why my xanax and trazadone were decreased. I woke up a lot last night." Denies physical complaints.   A: Level I obs in place for safety. Emotional support offered. Patient encouraged to complete Suicide Safety Plan when ready before discharge. Encouraged to attend and participate in unit programming.  Fall prevention plan in place and reviewed with patient as pt is a high fall risk.   R: Patient verbalizes understanding of POC, falls prevention education. When asked about SI, patient states, "well, suicidal thoughts are there on a good day. I don't want to kill myself as I can't put my son and dad and the rest of my family through that. But I do want the pain to end. If I have to go back to that house, I will kill myself. My daddy is looking for a new house for [place]." Patient verbally contracts for safety on unit. Denies HI/AH and remains safe on level III obs. Will continue to monitor throughout the night.

## 2018-06-27 NOTE — Progress Notes (Signed)
1:1 DAR Note Pt awake at this time. Calm, remains depressed but has been cooperative with care and compliant with treatment regimen. Tolerates all PO intake well. Took her evening medications without issues. Expressed excitement about family visit this evening.  1:1 observation continues as ordered for safety. Scheduled medications administered as ordered without incident. Assigned staff in attendance at all times.  Pt receptive to care. POC continues for safety and mood stability and pt monitored as such.

## 2018-06-27 NOTE — Progress Notes (Signed)
PRN medication was administered earlier tonight for anxiety and sleep.  Pt questioned why the dose was lower than it was previously and she was informed that provider had decreased the dose.  Since pt has reported feeling "unsteady" multiple times tonight and per report earlier today, it was suggested to pt that this may be why the dose was decreased.  She was encouraged to discuss further with provider tomorrow and she verbalized understanding.  PO fluids encouraged and pt reports she has been drinking plenty of fluids throughout the day.  Pt is safe on the unit.  Staff is monitoring pt 1:1 for safety.  Will continue to monitor and assess.

## 2018-06-27 NOTE — [facility] Group Notes (Signed)
[facility] LCSW Group Therapy Note  Date/Time:    06/27/2018 10:00-11:00AM  Type of Therapy and Topic:  Group Therapy:  Healthy vs Unhealthy Coping Skills  Participation Level:  Minimal   Description of Group:  The focus of this group was to determine what unhealthy coping techniques typically are used by group members and what healthy coping techniques would be helpful in coping with various problems. Patients were guided in becoming aware of the differences between healthy and unhealthy coping techniques.  Patients were asked to identify 1-2 healthy coping skills they would like to learn to use more effectively, and many mentioned meditation, breathing, and relaxation.  These were explained, samples demonstrated, and resources shared for how to learn more at discharge.     Therapeutic Goals 1. Patients learned that coping is what human beings do all day long to deal with various situations in their lives 2. Patients defined and discussed healthy vs unhealthy coping techniques 3. Patients identified their preferred coping techniques and identified whether these were healthy or unhealthy 4. Patients determined 1-2 healthy coping skills they would like to become more familiar with and use more often, and practiced a few meditations 5. Patients provided support and ideas to each other  Summary of Patient Progress: During group, patient expressed that she tends to shove her emotions deep inside, then will explode when she cannot handle them any longer.  She said she does a lot of journaling and that typically is very helpful, but is not helping her in her current situation.   Therapeutic Modalities Cognitive Behavioral Therapy Motivational Interviewing   Selmer Dominion, LCSW 06/27/2018, 11:22 AM

## 2018-06-27 NOTE — BHH Counselor (Signed)
Clinical Social Work Note  Returned phone call to patient's mother Andrea [name] [phone].  She stated that everyone in patient's family is encouraging her to go to the long-term treatment program in [place].  Patient's resistance is based on fear she will be "tricked" into staying there even if she does not like it; however, she did tell family during visitation last night that she would request the referral be made.  Mother and father are divorced, and patient will be informing father about discharge since she will be living with him.  Mother is asking that we also contact her to inform her of patient's discharge when the time comes.  Selmer Dominion, LCSW 06/27/2018, 9:17 AM

## 2018-06-27 NOTE — Progress Notes (Addendum)
D. Pt presents with a flat affect/ depressed mood-friendly upon approach - pt observed sitting in bed eating breakfast with 1:1 sitter in room- Pt complaining of burning upon urination. Pt endorses passive SI-on and off with no plan-agrees to contact staff before acting on any harmful thoughts.  A. Labs and vitals monitored. Pt compliant with medications. Pt supported emotionally and encouraged to express concerns and ask questions.   R. Pt remains safe with 15 minute checks. Will continue POC.

## 2018-06-27 NOTE — [facility] Group Notes (Signed)
Adult Psychoeducational Group Note  Date:  06/27/2018 Time:  9:12 PM  Group Topic/Focus:  Wrap-Up Group:   The focus of this group is to help patients review their daily goal of treatment and discuss progress on daily workbooks.  Participation Level:  Active  Participation Quality:  Appropriate and Attentive  Affect:  Depressed and Flat  Cognitive:  Alert and Appropriate  Insight: Appropriate and Good  Engagement in Group:  Engaged  Modes of Intervention:  Discussion, Education and Support  Additional Comments:  Pt attended and participated in wrap up group this evening. Pt rated their day a -20, due to them hallucinating about seeing their sons father. Pt completed their goal to get out of the room and go to groups.   Andrea [name] 06/27/2018, 9:12 PM

## 2018-06-27 NOTE — Progress Notes (Addendum)
[facility] MD Progress Note  06/27/2018 11:44 AM Andrea [name]  MRN:  [id number]   Subjective: Patient continues to report depression, sadness, acknowledges intermittent suicidal ideations which "come and go", continues to focus on being able to read suicide note left behind by her son and which is currently in possession of police department.    Objective: I have discussed case with treatment team and Dr Parke Poisson.   43 year old female, history of depression, PTSD, panic disorder.  Reports severe grief/depression following recent death of her 71 year old son who committed suicide last week. 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   Today on evaluation:  She presents with sadness and depressed mood. She spoke at length about her son and finding him after he committed suicide. She is focused on obtaining the suicide note he wrote ( as above, it is in the possession of the police during ongoing investigation). She continues to experience intermittent suicidal ideations and stated she keeps looking around to see if she can find something to harm herself with. She remains on 1:1 observation due to her ongoing suicidal thoughts.  She stated she feels like if she were dead she could go find her son and be with him. On the other hand, she  is future oriented and expressing a plan of relocating with her father when she is discharged. She stated her father is out today looking for another place for them to live because she does not want to return to her home. She also stated she will go home and pack up her room with the help of her therapist. She thinks her father is packing up her son's belongings. Further long-term residential psychiatric care has been discussed as a possible option, patient stated she will consider it but wants to go home and pack her belongings first. She stated her family is very supportive and wants her to go to long term  residential treatment.   Denies medication side effects. She stated she is not sleeping well because her Trazodone was decreased to 100 mg and she normally takes 200 mg per night at home. She also stated her PRN Xanax was reduced to 0.5 mg and this is like taking a tic-tac and not helping her.   Principal Problem: Severe recurrent major depression without psychotic features ([place]) Diagnosis: Principal Problem:   Severe recurrent major depression without psychotic features ([place])  Total Time spent with patient: 25 minutes  Past Psychiatric History: See admission H&P  Past Medical History:  Past Medical History:  Diagnosis Date  . Anxiety   . Arthritis    ruptured lumbar disc-careful with positioning  . Blood dyscrasia    protein s deficiency-no treatment since 2006  . Depression   . GERD (gastroesophageal reflux disease)   . Headache(784.0)   . Hemorrhoids   . High cholesterol   . Hyperlipemia   . Hypertension   . Hypothyroidism   . IBS (irritable bowel syndrome)   . MVP (mitral valve prolapse)    echo per dr Dagmar Hait  . Protein S deficiency ([place]) 2003   dvt/pulmonary embolus-on coumadin for 6 months then off-Sees [facility]  . Pulmonary embolism ([place])   . PVC (premature ventricular contraction)     Past Surgical History:  Procedure Laterality Date  . CESAREAN SECTION  2006  . CHOLECYSTECTOMY N/A 11/03/2014   Procedure: LAPAROSCOPIC CHOLECYSTECTOMY WITH INTRAOPERATIVE CHOLANGIOGRAM;  Surgeon: Georganna Skeans, MD;  Location:  [facility] [facility];  Service: General;  Laterality: N/A;  . LAPAROSCOPIC ASSISTED VAGINAL HYSTERECTOMY  03/27/2011   Procedure: LAPAROSCOPIC ASSISTED VAGINAL HYSTERECTOMY;  Surgeon: Cyril Mourning, MD;  Location: [facility];  Service: Gynecology;  Laterality: N/A;  . SALPINGOOPHORECTOMY  03/27/2011   Procedure: SALPINGO OOPHERECTOMY;  Surgeon: Cyril Mourning, MD;  Location: [facility];  Service: Gynecology;  Laterality: Bilateral;  . TONSILLECTOMY  92  . vaginal  reconstructive surgery  2001   Family History:  Family History  Problem Relation Age of Onset  . High blood pressure Mother   . Hyperlipidemia Father        fathers side of family  . High blood pressure Father   . Hypertension Other        entire family on both sides  . Asthma Son   . Asthma Son   . Clotting disorder Maternal Uncle   . Clotting disorder Paternal Grandmother   . Heart disease Maternal Grandfather    Family Psychiatric  History: See admission H&P Social History:  Social History   Substance and Sexual Activity  Alcohol Use Never  . Frequency: Never     Social History   Substance and Sexual Activity  Drug Use No    Social History   Socioeconomic History  . Marital status: Divorced    Spouse name: Not on file  . Number of children: 2  . Years of education: Not on file  . Highest education level: Not on file  Occupational History  . Occupation: nutrition    Employer: [employer]  Social Needs  . Financial resource strain: Not hard at all  . Food insecurity:    Worry: Never true    Inability: Never true  . Transportation needs:    Medical: No    Non-medical: No  Tobacco Use  . Smoking status: Former Smoker    Packs/day: 1.00    Years: 15.00    Pack years: 15.00    Types: Cigarettes    Last attempt to quit: 03/18/2004    Years since quitting: 14.2  . Smokeless tobacco: Never Used  Substance and Sexual Activity  . Alcohol use: Never    Frequency: Never  . Drug use: No  . Sexual activity: Yes  Lifestyle  . Physical activity:    Days per week: 3 days    Minutes per session: 30 min  . Stress: To some extent  Relationships  . Social connections:    Talks on phone: More than three times a week    Gets together: More than three times a week    Attends religious service: Never    Active member of club or organization: No    Attends meetings of clubs or organizations: Never    Relationship status: Divorced  Other Topics Concern   . Not on file  Social History Narrative   Diet: Regular   Caffeine: Yes   Married- divorced, married in 2006   House: Yes, 2 persons   Pets: 1 dog   Current/Past profession: [employer] 2002-2013   Exercise: Yes, walking   Living Will: No    DNR: No    POA/HPOA: No       Additional Social History:    Pain Medications: SEE MAR.  Prescriptions: Pt reports being prescribed Xanax, Trazodone, Prozac and other medications she cannot recall.  Over the Counter: SEE MAR.  History of alcohol / drug use?: No history of alcohol / drug abuse     Sleep:  Improving  Appetite:  Fair/improving  Current Medications: Current Facility-Administered Medications  Medication Dose Route Frequency Provider Last Rate Last Dose  . acetaminophen (TYLENOL) tablet 650 mg  650 mg Oral Q6H PRN Lindon Romp A, NP   650 mg at 06/26/18 1746  . ALPRAZolam (XANAX XR) 24 hr tablet 2 mg  2 mg Oral Daily Sharma Covert, MD   2 mg at 06/27/18 4166  . ALPRAZolam Duanne Moron) tablet 0.5 mg  0.5 mg Oral TID PRN Salvadore Valvano, Myer Peer, MD   0.5 mg at 06/26/18 2214  . alum & mag hydroxide-simeth (MAALOX/MYLANTA) 200-200-20 MG/5ML suspension 30 mL  30 mL Oral Q4H PRN Lindon Romp A, NP      . cholecalciferol (VITAMIN D3) tablet 1,000 Units  1,000 Units Oral Daily Sharma Covert, MD   1,000 Units at 06/27/18 0820  . colestipol (COLESTID) tablet 2 g  2 g Oral BID Lindon Romp A, NP   2 g at 06/27/18 0630  . dicyclomine (BENTYL) tablet 20 mg  20 mg Oral TID AC & HS Sharma Covert, MD   20 mg at 06/27/18 0600  . FLUoxetine (PROZAC) capsule 80 mg  80 mg Oral Daily Sharma Covert, MD   80 mg at 06/27/18 0820  . levothyroxine (SYNTHROID, LEVOTHROID) tablet 125 mcg  125 mcg Oral Q0600 Lindon Romp A, NP   125 mcg at 06/27/18 0600  . magnesium hydroxide (MILK OF MAGNESIA) suspension 30 mL  30 mL Oral Daily PRN Lindon Romp A, NP      . metoprolol tartrate (LOPRESSOR) tablet 25 mg  25 mg Oral BID Sharma Covert, MD   25 mg  at 06/27/18 0820  . oseltamivir (TAMIFLU) capsule 75 mg  75 mg Oral Daily Carlyle Basques, MD   75 mg at 06/27/18 0820  . pantoprazole (PROTONIX) EC tablet 40 mg  40 mg Oral Daily Sharma Covert, MD   40 mg at 06/27/18 0820  . traZODone (DESYREL) tablet 100 mg  100 mg Oral QHS PRN Rory Xiang, Myer Peer, MD   100 mg at 06/26/18 2215    Lab Results:  No results found for this or any previous visit (from the past 48 hour(s)).  Blood Alcohol level:  No results found for: [facility]  Metabolic Disorder Labs: Lab Results  Component Value Date   HGBA1C 5.6 06/21/2018   MPG 114.02 06/21/2018   MPG 114 06/13/2017   No results found for: PROLACTIN Lab Results  Component Value Date   CHOL 258 (H) 06/21/2018   TRIG 185 (H) 06/21/2018   HDL 50 06/21/2018   CHOLHDL 5.2 06/21/2018   VLDL 37 06/21/2018   LDLCALC 171 (H) 06/21/2018   LDLCALC 172 (H) 03/13/2018    Physical Findings: AIMS: Facial and Oral Movements Muscles of Facial Expression: None, normal Lips and Perioral Area: None, normal Jaw: None, normal Tongue: None, normal,Extremity Movements Upper (arms, wrists, hands, fingers): None, normal Lower (legs, knees, ankles, toes): None, normal, Trunk Movements Neck, shoulders, hips: None, normal, Overall Severity Severity of abnormal movements (highest score from questions above): None, normal Incapacitation due to abnormal movements: None, normal Patient's awareness of abnormal movements (rate only patient's report): No Awareness, Dental Status Current problems with teeth and/or dentures?: No Does patient usually wear dentures?: No  CIWA:  CIWA-Ar Total: 1 COWS:  COWS Total Score: 1  Musculoskeletal: Strength & Muscle Tone: within normal limits Gait & Station: normal Patient leans: N/A  Psychiatric Specialty Exam: Physical Exam  Nursing note  and vitals reviewed. Constitutional: She is oriented to person, place, and time. She appears well-developed and well-nourished.   Cardiovascular: Normal rate.  Respiratory: Effort normal.  Neurological: She is alert and oriented to person, place, and time.    Review of Systems  Constitutional: Negative.   Respiratory: Negative.   Cardiovascular: Negative.   Psychiatric/Behavioral: Positive for depression and suicidal ideas. Negative for hallucinations, memory loss and substance abuse. The patient has insomnia. The patient is not nervous/anxious.   No chest pain, no shortness of breath, no vomiting  Blood pressure 130/90, pulse (!) 57, temperature 97.8 F (36.6 C), temperature source Oral, resp. rate 18, height 5\' 9"  (1.753 m), weight 104.3 kg, last menstrual period 03/19/2011, SpO2 96 %.Body mass index is 33.97 kg/m.  General Appearance: Casual and Fairly Groomed  Eye Contact:  Good  Speech:  Normal Rate  Volume:  Normal  Mood:  Remains depressed  Affect:  Affect becoming more reactive, smiles briefly at times, not tearful today  Thought Process:  Linear and Descriptions of Associations: Intact, forward thinking  Orientation:  Full (Time, Place, and Person)  Thought Content:  No hallucinations, no delusions  Suicidal Thoughts:  Yes.  without intent/plan-at this time denies suicidal plan or intention but acknowledges intermittent suicidal ideations without clear plan/intention.  Homicidal Thoughts:  No-as above, denies any homicidal ideations, any violent ideations, and specifically also denies any homicidal or violent ideations towards Police   Memory:  Recent and remote grossly intact  Judgement:  Fair  Insight:  Fair  Psychomotor Activity:  Normal  Concentration:  Concentration: Good and Attention Span: Good  Recall:  Good  Fund of Knowledge:  Good  Language:  Good  Akathisia:  No  Handed:  Right  AIMS (if indicated):     Assets:  Communication Skills Desire for Improvement Housing Resilience Social Support  ADL's:  Intact  Cognition:  WNL  Sleep:  Number of Hours: 4.5   Assessment: 43 year old  female, history of depression, PTSD, panic disorder.  Reports severe grief/depression following recent death of her 87 year old son who committed suicide last week.   Treatment Plan Summary: Daily contact with patient to assess and evaluate symptoms and progress in treatment and Medication management   Treatment plan reviewed as below today 2/8 Encourage group and milieu participation to work on coping skills and symptom reduction Continue 1:1 monitoring for safety/suicide precaution Decrease Trazodone to 100 mg QHSPRN insomnia Continue Prozac 80 mg QDAY for MDD Continue Xanax XR 2 mg QDAY for anxiety Decrease  Xanax to 0.5  mg TID PRN anxiety Continue Lopressor 25 mg BID for HTN Continue Synthroid 125 mcg QDAY for hypothyroidism Continue Protonix 40 mg QDAY for GERD Treatment team working on discharge planning options-see above  Ethelene Hal, NP 06/27/2018, 11:44 AM  ..Agree with NP Progress Note

## 2018-06-28 LAB — URINALYSIS, ROUTINE W REFLEX MICROSCOPIC
Bacteria, UA: NONE SEEN
Bilirubin Urine: NEGATIVE
Glucose, UA: NEGATIVE mg/dL
Hgb urine dipstick: NEGATIVE
Ketones, ur: NEGATIVE mg/dL
Nitrite: NEGATIVE
Protein, ur: NEGATIVE mg/dL
Specific Gravity, Urine: 1.011 (ref 1.005–1.030)
pH: 5 (ref 5.0–8.0)

## 2018-06-28 MED ORDER — TRAZODONE HCL 150 MG PO TABS
150.0000 mg | ORAL_TABLET | Freq: Every evening | ORAL | Status: DC | PRN
  Administered 2018-06-28 – 2018-06-29 (×2): 150 mg via ORAL
  Filled 2018-06-28 (×2): qty 1

## 2018-06-28 NOTE — Progress Notes (Signed)
1:1 note:  Patient reported earlier tonight that she did not sleep well last night. Upset that xanax and trazadone have been decreased. Encouraged patient to ask provider for clarification. Also stated patient needs to be alert and not overmedicated so that she can focus on treatment and beginning to process her grief. Patient took available prns - xanax 0.5mg  and trazadone 100mg  - though stated, 'this won't be enough." On reassessment at 2400, patient was asleep. She continues to rest without difficulty with 1:1 staff at bedside. NAD, no complaints. RR WNL, even and unlabored. Will continue 1:1 obs for safety.

## 2018-06-28 NOTE — BHH Counselor (Signed)
Clinical Social Work Note  Met with pt's outpatient counselor Debria Garret after they visited.  Counselor is concerned that pt is pretending to be okay and is not allowing herself to actually feel her grief.  She has been pt's therapist for 6 years and has 2-hour sessions because that is how long it takes for her to actually start to be truthful and open and not behind a little girl persona.  Counselor stated that "if she discharged today, I have no doubt she would go straight home and kill herself within 10 minutes."    She said that pt is now agreeable to go to [facility]; however, she wants to go home first to pack her belongings to "see if I can handle it."  Counselor is asking to be contacted when discharge starts to be discussed, [phone].  Selmer Dominion, LCSW 06/28/2018, 5:15 PM

## 2018-06-28 NOTE — Progress Notes (Signed)
RN 1:1 Note  D: Pt is ambulating in her room and engaging with her sitter. No signs of discomfort or distress noted.  A: 1:1 continues for pt's safety.  R: Pt remains safe on unit.

## 2018-06-28 NOTE — [facility] Group Notes (Signed)
[facility] Group Notes: (Clinical Social Work)   06/28/2018      Type of Therapy:  Group Therapy   Participation Level:  Did Not Attend despite MHT prompting   Jamond Neels Grossman-Orr, LCSW 06/28/2018, 12:28 PM     

## 2018-06-28 NOTE — Plan of Care (Signed)
  Problem: Coping: Goal: Coping ability will improve Outcome: Progressing   D: Pt alert and oriented on the unit. Pt engaging with RN staff and her 1:1 sitter. Pt denies endorses passive SI, and denies HI and A/VH. Pt stated, "My Prozac helps me to not cry as much. I still have my moments but it helps control it." Pt is pleasant and cooperative. A: Education, support and encouragement provided, q15 minute safety checks remain in effect. Medications administered per MD orders. R: No reactions/side effects to medicine noted. Pt denies any concerns at this time. Pt ambulating on the unit with no issues. Pt remains safe on the unit.

## 2018-06-28 NOTE — Progress Notes (Signed)
[facility] MD Progress Note  06/28/2018 4:01 PM Andrea [name]  MRN:  [id number]   Subjective: Patient states "I think I am a little better " " I think I am accepting reality and that he really is gone".  She continues to focus on being able to obtain suicide letters written by his son which are currently with police, but states she realizes" even if he was angry at me, or blames me, I know I was a good mother to him".  Presents future oriented and states that her therapist and a friend have agreed to go to her home when she is discharged in order to be with her while she packs her belongings and moves in with her father.  States "I cannot live there anymore, last time I was there I hallucinated and saw him". Today focuses on funeral ceremony,which she states helped her because her son seemed to be at peace,  and states that "I feel good being able to talk about it with somebody".  Currently denies medication side effects.  Complains of insomnia, following trazodone dose decrease.  Objective: Chart reviewed, patient seen. 43 year old female, history of depression, PTSD, panic disorder. Reports severe grief/depression following recent death of her 65 year old son who committed suicide last week. Patient reports some improvement compared to admission, states she feels she has moved out of the stage of "denial" and is now more accepting of her son's death.  She continues to ruminate about his death/loss but noticed to speak more about good times she and her son had together.  Seems less significantly focused on obtaining copies of suicide letter, and is noted to be  future oriented, with more emphasis on disposition planning, as above. She does continue to describe intermittent suicidal ideations and states that yesterday she noticed she was scanning her room trying to find some object to cut herself.  States that she has had suicidal ideations intermittently for years, even before her son's passing.   Denies medication side effects, states "I am not sleeping well",  "I need more trazodone" No disruptive or agitated behaviors on unit, going to some groups, pleasant/cooperative on approach.   Principal Problem: Severe recurrent major depression without psychotic features ([place]) Diagnosis: Principal Problem:   Severe recurrent major depression without psychotic features ([place])  Total Time spent with patient: 25 minutes  Past Psychiatric History: See admission H&P  Past Medical History:  Past Medical History:  Diagnosis Date  . Anxiety   . Arthritis    ruptured lumbar disc-careful with positioning  . Blood dyscrasia    protein s deficiency-no treatment since 2006  . Depression   . GERD (gastroesophageal reflux disease)   . Headache(784.0)   . Hemorrhoids   . High cholesterol   . Hyperlipemia   . Hypertension   . Hypothyroidism   . IBS (irritable bowel syndrome)   . MVP (mitral valve prolapse)    echo per dr Dagmar Hait  . Protein S deficiency ([place]) 2003   dvt/pulmonary embolus-on coumadin for 6 months then off-Sees [facility]  . Pulmonary embolism ([place])   . PVC (premature ventricular contraction)     Past Surgical History:  Procedure Laterality Date  . CESAREAN SECTION  2006  . CHOLECYSTECTOMY N/A 11/03/2014   Procedure: LAPAROSCOPIC CHOLECYSTECTOMY WITH INTRAOPERATIVE CHOLANGIOGRAM;  Surgeon: Georganna Skeans, MD;  Location: [facility];  Service: General;  Laterality: N/A;  . LAPAROSCOPIC ASSISTED VAGINAL HYSTERECTOMY  03/27/2011   Procedure: LAPAROSCOPIC ASSISTED VAGINAL HYSTERECTOMY;  Surgeon: Cyril Mourning, MD;  Location: [facility];  Service: Gynecology;  Laterality: N/A;  . SALPINGOOPHORECTOMY  03/27/2011   Procedure: SALPINGO OOPHERECTOMY;  Surgeon: Cyril Mourning, MD;  Location: [facility];  Service: Gynecology;  Laterality: Bilateral;  . TONSILLECTOMY  92  . vaginal reconstructive surgery  2001   Family History:  Family History  Problem Relation Age of Onset  . High  blood pressure Mother   . Hyperlipidemia Father        fathers side of family  . High blood pressure Father   . Hypertension Other        entire family on both sides  . Asthma Son   . Asthma Son   . Clotting disorder Maternal Uncle   . Clotting disorder Paternal Grandmother   . Heart disease Maternal Grandfather    Family Psychiatric  History: See admission H&P Social History:  Social History   Substance and Sexual Activity  Alcohol Use Never  . Frequency: Never     Social History   Substance and Sexual Activity  Drug Use No    Social History   Socioeconomic History  . Marital status: Divorced    Spouse name: Not on file  . Number of children: 2  . Years of education: Not on file  . Highest education level: Not on file  Occupational History  . Occupation: nutrition    Employer: [employer]  Social Needs  . Financial resource strain: Not hard at all  . Food insecurity:    Worry: Never true    Inability: Never true  . Transportation needs:    Medical: No    Non-medical: No  Tobacco Use  . Smoking status: Former Smoker    Packs/day: 1.00    Years: 15.00    Pack years: 15.00    Types: Cigarettes    Last attempt to quit: 03/18/2004    Years since quitting: 14.2  . Smokeless tobacco: Never Used  Substance and Sexual Activity  . Alcohol use: Never    Frequency: Never  . Drug use: No  . Sexual activity: Yes  Lifestyle  . Physical activity:    Days per week: 3 days    Minutes per session: 30 min  . Stress: To some extent  Relationships  . Social connections:    Talks on phone: More than three times a week    Gets together: More than three times a week    Attends religious service: Never    Active member of club or organization: No    Attends meetings of clubs or organizations: Never    Relationship status: Divorced  Other Topics Concern  . Not on file  Social History Narrative   Diet: Regular   Caffeine: Yes   Married- divorced, married  in 2006   House: Yes, 2 persons   Pets: 1 dog   Current/Past profession: [employer] 2002-2013   Exercise: Yes, walking   Living Will: No    DNR: No    POA/HPOA: No       Additional Social History:    Pain Medications: SEE MAR.  Prescriptions: Pt reports being prescribed Xanax, Trazodone, Prozac and other medications she cannot recall.  Over the Counter: SEE MAR.  History of alcohol / drug use?: No history of alcohol / drug abuse     Sleep: Fair  Appetite:  Fair/improving  Current Medications: Current Facility-Administered Medications  Medication Dose Route Frequency Provider Last Rate Last Dose  . acetaminophen (TYLENOL) tablet 650 mg  650  mg Oral Q6H PRN Lindon Romp A, NP   650 mg at 06/26/18 1746  . ALPRAZolam (XANAX XR) 24 hr tablet 2 mg  2 mg Oral Daily Sharma Covert, MD   2 mg at 06/28/18 0539  . ALPRAZolam Duanne Moron) tablet 0.5 mg  0.5 mg Oral TID PRN Kiyan Burmester, Myer Peer, MD   0.5 mg at 06/27/18 2304  . alum & mag hydroxide-simeth (MAALOX/MYLANTA) 200-200-20 MG/5ML suspension 30 mL  30 mL Oral Q4H PRN Lindon Romp A, NP      . cholecalciferol (VITAMIN D3) tablet 1,000 Units  1,000 Units Oral Daily Sharma Covert, MD   1,000 Units at 06/28/18 [phone]  . colestipol (COLESTID) tablet 2 g  2 g Oral BID Lindon Romp A, NP   2 g at 06/28/18 4193  . dicyclomine (BENTYL) tablet 20 mg  20 mg Oral TID AC & HS Sharma Covert, MD   20 mg at 06/28/18 1300  . FLUoxetine (PROZAC) capsule 80 mg  80 mg Oral Daily Sharma Covert, MD   80 mg at 06/28/18 7902  . levothyroxine (SYNTHROID, LEVOTHROID) tablet 125 mcg  125 mcg Oral Q0600 Lindon Romp A, NP   125 mcg at 06/28/18 0647  . magnesium hydroxide (MILK OF MAGNESIA) suspension 30 mL  30 mL Oral Daily PRN Lindon Romp A, NP      . metoprolol tartrate (LOPRESSOR) tablet 25 mg  25 mg Oral BID Sharma Covert, MD   25 mg at 06/28/18 4097  . oseltamivir (TAMIFLU) capsule 75 mg  75 mg Oral Daily Carlyle Basques, MD   75 mg at  06/28/18 3532  . pantoprazole (PROTONIX) EC tablet 40 mg  40 mg Oral Daily Sharma Covert, MD   40 mg at 06/28/18 9924  . traZODone (DESYREL) tablet 100 mg  100 mg Oral QHS PRN Tabbatha Bordelon, Myer Peer, MD   100 mg at 06/27/18 2304    Lab Results:  Results for orders placed or performed during the hospital encounter of 06/20/18 (from the past 48 hour(s))  Urinalysis, Routine w reflex microscopic     Status: Abnormal   Collection Time: 06/27/18 11:48 AM  Result Value Ref Range   Color, Urine YELLOW YELLOW   APPearance CLEAR CLEAR   Specific Gravity, Urine 1.011 1.005 - 1.030   pH 5.0 5.0 - 8.0   Glucose, UA NEGATIVE NEGATIVE mg/dL   Hgb urine dipstick NEGATIVE NEGATIVE   Bilirubin Urine NEGATIVE NEGATIVE   Ketones, ur NEGATIVE NEGATIVE mg/dL   Protein, ur NEGATIVE NEGATIVE mg/dL   Nitrite NEGATIVE NEGATIVE   Leukocytes, UA TRACE (A) NEGATIVE   RBC / HPF 0-5 0 - 5 RBC/hpf   WBC, UA 0-5 0 - 5 WBC/hpf   Bacteria, UA NONE SEEN NONE SEEN   Squamous Epithelial / LPF 0-5 0 - 5   Mucus PRESENT     Comment: Performed at [facility] [place] [address] [place],  [address]    Blood Alcohol level:  No results found for: [facility]  Metabolic Disorder Labs: Lab Results  Component Value Date   HGBA1C 5.6 06/21/2018   MPG 114.02 06/21/2018   MPG 114 06/13/2017   No results found for: PROLACTIN Lab Results  Component Value Date   CHOL 258 (H) 06/21/2018   TRIG 185 (H) 06/21/2018   HDL 50 06/21/2018   CHOLHDL 5.2 06/21/2018   VLDL 37 06/21/2018   LDLCALC 171 (H) 06/21/2018   LDLCALC 172 (H) 03/13/2018  Physical Findings: AIMS: Facial and Oral Movements Muscles of Facial Expression: None, normal Lips and Perioral Area: None, normal Jaw: None, normal Tongue: None, normal,Extremity Movements Upper (arms, wrists, hands, fingers): None, normal Lower (legs, knees, ankles, toes): None, normal, Trunk Movements Neck, shoulders, hips: None, normal, Overall  Severity Severity of abnormal movements (highest score from questions above): None, normal Incapacitation due to abnormal movements: None, normal Patient's awareness of abnormal movements (rate only patient's report): No Awareness, Dental Status Current problems with teeth and/or dentures?: No Does patient usually wear dentures?: No  CIWA:  CIWA-Ar Total: 1 COWS:  COWS Total Score: 1  Musculoskeletal: Strength & Muscle Tone: within normal limits Gait & Station: normal Patient leans: N/A  Psychiatric Specialty Exam: Physical Exam  Nursing note and vitals reviewed. Constitutional: She is oriented to person, place, and time. She appears well-developed and well-nourished.  Cardiovascular: Normal rate.  Respiratory: Effort normal.  Neurological: She is alert and oriented to person, place, and time.    Review of Systems  Constitutional: Negative.   Respiratory: Negative.   Cardiovascular: Negative.   Psychiatric/Behavioral: Positive for depression and suicidal ideas. Negative for hallucinations, memory loss and substance abuse. The patient has insomnia. The patient is not nervous/anxious.   No chest pain, no shortness of breath, no vomiting  Blood pressure 103/85, pulse 74, temperature (!) 97.4 F (36.3 C), temperature source Oral, resp. rate 18, height 5\' 9"  (1.753 m), weight 104.3 kg, last menstrual period 03/19/2011, SpO2 96 %.Body mass index is 33.97 kg/m.  General Appearance: Improving grooming  Eye Contact:  Good  Speech:  Normal Rate  Volume:  Normal  Mood:  Describes some improvement, presents less severely depressed  Affect:  Reactive, smiles at times appropriately  Thought Process:  Linear and Descriptions of Associations: Intact, forward thinking  Orientation:  Full (Time, Place, and Person)  Thought Content:  No hallucinations, no delusions  Suicidal Thoughts:  Yes.  without intent/plan-reports intermittent suicidal ideations and yesterday having thoughts of cutting  self.   Homicidal Thoughts:  No-denies any homicidal or violent ideations towards anybody, also specifically denies any violent or homicidal ideations towards police ( had expressed anger that they have not released letters)   Memory:  Recent and remote grossly intact  Judgement:  Fair/improving  Insight:  Fair/improving  Psychomotor Activity:  Normal  Concentration:  Concentration: Good and Attention Span: Good  Recall:  Good  Fund of Knowledge:  Good  Language:  Good  Akathisia:  No  Handed:  Right  AIMS (if indicated):     Assets:  Communication Skills Desire for Improvement Housing Resilience Social Support  ADL's:  Intact  Cognition:  WNL  Sleep:  Number of Hours: 5.5   Assessment: 43 year old female, history of depression, PTSD, panic disorder.  Reports severe grief/depression following recent death of her 3 year old son who committed suicide last week.  Patient presents with improving mood and range of affect.  States she feels she is progressing in her grief/loss process and is now more accepting of his loss and feeling more ready to "move on".  She does continue to focus and ruminate on his death and and today spoke about how she felt during funeral services.  She seems less focused on reading suicidal letter, although still states "I do not think I will be able to really move on until I read them".  Tolerating medications well, reports insomnia at current  Trazodone dosage.    Treatment Plan Summary: Daily contact with patient to assess  and evaluate symptoms and progress in treatment and Medication management  Treatment plan reviewed as below today 2/9 Encourage group and milieu participation to work on coping skills and symptom reduction Continue 1:1 monitoring for safety/suicide precaution Increase  Trazodone to 150 mg QHS PRN insomnia Continue Prozac 80 mg QDAY for MDD Continue Xanax XR 2 mg QDAY for anxiety Decrease  Xanax to 0.5  mg TID PRN anxiety Continue  Lopressor 25 mg BID for HTN Continue Synthroid 125 mcg QDAY for hypothyroidism Continue Protonix 40 mg QDAY for GERD Treatment team working on discharge planning options-see above  Jenne Campus, MD 06/28/2018, 4:01 PM   Patient ID: [name], female   DOB: 02/02/1976, 43 y.o.   MRN: [id number]

## 2018-06-28 NOTE — Progress Notes (Signed)
1:1 note:  Patient resting on R side. NAD. RR WNL, even and unlabored. Level I obs in place for safety and patient is safe.

## 2018-06-28 NOTE — Progress Notes (Signed)
Adult Psychoeducational Group Note  Date:  06/28/2018 Time:  8:49 PM  Group Topic/Focus:  Wrap-Up Group:   The focus of this group is to help patients review their daily goal of treatment and discuss progress on daily workbooks.  Participation Level:  Active  Participation Quality:  Appropriate  Affect:  Appropriate  Cognitive:  Appropriate  Insight: Appropriate  Engagement in Group:  Engaged  Modes of Intervention:  Discussion  Additional Comments:  Patient attended group and participated.  Shaunte Tuft W Earlie Arciga 12/19/1570, 8:49 PM

## 2018-06-28 NOTE — Progress Notes (Signed)
RN 1:1 Note  D: Pt is engaging with her sitter in her room while laying on her bed. No signs of discomfort or distress noted.  A: 1:1 continues for pt's safety.  R: Pt remains safe on unit.

## 2018-06-28 NOTE — Progress Notes (Signed)
RN 1:1 Note  D: Pt is ambulating in her room and at the nurses' station with sitter. Pt is talkative and writing in her journal. No signs of discomfort or distress noted. A; 1:1 continues for pt's safety. R: Pt remains safe on unit.

## 2018-06-28 NOTE — Progress Notes (Addendum)
  Pt's son Darnelle Maffucci) visited pt this evening and asked to speak with this RN about his mother. RN made pt's son aware that he was not on pt's consent form and this RN could not discuss any of his mother's treatment or medications with him. Darnelle Maffucci understood but wanted to let the RN and MD know that, "My mom is totally opposite than the way she normally is. I don't know what's wrong with her. She hated my brother and they fought all the time, and now she's acting all innocent." Pt's son Darnelle Maffucci) also stated that, "She keeps saying my brother was bullied in school but she home-schooled him and never let him be around people, and he started getting really depressed. I think she had separation anxiety. She keeps talking about going home to cuddle with her father and that sounds really weird to me. She also made a comment about my zipper and it made me feel uncomfortable."

## 2018-06-29 MED ORDER — ALPRAZOLAM 0.5 MG PO TABS
0.5000 mg | ORAL_TABLET | Freq: Two times a day (BID) | ORAL | Status: DC | PRN
  Administered 2018-06-29 – 2018-07-02 (×4): 0.5 mg via ORAL
  Filled 2018-06-29 (×4): qty 1

## 2018-06-29 NOTE — Progress Notes (Signed)
1:1 Note 0730  1:1 continues per MD orders.  Patient denied SI and HI, contracts for safety.  Denied A/V hallucinations.  Patient stated she did not wake up wanting to die this morning.  That she continues to see her son as she found him that last day   Respirations even and unlabored.  No signs/symptoms of pain/distress noted on patient's face/body movements.

## 2018-06-29 NOTE — Progress Notes (Signed)
1:1 Note 1030  Patient has been sitting on her bed, talking to 1:1.  Patient ate 90% breakfast.  Patient denied SI and HI, contracts for safety.  Denied A/V hallucinations.  Denied pain.  Patient stated she did not wake up wanting to hurt herself this morning.  But that feeling may not last.  Patient plans to attend groups and talk to staff throughout the day.  Safety maintained with 1:1 per MD order.

## 2018-06-29 NOTE — Progress Notes (Signed)
Patient ID: Andrea [name], female   DOB: 07-31-1975, 43 y.o.   MRN: [id number] 1:1 Note  Pt at this time is in bed resting with eyes closed. Pt does not look to be in any acute distress. 1:1 staff is present in room with Pt at this time. Pt at assessment endorsed severe anger and feeling of helplessness, "my son wrote a suicide note before he killed himself and the police would not let me have the note; I just want to know why" Pt endorsed severe depression. Pt however, denied SI, "I don't feel suicidal at this moment but I know that can change at any time." Pt contracts for safety. 1:1 monitoring continues for Pt's safety. 15-minute safety checks also continue at this time.

## 2018-06-29 NOTE — Progress Notes (Signed)
1:1 Note 1325  Patient's self inventory sheet, patient has fair sleep, sleep medication helpful.  Good appetite, low energy level, good concentration.  Rated depression 6, hopeless and anxiety 2.  Denied withdrawals.  SI, "I have suicidal ideation constantly, anyway but I decided I want to live.  No severe pain just comes and goes, I feel numb still.  Bladder and urethra, worst pain #5, no pain medicine.  Keep reminding myself that my son was deceased.  I keep thinking he is at home waiting for me to come home.  Try to stay in the reality.  Talk to the SW about [facility].  I am still remembering and having flashbacks of when I found him dead."   No discharge plans.

## 2018-06-29 NOTE — Progress Notes (Signed)
Recreation Therapy Notes  Date:  2.10.20 Time: 0930 Location: [address] Dayroom  Group Topic: Stress Management  Goal Area(s) Addresses:  Patient will identify positive stress management techniques. Patient will identify benefits of using stress management post d/c.  Intervention: Stress Management  Activity :  Meditation.  LRT introduced the stress management technique of meditation.  LRT played a meditation that focused on being resilient in the face of adversity.  Patients were to listen as meditation played to engage in activity.    Education:  Stress Management, Discharge Planning.   Education Outcome: Acknowledges Education  Clinical Observations/Feedback:  Pt did not attend group.    Victorino Sparrow, LRT/CTRS         Victorino Sparrow A 06/29/2018 12:21 PM

## 2018-06-29 NOTE — Progress Notes (Addendum)
1:1 Note 5009  Patient has been standing at the nurse's station with 1:1 for a few minutes.  Stated her son did not have to leave me like that.  I see his face in the mirror.  It is flashbacks not hallucinations.  [name] who is my therapist's back up came to talk to her and she helped patient very much.  [name]'s MD came to see her a few days ago and talked to her.  MD said there was no indication that her son was going to commit suicide.  Patient cannot figure this out.  Respirations even and unlabored.  No signs/symptoms of pain/distress noted on patient's face/body movements.  1:1 continues per MD orders.

## 2018-06-29 NOTE — Plan of Care (Signed)
Nurse discussed anxiety, depression and coping skills with patient.  

## 2018-06-29 NOTE — Progress Notes (Addendum)
[name] MD Progress Note  06/29/2018 5:38 PM Andrea [name]  MRN:  [id number]   Subjective: Patient reports partial improvement, but states "I still feel my moods are like a roller coaster, sometimes I feel really really depressed, sometimes really really angry".  At this time states that she is feeling primarily angry, mainly due to not having the suicide letters that her son wrote.  States " I just do not think I can move on before I read what he wrote".  States" I do not care if he blames me, if he hates me, but I just need to know what he was thinking" . Denies medication side effects.   Objective: Chart reviewed, patient seen. 43 year old female, history of depression, PTSD, panic disorder. Reports severe grief/depression following recent death of her 21 year old son who committed suicide last week. Patient presenting with improving range of affect and noted to be conversant with sitter, other staff, more communicative with peers.  Noted to laugh briefly at times, stating she has had some fun today trying to remember a song's name corresponding to a  tune she has been humming today. As above, states that her mood remains labile and that her affect ranges from sad to angry, although does endorse improvement compared to how she felt at admission and in particular states she feels she is now more accepting and no longer in "denial" regarding her son's death.  She reports intermittent but ongoing suicidal ideations, today does not endorse any actual plans or intentions, and is noted to remain future oriented, with a plan to move in with her father following discharge. Denies medication side effects.  As noted, her major focus today is been able to read her son's suicide letter before she is discharged from our unit.  States "I just need to know what he wrote I can handle what ever it is".  Letter is in custody of police, and the report is that it is part of investigation and has not been released  yet.  No disruptive or agitated behaviors on unit.  Principal Problem: Severe recurrent major depression without psychotic features ([place]) Diagnosis: Principal Problem:   Severe recurrent major depression without psychotic features ([place])  Total Time spent with patient: 20 minutes  Past Psychiatric History: See admission H&P  Past Medical History:  Past Medical History:  Diagnosis Date  . Anxiety   . Arthritis    ruptured lumbar disc-careful with positioning  . Blood dyscrasia    protein s deficiency-no treatment since 2006  . Depression   . GERD (gastroesophageal reflux disease)   . Headache(784.0)   . Hemorrhoids   . High cholesterol   . Hyperlipemia   . Hypertension   . Hypothyroidism   . IBS (irritable bowel syndrome)   . MVP (mitral valve prolapse)    echo per dr Dagmar Hait  . Protein S deficiency ([place]) 2003   dvt/pulmonary embolus-on coumadin for 6 months then off-Sees [facility]  . Pulmonary embolism ([place])   . PVC (premature ventricular contraction)     Past Surgical History:  Procedure Laterality Date  . CESAREAN SECTION  2006  . CHOLECYSTECTOMY N/A 11/03/2014   Procedure: LAPAROSCOPIC CHOLECYSTECTOMY WITH INTRAOPERATIVE CHOLANGIOGRAM;  Surgeon: Georganna Skeans, MD;  Location: [facility];  Service: General;  Laterality: N/A;  . LAPAROSCOPIC ASSISTED VAGINAL HYSTERECTOMY  03/27/2011   Procedure: LAPAROSCOPIC ASSISTED VAGINAL HYSTERECTOMY;  Surgeon: Cyril Mourning, MD;  Location: [facility];  Service: Gynecology;  Laterality: N/A;  . SALPINGOOPHORECTOMY  03/27/2011  Procedure: SALPINGO OOPHERECTOMY;  Surgeon: Cyril Mourning, MD;  Location: [facility];  Service: Gynecology;  Laterality: Bilateral;  . TONSILLECTOMY  92  . vaginal reconstructive surgery  2001   Family History:  Family History  Problem Relation Age of Onset  . High blood pressure Mother   . Hyperlipidemia Father        fathers side of family  . High blood pressure Father   . Hypertension Other         entire family on both sides  . Asthma Son   . Asthma Son   . Clotting disorder Maternal Uncle   . Clotting disorder Paternal Grandmother   . Heart disease Maternal Grandfather    Family Psychiatric  History: See admission H&P Social History:  Social History   Substance and Sexual Activity  Alcohol Use Never  . Frequency: Never     Social History   Substance and Sexual Activity  Drug Use No    Social History   Socioeconomic History  . Marital status: Divorced    Spouse name: Not on file  . Number of children: 2  . Years of education: Not on file  . Highest education level: Not on file  Occupational History  . Occupation: nutrition    Employer: [employer]  Social Needs  . Financial resource strain: Not hard at all  . Food insecurity:    Worry: Never true    Inability: Never true  . Transportation needs:    Medical: No    Non-medical: No  Tobacco Use  . Smoking status: Former Smoker    Packs/day: 1.00    Years: 15.00    Pack years: 15.00    Types: Cigarettes    Last attempt to quit: 03/18/2004    Years since quitting: 14.2  . Smokeless tobacco: Never Used  Substance and Sexual Activity  . Alcohol use: Never    Frequency: Never  . Drug use: No  . Sexual activity: Yes  Lifestyle  . Physical activity:    Days per week: 3 days    Minutes per session: 30 min  . Stress: To some extent  Relationships  . Social connections:    Talks on phone: More than three times a week    Gets together: More than three times a week    Attends religious service: Never    Active member of club or organization: No    Attends meetings of clubs or organizations: Never    Relationship status: Divorced  Other Topics Concern  . Not on file  Social History Narrative   Diet: Regular   Caffeine: Yes   Married- divorced, married in 2006   House: Yes, 2 persons   Pets: 1 dog   Current/Past profession: [employer] 2002-2013   Exercise: Yes, walking   Living Will: No     DNR: No    POA/HPOA: No       Additional Social History:    Pain Medications: SEE MAR.  Prescriptions: Pt reports being prescribed Xanax, Trazodone, Prozac and other medications she cannot recall.  Over the Counter: SEE MAR.  History of alcohol / drug use?: No history of alcohol / drug abuse     Sleep: Improving  Appetite:  Fair/improving  Current Medications: Current Facility-Administered Medications  Medication Dose Route Frequency Provider Last Rate Last Dose  . acetaminophen (TYLENOL) tablet 650 mg  650 mg Oral Q6H PRN Lindon Romp A, NP   650 mg at 06/26/18 1746  .  ALPRAZolam (XANAX XR) 24 hr tablet 2 mg  2 mg Oral Daily Sharma Covert, MD   2 mg at 06/29/18 0831  . ALPRAZolam Duanne Moron) tablet 0.5 mg  0.5 mg Oral TID PRN Cobos, Myer Peer, MD   0.5 mg at 06/28/18 2104  . alum & mag hydroxide-simeth (MAALOX/MYLANTA) 200-200-20 MG/5ML suspension 30 mL  30 mL Oral Q4H PRN Lindon Romp A, NP      . cholecalciferol (VITAMIN D3) tablet 1,000 Units  1,000 Units Oral Daily Sharma Covert, MD   1,000 Units at 06/29/18 0831  . colestipol (COLESTID) tablet 2 g  2 g Oral BID Lindon Romp A, NP   2 g at 06/29/18 1705  . dicyclomine (BENTYL) tablet 20 mg  20 mg Oral TID AC & HS Sharma Covert, MD   20 mg at 06/29/18 1705  . FLUoxetine (PROZAC) capsule 80 mg  80 mg Oral Daily Sharma Covert, MD   80 mg at 06/29/18 0830  . levothyroxine (SYNTHROID, LEVOTHROID) tablet 125 mcg  125 mcg Oral Q0600 Lindon Romp A, NP   125 mcg at 06/29/18 [phone]  . magnesium hydroxide (MILK OF MAGNESIA) suspension 30 mL  30 mL Oral Daily PRN Lindon Romp A, NP      . metoprolol tartrate (LOPRESSOR) tablet 25 mg  25 mg Oral BID Sharma Covert, MD   25 mg at 06/29/18 1706  . oseltamivir (TAMIFLU) capsule 75 mg  75 mg Oral Daily Carlyle Basques, MD   75 mg at 06/29/18 0830  . pantoprazole (PROTONIX) EC tablet 40 mg  40 mg Oral Daily Sharma Covert, MD   40 mg at 06/29/18 0830  . traZODone  (DESYREL) tablet 150 mg  150 mg Oral QHS PRN Cobos, Myer Peer, MD   150 mg at 06/28/18 2104    Lab Results:  No results found for this or any previous visit (from the past 25 hour(s)).  Blood Alcohol level:  No results found for: [employer]  Metabolic Disorder Labs: Lab Results  Component Value Date   HGBA1C 5.6 06/21/2018   MPG 114.02 06/21/2018   MPG 114 06/13/2017   No results found for: PROLACTIN Lab Results  Component Value Date   CHOL 258 (H) 06/21/2018   TRIG 185 (H) 06/21/2018   HDL 50 06/21/2018   CHOLHDL 5.2 06/21/2018   VLDL 37 06/21/2018   LDLCALC 171 (H) 06/21/2018   LDLCALC 172 (H) 03/13/2018    Physical Findings: AIMS: Facial and Oral Movements Muscles of Facial Expression: None, normal Lips and Perioral Area: None, normal Jaw: None, normal Tongue: None, normal,Extremity Movements Upper (arms, wrists, hands, fingers): None, normal Lower (legs, knees, ankles, toes): None, normal, Trunk Movements Neck, shoulders, hips: None, normal, Overall Severity Severity of abnormal movements (highest score from questions above): None, normal Incapacitation due to abnormal movements: None, normal Patient's awareness of abnormal movements (rate only patient's report): No Awareness, Dental Status Current problems with teeth and/or dentures?: No Does patient usually wear dentures?: No  CIWA:  CIWA-Ar Total: 1 COWS:  COWS Total Score: 1  Musculoskeletal: Strength & Muscle Tone: within normal limits Gait & Station: normal Patient leans: N/A  Psychiatric Specialty Exam: Physical Exam  Nursing note and vitals reviewed. Constitutional: She is oriented to person, place, and time. She appears well-developed and well-nourished.  Cardiovascular: Normal rate.  Respiratory: Effort normal.  Neurological: She is alert and oriented to person, place, and time.    Review of Systems  Constitutional: Negative.  Respiratory: Negative.   Cardiovascular: Negative.    Psychiatric/Behavioral: Positive for depression and suicidal ideas. Negative for hallucinations, memory loss and substance abuse. The patient has insomnia. The patient is not nervous/anxious.   No chest pain, no shortness of breath, no vomiting  Blood pressure (!) 135/95, pulse (!) 56, temperature 97.8 F (36.6 C), temperature source Oral, resp. rate 20, height 5\' 9"  (1.753 m), weight 104.3 kg, last menstrual period 03/19/2011, SpO2 96 %.Body mass index is 33.97 kg/m.  General Appearance: Improved grooming  Eye Contact:  Good  Speech:  Normal Rate  Volume:  Normal  Mood:  Gradual improvement compared to admission but remains labile  Affect:  Becoming more reactive, reports feeling angry today, but affect not irritable at this time  Thought Process:  Linear and Descriptions of Associations: Intact, forward thinking  Orientation:  Full (Time, Place, and Person)  Thought Content:  No hallucinations, no delusions  Suicidal Thoughts:  Yes.  without intent/plan-reports intermittent suicidal ideations , but currently does not endorse any plan or intention  Homicidal Thoughts:  No-denies any homicidal or violent ideations towards anybody, also specifically denies any violent or homicidal ideations towards police ( had expressed anger that they have not released letters)   Memory:  Recent and remote grossly intact  Judgement:  Fair/improving  Insight:  Fair/improving  Psychomotor Activity:  Normal  Concentration:  Concentration: Good and Attention Span: Good  Recall:  Good  Fund of Knowledge:  Good  Language:  Good  Akathisia:  No  Handed:  Right  AIMS (if indicated):     Assets:  Communication Skills Desire for Improvement Housing Resilience Social Support  ADL's:  Intact  Cognition:  WNL  Sleep:  Number of Hours: 5.75   Assessment: 43 year old female, history of depression, PTSD, panic disorder.  Reports severe grief/depression following recent death of her 40 year old son who  committed suicide last week.  Patient is presenting with gradual improvement compared to her admission presentation.  She is noted to be better related, more interactive with peers and staff, becoming more future oriented and making plans to move in with her father after discharge.  She does however continue to describe emotional lability, persistent suicidal thoughts (no plan or intention reported today).  She remains focused on being able to read and process her son's suicide letter before she is discharged from this unit.  No medication side effects reported  Treatment Plan Summary: Daily contact with patient to assess and evaluate symptoms and progress in treatment and Medication management  Treatment plan reviewed as below today 2/10 Encourage group and milieu participation to work on coping skills and symptom reduction Continue 1:1 monitoring for safety/suicide precaution Continue Trazodone to 150 mg QHS PRN insomnia Continue Prozac 80 mg QDAY for MDD Continue Xanax XR 2 mg QDAY for anxiety Decrease  Xanax to 0.5  mg BID PRN anxiety Continue Lopressor 25 mg BID for HTN Continue Synthroid 125 mcg QDAY for hypothyroidism Continue Protonix 40 mg QDAY for GERD Treatment team working on discharge planning options-see above  Jenne Campus, MD 06/29/2018, 5:38 PM   Patient ID: Andrea [name], female   DOB: [birth year], 43 y.o.   MRN: [id number]

## 2018-06-29 NOTE — Plan of Care (Signed)
D: Patient is in the day room upon approach. Patient is alert, oriented, pleasant, and cooperative. Patient endorses SI stating "I always have suicidal ideations, it's just part of who I am". Patient reports SI is better today though. Denies HI, AVH, and verbally contracts for safety. When asked about her day patient reports she feels "numb" and reports anxiety rated 8/10. Patient compliant with medications though she thinks the doses are too low. Patient carries pleasant conversation with many patients and staff members on the unit. Patient denies physical symptoms/pain.    A: Medications administered per MD order. Support provided. Patient educated on safety on the unit and medications. Routine safety checks every 15 minutes. Patient stated understanding to tell nurse about any new physical symptoms. Patient understands to tell staff of any needs.     R: No adverse drug reactions noted. Patient verbally contracts for safety. Patient remains safe at this time and will continue to monitor.   Problem: Medication: Goal: Compliance with prescribed medication regimen will improve Outcome: Progressing   Problem: Self-Concept: Goal: Ability to disclose and discuss suicidal ideas will improve Outcome: Progressing   Patient compliant with medications though she thinks the doses are too low. Patient able to discuss history of SI with this RN.

## 2018-06-29 NOTE — Progress Notes (Signed)
1:1 Note 1910  Patient is sitting on her bed with 1:1 present for safety.  Patient's mother is visiting at this time.  They are talking and smiling.  Patient thanked nurse for looking at her son's pictures.  Patient cannot understand why her son did this.  Patient wants to look at her son's letter that the [facility] is holding.  Respirations even and unlabored.  No signs/symptoms of pain/distress noted on patient's face/body movements.  1:1 continues for safety.

## 2018-06-29 NOTE — Progress Notes (Signed)
1:1 Note 1300  Patient was sitting in dayroom eating lunch with !:1 present for safety.  Patient's therapist's assistant Jon Gills) came to visit patient.  Charge nurse and MD informed.  Patient can stay for 30 minutes which will be up at 1320.  Patient and therapist's assistance are talking in patient's room.  Patient had stated she knows that son is dead, and sometimes it still does not feel real to her.  Patient has her son's picture in her room and wants staff to see the picture.  Respirations even and unlabored.  No signs/symptoms of pain/distress noted on patient's face/body movements.  1:1 continues for safety per MD order.

## 2018-06-29 NOTE — Progress Notes (Signed)
1:1 Not3 4128  Patient has been laying in bed talking to 1:1.  They have been laughing at times.  Patient in good mood.  Patient has felt angry at times at the [facility] about her letters.  Respirations even and unlabored.  No signs/symptoms of pain/distress noted on patient's face/body movements.  1:1 continues per MD order.

## 2018-06-29 NOTE — [facility] Group Notes (Signed)
LCSW Group Therapy Note 06/29/2018 12:12 PM  Type of Therapy and Topic: Group Therapy: Overcoming Obstacles  Participation Level: Active  Description of Group:  In this group patients will be encouraged to explore what they see as obstacles to their own wellness and recovery. They will be guided to discuss their thoughts, feelings, and behaviors related to these obstacles. The group will process together ways to cope with barriers, with attention given to specific choices patients can make. Each patient will be challenged to identify changes they are motivated to make in order to overcome their obstacles. This group will be process-oriented, with patients participating in exploration of their own experiences as well as giving and receiving support and challenge from other group members.  Therapeutic Goals: 1. Patient will identify personal and current obstacles as they relate to admission. 2. Patient will identify barriers that currently interfere with their wellness or overcoming obstacles.  3. Patient will identify feelings, thought process and behaviors related to these barriers. 4. Patient will identify two changes they are willing to make to overcome these obstacles:   Summary of Patient Progress  Andrea [name] was engaged and participated throughout the group session. Andrea [name] states that her main obstacle is "wanting closure". Andrea [name] reports that she continues to struggle with her son's passing due to not having any closure. Andrea [name] reports that she is upset with the [facility] department due to their refusal to provide her son's suicidal letters due to an open investigation. Andrea [name] reports that the letter will help her find closure, so that she can move forward with her life.   Therapeutic Modalities:  Cognitive Behavioral Therapy Solution Focused Therapy Motivational Interviewing Relapse Prevention Therapy   Theresa Duty Clinical Social Worker

## 2018-06-29 NOTE — Progress Notes (Signed)
Adult Psychoeducational Group Note  Date:  06/29/2018 Time:  9:39 PM  Group Topic/Focus:  Wrap-Up Group:   The focus of this group is to help patients review their daily goal of treatment and discuss progress on daily workbooks.  Participation Level:  Active  Participation Quality:  Appropriate  Affect:  Appropriate  Cognitive:  Appropriate  Insight: Appropriate  Engagement in Group:  Engaged  Modes of Intervention:  Discussion  Additional Comments:  Patient attended group and participated.   Noriel Guthrie W Brylen Wagar 09/03/1276, 9:39 PM

## 2018-06-29 NOTE — [facility] Group Notes (Signed)
[facility] Group Notes:  (Nursing/MHT/Case Management/Adjunct)  Date:  06/29/2018  Time:  4:00 pm  Type of Therapy:  psychoeducational group  Participation Level:  Did Not Attend  Participation Quality:    Affect:    Cognitive:   Insight:    Engagement in Group:    Modes of Intervention:    Summary of Progress/Problems:  Andrea [name] 06/29/2018, 6:48 PM

## 2018-06-30 MED ORDER — TRAZODONE HCL 100 MG PO TABS
200.0000 mg | ORAL_TABLET | Freq: Every evening | ORAL | Status: DC | PRN
  Administered 2018-06-30 – 2018-07-03 (×4): 200 mg via ORAL
  Filled 2018-06-30 (×4): qty 2

## 2018-06-30 NOTE — Plan of Care (Signed)
Nurse discussed anxiety, depression, coping skills with patient. 

## 2018-06-30 NOTE — Progress Notes (Addendum)
1:1 Discontinued 1500 Note  Patient has been sitting in the dayroom talking with peers/staff.  Stated she talked to her dad again.  Respirations even and unlabored.  No signs/symptoms of pain/distress noted on patient's face/body movements.  Safety maintained with 15 minute checks.

## 2018-06-30 NOTE — Progress Notes (Signed)
Patient ID: Andrea [name], female   DOB: 15-Dec-1975, 43 y.o.   MRN: [id number]  1:1 Note  Pt at this time is in bed resting with eyes closed. Pt does not look to be in any distress at this time. 1:1 staff is present in room with Pt at this time. 1:1 monitoring continues for Pt's safety. 15-minute safety checks also continues at this time.

## 2018-06-30 NOTE — Progress Notes (Signed)
Patient ID: Andrea [name], female   DOB: [birth year], 43 y.o.   MRN: [id number] Pt at this time is in bed resting with eyes closed. Pt does not look to be in any acute distress. 1:1 staff is present in room with Pt at this time. 1:1 monitoring continues for Pt's safety. 15-minute safety checks also continue at this time.

## 2018-06-30 NOTE — [facility] Group Notes (Signed)
Adult Psychoeducational Group Note  Date:  06/30/2018 Time:  9:07 PM  Group Topic/Focus:  Wrap-Up Group:   The focus of this group is to help patients review their daily goal of treatment and discuss progress on daily workbooks.  Participation Level:  Active  Participation Quality:  Appropriate and Attentive  Affect:  Depressed and Flat  Cognitive:  Alert and Appropriate  Insight: Appropriate  Engagement in Group:  Engaged  Modes of Intervention:  Discussion, Education and Support  Additional Comments:  Pt attended and participated in wrap up group this evening. Pt was taken 1:1, but is unsure if they are ready to be alone. Pt feels lonely in their room. Pt goal is to get better and to cope with what happened at home.   Cristi Loron 06/30/2018, 9:07 PM

## 2018-06-30 NOTE — Progress Notes (Signed)
1:1 Discontinued 1005  Patient has talked to MD.  Stated she feels alittle scared being off 1:1.  She knows she can talk to staff any time.  Patient contracts for safety.  Patient has been sitting in dayroom talking to staff/patients.  Respirations even and unlabored.  No signs/symptoms of pain/distress noted on patient's face/body movements.  Safety maintained with 15 minute checks.

## 2018-06-30 NOTE — Progress Notes (Signed)
1:1 Note 0906  Patient's 1:1 discontinued per MD order.  Patient informed.  Patient walked to nurse's station and stated she will sit in dayroom.  Patient denied SI at this time, contracts for safety.  Denied A/V hallucinations.  Denied HI. Safety will be maintained with 15 minute checks.

## 2018-06-30 NOTE — Progress Notes (Signed)
[facility] MD Progress Note  06/30/2018 10:42 AM Andrea [name]  MRN:  [id number] Subjective: Patient is seen and examined.  Patient is a 43 year old female with history of depression, PTSD, panic disorder and borderline personality disorder who was admitted on 30-Jun-2018 after finding her son dead by self inflicted gunshot wound.  Objective: Patient is seen and examined.  Patient is a 43 year old female with the above-stated past psychiatric history seen in follow-up.  Patient is familiar to me from the last block.  She is improved since last time I saw her.  She is not hysterical, and crying constantly.  She still needs total reassurance constantly.  She denied suicidal ideation and she stated that she told her son and her father that she would never hurt herself.  Despite that she is requesting to go back on one-to-one.  I told her I felt as though she needed to be able to show that she could be safe by herself before we would be able to discharge her.  She stated she would like to go to the [facility] facility, but her insurance does not pay for it, and she stated that she prefer father not know about this.  She stated "he has money and he will try and pay for it".  She remains upset and agitated over the fact that the police will not give her her son's suicide letters.  She stated that Dr. Parke Poisson decreased her trazodone last night, and she would like to have that increased back up to 200.  We discussed potentially going to the intensive outpatient program after discharge.  She denied any side effects to her current medications.  Her blood pressure today is 135/95, pulse is 56.  Nursing notes state that she slept 6.75 hours last night.  No new laboratories.  Principal Problem: Severe recurrent major depression without psychotic features ([place]) Diagnosis: Principal Problem:   Severe recurrent major depression without psychotic features ([place])  Total Time spent with patient: 30 minutes  Past Psychiatric  History: See admission H&P  Past Medical History:  Past Medical History:  Diagnosis Date  . Anxiety   . Arthritis    ruptured lumbar disc-careful with positioning  . Blood dyscrasia    protein s deficiency-no treatment since 2006  . Depression   . GERD (gastroesophageal reflux disease)   . Headache(784.0)   . Hemorrhoids   . High cholesterol   . Hyperlipemia   . Hypertension   . Hypothyroidism   . IBS (irritable bowel syndrome)   . MVP (mitral valve prolapse)    echo per dr Dagmar Hait  . Protein S deficiency ([place]) 2003   dvt/pulmonary embolus-on coumadin for 6 months then off-Sees [facility]  . Pulmonary embolism ([place])   . PVC (premature ventricular contraction)     Past Surgical History:  Procedure Laterality Date  . CESAREAN SECTION  2006  . CHOLECYSTECTOMY N/A 11/03/2014   Procedure: LAPAROSCOPIC CHOLECYSTECTOMY WITH INTRAOPERATIVE CHOLANGIOGRAM;  Surgeon: Georganna Skeans, MD;  Location: [facility];  Service: General;  Laterality: N/A;  . LAPAROSCOPIC ASSISTED VAGINAL HYSTERECTOMY  03/27/2011   Procedure: LAPAROSCOPIC ASSISTED VAGINAL HYSTERECTOMY;  Surgeon: Cyril Mourning, MD;  Location: [facility];  Service: Gynecology;  Laterality: N/A;  . SALPINGOOPHORECTOMY  03/27/2011   Procedure: SALPINGO OOPHERECTOMY;  Surgeon: Cyril Mourning, MD;  Location: [facility];  Service: Gynecology;  Laterality: Bilateral;  . TONSILLECTOMY  92  . vaginal reconstructive surgery  2001   Family History:  Family History  Problem Relation Age of  Onset  . High blood pressure Mother   . Hyperlipidemia Father        fathers side of family  . High blood pressure Father   . Hypertension Other        entire family on both sides  . Asthma Son   . Asthma Son   . Clotting disorder Maternal Uncle   . Clotting disorder Paternal Grandmother   . Heart disease Maternal Grandfather    Family Psychiatric  History: See admission H&P Social History:  Social History   Substance and Sexual Activity  Alcohol  Use Never  . Frequency: Never     Social History   Substance and Sexual Activity  Drug Use No    Social History   Socioeconomic History  . Marital status: Divorced    Spouse name: Not on file  . Number of children: 2  . Years of education: Not on file  . Highest education level: Not on file  Occupational History  . Occupation: nutrition    Employer: [employer]  Social Needs  . Financial resource strain: Not hard at all  . Food insecurity:    Worry: Never true    Inability: Never true  . Transportation needs:    Medical: No    Non-medical: No  Tobacco Use  . Smoking status: Former Smoker    Packs/day: 1.00    Years: 15.00    Pack years: 15.00    Types: Cigarettes    Last attempt to quit: 03/18/2004    Years since quitting: 14.2  . Smokeless tobacco: Never Used  Substance and Sexual Activity  . Alcohol use: Never    Frequency: Never  . Drug use: No  . Sexual activity: Yes  Lifestyle  . Physical activity:    Days per week: 3 days    Minutes per session: 30 min  . Stress: To some extent  Relationships  . Social connections:    Talks on phone: More than three times a week    Gets together: More than three times a week    Attends religious service: Never    Active member of club or organization: No    Attends meetings of clubs or organizations: Never    Relationship status: Divorced  Other Topics Concern  . Not on file  Social History Narrative   Diet: Regular   Caffeine: Yes   Married- divorced, married in 2006   House: Yes, 2 persons   Pets: 1 dog   Current/Past profession: [employer] 2002-2013   Exercise: Yes, walking   Living Will: No    DNR: No    POA/HPOA: No       Additional Social History:    Pain Medications: SEE MAR.  Prescriptions: Pt reports being prescribed Xanax, Trazodone, Prozac and other medications she cannot recall.  Over the Counter: SEE MAR.  History of alcohol / drug use?: No history of alcohol / drug abuse                     Sleep: Fair  Appetite:  Good  Current Medications: Current Facility-Administered Medications  Medication Dose Route Frequency Provider Last Rate Last Dose  . acetaminophen (TYLENOL) tablet 650 mg  650 mg Oral Q6H PRN Lindon Romp A, NP   650 mg at 06/26/18 1746  . ALPRAZolam (XANAX XR) 24 hr tablet 2 mg  2 mg Oral Daily Sharma Covert, MD   2 mg at 06/30/18 0816  . ALPRAZolam (  XANAX) tablet 0.5 mg  0.5 mg Oral BID PRN Cobos, Myer Peer, MD   0.5 mg at 06/29/18 2110  . alum & mag hydroxide-simeth (MAALOX/MYLANTA) 200-200-20 MG/5ML suspension 30 mL  30 mL Oral Q4H PRN Lindon Romp A, NP      . cholecalciferol (VITAMIN D3) tablet 1,000 Units  1,000 Units Oral Daily Sharma Covert, MD   1,000 Units at 06/30/18 0815  . colestipol (COLESTID) tablet 2 g  2 g Oral BID Lindon Romp A, NP   2 g at 06/30/18 0815  . dicyclomine (BENTYL) tablet 20 mg  20 mg Oral TID AC & HS Sharma Covert, MD   20 mg at 06/30/18 0647  . FLUoxetine (PROZAC) capsule 80 mg  80 mg Oral Daily Sharma Covert, MD   80 mg at 06/30/18 9937  . levothyroxine (SYNTHROID, LEVOTHROID) tablet 125 mcg  125 mcg Oral Q0600 Lindon Romp A, NP   125 mcg at 06/30/18 0647  . magnesium hydroxide (MILK OF MAGNESIA) suspension 30 mL  30 mL Oral Daily PRN Lindon Romp A, NP      . metoprolol tartrate (LOPRESSOR) tablet 25 mg  25 mg Oral BID Sharma Covert, MD   25 mg at 06/30/18 0814  . oseltamivir (TAMIFLU) capsule 75 mg  75 mg Oral Daily Carlyle Basques, MD   75 mg at 06/30/18 0814  . pantoprazole (PROTONIX) EC tablet 40 mg  40 mg Oral Daily Sharma Covert, MD   40 mg at 06/30/18 1696  . traZODone (DESYREL) tablet 200 mg  200 mg Oral QHS PRN Sharma Covert, MD        Lab Results: No results found for this or any previous visit (from the past 48 hour(s)).  Blood Alcohol level:  No results found for: [facility]  Metabolic Disorder Labs: Lab Results  Component Value Date   HGBA1C 5.6 06/21/2018    MPG 114.02 06/21/2018   MPG 114 06/13/2017   No results found for: PROLACTIN Lab Results  Component Value Date   CHOL 258 (H) 06/21/2018   TRIG 185 (H) 06/21/2018   HDL 50 06/21/2018   CHOLHDL 5.2 06/21/2018   VLDL 37 06/21/2018   LDLCALC 171 (H) 06/21/2018   LDLCALC 172 (H) 03/13/2018    Physical Findings: AIMS: Facial and Oral Movements Muscles of Facial Expression: None, normal Lips and Perioral Area: None, normal Jaw: None, normal Tongue: None, normal,Extremity Movements Upper (arms, wrists, hands, fingers): None, normal Lower (legs, knees, ankles, toes): None, normal, Trunk Movements Neck, shoulders, hips: None, normal, Overall Severity Severity of abnormal movements (highest score from questions above): None, normal Incapacitation due to abnormal movements: None, normal Patient's awareness of abnormal movements (rate only patient's report): No Awareness, Dental Status Current problems with teeth and/or dentures?: No Does patient usually wear dentures?: No  CIWA:  CIWA-Ar Total: 1 COWS:  COWS Total Score: 1  Musculoskeletal: Strength & Muscle Tone: within normal limits Gait & Station: normal Patient leans: N/A  Psychiatric Specialty Exam: Physical Exam  Nursing note and vitals reviewed. Constitutional: She is oriented to person, place, and time. She appears well-developed and well-nourished.  HENT:  Head: Normocephalic and atraumatic.  Respiratory: Effort normal.  Neurological: She is alert and oriented to person, place, and time.    ROS  Blood pressure (!) 135/95, pulse (!) 56, temperature 97.8 F (36.6 C), temperature source Oral, resp. rate 20, height 5\' 9"  (1.753 m), weight 104.3 kg, last menstrual period 03/19/2011, SpO2 96 %.Body  mass index is 33.97 kg/m.  General Appearance: Casual  Eye Contact:  Good  Speech:  Pressured  Volume:  Increased  Mood:  Anxious  Affect:  Congruent  Thought Process:  Coherent and Descriptions of Associations: Intact   Orientation:  Full (Time, Place, and Person)  Thought Content:  Logical  Suicidal Thoughts:  No  Homicidal Thoughts:  No  Memory:  Immediate;   Fair Recent;   Fair Remote;   Fair  Judgement:  Intact  Insight:  Lacking  Psychomotor Activity:  Increased  Concentration:  Concentration: Fair and Attention Span: Fair  Recall:  [employer] of Knowledge:  Fair  Language:  Good  Akathisia:  Negative  Handed:  Right  AIMS (if indicated):     Assets:  Desire for Improvement Housing Leisure Time Physical Health Social Support  ADL's:  Intact  Cognition:  WNL  Sleep:  Number of Hours: 6.75     Treatment Plan Summary: Daily contact with patient to assess and evaluate symptoms and progress in treatment, Medication management and Plan : Patient is seen and examined.  Patient is a 43 year old female with the above-stated past psychiatric history who is seen in follow-up.  She is significantly improved from the last time I saw her.  Social work continues to work on the possibility of [employer] versus an intensive outpatient program.  We will see which one may be approved.  From my understanding it is that her insurance will not pay for [facility].  She request to be placed on one-to-one, and I said no.  I explained to her that is important for [place] to see her be able to take care of herself before we are able to discharge her to any kind of outpatient plan.  She stated she felt as though she could not be alone.  I told her if that was true then perhaps we have to start working on a transfer to a long-term date facility.  She stated she would prefer to avoid that.  She continues on Xanax, Xanax XR, Bentyl, Prozac, trazodone.  No change in her medications today except increasing her trazodone back up to 200 mg. 1.  Continue Xanax XR 2 mg p.o. daily for anxiety. 2.  Continue Xanax 0.5 mg p.o. twice daily as needed anxiety. 3.  Continue colestipol 2 g p.o. twice daily for GI issues. 4.  Continue Bentyl 20 mg  p.o. 3 times daily before meals and at bedtime for irritable bowel syndrome. 5. continue fluoxetine 80 mg p.o. daily for mood. 6.  Continue levothyroxine 125 mcg p.o. daily for hypothyroidism. 7.  Continue metoprolol 25 mg p.o. twice daily for blood pressure and tachycardia. 8.  Continue Tamiflu 75 mg p.o. daily for 10 days for influenza. 9.  Continue Protonix 40 mg p.o. daily for GERD. 10.  Increase trazodone to 200 mg p.o. nightly as needed insomnia. 11.  Disposition planning-in progress.  Sharma Covert, MD 06/30/2018, 10:42 AM

## 2018-06-30 NOTE — Progress Notes (Signed)
1:1 Note discontinued 1630  Patient has been sitting in dayroom talking with peers/staff.  Patient has been pleasant, no problems, no crying.  Respirations even and unlabored.  No signs/symptoms of pain/distress noted on patient's face/body movements.  Safety maintained with 15 minute checks.

## 2018-06-30 NOTE — Progress Notes (Signed)
Patient attended grief and loss group facilitated by Jerene Pitch, [place], [place], and Kerry Hough, [place], [facility], [place].   Group focuses on change and loss experienced by group members; topics include loss due to death, loss of relationships, loss of a place, loss of sense of self, etc. Group members are invited to share the changes and losses that are currently affecting their lives. Facilitators tie together shared experiences between group members and validate emotions felt by participants.  Patient Andrea [name] attended and participated in group. Pt shared her experience grieving the loss of her son. She reported experiencing flashbacks. She reported feeling emotions of anger, betrayal, sadness, and confusion. Pt stated that she feels like she is in a better place now than she was when she came into the hospital. Pt reported that she forgets that her son died and then relives it when she remembers that he is not at home waiting for her. Pt reported that going home will be too much for her and that she and her father are moving. Pt connected her grief experience to others in the group and her emotions were validated.

## 2018-06-30 NOTE — Progress Notes (Signed)
1:1 Note 0800  Patient sitting in bed eating breakfast.  1:1 continues for patient's safety.  Patient denied SI at this time, contracts for safety.  Denied HI.  Denied A/V hallucinations.  Denied pain.  Respirations even and unlabored.  No signs/symptoms of pain/distress noted on patient's face/body movements.  1:1 continues for safety.

## 2018-06-30 NOTE — Progress Notes (Signed)
1:1 Discontinued Note Andrea [name] Patient was in group with chaplain this morning.  Patient participated and talked about her story.  When group was over, patient took her Andrea [name] medication bentyl.  Respirations even and unlabored.  No signs/symptoms of pain/distress noted on patient's face/body movements.  Safety maintained with 15 minute checks.

## 2018-06-30 NOTE — Progress Notes (Signed)
1:1 Discontinued Note 9290  Patient returned from lunch in cafeteria with group.  Patient stated she ate 90% of her lunch.  Patient has talked to her dad and he knows that her insurance will not pay for [facility].  Patient stated she has decided to attend outpatient therapy.  Dad is looking for a house to buy/rent for them.  Someone will be staying with her all the time.  She will never be alone after discharge.  She still feels alittle scared and being around people in the cafeteria makes her feel uncomfortable.   Presently patient is sitting in dayroom talking to staff/peers.  Respirations even and unlabored.  No signs/symptoms of pain/distress noted on patient's face/body movements.  Safety maintained with 15 minute checks.

## 2018-07-01 NOTE — Progress Notes (Signed)
The  Patient shared in group that she had a good day since her father found her a new place to live following discharge. She states that she can not return to her former home since that is where her child completed suicide. She had a good visit with her parents. Her goal for tomorrow is to go to the cafeteria and remain there rather than having a panic attack.

## 2018-07-01 NOTE — Plan of Care (Addendum)
Patient slept well last night, sleep mediation was requested and was helpful. Depression, hopelessness, and anxiety rated 6, 2, 8 out of 10. Denies SI HI AVH. Pain rated 6/10 in bladder and urethra. Patient's goal is "go to groups and socialize."  Patient is compliant with 15 minute checks as well as environmental checks. Will continue to monitor.  Problem: Coping: Goal: Ability to identify and develop effective coping behavior will improve Outcome: Progressing   Problem: Self-Concept: Goal: Ability to identify factors that promote anxiety will improve Outcome: Progressing Goal: Level of anxiety will decrease Outcome: Progressing Goal: Ability to modify response to factors that promote anxiety will improve Outcome: Progressing   Problem: Education: Goal: Ability to make informed decisions regarding treatment will improve Outcome: Progressing   Problem: Coping: Goal: Coping ability will improve Outcome: Progressing

## 2018-07-01 NOTE — Tx Team (Signed)
Interdisciplinary Treatment and Diagnostic Plan Update  07/01/2018 Time of Session: 9:00am Andrea [name] MRN: [id number]  Principal Diagnosis: Severe recurrent major depression without psychotic features [facility])  Secondary Diagnoses: Principal Problem:   Severe recurrent major depression without psychotic features ([place])   Current Medications:  Current Facility-Administered Medications  Medication Dose Route Frequency Provider Last Rate Last Dose  . acetaminophen (TYLENOL) tablet 650 mg  650 mg Oral Q6H PRN Lindon Romp A, NP   650 mg at 06/26/18 1746  . ALPRAZolam (XANAX XR) 24 hr tablet 2 mg  2 mg Oral Daily Sharma Covert, MD   2 mg at 07/01/18 6962  . ALPRAZolam Duanne Moron) tablet 0.5 mg  0.5 mg Oral BID PRN Cobos, Myer Peer, MD   0.5 mg at 06/30/18 1659  . alum & mag hydroxide-simeth (MAALOX/MYLANTA) 200-200-20 MG/5ML suspension 30 mL  30 mL Oral Q4H PRN Lindon Romp A, NP      . cholecalciferol (VITAMIN D3) tablet 1,000 Units  1,000 Units Oral Daily Sharma Covert, MD   1,000 Units at 07/01/18 [phone]  . colestipol (COLESTID) tablet 2 g  2 g Oral BID Lindon Romp A, NP   2 g at 06/30/18 1815  . dicyclomine (BENTYL) tablet 20 mg  20 mg Oral TID AC & HS Sharma Covert, MD   20 mg at 07/01/18 4132  . FLUoxetine (PROZAC) capsule 80 mg  80 mg Oral Daily Sharma Covert, MD   80 mg at 07/01/18 4401  . levothyroxine (SYNTHROID, LEVOTHROID) tablet 125 mcg  125 mcg Oral Q0600 Lindon Romp A, NP   125 mcg at 07/01/18 0272  . magnesium hydroxide (MILK OF MAGNESIA) suspension 30 mL  30 mL Oral Daily PRN Lindon Romp A, NP      . metoprolol tartrate (LOPRESSOR) tablet 25 mg  25 mg Oral BID Sharma Covert, MD   25 mg at 07/01/18 5366  . oseltamivir (TAMIFLU) capsule 75 mg  75 mg Oral Daily Carlyle Basques, MD   75 mg at 07/01/18 0829  . pantoprazole (PROTONIX) EC tablet 40 mg  40 mg Oral Daily Sharma Covert, MD   40 mg at 07/01/18 4403  . traZODone (DESYREL) tablet 200 mg  200  mg Oral QHS PRN Sharma Covert, MD   200 mg at 06/30/18 2104   PTA Medications: Medications Prior to Admission  Medication Sig Dispense Refill Last Dose  . ALPRAZolam (XANAX XR) 2 MG 24 hr tablet Take 1 tablet by mouth 2 (two) times daily.     Marland Kitchen ALPRAZolam (XANAX) 1 MG tablet Take 1 tablet by mouth 2 (two) times daily as needed.     . colestipol (COLESTID) 1 g tablet Take 2 tablets (2 g total) by mouth 2 (two) times daily. 120 tablet 6   . esomeprazole (NEXIUM) 40 MG capsule Take 1 capsule by mouth daily.     Marland Kitchen ezetimibe (ZETIA) 10 MG tablet TAKE 1 TABLET BY MOUTH EVERY DAY 90 tablet 1 Taking  . FLUoxetine (PROZAC) 40 MG capsule Take 80 mg by mouth daily.   Taking  . lamoTRIgine (LAMICTAL) 150 MG tablet Take 150 mg by mouth daily.   Taking  . losartan (COZAAR) 100 MG tablet TAKE 1 TABLET BY MOUTH EVERY DAY 90 tablet 1 Taking  . lurasidone (LATUDA) 20 MG TABS tablet Take 20 mg by mouth daily.   Taking  . metoprolol tartrate (LOPRESSOR) 25 MG tablet TAKE 1 TABLET BY MOUTH TWICE DAILY 180 tablet 1  Taking  . metroNIDAZOLE (FLAGYL) 500 MG tablet Take 1 tablet by mouth 2 (two) times daily. X 7 days ordered 06/20/18     . oxyCODONE-acetaminophen (PERCOCET) 7.5-325 MG tablet Take 1 tablet by mouth every 6 (six) hours as needed for severe pain.   Taking  . rosuvastatin (CRESTOR) 20 MG tablet Take one tablet by mouth once daily 90 tablet 0   . SYNTHROID 125 MCG tablet TAKE 1 TABLET(125 MCG) BY MOUTH DAILY BEFORE BREAKFAST 30 tablet 6 Taking  . trazodone (DESYREL) 300 MG tablet Take 300 mg by mouth at bedtime.   Taking  . valACYclovir (VALTREX) 1000 MG tablet Take 2 tabs po BID x 1 day for outbreaks as needed 40 tablet 3 Taking  . Vitamin D, Ergocalciferol, (DRISDOL) 1.25 MG (50000 UT) CAPS capsule Take 1 capsule by mouth every 7 (seven) days.       Patient Stressors: Traumatic event  Patient Strengths: Physical Health  Treatment Modalities: Medication Management, Group therapy, Case management,   1 to 1 session with clinician, Psychoeducation, Recreational therapy.   Physician Treatment Plan for Primary Diagnosis: Severe recurrent major depression without psychotic features ([place]) Long Term Goal(s): Improvement in symptoms so as ready for discharge Improvement in symptoms so as ready for discharge   Short Term Goals: Ability to disclose and discuss suicidal ideas Ability to demonstrate self-control will improve Ability to identify and develop effective coping behaviors will improve Ability to maintain clinical measurements within normal limits will improve Compliance with prescribed medications will improve Ability to identify triggers associated with substance abuse/mental health issues will improve  Medication Management: Evaluate patient's response, side effects, and tolerance of medication regimen.  Therapeutic Interventions: 1 to 1 sessions, Unit Group sessions and Medication administration.  Evaluation of Outcomes: Progressing  Physician Treatment Plan for Secondary Diagnosis: Principal Problem:   Severe recurrent major depression without psychotic features ([place])  Long Term Goal(s): Improvement in symptoms so as ready for discharge Improvement in symptoms so as ready for discharge   Short Term Goals: Ability to disclose and discuss suicidal ideas Ability to demonstrate self-control will improve Ability to identify and develop effective coping behaviors will improve Ability to maintain clinical measurements within normal limits will improve Compliance with prescribed medications will improve Ability to identify triggers associated with substance abuse/mental health issues will improve     Medication Management: Evaluate patient's response, side effects, and tolerance of medication regimen.  Therapeutic Interventions: 1 to 1 sessions, Unit Group sessions and Medication administration.  Evaluation of Outcomes: Progressing   RN Treatment Plan for Primary Diagnosis:  Severe recurrent major depression without psychotic features ([place]) Long Term Goal(s): Knowledge of disease and therapeutic regimen to maintain health will improve  Short Term Goals: Ability to verbalize frustration and anger appropriately will improve, Ability to participate in decision making will improve, Ability to verbalize feelings will improve, Ability to disclose and discuss suicidal ideas and Ability to identify and develop effective coping behaviors will improve  Medication Management: RN will administer medications as ordered by provider, will assess and evaluate patient's response and provide education to patient for prescribed medication. RN will report any adverse and/or side effects to prescribing provider.  Therapeutic Interventions: 1 on 1 counseling sessions, Psychoeducation, Medication administration, Evaluate responses to treatment, Monitor vital signs and CBGs as ordered, Perform/monitor CIWA, COWS, AIMS and Fall Risk screenings as ordered, Perform wound care treatments as ordered.  Evaluation of Outcomes: Progressing   LCSW Treatment Plan for Primary Diagnosis: Severe recurrent major depression  without psychotic features [facility]) Long Term Goal(s): Safe transition to appropriate next level of care at discharge, Engage patient in therapeutic group addressing interpersonal concerns.  Short Term Goals: Engage patient in aftercare planning with referrals and resources, Increase social support and Increase skills for wellness and recovery  Therapeutic Interventions: Assess for all discharge needs, 1 to 1 time with Social worker, Explore available resources and support systems, Assess for adequacy in community support network, Educate family and significant other(s) on suicide prevention, Complete Psychosocial Assessment, Interpersonal group therapy.  Evaluation of Outcomes: Progressing   Progress in Treatment: Attending groups: Yes. Participating in groups: Yes. Taking  medication as prescribed: Yes. Toleration medication: Yes. Family/Significant other contact made: Yes, individual(s) contacted:  patient's father and therapist Debria Garret Patient understands diagnosis: Yes. Discussing patient identified problems/goals with staff: Yes. Medical problems stabilized or resolved: Yes. Denies suicidal/homicidal ideation: No. Patient currently on 1:1 Issues/concerns per patient self-inventory: No  New problem(s) identified: None   New Short Term/Long Term Goal(s): medication stabilization, elimination of SI thoughts, development of comprehensive mental wellness plan.   Patient Goals:    Discharge Plan or Barriers: Patient lives with he father currently. Patient currently follows up with Dr. Toy Care for medication management and Debria Garret for therapy services. The patient's medication management provider and therapist are recommending long term treatment for the patient at discharge. CSW will continue to follow and assess for appropriate referrals and possible discharge planning. 06/26/2018: Patient continues to refuse long term treatment at this time. CSW and OP therapist, Debria Garret spoke with patient regarding longer term treatment for continuity of care at discharge. CSW will continue to follow.   Update 02/12: Patient receptive to [facility] referral. Patient lived with her father prior to admission and they are discussing moving into a new home.   Reason for Continuation of Hospitalization: Anxiety Depression Medication stabilization Suicidal ideation  Estimated Length of Stay: 07/03/2018  Attendees: Patient: 07/01/2018 9:41 AM  Physician: Dr. Myles Lipps, MD; Dr. Neita Garnet, MD 07/01/2018 9:41 AM  Nursing: Opal Sidles.Jenetta Downer, RN; Patty.D, RN 07/01/2018 9:41 AM  RN Care Manager: 07/01/2018 9:41 AM  Social Worker: Radonna Ricker, [place], [place] 07/01/2018 9:41 AM  Recreational Therapist:  07/01/2018 9:41 AM  Other: Harriett Sine, NP  07/01/2018 9:41 AM  Other:   07/01/2018 9:41 AM  Other: 07/01/2018 9:41 AM    Scribe for Treatment Team: Joellen [place], [place] 07/01/2018 9:41 AM

## 2018-07-01 NOTE — Progress Notes (Signed)
Patient ID: Andrea [name], female   DOB: 09-03-1975, 43 y.o.   MRN: [id number] DAR Note:   Pt observed in the dayroom interacting with peers. Pt continue to endorse moderate anxiety and depression. Pt denies SI/HI at this time; "thanks to you (the RN), I am starting to accept everything and I will be very patient when it comes to retrieving my son's suicide note from the police. I now understand they could be using it for their investigation on my son's death." Pt was med compliant. All patient's questions and concerns addressed. Support, encouragement, and safe environment provided. Will continue to monitor for any changes. 15-minute safety checks continue.

## 2018-07-01 NOTE — Progress Notes (Signed)
[facility] MD Progress Note  07/01/2018 12:08 PM Andrea [name]  MRN:  [id number] Subjective:  Patient is seen and examined.  Patient is a 43 year old female with history of depression, PTSD, panic disorder and borderline personality disorder who was admitted on 05-Jul-2018 after finding her son dead by self inflicted gunshot wound.  Objective: Patient is seen and examined.  Patient is a 43 year old female with the above-stated past psychiatric history seen in follow-up.  Patient is familiar to me from the last block.    She continues to slowly improve.  She continues to have problems processing the death of her son.  She focuses on it a great deal.  She discussed the fact that she is always suicidal and that the suicidal thoughts come and go.  She stated that during the interview she was not suicidal.  She continues to reinforce that she is not going back to her previous home, and that her father is buying a different home for them to live in.  She is excited to a degree about becoming involved in the intensive outpatient program here.  She denied any side effects to her medications.  She stated she slept better with the increased dose of the trazodone.  Her vital signs are stable, she is afebrile.  She slept 6.25 hours.  No new laboratories.  Principal Problem: Severe recurrent major depression without psychotic features ([place]) Diagnosis: Principal Problem:   Severe recurrent major depression without psychotic features ([place])  Total Time spent with patient: 15 minutes  Past Psychiatric History: See admission H&P  Past Medical History:  Past Medical History:  Diagnosis Date  . Anxiety   . Arthritis    ruptured lumbar disc-careful with positioning  . Blood dyscrasia    protein s deficiency-no treatment since 2006  . Depression   . GERD (gastroesophageal reflux disease)   . Headache(784.0)   . Hemorrhoids   . High cholesterol   . Hyperlipemia   . Hypertension   . Hypothyroidism   . IBS  (irritable bowel syndrome)   . MVP (mitral valve prolapse)    echo per dr Dagmar Hait  . Protein S deficiency ([place]) 2003   dvt/pulmonary embolus-on coumadin for 6 months then off-Sees  Pulmonary  . Pulmonary embolism ([place])   . PVC (premature ventricular contraction)     Past Surgical History:  Procedure Laterality Date  . CESAREAN SECTION  2006  . CHOLECYSTECTOMY N/A 11/03/2014   Procedure: LAPAROSCOPIC CHOLECYSTECTOMY WITH INTRAOPERATIVE CHOLANGIOGRAM;  Surgeon: Georganna Skeans, MD;  Location: [facility];  Service: General;  Laterality: N/A;  . LAPAROSCOPIC ASSISTED VAGINAL HYSTERECTOMY  03/27/2011   Procedure: LAPAROSCOPIC ASSISTED VAGINAL HYSTERECTOMY;  Surgeon: Cyril Mourning, MD;  Location: [facility];  Service: Gynecology;  Laterality: N/A;  . SALPINGOOPHORECTOMY  03/27/2011   Procedure: SALPINGO OOPHERECTOMY;  Surgeon: Cyril Mourning, MD;  Location: [facility];  Service: Gynecology;  Laterality: Bilateral;  . TONSILLECTOMY  92  . vaginal reconstructive surgery  2001   Family History:  Family History  Problem Relation Age of Onset  . High blood pressure Mother   . Hyperlipidemia Father        fathers side of family  . High blood pressure Father   . Hypertension Other        entire family on both sides  . Asthma Son   . Asthma Son   . Clotting disorder Maternal Uncle   . Clotting disorder Paternal Grandmother   . Heart disease Maternal Grandfather    Family  Psychiatric  History: See admission H&P Social History:  Social History   Substance and Sexual Activity  Alcohol Use Never  . Frequency: Never     Social History   Substance and Sexual Activity  Drug Use No    Social History   Socioeconomic History  . Marital status: Divorced    Spouse name: Not on file  . Number of children: 2  . Years of education: Not on file  . Highest education level: Not on file  Occupational History  . Occupation: nutrition    Employer: [employer]  Social Needs  . Financial  resource strain: Not hard at all  . Food insecurity:    Worry: Never true    Inability: Never true  . Transportation needs:    Medical: No    Non-medical: No  Tobacco Use  . Smoking status: Former Smoker    Packs/day: 1.00    Years: 15.00    Pack years: 15.00    Types: Cigarettes    Last attempt to quit: 03/18/2004    Years since quitting: 14.2  . Smokeless tobacco: Never Used  Substance and Sexual Activity  . Alcohol use: Never    Frequency: Never  . Drug use: No  . Sexual activity: Yes  Lifestyle  . Physical activity:    Days per week: 3 days    Minutes per session: 30 min  . Stress: To some extent  Relationships  . Social connections:    Talks on phone: More than three times a week    Gets together: More than three times a week    Attends religious service: Never    Active member of club or organization: No    Attends meetings of clubs or organizations: Never    Relationship status: Divorced  Other Topics Concern  . Not on file  Social History Narrative   Diet: Regular   Caffeine: Yes   Married- divorced, married in 2006   House: Yes, 2 persons   Pets: 1 dog   Current/Past profession: [employer] 2002-2013   Exercise: Yes, walking   Living Will: No    DNR: No    POA/HPOA: No       Additional Social History:    Pain Medications: SEE MAR.  Prescriptions: Pt reports being prescribed Xanax, Trazodone, Prozac and other medications she cannot recall.  Over the Counter: SEE MAR.  History of alcohol / drug use?: No history of alcohol / drug abuse                    Sleep: Good  Appetite:  Good  Current Medications: Current Facility-Administered Medications  Medication Dose Route Frequency Provider Last Rate Last Dose  . acetaminophen (TYLENOL) tablet 650 mg  650 mg Oral Q6H PRN Lindon Romp A, NP   650 mg at 06/26/18 1746  . ALPRAZolam (XANAX XR) 24 hr tablet 2 mg  2 mg Oral Daily Sharma Covert, MD   2 mg at 07/01/18 3536  . ALPRAZolam Duanne Moron)  tablet 0.5 mg  0.5 mg Oral BID PRN Cobos, Myer Peer, MD   0.5 mg at 06/30/18 1659  . alum & mag hydroxide-simeth (MAALOX/MYLANTA) 200-200-20 MG/5ML suspension 30 mL  30 mL Oral Q4H PRN Lindon Romp A, NP      . cholecalciferol (VITAMIN D3) tablet 1,000 Units  1,000 Units Oral Daily Sharma Covert, MD   1,000 Units at 07/01/18 [phone]  . colestipol (COLESTID) tablet 2 g  2  g Oral BID Lindon Romp A, NP   2 g at 07/01/18 1203  . dicyclomine (BENTYL) tablet 20 mg  20 mg Oral TID AC & HS Sharma Covert, MD   20 mg at 07/01/18 1203  . FLUoxetine (PROZAC) capsule 80 mg  80 mg Oral Daily Sharma Covert, MD   80 mg at 07/01/18 8850  . levothyroxine (SYNTHROID, LEVOTHROID) tablet 125 mcg  125 mcg Oral Q0600 Lindon Romp A, NP   125 mcg at 07/01/18 2774  . magnesium hydroxide (MILK OF MAGNESIA) suspension 30 mL  30 mL Oral Daily PRN Lindon Romp A, NP      . metoprolol tartrate (LOPRESSOR) tablet 25 mg  25 mg Oral BID Sharma Covert, MD   25 mg at 07/01/18 1287  . oseltamivir (TAMIFLU) capsule 75 mg  75 mg Oral Daily Carlyle Basques, MD   75 mg at 07/01/18 0829  . pantoprazole (PROTONIX) EC tablet 40 mg  40 mg Oral Daily Sharma Covert, MD   40 mg at 07/01/18 8676  . traZODone (DESYREL) tablet 200 mg  200 mg Oral QHS PRN Sharma Covert, MD   200 mg at 06/30/18 2104    Lab Results: No results found for this or any previous visit (from the past 41 hour(s)).  Blood Alcohol level:  No results found for: [facility]  Metabolic Disorder Labs: Lab Results  Component Value Date   HGBA1C 5.6 06/21/2018   MPG 114.02 06/21/2018   MPG 114 06/13/2017   No results found for: PROLACTIN Lab Results  Component Value Date   CHOL 258 (H) 06/21/2018   TRIG 185 (H) 06/21/2018   HDL 50 06/21/2018   CHOLHDL 5.2 06/21/2018   VLDL 37 06/21/2018   LDLCALC 171 (H) 06/21/2018   LDLCALC 172 (H) 03/13/2018    Physical Findings: AIMS: Facial and Oral Movements Muscles of Facial Expression: None,  normal Lips and Perioral Area: None, normal Jaw: None, normal Tongue: None, normal,Extremity Movements Upper (arms, wrists, hands, fingers): None, normal Lower (legs, knees, ankles, toes): None, normal, Trunk Movements Neck, shoulders, hips: None, normal, Overall Severity Severity of abnormal movements (highest score from questions above): None, normal Incapacitation due to abnormal movements: None, normal Patient's awareness of abnormal movements (rate only patient's report): No Awareness, Dental Status Current problems with teeth and/or dentures?: No Does patient usually wear dentures?: No  CIWA:  CIWA-Ar Total: 1 COWS:  COWS Total Score: 1  Musculoskeletal: Strength & Muscle Tone: within normal limits Gait & Station: normal Patient leans: N/A  Psychiatric Specialty Exam: Physical Exam  Nursing note and vitals reviewed. Constitutional: She is oriented to person, place, and time. She appears well-developed and well-nourished.  HENT:  Head: Normocephalic and atraumatic.  Respiratory: Effort normal.  Neurological: She is alert and oriented to person, place, and time.    ROS  Blood pressure 99/83, pulse 68, temperature 98 F (36.7 C), temperature source Oral, resp. rate 16, height 5\' 9"  (1.753 m), weight 104.3 kg, last menstrual period 03/19/2011, SpO2 96 %.Body mass index is 33.97 kg/m.  General Appearance: Casual  Eye Contact:  Good  Speech:  Normal Rate  Volume:  Normal  Mood:  Anxious  Affect:  Congruent  Thought Process:  Coherent and Descriptions of Associations: Intact  Orientation:  Full (Time, Place, and Person)  Thought Content:  Logical and Rumination  Suicidal Thoughts:  No  Homicidal Thoughts:  No  Memory:  Immediate;   Fair Recent;   Fair Remote;  Fair  Judgement:  Intact  Insight:  Fair  Psychomotor Activity:  Increased  Concentration:  Concentration: Fair and Attention Span: Fair  Recall:  [employer] of Knowledge:  Fair  Language:  Fair  Akathisia:   Negative  Handed:  Right  AIMS (if indicated):     Assets:  Communication Skills Desire for Improvement Financial Resources/Insurance Housing Leisure Time Physical Health Resilience Social Support  ADL's:  Intact  Cognition:  WNL  Sleep:  Number of Hours: 6.25     Treatment Plan Summary: Daily contact with patient to assess and evaluate symptoms and progress in treatment, Medication management and Plan : Patient is seen and examined.  Patient is a 43 year old female with the above-stated past psychiatric history who is seen in follow-up.  Patient continues to ruminate on the fact that she has not excepted the death of her son.  We discussed that at length.  We discussed reasonable timeframes by which her grief may improve.  She denies any current suicidal ideation, but she is chronically suicidal.  No change in her current medications.  She has been accepted into the intensive outpatient program, and we will plan on discharge on 2/16 to begin the intensive outpatient program on 2/17. 1.  Continue Xanax XR 2 mg p.o. daily for anxiety. 2.  Continue Xanax 0.5 mg p.o. twice daily as needed anxiety. 3.  Continue colestipol 2 g p.o. twice daily for GI issues. 4.  Continue Bentyl 20 mg p.o. 3 times daily before meals and at bedtime for irritable bowel syndrome. 5. continue fluoxetine 80 mg p.o. daily for mood. 6.  Continue levothyroxine 125 mcg p.o. daily for hypothyroidism. 7.  Continue metoprolol 25 mg p.o. twice daily for blood pressure and tachycardia. 8.  Continue Tamiflu 75 mg p.o. daily for 10 days for influenza. 9.  Continue Protonix 40 mg p.o. daily for GERD. 10. Continue trazodone to 200 mg p.o. nightly as needed insomnia. 11.  Disposition planning-in progress.   Sharma Covert, MD 07/01/2018, 12:08 PM

## 2018-07-01 NOTE — Therapy (Signed)
Occupational Therapy Group Note  Date:  07/01/2018 Time:  12:11 PM  Group Topic/Focus:  Stress Management  Participation Level:  Active  Participation Quality:  Appropriate  Affect:  Depressed and Flat  Cognitive:  Appropriate  Insight: Improving  Engagement in Group:  Engaged  Modes of Intervention:  Activity, Discussion, Education and Socialization  Additional Comments:    S: "I am very stressed over losing my son, I have a lot of anger"  O: Stress management group completed to use as productive coping strategy, to help mitigate maladaptive coping to integrate in functional BADL/IADL. Stress management tool worksheet discussed to educate on unhealthy vs healthy coping skills to manage stress to improve community integration. Coping strategies taught include: relaxation based- deep breathing, counting to 10, taking a 1 minute vacation, acceptance, stress balls, relaxation audio/video, visual/mental imagery. Positive mental attitude- gratitude, acceptance, cognitive reframing, positive self talk, anger management. Pt encouraged to share skills that work for pt vs ones that do not.  A: Pt presents to group with flat/depressed affect, pt engaged and participatory throughout group. She mentions the stressor of losing her son by suicide and having a lot of anger surrounding this. She shares she does not currently have enough coping skills to utilize to manage this, and is very interested to learn more. Pt very receptive of education given, stating she likes the idea of relaxation type coping kills.  P: OT group will be once per week while pt inpatient.  Zenovia Jarred, MSOT, OTR/L Behavioral Health OT/ Acute Relief OT PHP Office: [place] 07/01/2018, 12:11 PM
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
## 2018-07-02 NOTE — Progress Notes (Signed)
Patient ID: Andrea [name], female   DOB: [birth year], 43 y.o.   MRN: [id number] DAR Note:  Pt believes that coming to [facility] has help her cope better with her son's death. Pt explained that talking to the RN has helped to reduce her anxiety and depression to where she no longer feel suicidal. Pt however, still endorse moderate anxiety, depression and some worrying, "I will have to go back to that home to pack my cloths; I hope I don't go back to where I was." Pt remained compliant with her medications. All patient's questions and concerns addressed. Support, encouragement, and safe environment provided. 15-minute safety checks continue.

## 2018-07-02 NOTE — Progress Notes (Signed)
Patient ID: Andrea [name], female   DOB: 1976/02/02, 43 y.o.   MRN: [id number]  EKG completed and provided to primary RN.

## 2018-07-02 NOTE — Progress Notes (Signed)
Patient's outpatient therapist, Debria Garret [phone]) contacted CSW to discuss the patient's tentative discharge. CSW and OP therapist have consent to share the patient's information with each other.   Levada Dy shared that the patient reported to her, that she was discharging Sunday (07/05/2018) in order for her to attend her initial appointment with [facility] on Monday, 07/06/2018 at [place] reports that she is concerned with the patient discharging Sunday, due to the patient's father not securing an alternative housing situation at this time. According to Levada Dy the patient's father will have a new apartment for he and the patient to live in either, Monday (2/17) or Tuesday(2/18). Levada Dy shared that she is concerned that the patient is going to decompensate and commit suicide once she arrives to her home and has to face her deceased son's belongings in order to pack her belongings in order to move into her new apartment. Levada Dy requested for the patient to discharge directly from being inpatient at [facility] to the [facility] on Monday morning to ensure the patient makes it to her initial appointment at [place] requested to speak with the attending physician regarding the patient's discharge. CSW provided the [name]'s contact information to the MD.   Dr. Mallie Darting, MD notified and he reports he will follow up with the patient's therapist.   CSW will continue to follow.     Radonna Ricker, MSW, [place] [facility]  Phone: [phone]

## 2018-07-02 NOTE — Plan of Care (Signed)
D: Patient presents anxious, tearful, depressed. She is hyperverbal and fixated on the loss of her son. She continues to grieve, but states her depression is greatly improved. However, it appears she continues to be greatly distressed from time to time and needs frequent encouragement. She slept well last night. She refused to fill out a self-inventory. Patient denies SI/HI/AVH.  A: Patient checked q15 min, and checks reviewed. Reviewed medication changes with patient and educated on side effects. Educated patient on importance of attending group therapy sessions and educated on several coping skills. Encouarged participation in milieu through recreation therapy and attending meals with peers. Support and encouragement provided. Fluids offered. R: Patient receptive to education on medications, and is medication compliant. Patient contracts for safety on the unit.

## 2018-07-02 NOTE — Plan of Care (Signed)
D: Patient is alert, oriented, pleasant, and cooperative. Endorses passive SI. Denies HI, AVH, and verbally contracts for safety. Patient reports she feels she is "on an emotional roller coaster today" including crying spells. Patient endorses some "social anxiety because there are a lot of new patients" and she misses one of the patient that has been discharged. Patient denies physical symptoms/pain.    A: Medications administered per MD order. Support provided. Patient educated on safety on the unit and medications. Routine safety checks every 15 minutes. Patient stated understanding to tell nurse about any new physical symptoms. Patient understands to tell staff of any needs.     R: No adverse drug reactions noted. Patient verbally contracts for safety. Patient remains safe at this time and will continue to monitor.   Problem: Activity: Goal: Interest or engagement in activities will improve Outcome: Progressing   Problem: Safety: Goal: Periods of time without injury will increase Outcome: Progressing   Patient appears to enjoy interacting in the milieu. Patient remains safe and will continue to monitor.

## 2018-07-03 ENCOUNTER — Other Ambulatory Visit: Payer: Medicare HMO
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
## 2018-07-03 NOTE — Progress Notes (Addendum)
D Pt is observed OOB UAL on the 400 hall today sh tolerates this well. She endorses a tearfu, flat, depressed affect. She wears hospital-issue scrubs, she is seen interacting with her peers as well as the staff.     A SHE completed her daily assessment and on this she wrote she deneid SI  Today and she rated her depression, hopleessness and anxiety " 5/1/6",respectively.      R She got upset today when  ( according to the pt) her out patient counselor obtained the suicde letter (s) her son wrote, saying " I'm going to talk to her....she shouldn't have done that ....".Sup[port offered and safety in place.

## 2018-07-03 NOTE — Plan of Care (Signed)
  Problem: Self-Concept: Goal: Ability to identify factors that promote anxiety will improve Outcome: Progressing   

## 2018-07-03 NOTE — Progress Notes (Signed)
Recreation Therapy Notes  Date:  2.14.19 Time: 0930 Location: 300 Hall Dayroom  Group Topic: Stress Management  Goal Area(s) Addresses:  Patient will identify positive stress management techniques. Patient will identify benefits of using stress management post d/c.  Intervention: Stress Management  Activity :  Meditation.  LRT introduced the stress management technique of meditation.  LRT played a meditation on having a purposeful day.  Patients were to listen and follow along as meditation played.  Education:  Stress Management, Discharge Planning.   Education Outcome: Acknowledges Education  Clinical Observations/Feedback: Pt did not attend group.     Bona Hubbard, LRT/CTRS         Habib Kise A 07/03/2018 12:20 PM 

## 2018-07-04 MED ORDER — ROSUVASTATIN CALCIUM 20 MG PO TABS: ORAL_TABLET | ORAL | 0 refills | Status: DC

## 2018-07-04 MED ORDER — COLESTIPOL HCL 1 G PO TABS: 2.0000 g | ORAL_TABLET | Freq: Two times a day (BID) | ORAL | 0 refills | Status: DC

## 2018-07-04 MED ORDER — VITAMIN D 25 MCG (1000 UNIT) PO TABS: 1000.0000 [IU] | ORAL_TABLET | Freq: Every day | ORAL | 0 refills | Status: DC

## 2018-07-04 MED ORDER — ALPRAZOLAM ER 2 MG PO TB24: 2.0000 mg | ORAL_TABLET | Freq: Every day | ORAL | 0 refills | Status: AC

## 2018-07-04 MED ORDER — ESOMEPRAZOLE MAGNESIUM 40 MG PO CPDR: 40.0000 mg | DELAYED_RELEASE_CAPSULE | Freq: Every day | ORAL | 0 refills | Status: DC

## 2018-07-04 MED ORDER — DICYCLOMINE HCL 20 MG PO TABS: 20.0000 mg | ORAL_TABLET | Freq: Three times a day (TID) | ORAL | 0 refills | Status: DC

## 2018-07-04 MED ORDER — SYNTHROID 125 MCG PO TABS: ORAL_TABLET | ORAL | 0 refills | Status: DC

## 2018-07-04 MED ORDER — FLUOXETINE HCL 40 MG PO CAPS: 80.0000 mg | ORAL_CAPSULE | Freq: Every day | ORAL | 0 refills | Status: DC

## 2018-07-04 MED ORDER — METOPROLOL TARTRATE 25 MG PO TABS: 25.0000 mg | ORAL_TABLET | Freq: Two times a day (BID) | ORAL | 0 refills | Status: DC

## 2018-07-04 MED ORDER — OSELTAMIVIR PHOSPHATE 75 MG PO CAPS: 75.0000 mg | ORAL_CAPSULE | Freq: Every day | ORAL | Status: DC

## 2018-07-04 MED ORDER — ALPRAZOLAM 0.5 MG PO TABS: 0.5000 mg | ORAL_TABLET | Freq: Two times a day (BID) | ORAL | 0 refills | Status: DC | PRN

## 2018-07-04 MED ORDER — TRAZODONE HCL 100 MG PO TABS: 200.0000 mg | ORAL_TABLET | Freq: Every evening | ORAL | 0 refills | Status: DC | PRN

## 2018-07-04 NOTE — Progress Notes (Signed)
  [facility] Adult Case Management Discharge Plan :  Will you be returning to the same living situation after discharge:  No.  See notes about options for living situation At discharge, do you have transportation home?: Yes,  family Do you have the ability to pay for your medications: Yes,  no barriers  Release of information consent forms completed and turned in to Medical Records by CSW.   Patient to Follow up at: Follow-up Information    Chucky May, MD Follow up on 07/13/2018.   Specialty:  Psychiatry Why:  Medication management appointment is 2/24 at 2:15p.  Please bring your current medications and discharge paperwork from this hospitalization.  Contact information: [address] [address] [place] [address] [phone]        BEHAVIORAL HEALTH PARTIAL HOSPITALIZATION PROGRAM Follow up on 07/06/2018.   Specialty:  Behavioral Health Why:  Please attend your assessment on Monday, 2/17 at 9:00a.  Contact information: [place] [id number] [place] [phone]          Next level of care provider has access to [place] and Suicide Prevention discussed: Yes,  with both parents individually     Has patient been referred to the Quitline?: N/A patient is not a smoker  Patient has been referred for addiction treatment: N/A  Maretta Los, LCSW 07/04/2018, 5:13 PM
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
## 2018-07-04 NOTE — Discharge Summary (Signed)
Physician Discharge Summary Note  Patient:  Andrea [name] is an 43 y.o., female MRN:  [id number] DOB:  04-Apr-1976 Patient phone:  [phone] (home)  Patient address:   [address] [address],  Total Time spent with patient: 30 minutes  Date of Admission:  06/20/2018 Date of Discharge: 07/04/2018  Reason for Admission: Suicidality, acute stress reaction, depression  Principal Problem: Severe recurrent major depression without psychotic features [facility]) Discharge Diagnoses: Principal Problem:   Severe recurrent major depression without psychotic features [facility])   Past Psychiatric History: Patient has a history of bipolar disorder, posttraumatic stress disorder, borderline personality disorder and is followed by a therapist locally as well as a psychiatrist (dr Toy Care).  She has several psychiatric hospitalizations in the past.  Unfortunately she found her son status post gunshot wound to the head and his suicide event.  This led to decompensation in this hospitalization.  Past Medical History:  Past Medical History:  Diagnosis Date  . Anxiety   . Arthritis    ruptured lumbar disc-careful with positioning  . Blood dyscrasia    protein s deficiency-no treatment since 2006  . Depression   . GERD (gastroesophageal reflux disease)   . Headache(784.0)   . Hemorrhoids   . High cholesterol   . Hyperlipemia   . Hypertension   . Hypothyroidism   . IBS (irritable bowel syndrome)   . MVP (mitral valve prolapse)    echo per dr Dagmar Hait  . Protein S deficiency ([place]) 2003   dvt/pulmonary embolus-on coumadin for 6 months then off-Sees [facility]  . Pulmonary embolism ([place])   . PVC (premature ventricular contraction)     Past Surgical History:  Procedure Laterality Date  . CESAREAN SECTION  2006  . CHOLECYSTECTOMY N/A 11/03/2014   Procedure: LAPAROSCOPIC CHOLECYSTECTOMY WITH INTRAOPERATIVE CHOLANGIOGRAM;  Surgeon: Georganna Skeans, MD;  Location: [place];  Service: General;   Laterality: N/A;  . LAPAROSCOPIC ASSISTED VAGINAL HYSTERECTOMY  03/27/2011   Procedure: LAPAROSCOPIC ASSISTED VAGINAL HYSTERECTOMY;  Surgeon: Cyril Mourning, MD;  Location: [facility];  Service: Gynecology;  Laterality: N/A;  . SALPINGOOPHORECTOMY  03/27/2011   Procedure: SALPINGO OOPHERECTOMY;  Surgeon: Cyril Mourning, MD;  Location: [facility];  Service: Gynecology;  Laterality: Bilateral;  . TONSILLECTOMY  92  . vaginal reconstructive surgery  2001   Family History:  Family History  Problem Relation Age of Onset  . High blood pressure Mother   . Hyperlipidemia Father        fathers side of family  . High blood pressure Father   . Hypertension Other        entire family on both sides  . Asthma Son   . Asthma Son   . Clotting disorder Maternal Uncle   . Clotting disorder Paternal Grandmother   . Heart disease Maternal Grandfather    Family Psychiatric  History: Major depression and autism in her son.  Son completed suicide by gunshot wound to the head. Social History:  Social History   Substance and Sexual Activity  Alcohol Use Never  . Frequency: Never     Social History   Substance and Sexual Activity  Drug Use No    Social History   Socioeconomic History  . Marital status: Divorced    Spouse name: Not on file  . Number of children: 2  . Years of education: Not on file  . Highest education level: Not on file  Occupational History  . Occupation: nutrition    Employer: [employer]  Social Needs  . Financial resource strain: Not hard at all  . Food insecurity:    Worry: Never true    Inability: Never true  . Transportation needs:    Medical: No    Non-medical: No  Tobacco Use  . Smoking status: Former Smoker    Packs/day: 1.00    Years: 15.00    Pack years: 15.00    Types: Cigarettes    Last attempt to quit: 03/18/2004    Years since quitting: 14.3  . Smokeless tobacco: Never Used  Substance and Sexual Activity  . Alcohol use: Never     Frequency: Never  . Drug use: No  . Sexual activity: Yes  Lifestyle  . Physical activity:    Days per week: 3 days    Minutes per session: 30 min  . Stress: To some extent  Relationships  . Social connections:    Talks on phone: More than three times a week    Gets together: More than three times a week    Attends religious service: Never    Active member of club or organization: No    Attends meetings of clubs or organizations: Never    Relationship status: Divorced  Other Topics Concern  . Not on file  Social History Narrative   Diet: Regular   Caffeine: Yes   Married- divorced, married in 2006   House: Yes, 2 persons   Pets: 1 dog   Current/Past profession: [employer] 2002-2013   Exercise: Yes, walking   Living Will: No    DNR: No    POA/HPOA: No        Hospital Course: Patient was admitted on 07-20-18 after finding her son dead from a suicide event.  The son had used a gunshot wound to the head as well as hanging to end his life.  The patient found her son, and unfortunately decompensated significantly.  On the date of admission she was significantly agitated and histrionic.  She required her standing psychiatric medications as well as Geodon intramuscular injections to assist in calming her.  On admission she was taking Xanax, Prozac.  The Prozac was increased to 80 mg p.o. daily on 06/22/2018.  During the course the hospitalization she also had significant supportive psychotherapy done during the admission.  I admitted the patient on 2/2, and resumed care for her on 06/30/2018.  She had significantly improved when I saw her on 06/30/2018.  She admitted to chronic suicidal thoughts, but no acute suicidality.  We had evolved into a plan of having the patient discharged to the care of her mother and father.  The plan had been to discharge her to live with her mother's until housing arrangements could be made at a different location.  Her father had repaired and painted the room where  her son had committed suicide.  Unfortunately the patient felt as though she was unable to return to that housing, and the family was assisting in finding other housing locations.  They did find an apartment that would be available on 20-Jul-2018.  Again the plan had been for the patient to be discharged to the care of her mother who had felt comfortable watching her until she was able to move in.  Dr. Parke Poisson had spoken to the sheriff's office about the suicide note the son and let.  Apparently it was quite harsh and blaming the patient for leading the son to commit suicide.  At that time it was felt in her  best interest not to disclose this information.  On 07/02/2018 the patient's therapist contacted myself, nursing staff and social work and felt as though the patient was not ready to be discharged.  Further discussion with the patient's family members as well as the patient herself felt as though her suicidal ideation was a chronic thing, but she felt as though she was committed to her one remaining son and her mother and father and that she was safe.  Her therapist contacted the patient on the morning of 07/03/2018.  The therapist disclosed some of the contents of the letter to the patient.  The patient became quite upset over this because there was some degree of blaming the patient for his thoughts to kill himself.  Surprisingly the patient coped with this well.  We discussed the suicide of her son extensively during the course the hospitalization, but for almost an hour on 2/14.  She stated that if anything she felt as though the suicide had decreased her acute sense of harming herself.  She stated that she did not have any acute thoughts to harm herself.  She stated she did not want to hurt her family the way that the suicide had affected her in the rest of the family.  The patient stated that she had assured her older son that she would not harm herself.  Discussion with family members by staff and social work  were assured by father and mother that they felt comfortable with the patient going home.  It was decided on 07/04/2018 that the patient was stable enough to be discharged home.  She is considered at somewhat risk for suicide because of her chronic suicidality, but she is never harmed herself to the point of a life-threatening situation in the past, and her insight and evolution during the course of the hospitalization towards her understanding of her son suicide and her own mental illness was significant.  Physical Findings: AIMS: Facial and Oral Movements Muscles of Facial Expression: None, normal Lips and Perioral Area: None, normal Jaw: None, normal Tongue: None, normal,Extremity Movements Upper (arms, wrists, hands, fingers): None, normal Lower (legs, knees, ankles, toes): None, normal, Trunk Movements Neck, shoulders, hips: None, normal, Overall Severity Severity of abnormal movements (highest score from questions above): None, normal Incapacitation due to abnormal movements: None, normal Patient's awareness of abnormal movements (rate only patient's report): No Awareness, Dental Status Current problems with teeth and/or dentures?: No Does patient usually wear dentures?: No  CIWA:  CIWA-Ar Total: 2 COWS:  COWS Total Score: 1  Musculoskeletal: Strength & Muscle Tone: within normal limits Gait & Station: normal Patient leans: N/A  Psychiatric Specialty Exam: Physical Exam  Nursing note and vitals reviewed. Constitutional: She is oriented to person, place, and time. She appears well-developed and well-nourished.  HENT:  Head: Normocephalic and atraumatic.  Respiratory: Effort normal.  Neurological: She is alert and oriented to person, place, and time.    ROS  Blood pressure (!) 112/92, pulse 62, temperature 97.8 F (36.6 C), temperature source Oral, resp. rate 20, height 5\' 9"  (1.753 m), weight 104.3 kg, last menstrual period 03/19/2011, SpO2 96 %.Body mass index is 33.97 kg/m.   General Appearance: Casual  Eye Contact:  Fair  Speech:  Normal Rate  Volume:  Normal  Mood:  Anxious  Affect:  Congruent  Thought Process:  Coherent and Descriptions of Associations: Intact  Orientation:  Full (Time, Place, and Person)  Thought Content:  Logical  Suicidal Thoughts:  No  Homicidal Thoughts:  No  Memory:  Immediate;   Fair Recent;   Fair Remote;   Fair  Judgement:  Intact  Insight:  Fair  Psychomotor Activity:  Normal  Concentration:  Concentration: Fair and Attention Span: Fair  Recall:  [employer] of Knowledge:  Fair  Language:  Fair  Akathisia:  Negative  Handed:  Right  AIMS (if indicated):     Assets:  Communication Skills Desire for Improvement Financial Resources/Insurance Housing Leisure Time Physical Health Resilience Social Support  ADL's:  Intact  Cognition:  WNL  Sleep:  Number of Hours: 5.75        Has this patient used any form of tobacco in the last 30 days? (Cigarettes, Smokeless Tobacco, Cigars, and/or Pipes) Yes, No  Blood Alcohol level:  No results found for: [facility]  Metabolic Disorder Labs:  Lab Results  Component Value Date   HGBA1C 5.6 06/21/2018   MPG 114.02 06/21/2018   MPG 114 06/13/2017   No results found for: PROLACTIN Lab Results  Component Value Date   CHOL 258 (H) 06/21/2018   TRIG 185 (H) 06/21/2018   HDL 50 06/21/2018   CHOLHDL 5.2 06/21/2018   VLDL 37 06/21/2018   LDLCALC 171 (H) 06/21/2018   LDLCALC 172 (H) 03/13/2018    See Psychiatric Specialty Exam and Suicide Risk Assessment completed by Attending Physician prior to discharge.  Discharge destination:  Home  Is patient on multiple antipsychotic therapies at discharge:  No   Has Patient had three or more failed trials of antipsychotic monotherapy by history:  No  Recommended Plan for Multiple Antipsychotic Therapies: NA   Allergies as of 07/04/2018   No Known Allergies     Medication List    STOP taking these medications   ezetimibe 10  MG tablet Commonly known as:  ZETIA   FLAGYL 500 MG tablet Generic drug:  metroNIDAZOLE   lamoTRIgine 150 MG tablet Commonly known as:  LAMICTAL   LATUDA 20 MG Tabs tablet Generic drug:  lurasidone   losartan 100 MG tablet Commonly known as:  COZAAR   oxyCODONE-acetaminophen 7.5-325 MG tablet Commonly known as:  PERCOCET   valACYclovir 1000 MG tablet Commonly known as:  VALTREX   Vitamin D (Ergocalciferol) 1.25 MG (50000 UT) Caps capsule Commonly known as:  DRISDOL     TAKE these medications     Indication  ALPRAZolam 0.5 MG tablet Commonly known as:  XANAX Take 1 tablet (0.5 mg total) by mouth 2 (two) times daily as needed for anxiety. What changed:    medication strength  how much to take  reasons to take this  Indication:  Feeling Anxious, Panic Disorder   ALPRAZolam 2 MG 24 hr tablet Commonly known as:  XANAX XR Take 1 tablet (2 mg total) by mouth daily. For anxiety Start taking on:  July 05, 2018 What changed:    when to take this  additional instructions  Indication:  Panic Disorder, Anxiety   cholecalciferol 25 MCG (1000 UT) tablet Commonly known as:  VITAMIN D3 Take 1 tablet (1,000 Units total) by mouth daily. For bone health Start taking on:  July 05, 2018  Indication:  Bone health   colestipol 1 g tablet Commonly known as:  COLESTID Take 2 tablets (2 g total) by mouth 2 (two) times daily. For high cholesterol What changed:  additional instructions  Indication:  High Amount of Cholesterol in the Blood   dicyclomine 20 MG tablet Commonly known as:  BENTYL Take 1 tablet (20 mg total)  by mouth 4 (four) times daily -  before meals and at bedtime. For bowel spasms  Indication:  Irritable Bowel Syndrome   esomeprazole 40 MG capsule Commonly known as:  NEXIUM Take 1 capsule (40 mg total) by mouth daily. For acid reflux What changed:  additional instructions  Indication:  Gastroesophageal Reflux Disease   FLUoxetine 40 MG  capsule Commonly known as:  PROZAC Take 2 capsules (80 mg total) by mouth daily. For depression What changed:  additional instructions  Indication:  Major Depressive Disorder   metoprolol tartrate 25 MG tablet Commonly known as:  LOPRESSOR Take 1 tablet (25 mg total) by mouth 2 (two) times daily. For hypertension What changed:  additional instructions  Indication:  High Blood Pressure Disorder   oseltamivir 75 MG capsule Commonly known as:  TAMIFLU Take 1 capsule (75 mg total) by mouth daily. For Flu Start taking on:  July 05, 2018  Indication:  [place] Flu   rosuvastatin 20 MG tablet Commonly known as:  CRESTOR Take one tablet by mouth once daily: For high cholesterol What changed:  additional instructions  Indication:  Inherited Homozygous Hypercholesterolemia, Elevation of Both Cholesterol and Triglycerides in Blood   SYNTHROID 125 MCG tablet Generic drug:  levothyroxine TAKE 1 TABLET(125 MCG) BY MOUTH DAILY BEFORE BREAKFAST: For thyroid hormone replacement What changed:  See the new instructions.  Indication:  Underactive Thyroid   traZODone 100 MG tablet Commonly known as:  DESYREL Take 2 tablets (200 mg total) by mouth at bedtime as needed for sleep. What changed:    medication strength  how much to take  when to take this  reasons to take this  Indication:  [place]    Chucky May, MD Follow up on 07/13/2018.   Specialty:  Psychiatry Why:  Medication management appointment is 2/24 at 2:15p.  Please bring your current medications and discharge paperwork from this hospitalization.  Contact information: [address] [address] [place]  [address] [phone]        BEHAVIORAL HEALTH PARTIAL HOSPITALIZATION PROGRAM Follow up on 07/06/2018.   Specialty:  Behavioral Health Why:  Please attend your assessment on Monday, 2/17 at 9:00a.  Contact information: [place] [id number] [place] [phone]          Follow-up recommendations:  Activity:  ad lib  Comments:  None  Signed: Sharma Covert, MD 07/04/2018, 11:33 AM

## 2018-07-04 NOTE — Plan of Care (Signed)
  Problem: Education: Goal: Ability to state activities that reduce stress will improve Outcome: Progressing   Problem: Coping: Goal: Ability to identify and develop effective coping behavior will improve Outcome: Progressing   Problem: Self-Concept: Goal: Ability to identify factors that promote anxiety will improve Outcome: Progressing  DAR NOTE: Pt present with flat affect and depressed mood in the unit. Pt has been present in the day room, attended the wrap group. Pt stated visitation with her son went well, but not happy with her therapist who went behind her back and got the letters of her late son from the police. Complained of increased anxiety but declined 0.5 mg xanax stating will do nothing for her. Pt took her scheduled meds plus trazodone before bed.  Pt's safety ensured with 15 minute and environmental checks. Pt currently denies SI/HI and A/V hallucinations. Pt verbally agrees to seek staff if SI/HI or A/VH occurs and to consult with staff before acting on these thoughts. Will continue POC.

## 2018-07-04 NOTE — BHH Group Notes (Signed)
LCSW Group Therapy Note  07/04/2018    10:00-11:00am   Type of Therapy and Topic:  Group Therapy: Shame and Its Impact   Participation Level:  Active   Description of Group:   In this group, patients shared and discussed that guilt is the negative feeling we have when we've done something wrong, while shame is the negative feeling we have simply about "being."  In listening to each other share, patients learned that humans are all imperfect and that there is no shame in this.  We discussed how it could positively impact our wellbeing by accepting our faults as part of our being that can be worked on but does not have to shame [place].    Therapeutic Goals: 1. Patients will learn the difference between guilt and shame. 2. Patients will share their current shame feelings and how this has impacted their current lives. 3. Patients will explore possible ways to think differently about those parts of their bodies, feelings, and lives about which they do have shame. 4. Patients will learn that shame is universal, and that keeping our shame a secret actually increases its hold on [place].  Summary of Patient Progress:  The patient shared that she feels shame about spending so much time with her younger son who recently committed suicide and ignoring her older son during this time.  This is of concern because she now needs to reestablish a relationship with her older son.  She reiterated repeatedly that she does not feel guilty about her younger son's death or how she chose to raise him, saying he was who he was and she would not have changed him.  She gave positive feedback to others and expressed no fears about upcoming discharge..  Therapeutic Modalities:   Cognitive Behavioral Therapy Motivation Interviewing  Maretta Los  .

## 2018-07-04 NOTE — BHH Counselor (Signed)
CSW met with patient at bedside for approximately 40 minutes. Patient verbalizes readiness for discharge this afternoon, she denies SI.   Andrea [name] extensively discussed the situation yesterday in which the contents of her son's suicide notes were shared with her by her (former) therapist, Andrea [name]. Patient shares that she now views suicide in a completely different light, as she received "no answers" from her son's suicide note. She shared she views suicide as a "selfish act" and spoke of how grief and loss have impacted her family and loved ones. CSW and patient processed feelings regarding her son's death by suicide; CSW provided reflective listening, psycho-education, and validated feelings of anger and sadness.   Patient shared that there were three suicide notes and that the last note concluded with "fuck you, mom." She reports confusion and shares that she fully supported her son when he came out as gay to her and that she tried to protect him from his father's family. Patient speculates that "the final straw" for her son's suicide was his paternal grandparents telling the son he could not join the army if he was openly gay.  Patient spoke at length regarding how she felt her therapist, Andrea [name] acted inappropriately, unethically, and over-stepped boundaries. She reports that she felt "violated" that her therapist used her position and credentials to obtain the suicide notes from the sheriff. Patient does not wish to have further contact with Andrea [name] or [name]. Patient requested CSW remove therapist from her aftercare and patient does not want a discharge summary or any additional information released.    CSW asked patient to review her plan for the next week. Patient reports she is being picked up by her mother at 1pm. Afterward, at 2pm, patient and mother are meeting patient's father with patient's dog at a local park. Patient explains that if her mother is not  agreeable to the patient bringing her dog to her mother's home, she will leave with her father and go to an extended stay hotel with her father and the dog until they can move next week. Patient is unwilling to stay at her mother's house without her dog. CSW discussed the dog situation with both mother and father this morning. Father is aware of and agreeable to the back up plan of patient leaving with him and the dog and going to an extended stay hotel until moving day. Patient reports she is aware this is a potential issue and reports her mother has "always been a fake bitch."  Psychiatry informed.  Patient reports she won't drive for the next two weeks. She reports looking forward to starting PHP on Monday. She has an outstanding appointment with her psychiatrist and she will consult her psychiatrist (of 20 years) to refer her to a new outpatient therapist to meet with after she completes PHP. Patient also reports she will attend groups through [facility].   Andrea Acre, LCSW-A Clinical Social Worker

## 2018-07-04 NOTE — Progress Notes (Signed)
MD completed discharge order and pt completed daily assessment. On this,pt wrote she denied SI today and she rated her depression, hopelessness and anxeity " 2/2/4", respectively. DC instructions were given to pt by this writer, she was given cc of instrucitons ( SRA, AVS/ SSP and transition record), respectively. Pt stated she understood. Pt stated " Im probably going to have to call and change my appt with Dr Toy Care because I don't want to miss any of my out patient therapy sessions". Pt thanked Probation officer " for all the help and support this entire hospital has been to me during this time in my family's life. I will forever be grateful". Pt was given all bleongings in her locker and escorted to [place] where she met her mother whom she will stay with tonight.

## 2018-07-04 NOTE — [facility] Suicide Risk Assessment (Addendum)
[facility] INPATIENT:  Family/Significant Other Suicide Prevention Education  Suicide Prevention Education:  Education Completed; mother, Andrea [name], [phone] has been identified by the patient as the family member/significant other with whom the patient will be residing, and identified as the person(s) who will aid the patient in the event of a mental health crisis (suicidal ideations/suicide attempt).  With written consent from the patient, the family member/significant other has been provided the following suicide prevention education, prior to the and/or following the discharge of the patient.  The suicide prevention education provided includes the following:  Suicide risk factors  Suicide prevention and interventions  National Suicide Hotline telephone number  [facility] assessment telephone number  [facility] Emergency Assistance [place] and/or Residential Mobile Crisis Unit telephone number  Request made of family/significant other to:  Remove weapons (e.g., guns, rifles, knives), all items previously/currently identified as safety concern.    Remove drugs/medications (over-the-counter, prescriptions, illicit drugs), all items previously/currently identified as a safety concern.  The family member/significant other verbalizes understanding of the suicide prevention education information provided.  The family member/significant other agrees to remove the items of safety concern listed above.  Mother, Andrea [name], confirms her plan to pick up the patient at 1pm. Mother states patient will not be left alone and that the patient will sleep in the mother's bedroom and mother will sleep on the sofa in the livingroom. Mother plans to drive patient to her appointments, as mother does not feel comfortable with patient driving at this time. CSW agreed this is a reasonable concern and it will be best for patient's mother to drive the patient to and from [facility] appointments  over the next few weeks.   Mother shares some concerns for patient's discharge. Patient will be returning to the home in which her son died tomorrow 07/19/22) to gather some of her personal possessions. Per mother, the patient is adamant and cannot be talked out of this. Per mother, the plan was for patient's outpatient therapist, Andrea [name], to accompany patient and her parents to the home tomorrow. However, due to actions and behaviors that the patient reportedly considered to be "unethical," mother states the patient told her this morning that the patient does not want to continue seeing therapist Andrea [name] "ever again." Mother reports that patient said Andrea [name] told her that her did not want to "be responsible for her," mother says this is not true.  Mother is concerned about patient's dog. Patient reportedly has a hyperactive small dog named "Rocky." The plan has been for patient to stay in her mother's home for approximately one week while the patient's father finds a new home and coordinates the move. The dog is not welcome in the mother's home and mother states that the dog barks all day and jumps on people and having the dog in the home would not make for a calm home environment. Patient reportedly called her mother early this morning, asking again for the dog to come to her mother's home. Mother and patient plan to meet patient's father and the dog at a local park today. Mother is concerned that patient will become upset when it is time to go and the patient will then decide to go home with her father and the dog.   CSW to speak with patient prior to discharge regarding a calm home environment and transition from [facility] to staying with family.   Mother will arrive at 1pm for discharge. Mother will take patient to her  PHP appointment on Monday.   Joellen Jersey 07/04/2018, 8:50 AM

## 2018-07-04 NOTE — [facility] Suicide Risk Assessment (Signed)
[facility] INPATIENT:  Family/Significant Other Suicide Prevention Education  Suicide Prevention Education:  Education Completed; father, [name] Devera [phone]) has been identified by the patient as the family member/significant other with whom the patient will be residing, and identified as the person(s) who will aid the patient in the event of a mental health crisis (suicidal ideations/suicide attempt).  With written consent from the patient, the family member/significant other has been provided the following suicide prevention education, prior to the and/or following the discharge of the patient.  The suicide prevention education provided includes the following:  Suicide risk factors  Suicide prevention and interventions  National Suicide Hotline telephone number  [facility] assessment telephone number  [facility] Emergency Assistance [place] and/or Residential Mobile Crisis Unit telephone number  Request made of family/significant other to:  Remove weapons (e.g., guns, rifles, knives), all items previously/currently identified as safety concern.    Remove drugs/medications (over-the-counter, prescriptions, illicit drugs), all items previously/currently identified as a safety concern.  The family member/significant other verbalizes understanding of the suicide prevention education information provided.  The family member/significant other agrees to remove the items of safety concern listed above.  Patient's father, [name], confirms plan for patient to discharge today and be picked up by her mother at 1pm. Father reports that he and the patient will likely stay in an extended stay hotel tomorrow until next weekend, when he anticipates their move to a new home. Father reports he has to work Thursday and Friday and that the patient will be at [facility] and then will go to her mother's home until he gets off work at [employer]. Father states patient "will not be left alone this  week" and "she will stay in that house again" referring to the home in which patient's son died.   Father confirms plan to meet patient's mother at a park with the patient's dog at 2pm. CSW addressed mother's concerns of patient wanting to be with the dog and the dog not being welcome in mother's home. Father states "I think she will change her mind. I think she will see Jahmiya with the dog and she will change her mind." CSW explained that mother stated it is not a good plan for the dog to come home with her and mother is worried about patient returning to the same home with her father. Father reiterates that the patient will not stay in the home in which her son died and repeated "I think she will change her mind about the dog."  Father agreeable to discharge today, states no concerns.   Joellen Jersey 07/04/2018, 9:21 AM

## 2018-07-06 ENCOUNTER — Encounter (HOSPITAL_COMMUNITY): Payer: Self-pay

## 2018-07-06 ENCOUNTER — Other Ambulatory Visit: Payer: Self-pay

## 2018-07-06 ENCOUNTER — Other Ambulatory Visit (HOSPITAL_COMMUNITY): Payer: Medicare HMO | Admitting: Occupational Therapy

## 2018-07-06 ENCOUNTER — Other Ambulatory Visit (HOSPITAL_COMMUNITY): Payer: Medicare HMO | Attending: Psychiatry | Admitting: Licensed Clinical Social Worker

## 2018-07-06 ENCOUNTER — Encounter (HOSPITAL_COMMUNITY): Payer: Self-pay | Admitting: Professional

## 2018-07-06 VITALS — BP 124/82 | HR 70 | Ht 68.5 in | Wt 235.0 lb

## 2018-07-06 DIAGNOSIS — E039 Hypothyroidism, unspecified: Secondary | ICD-10-CM | POA: Diagnosis not present

## 2018-07-06 DIAGNOSIS — R4589 Other symptoms and signs involving emotional state: Secondary | ICD-10-CM

## 2018-07-06 DIAGNOSIS — E7800 Pure hypercholesterolemia, unspecified: Secondary | ICD-10-CM | POA: Insufficient documentation

## 2018-07-06 DIAGNOSIS — R45851 Suicidal ideations: Secondary | ICD-10-CM | POA: Diagnosis not present

## 2018-07-06 DIAGNOSIS — F4310 Post-traumatic stress disorder, unspecified: Secondary | ICD-10-CM | POA: Insufficient documentation

## 2018-07-06 DIAGNOSIS — M1990 Unspecified osteoarthritis, unspecified site: Secondary | ICD-10-CM | POA: Diagnosis not present

## 2018-07-06 DIAGNOSIS — I10 Essential (primary) hypertension: Secondary | ICD-10-CM | POA: Diagnosis not present

## 2018-07-06 DIAGNOSIS — R69 Illness, unspecified: Secondary | ICD-10-CM | POA: Diagnosis not present

## 2018-07-06 DIAGNOSIS — F332 Major depressive disorder, recurrent severe without psychotic features: Secondary | ICD-10-CM

## 2018-07-06 DIAGNOSIS — I341 Nonrheumatic mitral (valve) prolapse: Secondary | ICD-10-CM | POA: Insufficient documentation

## 2018-07-06 DIAGNOSIS — K589 Irritable bowel syndrome without diarrhea: Secondary | ICD-10-CM | POA: Insufficient documentation

## 2018-07-06 DIAGNOSIS — F329 Major depressive disorder, single episode, unspecified: Secondary | ICD-10-CM | POA: Insufficient documentation

## 2018-07-06 DIAGNOSIS — Z86711 Personal history of pulmonary embolism: Secondary | ICD-10-CM | POA: Diagnosis not present

## 2018-07-06 DIAGNOSIS — Z7982 Long term (current) use of aspirin: Secondary | ICD-10-CM | POA: Diagnosis not present

## 2018-07-06 DIAGNOSIS — Z79899 Other long term (current) drug therapy: Secondary | ICD-10-CM | POA: Insufficient documentation

## 2018-07-06 DIAGNOSIS — K219 Gastro-esophageal reflux disease without esophagitis: Secondary | ICD-10-CM | POA: Insufficient documentation

## 2018-07-06 DIAGNOSIS — Z86718 Personal history of other venous thrombosis and embolism: Secondary | ICD-10-CM | POA: Diagnosis not present

## 2018-07-06 DIAGNOSIS — F070 Personality change due to known physiological condition: Secondary | ICD-10-CM

## 2018-07-06 DIAGNOSIS — E785 Hyperlipidemia, unspecified: Secondary | ICD-10-CM | POA: Diagnosis not present

## 2018-07-06 NOTE — Therapy (Signed)
Andrea [name], [place], [address] Phone: [phone]   Fax:  [phone]  Occupational Therapy Evaluation  Patient Details  Name: Andrea [name] MRN: [id number] Date of Birth: [birth year] Referring Provider (OT): Ricky Ala, NP   Encounter Date: 07/06/2018  OT End of Session - 07/06/18 1106    Visit Number  1    Number of Visits  16    Date for OT Re-Evaluation  08/03/18    Authorization Type  [employer] medicare    Activity Tolerance  Patient tolerated treatment well    Behavior During Therapy  [facility] for tasks assessed/performed       Past Medical History:  Diagnosis Date  . Anxiety   . Arthritis    ruptured lumbar disc-careful with positioning  . Blood dyscrasia    protein s deficiency-no treatment since 2006  . Depression   . GERD (gastroesophageal reflux disease)   . Headache(784.0)   . Hemorrhoids   . High cholesterol   . Hyperlipemia   . Hypertension   . Hypothyroidism   . IBS (irritable bowel syndrome)   . MVP (mitral valve prolapse)    echo per dr Dagmar Hait  . Protein S deficiency ([place]) 2003   dvt/pulmonary embolus-on coumadin for 6 months then off-Sees [facility]  . Pulmonary embolism ([place])   . PVC (premature ventricular contraction)     Past Surgical History:  Procedure Laterality Date  . CESAREAN SECTION  2006  . CHOLECYSTECTOMY N/A 11/03/2014   Procedure: LAPAROSCOPIC CHOLECYSTECTOMY WITH INTRAOPERATIVE CHOLANGIOGRAM;  Surgeon: Georganna Skeans, MD;  Location: [facility];  Service: General;  Laterality: N/A;  . LAPAROSCOPIC ASSISTED VAGINAL HYSTERECTOMY  03/27/2011   Procedure: LAPAROSCOPIC ASSISTED VAGINAL HYSTERECTOMY;  Surgeon: Cyril Mourning, MD;  Location: [facility];  Service: Gynecology;  Laterality: N/A;  . SALPINGOOPHORECTOMY  03/27/2011   Procedure: SALPINGO OOPHERECTOMY;  Surgeon: Cyril Mourning, MD;  Location: [facility];  Service: Gynecology;  Laterality: Bilateral;  .  TONSILLECTOMY  89  . vaginal reconstructive surgery  2001    There were no vitals filed for this visit.  Subjective Assessment - 07/06/18 1103    Currently in Pain?  No/denies        [facility] OT Assessment - 07/06/18 0001      Assessment   Medical Diagnosis  Major Depressive disorder, recurrent severe; Post traumatic stress disorder    Referring Provider (OT)  Ricky Ala, NP    Onset Date/Surgical Date  07/06/18      Precautions   Precautions  None      Restrictions   Weight Bearing Restrictions  No      Balance Screen   Has the patient fallen in the past 6 months  No    Has the patient had a decrease in activity level because of a fear of falling?   No    Is the patient reluctant to leave their home because of a fear of falling?   No        OT assessment: OCAIRS  Diagnosis: Major depressive disorder, current episode severe; Post traumatic stress disorder  Past medical history/referral information: Pt [facility] to [facility] after an inpatient psychiatric stay (pt was d/c 2 days prior to this date). Pt had found 79 year old son after he had completed suicide, and began to develop SI with a plan after this event. Pt has history of trauma and mental health conditions prior to this incident.   Subjective:  Pt presents to eval well groomed and casually dressed, flat affect and occasionally tangential. In conversation pt continuously circles back to the surroundings of her son's death, without redirection.  Living situation: Pt is currently staying in a hotel, and has plans to move into a new apartment complex with her father. She is moving out of the home where her son committed suicide, for she does not believe that she can remain in that home.  ADLs/IADLs: Pt reports hygiene at baseline, appetite as decreased. She presents with a decrease in motivation to engage in usual BADL/IADL activities, and shares she needs financial assistance from her father to maintain her life.  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 She mentions the possibility of a part time job in the future.  Leisure: Pt shares that her children are her leisure activities, has difficulty identifying areas in specific relation to herself.  Social support: Shares her father and eldest son (75). Pt also mentions her mother, but shares she is angry with her right now because "she is acting fake and overly supportive when she never used to do that"  Struggles: emotional regulation, routine management, role engagement   [place] Summary of Client Scores:  FACILITATES PARTICIPATION IN [place] RESTRICTS [place]                   X Recent role loss, no current clear role engagement  HABITS                   X No current routine or structure, considering pt is in between housing situations and recent d/c from hospital.  [place]                  X  Identifies being a good mom, then fixates on death of son  VALUES                  X  Loosely identified  INTERESTS                  X  Loosely mentions, focused around what her and her son used to do  SKILLS                  X  Difficult concentrating and recall  SHORT TERM GOALS                  X  Mentions beginning to set in journal with assistance, but not yet followed through  [place]                  X  Mentions beginning to set in journal with assistance, but not yet followed through  [place]                   X Fixated on death  PHYSICAL ENVIRONMENT                   X Needing full support to manage at this time, not driving  SOCIAL ENVIRONMENT                  X  Isolated, does not become social without support  READINESS FOR CHANGE  X  Difficult adaptation     Need for Occupational Therapy:  4 Shows positive  occupational participation, no need for OT.   3 Need for minimal intervention/consultative participation     X 2 Need for OT intervention indicated to restore/improve participation   1 Need for extensive OT intervention indicated to improve participation.  Referral for follow up services also recommended.    Assessment:  Patient demonstrates behavior that inhibits participation in occupation.  Patient will benefit from occupational therapy intervention in order to improve time management, financial management, stress management, job readiness skills, social skills, and health management skills in preparation to return to full time community living and to be a productive community member.    Plan:  Patient will participate in skilled occupational therapy sessions individually or in a group setting to improve coping skills, psychosocial skills, and emotional skills required to return to prior level of function.  Treatment will be 4 times per week for 4 weeks.     OT TREATMENT  S: "I have a bedtime routine, but I rarely implement it"  O: Pt educated on sleep hygiene as it pertains to daily life/routines this date. Education given on appropriate sleep routines, sleep disorders, detriments of too much/too little sleep with encouraged feedback of personal Experiences. Sleep hygiene handout given for pt to choose new areas for implementation in BADL routine. Further education given on relaxation techniques to implement before bed. Pt asked to identify one STG in relation to sleep hygiene to create better daily sleep habits.   A: Pt presents to group with flat affect, engaged and participatory throughout session, often times mentioning son who has passed. Pt in understanding of sleep hygiene education given. She shares she has been sleeping better and not needing her PRN sleeping meds since she has been home to sleep with her dog. She has committed to using sleep tracking apps and decreasing her screen  time before bed this date.  P: Pt provided with skills to increase sleep hygiene habits into daily routine. OT will continue to follow up with communication skills for successful implementation into daily life.                 OT Short Term Goals - 07/06/18 1109      OT SHORT TERM GOAL #1   Title  Pt will be educated on strategies to improve psychosocial skills needed to participate fully in all daily, work, and leisure activities    Time  4    Period  Weeks    Status  New    Target Date  08/03/18      OT SHORT TERM GOAL #2   Title  Pt will apply psychosocial skills and coping mechanisms to daily activities in order to function independently and reintegrate into community dwelling    Time  4    Period  Weeks    Status  New    Target Date  08/03/18      OT SHORT TERM GOAL #3   Title  Pt will engage in goal setting to improve functional BADL/IADL routine upon reintegrating into community    Time  4    Period  Weeks    Status  New    Target Date  08/03/18      OT SHORT TERM GOAL #4   Title  Pt will create and implement functional BADL/IADL routine upon reintegrating into community for increased engagement in all daily, work, and leisure activities  Time  4    Period  Weeks    Status  New    Target Date  08/03/18      OT SHORT TERM GOAL #5   Title  Pt will choose and/or engage in 1-3 socially engaging leisure activities to improve social participation upon reintegrating into community    Time  4    Period  Weeks    Status  New    Target Date  08/03/18               Plan - 07/06/18 1107    Occupational performance deficits (Please refer to evaluation for details):  ADL's;IADL's;Rest and Sleep;Work;Leisure;Social Participation    Rehab Potential  Good    OT Frequency  4x / week    OT Duration  4 weeks    OT Treatment/Interventions  Coping strategies training;Psychosocial skills training;Self-care/ADL training;Other (comment)   community reintegration    Consulted and Agree with Plan of Care  Patient       Patient will benefit from skilled therapeutic intervention in order to improve the following deficits and impairments:  Decreased coping skills, Decreased psychosocial skills, Other (comment)(decreased ability to engage in BADL and reintegrate into community)  Visit Diagnosis: Organic personality disorder  Difficulty coping    Problem List Patient Active Problem List   Diagnosis Date Noted  . Severe recurrent major depression without psychotic features ([place]) 06/20/2018  . Hyperlipidemia LDL goal <100 02/21/2017  . High risk medication use 02/21/2017  . Prediabetes 05/22/2016  . Allergic rhinitis due to pollen 07/19/2015  . Hypertension 02/01/2015  . Dyspnea 10/22/2012  . Diarrhea 09/16/2012  . Nausea alone 09/16/2012  . Abdominal pain, other specified site 09/16/2012  . ANAL FISSURE 04/11/2010  . CANDIDIASIS, VAGINAL 11/25/2008  . HEMORRHOIDS-EXTERNAL 11/25/2008  . RECTAL BLEEDING 11/25/2008  . ABDOMINAL PAIN-LUQ 11/25/2008  . NONSPECIFIC ABN FINDING RAD & OTH EXAM GI TRACT 11/25/2008  . Hypothyroidism 09/14/2007  . GERD 09/14/2007  . IBS 09/14/2007  . TONSILLECTOMY, HX OF 09/14/2007   Zenovia Jarred, MSOT, OTR/L Behavioral Health OT/ Acute Relief OT PHP Office: [phone]  Zenovia Jarred 07/06/2018, 11:17 AM  [facility] HOSPITALIZATION PROGRAM [place], [place], [address] Phone: [phone]   Fax:  [phone]  Name: HILLARIE HARRIGAN MRN: [id number] Date of Birth: 1975-12-11
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
## 2018-07-07 ENCOUNTER — Encounter (HOSPITAL_COMMUNITY): Payer: Self-pay

## 2018-07-07 ENCOUNTER — Ambulatory Visit: Payer: Medicare HMO | Admitting: Nurse Practitioner

## 2018-07-07 ENCOUNTER — Other Ambulatory Visit (HOSPITAL_COMMUNITY): Payer: Medicare HMO | Admitting: Licensed Clinical Social Worker

## 2018-07-07 ENCOUNTER — Other Ambulatory Visit (HOSPITAL_COMMUNITY): Payer: Medicare HMO | Admitting: Occupational Therapy

## 2018-07-07 DIAGNOSIS — F332 Major depressive disorder, recurrent severe without psychotic features: Secondary | ICD-10-CM

## 2018-07-07 DIAGNOSIS — F329 Major depressive disorder, single episode, unspecified: Secondary | ICD-10-CM | POA: Diagnosis not present

## 2018-07-07 DIAGNOSIS — F070 Personality change due to known physiological condition: Secondary | ICD-10-CM

## 2018-07-07 DIAGNOSIS — R4589 Other symptoms and signs involving emotional state: Secondary | ICD-10-CM

## 2018-07-07 DIAGNOSIS — F4310 Post-traumatic stress disorder, unspecified: Secondary | ICD-10-CM

## 2018-07-07 NOTE — Progress Notes (Signed)
[facility] Behavioral Health Partial hospitalization initial assessment Note  Andrea [name] [id number] 43 y.o.  07/07/2018 12:15 PM  Chief Complaint:  I was discharged from behavioral health center.  History of Present Illness:  Andrea [name] is 43 year old Caucasian, divorced female who is recently discharged from behavioral health center after having suicidal thoughts and plan to shoot herself or taking overdose.  Patient found her 19 year old son deceased from shooting/hanging by his own hand on January 30.  Patient told it was shocking for her to see him to see his.  She tried to CPR him but apparently he was already dead.  She saw blood, brain out of his head and recall whole thing was very graphic.  Her 33 year old son was autistic, seeing Andrea [name] for medication.  Patient was doing home schooling on him.  Patient do not recall anything prior to his suicide made him to do that.  She told that he undergo her son came out gay and apparently her son's father did not like that.  Apparently her son left a suicidal note and police is still investigating the suicide.  Her son's father was in jail because of drug charges and he was bailed out to attend the funeral.  Patient was struggling with the loss of her son and on February 2 she was admitted to behavioral health center.  In the hospital her Lamictal, Latuda and Wellbutrin was discontinued.  Her Prozac was continued along with Xanax.  She is also taking trazodone.  She still struggle with nightmares, flashback and having poor sleep.  Since that incident she has not slept on the same house.  She moved in a hotel and now she is trying to find a new place.  She admitted upon admission to behavioral health center she was very depressed and having plan to kill herself but now she has a different prospect about suicide.  She does not want her older son, father and mother to go through this.  She wants to get better.  She feels that she has responsibility  of her father who lives with patient.  Patient is on disability.  She still endorse thoughts but no paranoia or any active suicidal thoughts.  She feels sad, disappointed and is still in shock however she had a positive outcome for her life and she wants to be there for her family members.  She was pleased that her older son came to visit her in the hospital.  She had a good support from her family including father mother and older son.  She feel the current medicine working.  She like to continue program.  Patient denies drinking or using any illegal substances.  Appetite is okay.  Suicidal Ideation: Passive and fleeting thoughts but no plan or any intent. Plan Formed: No Patient has means to carry out plan: No  Homicidal Ideation: No Plan Formed: No Patient has means to carry out plan: No  Family History; Lives with her father.  She had another 34 year old son who lives by himself.  Education and Work History; Patient is on disability.  Substance Abuse History; Denies drug use.  Past Psychiatric History/Hospitalization(s) History of cutting herself.  Diagnosed with borderline personality disorder, Polar disorder and posttraumatic stress disorder.  Has been seeing Dr. Toy [name] for a while.  She has several psychiatric hospitalization.  She is a therapist who she see on a regular basis.  Had tried multiple psychiatric medication but claims to be sensitive.  She recall taking Abilify and recently [place], Lamictal,  Wellbutrin.  Review of Systems: Psychiatric: Agitation: No Hallucination: No Depressed Mood: Yes Insomnia: Sleeping few hours Hypersomnia: No Altered Concentration: No Feels Worthless: No Grandiose Ideas: No Belief In Special Powers: No New/Increased Substance Abuse: No Compulsions: No  Neurologic: Headache: No Seizure: No Paresthesias: No  Outpatient Encounter Medications as of 07/07/2018  Medication Sig  . ALPRAZolam (XANAX XR) 2 MG 24 hr tablet Take 1 tablet (2 mg total)  by mouth daily. For anxiety  . ALPRAZolam (XANAX) 0.5 MG tablet Take 1 tablet (0.5 mg total) by mouth 2 (two) times daily as needed for anxiety.  Marland Kitchen aspirin EC 81 MG tablet Take 81 mg by mouth daily.  . cholecalciferol (VITAMIN D3) 25 MCG (1000 UT) tablet Take 1 tablet (1,000 Units total) by mouth daily. For bone health  . colestipol (COLESTID) 1 g tablet Take 2 tablets (2 g total) by mouth 2 (two) times daily. For high cholesterol  . dicyclomine (BENTYL) 20 MG tablet Take 1 tablet (20 mg total) by mouth 4 (four) times daily -  before meals and at bedtime. For bowel spasms (Patient not taking: Reported on 07/06/2018)  . esomeprazole (NEXIUM) 40 MG capsule Take 1 capsule (40 mg total) by mouth daily. For acid reflux  . FLUoxetine (PROZAC) 40 MG capsule Take 2 capsules (80 mg total) by mouth daily. For depression  . metoprolol tartrate (LOPRESSOR) 25 MG tablet Take 1 tablet (25 mg total) by mouth 2 (two) times daily. For hypertension  . rosuvastatin (CRESTOR) 20 MG tablet Take one tablet by mouth once daily: For high cholesterol  . SYNTHROID 125 MCG tablet TAKE 1 TABLET(125 MCG) BY MOUTH DAILY BEFORE BREAKFAST: For thyroid hormone replacement  . traZODone (DESYREL) 100 MG tablet Take 2 tablets (200 mg total) by mouth at bedtime as needed for sleep.   No facility-administered encounter medications on file as of 07/07/2018.     Recent Results (from the past 2160 hour(s))  CBC     Status: Abnormal   Collection Time: 06/21/18  6:20 AM  Result Value Ref Range   WBC 8.9 4.0 - 10.5 K/uL   RBC 4.98 3.87 - 5.11 MIL/uL   Hemoglobin 14.6 12.0 - 15.0 g/dL   HCT 46.2 (H) 36.0 - 46.0 %   MCV 92.8 80.0 - 100.0 fL   MCH 29.3 26.0 - 34.0 pg   MCHC 31.6 30.0 - 36.0 g/dL   RDW 12.7 11.5 - 15.5 %   Platelets 300 150 - 400 K/uL   nRBC 0.0 0.0 - 0.2 %    Comment: Performed at [facility], [place] [address] [place], [place] [address]  Comprehensive metabolic panel     Status: Abnormal    Collection Time: 06/21/18  6:20 AM  Result Value Ref Range   Sodium 140 135 - 145 mmol/L   Potassium 4.0 3.5 - 5.1 mmol/L   Chloride 102 98 - 111 mmol/L   CO2 28 22 - 32 mmol/L   Glucose, Bld 106 (H) 70 - 99 mg/dL   BUN 15 6 - 20 mg/dL   Creatinine, Ser 1.08 (H) 0.44 - 1.00 mg/dL   Calcium 9.9 8.9 - 10.3 mg/dL   Total Protein 7.7 6.5 - 8.1 g/dL   Albumin 4.4 3.5 - 5.0 g/dL   AST 24 15 - 41 U/L   ALT 29 0 - 44 U/L   Alkaline Phosphatase 87 38 - 126 U/L   Total Bilirubin 1.1 0.3 - 1.2 mg/dL   GFR calc non Af  Amer >60 >60 mL/min   GFR calc Af Amer >60 >60 mL/min   Anion gap 10 5 - 15    Comment: Performed at [facility], [place] [address] [place], [place] [address]  Hemoglobin A1c     Status: None   Collection Time: 06/21/18  6:20 AM  Result Value Ref Range   Hgb A1c MFr Bld 5.6 4.8 - 5.6 %    Comment: (NOTE) Pre diabetes:          5.7%-6.4% Diabetes:              >6.4% Glycemic control for   <7.0% adults with diabetes    Mean Plasma Glucose 114.02 mg/dL    Comment: Performed at [facility] [address], [place], [place] [address]  Lipid panel     Status: Abnormal   Collection Time: 06/21/18  6:20 AM  Result Value Ref Range   Cholesterol 258 (H) 0 - 200 mg/dL   Triglycerides 185 (H) <150 mg/dL   HDL 50 >40 mg/dL   Total CHOL/HDL Ratio 5.2 RATIO   VLDL 37 0 - 40 mg/dL   LDL Cholesterol 171 (H) 0 - 99 mg/dL    Comment:        Total Cholesterol/HDL:CHD Risk Coronary Heart Disease Risk Table                     Men   Women  1/2 Average Risk   3.4   3.3  Average Risk       5.0   4.4  2 X Average Risk   9.6   7.1  3 X Average Risk  23.4   11.0        Use the calculated Patient Ratio above and the CHD Risk Table to determine the patient's CHD Risk.        ATP III CLASSIFICATION (LDL):  <100     mg/dL   Optimal  100-129  mg/dL   Near or Above                    Optimal  130-159  mg/dL   Borderline  160-189  mg/dL   High  >190     mg/dL    Very High Performed at [facility] [address], [place], [place] [address]   TSH     Status: None   Collection Time: 06/21/18  6:20 AM  Result Value Ref Range   TSH 3.264 0.350 - 4.500 uIU/mL    Comment: Performed by a 3rd Generation assay with a functional sensitivity of <=0.01 uIU/mL. Performed at [facility], [place] [address], [place], [place] [address]   Basic metabolic panel     Status: Abnormal   Collection Time: 06/24/18  6:47 AM  Result Value Ref Range   Sodium 138 135 - 145 mmol/L   Potassium 4.3 3.5 - 5.1 mmol/L   Chloride 102 98 - 111 mmol/L   CO2 28 22 - 32 mmol/L   Glucose, Bld 100 (H) 70 - 99 mg/dL   BUN 11 6 - 20 mg/dL   Creatinine, Ser 1.12 (H) 0.44 - 1.00 mg/dL   Calcium 9.5 8.9 - 10.3 mg/dL   GFR calc non Af Amer 60 (L) >60 mL/min   GFR calc Af Amer >60 >60 mL/min   Anion gap 8 5 - 15    Comment: Performed at [facility], [place] [address]  [place], [place] [address]  Urinalysis, Routine w reflex microscopic     Status: Abnormal   Collection Time: 06/27/18 11:48 AM  Result Value Ref Range   Color, Urine YELLOW YELLOW   APPearance CLEAR CLEAR   Specific Gravity, Urine 1.011 1.005 - 1.030   pH 5.0 5.0 - 8.0   Glucose, UA NEGATIVE NEGATIVE mg/dL   Hgb urine dipstick NEGATIVE NEGATIVE   Bilirubin Urine NEGATIVE NEGATIVE   Ketones, ur NEGATIVE NEGATIVE mg/dL   Protein, ur NEGATIVE NEGATIVE mg/dL   Nitrite NEGATIVE NEGATIVE   Leukocytes, UA TRACE (A) NEGATIVE   RBC / HPF 0-5 0 - 5 RBC/hpf   WBC, UA 0-5 0 - 5 WBC/hpf   Bacteria, UA NONE SEEN NONE SEEN   Squamous Epithelial / LPF 0-5 0 - 5   Mucus PRESENT     Comment: Performed at [facility], [place] [address], [place], [place] [address]    Physical Exam: Consitutional ;  LMP 03/19/2011   Musculoskeletal: Strength & Muscle Tone: within normal limits Gait & Station: normal Patient leans: N/A   Psychiatric Specialty  Exam: General Appearance: Casual  Eye Contact::  Good  Speech:  Clear and Coherent  Volume:  Normal  Mood:  Dysphoric  Affect:  Congruent  Thought Process:  Goal Directed  Orientation:  Full (Time, Place, and Person)  Thought Content:  Rumination and Thinking about her son's death but also positive about the future.  Suicidal Thoughts:  Passive suicidal thoughts but no plan or any intent.  Homicidal Thoughts:  No  Memory:  Immediate;   Fair Recent;   Good Remote;   Good  Judgement:  Good  Insight:  Good  Psychomotor Activity:  Normal  Concentration:  Fair  Recall:  [place] of Knowledge:  Good  Language:  Good  Akathisia:  No  Handed:  Right  AIMS (if indicated):     Assets:  Communication Skills Desire for Improvement Housing Resilience Social Support  ADL's:  Intact  Cognition:  WNL  Sleep:   fair    Assessment/Plan: Tanika is 43 year old Caucasian, divorced female who is recently discharged from [facility] to start PHP.  She was admitted due to having suicidal thoughts and plan to shoot herself.  She witnessed her 5 year old son's suicide while he was hanging and shot himself.  In the hospital medicines were adjusted.  She is positive about future and told that her son's suicide change about the perception of suicide.  She does not want her family member to go through this.  She lives with her 56 year old son and her parents.  We talked about considering Minipress for nightmares.  Patient is already taking 2 antihypertensive and she will discuss with her primary [name] physician if 1 of the antihypertensive can switch with Minipress.  We also talked about EMDR to help her PTSD symptoms.  She will require a referral to see a therapist for EMDR once she finished the PHP.  Patient agreed with the plan.  She will also resume her medication management [name] from Dr. Toy [name].  She had appointment on February 24.  We discussed in detail about safety concerns at any time  having active suicidal thoughts or homicidal thought then she need to call 911 or go to local emergency room.  Axis III:  Past Medical History:  Diagnosis Date  . Anxiety   . Arthritis    ruptured lumbar disc-careful with positioning  . Blood dyscrasia    protein s  deficiency-no treatment since 2006  . Depression   . GERD (gastroesophageal reflux disease)   . Headache(784.0)   . Hemorrhoids   . High cholesterol   . Hyperlipemia   . Hypertension   . Hypothyroidism   . IBS (irritable bowel syndrome)   . MVP (mitral valve prolapse)    echo per dr Dagmar Hait  . Protein S deficiency ([place]) 2003   dvt/pulmonary embolus-on coumadin for 6 months then off-Sees [facility]  . Pulmonary embolism ([place])   . PVC (premature ventricular contraction)     [facility] 07/07/2018

## 2018-07-07 NOTE — Therapy (Signed)
[place], [place], [address] Phone: [phone]   Fax:  [phone]  Occupational Therapy Treatment  Patient Details  Name: Andrea [name] MRN: [id number] Date of Birth: 21-Mar-1976 Referring Provider (OT): Ricky Ala, NP   Encounter Date: 07/07/2018  OT End of Session - 07/07/18 1537    Visit Number  2    Number of Visits  16    Date for OT Re-Evaluation  08/03/18    Authorization Type  [employer] medicare    OT Start Time  1100    OT Stop Time  1200    OT Time Calculation (min)  60 min    Activity Tolerance  Patient tolerated treatment well    Behavior During Therapy  [facility] for tasks assessed/performed       Past Medical History:  Diagnosis Date  . Anxiety   . Arthritis    ruptured lumbar disc-careful with positioning  . Blood dyscrasia    protein s deficiency-no treatment since 2006  . Depression   . GERD (gastroesophageal reflux disease)   . Headache(784.0)   . Hemorrhoids   . High cholesterol   . Hyperlipemia   . Hypertension   . Hypothyroidism   . IBS (irritable bowel syndrome)   . MVP (mitral valve prolapse)    echo per dr Dagmar Hait  . Protein S deficiency ([place]) 2003   dvt/pulmonary embolus-on coumadin for 6 months then off-Sees [facility]  . Pulmonary embolism ([place])   . PVC (premature ventricular contraction)     Past Surgical History:  Procedure Laterality Date  . CESAREAN SECTION  2006  . CHOLECYSTECTOMY N/A 11/03/2014   Procedure: LAPAROSCOPIC CHOLECYSTECTOMY WITH INTRAOPERATIVE CHOLANGIOGRAM;  Surgeon: Georganna Skeans, MD;  Location: [facility];  Service: General;  Laterality: N/A;  . LAPAROSCOPIC ASSISTED VAGINAL HYSTERECTOMY  03/27/2011   Procedure: LAPAROSCOPIC ASSISTED VAGINAL HYSTERECTOMY;  Surgeon: Cyril Mourning, MD;  Location: [facility];  Service: Gynecology;  Laterality: N/A;  . SALPINGOOPHORECTOMY  03/27/2011   Procedure: SALPINGO OOPHERECTOMY;  Surgeon: Cyril Mourning, MD;  Location: [facility];  Service: Gynecology;  Laterality: Bilateral;  . TONSILLECTOMY  89  . vaginal reconstructive surgery  2001    There were no vitals filed for this visit.  Subjective Assessment - 07/07/18 1537    Currently in Pain?  No/denies         S: "My ex husband took all of my self esteem"   O:Education given on self esteem and how it relates to daily experiences. Pt asked to give definition of self esteem, and current rating of self esteem. Positive and negative contributors of self esteem to be brainstormed within group in relation to personal experiences. Positive thinking activity then completed for pt to identify several positive traits about themselves. Pt asked to share at end of session.  ?  A: Pt presents to group with flat affect, engaged and participatory throughout entirety of session. Pt shares that her current self esteem is low, tangential about how ex husband has depleted her self esteem. She mentions that other people can influence her self esteem negatively. She shares that positive people and social support are an increase to her self esteem. Pt finishing remainder of worksheet on own, being pulled by RN at very end of session. ?  P: OT treatment will be 4x per week while pt in PHP.  OT Education - 07/07/18 1537    Education Details  education given on self esteem skills    Person(s) Educated  Patient    Methods  Explanation;Handout    Comprehension  Verbalized understanding       OT Short Term Goals - 07/07/18 1538      OT SHORT TERM GOAL #1   Title  Pt will be educated on strategies to improve psychosocial skills needed to participate fully in all daily, work, and leisure activities    Time  4    Period  Weeks    Status  On-going    Target Date  08/03/18      OT SHORT TERM GOAL #2   Title  Pt will apply psychosocial skills and coping mechanisms to daily activities in order to function independently and  reintegrate into community dwelling    Time  4    Period  Weeks    Status  On-going    Target Date  08/03/18      OT SHORT TERM GOAL #3   Title  Pt will engage in goal setting to improve functional BADL/IADL routine upon reintegrating into community    Time  4    Period  Weeks    Status  On-going    Target Date  08/03/18      OT SHORT TERM GOAL #4   Title  Pt will create and implement functional BADL/IADL routine upon reintegrating into community for increased engagement in all daily, work, and leisure activities    Time  4    Period  Weeks    Status  On-going    Target Date  08/03/18      OT SHORT TERM GOAL #5   Title  Pt will choose and/or engage in 1-3 socially engaging leisure activities to improve social participation upon reintegrating into community    Time  4    Period  Weeks    Status  On-going    Target Date  08/03/18               Plan - 07/07/18 1538    Occupational performance deficits (Please refer to evaluation for details):  ADL's;IADL's;Rest and Sleep;Work;Leisure;Social Participation       Patient will benefit from skilled therapeutic intervention in order to improve the following deficits and impairments:  Decreased coping skills, Decreased psychosocial skills, Other (comment)(decreased ability to engage in BADL and reintegrate into community)  Visit Diagnosis: Organic personality disorder  Difficulty coping    Problem List Patient Active Problem List   Diagnosis Date Noted  . Severe recurrent major depression without psychotic features ([place]) 06/20/2018  . Hyperlipidemia LDL goal <100 02/21/2017  . High risk medication use 02/21/2017  . Prediabetes 05/22/2016  . Allergic rhinitis due to pollen 07/19/2015  . Hypertension 02/01/2015  . Dyspnea 10/22/2012  . Diarrhea 09/16/2012  . Nausea alone 09/16/2012  . Abdominal pain, other specified site 09/16/2012  . ANAL FISSURE 04/11/2010  . CANDIDIASIS, VAGINAL 11/25/2008  .  HEMORRHOIDS-EXTERNAL 11/25/2008  . RECTAL BLEEDING 11/25/2008  . ABDOMINAL PAIN-LUQ 11/25/2008  . NONSPECIFIC ABN FINDING RAD & OTH EXAM GI TRACT 11/25/2008  . Hypothyroidism 09/14/2007  . GERD 09/14/2007  . IBS 09/14/2007  . TONSILLECTOMY, HX OF 09/14/2007   Zenovia Jarred, MSOT, OTR/L Behavioral Health OT/ Acute Relief OT PHP Office: [phone]  Zenovia Jarred 07/07/2018, 3:40 PM  [facility]ology PARTIAL HOSPITALIZATION PROGRAM [name], [place], [address] Phone: [phone]  Fax:  [phone]  Name: FENDI MEINHARDT MRN: [id number] Date of Birth: 10/16/1975

## 2018-07-08 ENCOUNTER — Other Ambulatory Visit (HOSPITAL_COMMUNITY): Payer: Medicare HMO | Admitting: Licensed Clinical Social Worker

## 2018-07-08 DIAGNOSIS — F332 Major depressive disorder, recurrent severe without psychotic features: Secondary | ICD-10-CM

## 2018-07-08 DIAGNOSIS — F4310 Post-traumatic stress disorder, unspecified: Secondary | ICD-10-CM

## 2018-07-08 DIAGNOSIS — F329 Major depressive disorder, single episode, unspecified: Secondary | ICD-10-CM | POA: Diagnosis not present

## 2018-07-09 ENCOUNTER — Other Ambulatory Visit (HOSPITAL_COMMUNITY): Payer: Medicare HMO | Admitting: Licensed Clinical Social Worker

## 2018-07-09 ENCOUNTER — Other Ambulatory Visit (HOSPITAL_COMMUNITY): Payer: Medicare HMO | Admitting: Occupational Therapy

## 2018-07-09 ENCOUNTER — Encounter (HOSPITAL_COMMUNITY): Payer: Self-pay

## 2018-07-09 DIAGNOSIS — R4589 Other symptoms and signs involving emotional state: Secondary | ICD-10-CM

## 2018-07-09 DIAGNOSIS — F070 Personality change due to known physiological condition: Secondary | ICD-10-CM

## 2018-07-09 DIAGNOSIS — F329 Major depressive disorder, single episode, unspecified: Secondary | ICD-10-CM | POA: Diagnosis not present

## 2018-07-09 DIAGNOSIS — F332 Major depressive disorder, recurrent severe without psychotic features: Secondary | ICD-10-CM

## 2018-07-09 DIAGNOSIS — F4310 Post-traumatic stress disorder, unspecified: Secondary | ICD-10-CM

## 2018-07-09 NOTE — Therapy (Signed)
[place], [place], [address] Phone: [phone]   Fax:  [phone]  Occupational Therapy Treatment  Patient Details  Name: Andrea [name] MRN: [id number] Date of Birth: 1975/06/19 Referring Provider (OT): Ricky Ala, NP   Encounter Date: 07/09/2018  OT End of Session - 07/09/18 1316    Visit Number  3    Number of Visits  16    Date for OT Re-Evaluation  08/03/18    Authorization Type  [employer] medicare    OT Start Time  1100    OT Stop Time  1200    OT Time Calculation (min)  60 min    Activity Tolerance  Patient tolerated treatment well    Behavior During Therapy  [facility] for tasks assessed/performed       Past Medical History:  Diagnosis Date  . Anxiety   . Arthritis    ruptured lumbar disc-careful with positioning  . Blood dyscrasia    protein s deficiency-no treatment since 2006  . Depression   . GERD (gastroesophageal reflux disease)   . Headache(784.0)   . Hemorrhoids   . High cholesterol   . Hyperlipemia   . Hypertension   . Hypothyroidism   . IBS (irritable bowel syndrome)   . MVP (mitral valve prolapse)    echo per dr Dagmar Hait  . Protein S deficiency ([place]) 2003   dvt/pulmonary embolus-on coumadin for 6 months then off-Sees [facility]  . Pulmonary embolism ([place])   . PVC (premature ventricular contraction)     Past Surgical History:  Procedure Laterality Date  . CESAREAN SECTION  2006  . CHOLECYSTECTOMY N/A 11/03/2014   Procedure: LAPAROSCOPIC CHOLECYSTECTOMY WITH INTRAOPERATIVE CHOLANGIOGRAM;  Surgeon: Georganna Skeans, MD;  Location: [facility];  Service: General;  Laterality: N/A;  . LAPAROSCOPIC ASSISTED VAGINAL HYSTERECTOMY  03/27/2011   Procedure: LAPAROSCOPIC ASSISTED VAGINAL HYSTERECTOMY;  Surgeon: Cyril Mourning, MD;  Location: [facility];  Service: Gynecology;  Laterality: N/A;  . SALPINGOOPHORECTOMY  03/27/2011   Procedure: SALPINGO OOPHERECTOMY;  Surgeon: Cyril Mourning, MD;  Location: [facility];  Service: Gynecology;  Laterality: Bilateral;  . TONSILLECTOMY  92  . vaginal reconstructive surgery  2001    There were no vitals filed for this visit.  Subjective Assessment - 07/09/18 1314    Currently in Pain?  Other (Comment)   chronic back pain       S: "My dad is my go to person for a lot of things"  O:Eduation given on importance of social participation when reintegrating into community. Further education given on varying types of social participation (emotional, tangible, informational, and social needs), how they can be of use, the barriers, and how to apply them to current problems. Additional information given on community options for socially engaging leisure activities. Pt to choose one area this date that will help improve social participation.   A: Pt presents to group with flat affect, engaged and participatory throughout entirety of session. Pt shares that her social support is her dad, her dog, and her church family. She shares that she has difficulty accepting help when others are offering and wishes to work on this. Her goal is to engage in a social/leisure activity in the community to help increase social participation in her daily routine.   P: OT will continue to follow up on social participation information for increased implementation into daily routine.  OT Education - 07/09/18 1315    Education Details  education given on social participation    Person(s) Educated  Patient    Methods  Explanation;Handout    Comprehension  Verbalized understanding       OT Short Term Goals - 07/07/18 1538      OT SHORT TERM GOAL #1   Title  Pt will be educated on strategies to improve psychosocial skills needed to participate fully in all daily, work, and leisure activities    Time  4    Period  Weeks    Status  On-going    Target Date  08/03/18      OT SHORT TERM GOAL #2   Title  Pt will apply  psychosocial skills and coping mechanisms to daily activities in order to function independently and reintegrate into community dwelling    Time  4    Period  Weeks    Status  On-going    Target Date  08/03/18      OT SHORT TERM GOAL #3   Title  Pt will engage in goal setting to improve functional BADL/IADL routine upon reintegrating into community    Time  4    Period  Weeks    Status  On-going    Target Date  08/03/18      OT SHORT TERM GOAL #4   Title  Pt will create and implement functional BADL/IADL routine upon reintegrating into community for increased engagement in all daily, work, and leisure activities    Time  4    Period  Weeks    Status  On-going    Target Date  08/03/18      OT SHORT TERM GOAL #5   Title  Pt will choose and/or engage in 1-3 socially engaging leisure activities to improve social participation upon reintegrating into community    Time  4    Period  Weeks    Status  On-going    Target Date  08/03/18               Plan - 07/09/18 1316    Occupational performance deficits (Please refer to evaluation for details):  ADL's;IADL's;Rest and Sleep;Work;Leisure;Social Participation       Patient will benefit from skilled therapeutic intervention in order to improve the following deficits and impairments:  Decreased coping skills, Decreased psychosocial skills, Other (comment)(decreased ability to engage in BADL and reintegrate into community)  Visit Diagnosis: Organic personality disorder  Difficulty coping    Problem List Patient Active Problem List   Diagnosis Date Noted  . Severe recurrent major depression without psychotic features ([place]) 06/20/2018  . Hyperlipidemia LDL goal <100 02/21/2017  . High risk medication use 02/21/2017  . Prediabetes 05/22/2016  . Allergic rhinitis due to pollen 07/19/2015  . Hypertension 02/01/2015  . Dyspnea 10/22/2012  . Diarrhea 09/16/2012  . Nausea alone 09/16/2012  . Abdominal pain, other specified  site 09/16/2012  . ANAL FISSURE 04/11/2010  . CANDIDIASIS, VAGINAL 11/25/2008  . HEMORRHOIDS-EXTERNAL 11/25/2008  . RECTAL BLEEDING 11/25/2008  . ABDOMINAL PAIN-LUQ 11/25/2008  . NONSPECIFIC ABN FINDING RAD & OTH EXAM GI TRACT 11/25/2008  . Hypothyroidism 09/14/2007  . GERD 09/14/2007  . IBS 09/14/2007  . TONSILLECTOMY, HX OF 09/14/2007   Zenovia Jarred, MSOT, OTR/L Behavioral Health OT/ Acute Relief OT PHP Office: [phone]  Zenovia Jarred 07/09/2018, 1:22 PM  [facility] HOSPITALIZATION PROGRAM [place], [place], [address] Phone: [phone]  Fax:  [phone]  Name: Andrea [name] MRN: [id number] Date of Birth: 1975/12/30

## 2018-07-10 ENCOUNTER — Encounter (HOSPITAL_COMMUNITY): Payer: Self-pay | Admitting: Family

## 2018-07-10 ENCOUNTER — Encounter (HOSPITAL_COMMUNITY): Payer: Self-pay | Admitting: Occupational Therapy

## 2018-07-10 ENCOUNTER — Other Ambulatory Visit (HOSPITAL_COMMUNITY): Payer: Medicare HMO | Admitting: Licensed Clinical Social Worker

## 2018-07-10 ENCOUNTER — Other Ambulatory Visit (HOSPITAL_COMMUNITY): Payer: Medicare HMO | Admitting: Occupational Therapy

## 2018-07-10 DIAGNOSIS — F332 Major depressive disorder, recurrent severe without psychotic features: Secondary | ICD-10-CM

## 2018-07-10 DIAGNOSIS — R4589 Other symptoms and signs involving emotional state: Secondary | ICD-10-CM

## 2018-07-10 DIAGNOSIS — F329 Major depressive disorder, single episode, unspecified: Secondary | ICD-10-CM | POA: Diagnosis not present

## 2018-07-10 DIAGNOSIS — F070 Personality change due to known physiological condition: Secondary | ICD-10-CM

## 2018-07-10 DIAGNOSIS — F4310 Post-traumatic stress disorder, unspecified: Secondary | ICD-10-CM

## 2018-07-10 NOTE — Therapy (Signed)
[place], [place], [address] Phone: [phone]   Fax:  [phone]  Occupational Therapy Treatment  Patient Details  Name: Andrea [name] MRN: [id number] Date of Birth: 07/01/1975 Referring Provider (OT): Ricky Ala, NP   Encounter Date: 07/10/2018  OT End of Session - 07/10/18 1306    Visit Number  4    Number of Visits  16    Date for OT Re-Evaluation  08/03/18    Authorization Type  [employer]na medicare    OT Start Time  1100    OT Stop Time  1200    OT Time Calculation (min)  60 min    Activity Tolerance  Patient tolerated treatment well    Behavior During Therapy  [facility] for tasks assessed/performed       Past Medical History:  Diagnosis Date  . Anxiety   . Arthritis    ruptured lumbar disc-careful with positioning  . Blood dyscrasia    protein s deficiency-no treatment since 2006  . Depression   . GERD (gastroesophageal reflux disease)   . Headache(784.0)   . Hemorrhoids   . High cholesterol   . Hyperlipemia   . Hypertension   . Hypothyroidism   . IBS (irritable bowel syndrome)   . MVP (mitral valve prolapse)    echo per dr Dagmar Hait  . Protein S deficiency ([place]) 2003   dvt/pulmonary embolus-on coumadin for 6 months then off-Sees [facility]  . Pulmonary embolism ([place])   . PVC (premature ventricular contraction)     Past Surgical History:  Procedure Laterality Date  . CESAREAN SECTION  2006  . CHOLECYSTECTOMY N/A 11/03/2014   Procedure: LAPAROSCOPIC CHOLECYSTECTOMY WITH INTRAOPERATIVE CHOLANGIOGRAM;  Surgeon: Georganna Skeans, MD;  Location: [facility];  Service: General;  Laterality: N/A;  . LAPAROSCOPIC ASSISTED VAGINAL HYSTERECTOMY  03/27/2011   Procedure: LAPAROSCOPIC ASSISTED VAGINAL HYSTERECTOMY;  Surgeon: Cyril Mourning, MD;  Location: [facility];  Service: Gynecology;  Laterality: N/A;  . SALPINGOOPHORECTOMY  03/27/2011   Procedure: SALPINGO OOPHERECTOMY;  Surgeon: Cyril Mourning, MD;  Location: [facility];  Service: Gynecology;  Laterality: Bilateral;  . TONSILLECTOMY  92  . vaginal reconstructive surgery  2001    There were no vitals filed for this visit.  Subjective Assessment - 07/10/18 1306    Currently in Pain?  Other (Comment)   chronic pain        S: "Doing art like this is very therapeutic"   O: Education given on goal setting in the form of artistic expression. Pt asked to brainstorm goals for BADL/IADL routine prior to reintegrating into community. Pt then to display goals through artistic media in the form of a vision board. Music played to facilitate additional relaxation response. Pt asked to share finished work at end of session, and to display in well viewed area in home for additional reminder.  A: Pt presents to group with blunted affect, engaged and participatory throughout entirety of session. Pt shares that her vision board is surrounded by increasing happy emotions and genuinely smiling and laughing again. She also states that she is in the process of developing her "new normal" after her son's unexpected death. Noted improved affect at end of activity.  P: OT group will be 4x per week while pt in PHP                 OT Education - 07/10/18 1306    Education Details  education given on  goal setting through use of art therapy    Person(s) Educated  Patient    Methods  Explanation;Handout    Comprehension  Verbalized understanding       OT Short Term Goals - 07/07/18 1538      OT SHORT TERM GOAL #1   Title  Pt will be educated on strategies to improve psychosocial skills needed to participate fully in all daily, work, and leisure activities    Time  4    Period  Weeks    Status  On-going    Target Date  08/03/18      OT SHORT TERM GOAL #2   Title  Pt will apply psychosocial skills and coping mechanisms to daily activities in order to function independently and reintegrate into community dwelling    Time  4     Period  Weeks    Status  On-going    Target Date  08/03/18      OT SHORT TERM GOAL #3   Title  Pt will engage in goal setting to improve functional BADL/IADL routine upon reintegrating into community    Time  4    Period  Weeks    Status  On-going    Target Date  08/03/18      OT SHORT TERM GOAL #4   Title  Pt will create and implement functional BADL/IADL routine upon reintegrating into community for increased engagement in all daily, work, and leisure activities    Time  4    Period  Weeks    Status  On-going    Target Date  08/03/18      OT SHORT TERM GOAL #5   Title  Pt will choose and/or engage in 1-3 socially engaging leisure activities to improve social participation upon reintegrating into community    Time  4    Period  Weeks    Status  On-going    Target Date  08/03/18               Plan - 07/10/18 1307    Occupational performance deficits (Please refer to evaluation for details):  ADL's;IADL's;Rest and Sleep;Work;Leisure;Social Participation       Patient will benefit from skilled therapeutic intervention in order to improve the following deficits and impairments:  Decreased coping skills, Decreased psychosocial skills, Other (comment)(decreased ability to engage in BADL and reintegrate into community)  Visit Diagnosis: Organic personality disorder  Difficulty coping    Problem List Patient Active Problem List   Diagnosis Date Noted  . Severe recurrent major depression without psychotic features ([place]) 06/20/2018  . Hyperlipidemia LDL goal <100 02/21/2017  . High risk medication use 02/21/2017  . Prediabetes 05/22/2016  . Allergic rhinitis due to pollen 07/19/2015  . Hypertension 02/01/2015  . Dyspnea 10/22/2012  . Diarrhea 09/16/2012  . Nausea alone 09/16/2012  . Abdominal pain, other specified site 09/16/2012  . ANAL FISSURE 04/11/2010  . CANDIDIASIS, VAGINAL 11/25/2008  . HEMORRHOIDS-EXTERNAL 11/25/2008  . RECTAL BLEEDING 11/25/2008  .  ABDOMINAL PAIN-LUQ 11/25/2008  . NONSPECIFIC ABN FINDING RAD & OTH EXAM GI TRACT 11/25/2008  . Hypothyroidism 09/14/2007  . GERD 09/14/2007  . IBS 09/14/2007  . TONSILLECTOMY, HX OF 09/14/2007   Zenovia Jarred, MSOT, OTR/L Behavioral Health OT/ Acute Relief OT PHP Office: [phone]  Zenovia Jarred 07/10/2018, 1:07 PM  [facility] PARTIAL HOSPITALIZATION PROGRAM [place], [place], [address] Phone: [phone]   Fax:  [phone]  Name: Andrea [name] MRN:  [id number] Date of Birth: October 28, 1975

## 2018-07-13 ENCOUNTER — Encounter (HOSPITAL_COMMUNITY): Payer: Self-pay | Admitting: Occupational Therapy

## 2018-07-13 ENCOUNTER — Other Ambulatory Visit (HOSPITAL_COMMUNITY): Payer: Medicare HMO | Admitting: Licensed Clinical Social Worker

## 2018-07-13 ENCOUNTER — Other Ambulatory Visit (HOSPITAL_COMMUNITY): Payer: Medicare HMO | Admitting: Occupational Therapy

## 2018-07-13 VITALS — BP 122/86 | HR 55 | Ht 68.5 in | Wt 227.0 lb

## 2018-07-13 DIAGNOSIS — F329 Major depressive disorder, single episode, unspecified: Secondary | ICD-10-CM | POA: Diagnosis not present

## 2018-07-13 DIAGNOSIS — F332 Major depressive disorder, recurrent severe without psychotic features: Secondary | ICD-10-CM

## 2018-07-13 DIAGNOSIS — R4589 Other symptoms and signs involving emotional state: Secondary | ICD-10-CM

## 2018-07-13 DIAGNOSIS — F070 Personality change due to known physiological condition: Secondary | ICD-10-CM

## 2018-07-13 DIAGNOSIS — F4310 Post-traumatic stress disorder, unspecified: Secondary | ICD-10-CM

## 2018-07-13 NOTE — Progress Notes (Signed)
Patient presented with sad affect, depressed mood and acknowledged she had a tough few days following moving into an apartment with her Father from her home that she shared with her son prior to his suicide.  Patient reported she was at the residence for a period this past Saturday moving things and that she experienced a "wave of depression".  Stated she was outside and became extremely anxious when her son did not come out to meet her as reports in the past he would always come outside when she was there.  Patient denied any suicidal or homicidal ideations, no auditory or visual hallucinations and no plan or intent to want to harm self or others at this time.  States she just has not had an appetite to eat and has lost 8 pounds over the past week.  Patient reported her Father "forces me to eat" but admits this is a limited amount along with drinking water and Gatorade daily.  Discussed food choices and nutrition with patient as she agreed to try protein shakes and other items that may give her more substance and energy.  Discussed patient's use of coping and distraction skills to help manage the "wave" of emotions she is currently experiencing as she is happy to now be moved and reports her Father is her "rock" that she lives with now.  Patient reported she only slept about 3 hours the previous night but was better prior to visit to her old home on Saturday.  Reports she went to church on yesterday and is glad to be back in group today.  Patients pulse was low today staying between 50-60 and BP was 122/86.  Patient to follow up with PCP and cardiologist per discussion with Ricky Ala, NP who was notified but also again reiterated nutritional intake concerns.  Patient reported she still does not want to harm herself and has "a different view of suicide".  That she "wants to live now" but does acknowledge difficulty managing missing son and thoughts of wondering if he is alright now.  Patient reported no problems  with medications and agreed if any worsening of symptoms or starts thinking about wanting to harm self or others she will inform this nurse, PHP team or her family supports.  Patient agreed to keep a daily log of intake and of sleep and instructed to follow up with cardiologist on low pulse.

## 2018-07-13 NOTE — Therapy (Signed)
[place], [place], [address] Phone: [phone]   Fax:  [phone]  Occupational Therapy Treatment  Patient Details  Name: Andrea [name] MRN: [id number] Date of Birth: 1976-01-25 Referring Provider (OT): Ricky Ala, NP   Encounter Date: 07/13/2018  OT End of Session - 07/13/18 1326    Visit Number  5    Number of Visits  16    Date for OT Re-Evaluation  08/03/18    Authorization Type  [employer]na medicare    OT Start Time  1100    OT Stop Time  1200    OT Time Calculation (min)  60 min    Activity Tolerance  Patient tolerated treatment well    Behavior During Therapy  [facility] for tasks assessed/performed       Past Medical History:  Diagnosis Date  . Anxiety   . Arthritis    ruptured lumbar disc-careful with positioning  . Blood dyscrasia    protein s deficiency-no treatment since 2006  . Depression   . GERD (gastroesophageal reflux disease)   . Headache(784.0)   . Hemorrhoids   . High cholesterol   . Hyperlipemia   . Hypertension   . Hypothyroidism   . IBS (irritable bowel syndrome)   . MVP (mitral valve prolapse)    echo per dr Dagmar Hait  . Protein S deficiency ([place]) 2003   dvt/pulmonary embolus-on coumadin for 6 months then off-Sees [facility]  . Pulmonary embolism ([place])   . PVC (premature ventricular contraction)     Past Surgical History:  Procedure Laterality Date  . CESAREAN SECTION  2006  . CHOLECYSTECTOMY N/A 11/03/2014   Procedure: LAPAROSCOPIC CHOLECYSTECTOMY WITH INTRAOPERATIVE CHOLANGIOGRAM;  Surgeon: Georganna Skeans, MD;  Location: [facility];  Service: General;  Laterality: N/A;  . LAPAROSCOPIC ASSISTED VAGINAL HYSTERECTOMY  03/27/2011   Procedure: LAPAROSCOPIC ASSISTED VAGINAL HYSTERECTOMY;  Surgeon: Cyril Mourning, MD;  Location: [facility];  Service: Gynecology;  Laterality: N/A;  . SALPINGOOPHORECTOMY  03/27/2011   Procedure: SALPINGO OOPHERECTOMY;  Surgeon: Cyril Mourning, MD;  Location: [facility];  Service: Gynecology;  Laterality: Bilateral;  . TONSILLECTOMY  92  . vaginal reconstructive surgery  2001    There were no vitals filed for this visit.  Subjective Assessment - 07/13/18 1326    Currently in Pain?  Other (Comment)   chronic pain        S: "I would like to find more productive things to do"  O: Activities wheel activity administered with pt to identify hours devoted to: work/obligations, leisure/relaxation, self-care/caregiving, and sleep/rest. Further education given to allow pt to better balance activity wheel for increased occupational balance. Weekly planner handouts given at end of session with education on different modalities of organization (planner, apps, computer, to do lists, etc.) so pt could choose preferred avenue. Further education given on the importance of time management in community reintegration from [facility] and how to continue using skills. Additional information given on medication management, from the aspect of lifestyle/organizational skills. Pt given medication chart and various apps to use to help increase compliance of medication while implementing it into BADL routine.   A: Pt presents to group with blunted affect, engaged and participatory throughout entirety of session. Pt completing activity wheel, stating she would like to find more productive ways to fill her time. She shares that she has considered volunteer work or the possibility of getting a part time job to help fulfill this. Encouraged pt  to begin searching for meaningful volunteer work. She shares she used to engage in volunteering with her son who has passed, but needs a new place to volunteer because she feels this would be too emotionally traumatic for her to return to.  P: OT tx will continue 4x per week while pt in PHP.                   OT Education - 07/13/18 1326    Education Details  education given on time management skills     Person(s) Educated  Patient    Methods  Explanation;Handout    Comprehension  Verbalized understanding       OT Short Term Goals - 07/07/18 1538      OT SHORT TERM GOAL #1   Title  Pt will be educated on strategies to improve psychosocial skills needed to participate fully in all daily, work, and leisure activities    Time  4    Period  Weeks    Status  On-going    Target Date  08/03/18      OT SHORT TERM GOAL #2   Title  Pt will apply psychosocial skills and coping mechanisms to daily activities in order to function independently and reintegrate into community dwelling    Time  4    Period  Weeks    Status  On-going    Target Date  08/03/18      OT SHORT TERM GOAL #3   Title  Pt will engage in goal setting to improve functional BADL/IADL routine upon reintegrating into community    Time  4    Period  Weeks    Status  On-going    Target Date  08/03/18      OT SHORT TERM GOAL #4   Title  Pt will create and implement functional BADL/IADL routine upon reintegrating into community for increased engagement in all daily, work, and leisure activities    Time  4    Period  Weeks    Status  On-going    Target Date  08/03/18      OT SHORT TERM GOAL #5   Title  Pt will choose and/or engage in 1-3 socially engaging leisure activities to improve social participation upon reintegrating into community    Time  4    Period  Weeks    Status  On-going    Target Date  08/03/18               Plan - 07/13/18 1327    Occupational performance deficits (Please refer to evaluation for details):  ADL's;IADL's;Rest and Sleep;Work;Leisure;Social Participation       Patient will benefit from skilled therapeutic intervention in order to improve the following deficits and impairments:  Decreased coping skills, Decreased psychosocial skills, Other (comment)(decreased ability to engage in BADL and reintegrate into community)  Visit Diagnosis: Organic personality disorder  Difficulty  coping    Problem List Patient Active Problem List   Diagnosis Date Noted  . Severe recurrent major depression without psychotic features ([place]) 06/20/2018  . Hyperlipidemia LDL goal <100 02/21/2017  . High risk medication use 02/21/2017  . Prediabetes 05/22/2016  . Allergic rhinitis due to pollen 07/19/2015  . Hypertension 02/01/2015  . Dyspnea 10/22/2012  . Diarrhea 09/16/2012  . Nausea alone 09/16/2012  . Abdominal pain, other specified site 09/16/2012  . ANAL FISSURE 04/11/2010  . CANDIDIASIS, VAGINAL 11/25/2008  . HEMORRHOIDS-EXTERNAL 11/25/2008  . RECTAL BLEEDING 11/25/2008  . ABDOMINAL  PAIN-LUQ 11/25/2008  . NONSPECIFIC ABN FINDING RAD & OTH EXAM GI TRACT 11/25/2008  . Hypothyroidism 09/14/2007  . GERD 09/14/2007  . IBS 09/14/2007  . TONSILLECTOMY, HX OF 09/14/2007   Zenovia Jarred, MSOT, OTR/L Behavioral Health OT/ Acute Relief OT PHP Office: [phone]  Zenovia Jarred 07/13/2018, 1:31 PM  [facility] PARTIAL HOSPITALIZATION PROGRAM [place], [place], [address] Phone: [phone]   Fax:  [phone]  Name: RETAJ HILBUN MRN: [id number] Date of Birth: 1975-11-16
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
## 2018-07-14 ENCOUNTER — Encounter (HOSPITAL_COMMUNITY): Payer: Self-pay | Admitting: Occupational Therapy

## 2018-07-14 ENCOUNTER — Other Ambulatory Visit (HOSPITAL_COMMUNITY): Payer: Medicare HMO | Admitting: Occupational Therapy

## 2018-07-14 ENCOUNTER — Other Ambulatory Visit (HOSPITAL_COMMUNITY): Payer: Medicare HMO | Admitting: Licensed Clinical Social Worker

## 2018-07-14 DIAGNOSIS — F4310 Post-traumatic stress disorder, unspecified: Secondary | ICD-10-CM

## 2018-07-14 DIAGNOSIS — F332 Major depressive disorder, recurrent severe without psychotic features: Secondary | ICD-10-CM

## 2018-07-14 DIAGNOSIS — F070 Personality change due to known physiological condition: Secondary | ICD-10-CM

## 2018-07-14 DIAGNOSIS — R4589 Other symptoms and signs involving emotional state: Secondary | ICD-10-CM

## 2018-07-14 DIAGNOSIS — F329 Major depressive disorder, single episode, unspecified: Secondary | ICD-10-CM | POA: Diagnosis not present

## 2018-07-14 NOTE — Therapy (Signed)
[place], [place], [address] Phone: [phone]   Fax:  [phone]  Occupational Therapy Treatment  Patient Details  Name: Andrea [name] MRN: [id number] Date of Birth: Nov 03, 1975 Referring Provider (OT): Ricky Ala, NP   Encounter Date: 07/14/2018  OT End of Session - 07/14/18 1353    Visit Number  6    Number of Visits  16    Date for OT Re-Evaluation  08/03/18    Authorization Type  [employer] medicare    OT Start Time  1100    OT Stop Time  1200    OT Time Calculation (min)  60 min    Activity Tolerance  Patient tolerated treatment well    Behavior During Therapy  [facility] for tasks assessed/performed       Past Medical History:  Diagnosis Date  . Anxiety   . Arthritis    ruptured lumbar disc-careful with positioning  . Blood dyscrasia    protein s deficiency-no treatment since 2006  . Depression   . GERD (gastroesophageal reflux disease)   . Headache(784.0)   . Hemorrhoids   . High cholesterol   . Hyperlipemia   . Hypertension   . Hypothyroidism   . IBS (irritable bowel syndrome)   . MVP (mitral valve prolapse)    echo per dr Dagmar Hait  . Protein S deficiency ([place]) 2003   dvt/pulmonary embolus-on coumadin for 6 months then off-Sees [facility]  . Pulmonary embolism ([place])   . PVC (premature ventricular contraction)     Past Surgical History:  Procedure Laterality Date  . CESAREAN SECTION  2006  . CHOLECYSTECTOMY N/A 11/03/2014   Procedure: LAPAROSCOPIC CHOLECYSTECTOMY WITH INTRAOPERATIVE CHOLANGIOGRAM;  Surgeon: Georganna Skeans, MD;  Location: [facility];  Service: General;  Laterality: N/A;  . LAPAROSCOPIC ASSISTED VAGINAL HYSTERECTOMY  03/27/2011   Procedure: LAPAROSCOPIC ASSISTED VAGINAL HYSTERECTOMY;  Surgeon: Cyril Mourning, MD;  Location: [facility];  Service: Gynecology;  Laterality: N/A;  . SALPINGOOPHORECTOMY  03/27/2011   Procedure: SALPINGO OOPHERECTOMY;  Surgeon: Cyril Mourning, MD;  Location: [facility];  Service: Gynecology;  Laterality: Bilateral;  . TONSILLECTOMY  92  . vaginal reconstructive surgery  2001    There were no vitals filed for this visit.  Subjective Assessment - 07/14/18 1351    Currently in Pain?  Other (Comment)   chronic pain        S: "I can be either passive or passive aggressive, I can be assertive- aggressive when it comes to my children"   O: Education and activities given in reference to increase assertiveness skills within daily life and relationships. Assertiveness quiz given to increase insight on pt current skills and how to improve based on given score. Further education given on assertive conversation, situations, body language, and appropriate context for skill. Worksheet given to identify three definitions (assertive, passive, and aggressive) with opportunity for role play between 2 participants in to promote assertiveness training in a variety of social settings (individuals in community and health care providers). Pt asked to identify one area to increase assertiveness this date. Further education given on the importance of assertiveness mentors and online research to continue skill building in this area increase quality of life.   A: Pt presents with blunted affect, engaged throughout entirety of group this date. Pt completed assertiveness quiz showing she is naturally passive/passive aggressive and needs to actively work on assertiveness skills- pt in agreeance with this score. Pt shares that  she is passive in her daily live and passive aggressive to the point of it "scaring people". She then states she can be assertive- aggressive when it comes to her children. Pt making many contradicting statements this date. She then goes on to say that she "does not have the ability" to be assertive. Continued education and follow up on assertiveness training for pt is warranted.   P: Pt provided with assertiveness skills to implement  into a variety of daily social situations. OT will continue to follow up with communication skills for successful implementation into daily life.                   OT Education - 07/14/18 1353    Education Details  education given on assertiveness communication skills    Person(s) Educated  Patient    Methods  Explanation;Handout    Comprehension  Verbalized understanding       OT Short Term Goals - 07/07/18 1538      OT SHORT TERM GOAL #1   Title  Pt will be educated on strategies to improve psychosocial skills needed to participate fully in all daily, work, and leisure activities    Time  4    Period  Weeks    Status  On-going    Target Date  08/03/18      OT SHORT TERM GOAL #2   Title  Pt will apply psychosocial skills and coping mechanisms to daily activities in order to function independently and reintegrate into community dwelling    Time  4    Period  Weeks    Status  On-going    Target Date  08/03/18      OT SHORT TERM GOAL #3   Title  Pt will engage in goal setting to improve functional BADL/IADL routine upon reintegrating into community    Time  4    Period  Weeks    Status  On-going    Target Date  08/03/18      OT SHORT TERM GOAL #4   Title  Pt will create and implement functional BADL/IADL routine upon reintegrating into community for increased engagement in all daily, work, and leisure activities    Time  4    Period  Weeks    Status  On-going    Target Date  08/03/18      OT SHORT TERM GOAL #5   Title  Pt will choose and/or engage in 1-3 socially engaging leisure activities to improve social participation upon reintegrating into community    Time  4    Period  Weeks    Status  On-going    Target Date  08/03/18               Plan - 07/14/18 1353    Occupational performance deficits (Please refer to evaluation for details):  ADL's;IADL's;Rest and Sleep;Work;Leisure;Social Participation       Patient will benefit from skilled  therapeutic intervention in order to improve the following deficits and impairments:  Decreased coping skills, Decreased psychosocial skills, Other (comment)(decreased ability to engage in BADL and reintegrate into community)  Visit Diagnosis: Organic personality disorder  Difficulty coping    Problem List Patient Active Problem List   Diagnosis Date Noted  . Severe recurrent major depression without psychotic features ([place]) 06/20/2018  . Hyperlipidemia LDL goal <100 02/21/2017  . High risk medication use 02/21/2017  . Prediabetes 05/22/2016  . Allergic rhinitis due to pollen 07/19/2015  . Hypertension 02/01/2015  .  Dyspnea 10/22/2012  . Diarrhea 09/16/2012  . Nausea alone 09/16/2012  . Abdominal pain, other specified site 09/16/2012  . ANAL FISSURE 04/11/2010  . CANDIDIASIS, VAGINAL 11/25/2008  . HEMORRHOIDS-EXTERNAL 11/25/2008  . RECTAL BLEEDING 11/25/2008  . ABDOMINAL PAIN-LUQ 11/25/2008  . NONSPECIFIC ABN FINDING RAD & OTH EXAM GI TRACT 11/25/2008  . Hypothyroidism 09/14/2007  . GERD 09/14/2007  . IBS 09/14/2007  . TONSILLECTOMY, HX OF 09/14/2007   Zenovia Jarred, MSOT, OTR/L Behavioral Health OT/ Acute Relief OT PHP Office: [phone]  Zenovia Jarred 07/14/2018, 1:54 PM  [facility] PARTIAL HOSPITALIZATION PROGRAM [place], [place], [address] Phone: [phone]   Fax:  [phone]  Name: LUV MISH MRN: [id number] Date of Birth: 08-24-1975

## 2018-07-15 ENCOUNTER — Other Ambulatory Visit (HOSPITAL_COMMUNITY): Payer: Medicare HMO | Admitting: Licensed Clinical Social Worker

## 2018-07-15 DIAGNOSIS — F4310 Post-traumatic stress disorder, unspecified: Secondary | ICD-10-CM

## 2018-07-15 DIAGNOSIS — F332 Major depressive disorder, recurrent severe without psychotic features: Secondary | ICD-10-CM

## 2018-07-15 DIAGNOSIS — F329 Major depressive disorder, single episode, unspecified: Secondary | ICD-10-CM | POA: Diagnosis not present

## 2018-07-15 NOTE — Progress Notes (Addendum)
GROUP NOTE - spiritual care group 07/15/2018 11:00 - 12:00 ?Facilitated by Counseling intern Doris Cheadle and [place], MDiv, [facility].  ? ? Group focused on topic of strength. ?Group members reflected on what thoughts and feelings emerge when they hear this topic. ?They then engaged in facilitated dialog around how strength is present in their lives. This dialog focused on representing what strength had been to them in their lives (images and patterns given) and what they saw as helpful in their life now (what they needed / wanted). ? ? Activity drew on narrative framework   Was alert and present throughout group. Voluntarily engaged with group members and facilitators throughout. She expressed that she has been fearful and uncertain of how to cope with the loss of her son, especially after leaving PHP. She shared a desire for genuine connection with others, and she identified trusting others and reaching out as barriers. However, she expressed that she has experienced this connection at [facility]. After she engaged in the art activity, she noted the importance of being aware of her needs in the moment, and recognized that this may change over time

## 2018-07-16 ENCOUNTER — Other Ambulatory Visit (HOSPITAL_COMMUNITY): Payer: Medicare HMO | Admitting: Occupational Therapy

## 2018-07-16 ENCOUNTER — Other Ambulatory Visit (HOSPITAL_COMMUNITY): Payer: Medicare HMO | Admitting: Licensed Clinical Social Worker

## 2018-07-16 ENCOUNTER — Encounter (HOSPITAL_COMMUNITY): Payer: Self-pay | Admitting: Occupational Therapy

## 2018-07-16 DIAGNOSIS — F332 Major depressive disorder, recurrent severe without psychotic features: Secondary | ICD-10-CM

## 2018-07-16 DIAGNOSIS — F4310 Post-traumatic stress disorder, unspecified: Secondary | ICD-10-CM

## 2018-07-16 DIAGNOSIS — F329 Major depressive disorder, single episode, unspecified: Secondary | ICD-10-CM | POA: Diagnosis not present

## 2018-07-16 DIAGNOSIS — F070 Personality change due to known physiological condition: Secondary | ICD-10-CM

## 2018-07-16 DIAGNOSIS — R4589 Other symptoms and signs involving emotional state: Secondary | ICD-10-CM

## 2018-07-16 NOTE — Therapy (Signed)
[place], [place], [address] Phone: [phone]   Fax:  [phone]  Occupational Therapy Treatment  Patient Details  Name: Andrea [name] MRN: [id number] Date of Birth: 03-28-1976 Referring Provider (OT): Ricky Ala, NP   Encounter Date: 07/16/2018  OT End of Session - 07/16/18 1435    Visit Number  7    Number of Visits  16    Date for OT Re-Evaluation  08/03/18    Authorization Type  [employer] medicare    OT Start Time  1100    OT Stop Time  1200    OT Time Calculation (min)  60 min    Activity Tolerance  Patient tolerated treatment well    Behavior During Therapy  [facility] for tasks assessed/performed       Past Medical History:  Diagnosis Date  . Anxiety   . Arthritis    ruptured lumbar disc-careful with positioning  . Blood dyscrasia    protein s deficiency-no treatment since 2006  . Depression   . GERD (gastroesophageal reflux disease)   . Headache(784.0)   . Hemorrhoids   . High cholesterol   . Hyperlipemia   . Hypertension   . Hypothyroidism   . IBS (irritable bowel syndrome)   . MVP (mitral valve prolapse)    echo per dr Dagmar Hait  . Protein S deficiency ([place]) 2003   dvt/pulmonary embolus-on coumadin for 6 months then off-Sees [facility]  . Pulmonary embolism ([place])   . PVC (premature ventricular contraction)     Past Surgical History:  Procedure Laterality Date  . CESAREAN SECTION  2006  . CHOLECYSTECTOMY N/A 11/03/2014   Procedure: LAPAROSCOPIC CHOLECYSTECTOMY WITH INTRAOPERATIVE CHOLANGIOGRAM;  Surgeon: Georganna Skeans, MD;  Location: [facility];  Service: General;  Laterality: N/A;  . LAPAROSCOPIC ASSISTED VAGINAL HYSTERECTOMY  03/27/2011   Procedure: LAPAROSCOPIC ASSISTED VAGINAL HYSTERECTOMY;  Surgeon: Cyril Mourning, MD;  Location: [facility];  Service: Gynecology;  Laterality: N/A;  . SALPINGOOPHORECTOMY  03/27/2011   Procedure: SALPINGO OOPHERECTOMY;  Surgeon: Cyril Mourning, MD;  Location: [facility];  Service: Gynecology;  Laterality: Bilateral;  . TONSILLECTOMY  92  . vaginal reconstructive surgery  2001    There were no vitals filed for this visit.  Subjective Assessment - 07/16/18 1434    Currently in Pain?  Other (Comment)   chronic pain      S: "I am always conscious of my body language so I know what message I am sending"   O: Education given on communication skills, breaking down into three parts: nonverbal, verbal, and listening. Nonverbal communication discussed in detail this date in regard to definition, appropriate nonverbals, and how to improve personal nonverbals. Video clip used to display variety of nonverbals to facilitate further discussion. Nonverbal communication continually described in reference to electronic communication. Pt encouraged to share personal strengths and weaknesses to improve current communication skills to increase independent community reintegration. Verbal and listening communication skills to be continued next date.   A: Pt presents to group with blunted affect, engaged and participatory throughout session. Reports understanding of education given this date. Pt shares that she always tries to be cognisant of her body language so she is aware what message she is sending to others. Pt shares she would like to work on her facial expressions and tone this date.  P: Pt provided with communication skills to implement when reintegrating into community dwelling. OT will continue follow up with communication  skills for successful implementation in daily life.                     OT Education - 07/16/18 1435    Education Details  education given on nonverbal communication skills    Person(s) Educated  Patient    Methods  Explanation;Handout    Comprehension  Verbalized understanding       OT Short Term Goals - 07/07/18 1538      OT SHORT TERM GOAL #1   Title  Pt will be educated on strategies to  improve psychosocial skills needed to participate fully in all daily, work, and leisure activities    Time  4    Period  Weeks    Status  On-going    Target Date  08/03/18      OT SHORT TERM GOAL #2   Title  Pt will apply psychosocial skills and coping mechanisms to daily activities in order to function independently and reintegrate into community dwelling    Time  4    Period  Weeks    Status  On-going    Target Date  08/03/18      OT SHORT TERM GOAL #3   Title  Pt will engage in goal setting to improve functional BADL/IADL routine upon reintegrating into community    Time  4    Period  Weeks    Status  On-going    Target Date  08/03/18      OT SHORT TERM GOAL #4   Title  Pt will create and implement functional BADL/IADL routine upon reintegrating into community for increased engagement in all daily, work, and leisure activities    Time  4    Period  Weeks    Status  On-going    Target Date  08/03/18      OT SHORT TERM GOAL #5   Title  Pt will choose and/or engage in 1-3 socially engaging leisure activities to improve social participation upon reintegrating into community    Time  4    Period  Weeks    Status  On-going    Target Date  08/03/18               Plan - 07/16/18 1435    Occupational performance deficits (Please refer to evaluation for details):  ADL's;IADL's;Rest and Sleep;Work;Leisure;Social Participation       Patient will benefit from skilled therapeutic intervention in order to improve the following deficits and impairments:  Decreased coping skills, Decreased psychosocial skills, Other (comment)(decreased ability to engage in BADL and reintegrate into community)  Visit Diagnosis: Organic personality disorder  Difficulty coping    Problem List Patient Active Problem List   Diagnosis Date Noted  . Severe recurrent major depression without psychotic features ([place]) 06/20/2018  . Hyperlipidemia LDL goal <100 02/21/2017  . High risk medication  use 02/21/2017  . Prediabetes 05/22/2016  . Allergic rhinitis due to pollen 07/19/2015  . Hypertension 02/01/2015  . Dyspnea 10/22/2012  . Diarrhea 09/16/2012  . Nausea alone 09/16/2012  . Abdominal pain, other specified site 09/16/2012  . ANAL FISSURE 04/11/2010  . CANDIDIASIS, VAGINAL 11/25/2008  . HEMORRHOIDS-EXTERNAL 11/25/2008  . RECTAL BLEEDING 11/25/2008  . ABDOMINAL PAIN-LUQ 11/25/2008  . NONSPECIFIC ABN FINDING RAD & OTH EXAM GI TRACT 11/25/2008  . Hypothyroidism 09/14/2007  . GERD 09/14/2007  . IBS 09/14/2007  . TONSILLECTOMY, HX OF 09/14/2007   Zenovia Jarred, MSOT, OTR/L Behavioral Health OT/ Acute Relief OT PHP Office:  [place] 07/16/2018, 2:36 PM  [facility] HOSPITALIZATION PROGRAM [place], [place], [address] Phone: [phone]   Fax:  [phone]  Name: KAISLEE CHAO MRN: [id number] Date of Birth: 1975-12-17

## 2018-07-17 ENCOUNTER — Other Ambulatory Visit (HOSPITAL_COMMUNITY): Payer: Medicare HMO | Admitting: Occupational Therapy

## 2018-07-17 ENCOUNTER — Other Ambulatory Visit (HOSPITAL_COMMUNITY): Payer: Medicare HMO | Admitting: Licensed Clinical Social Worker

## 2018-07-17 ENCOUNTER — Encounter (HOSPITAL_COMMUNITY): Payer: Self-pay | Admitting: Occupational Therapy

## 2018-07-17 DIAGNOSIS — F332 Major depressive disorder, recurrent severe without psychotic features: Secondary | ICD-10-CM

## 2018-07-17 DIAGNOSIS — F070 Personality change due to known physiological condition: Secondary | ICD-10-CM

## 2018-07-17 DIAGNOSIS — R4589 Other symptoms and signs involving emotional state: Secondary | ICD-10-CM

## 2018-07-17 DIAGNOSIS — F329 Major depressive disorder, single episode, unspecified: Secondary | ICD-10-CM | POA: Diagnosis not present

## 2018-07-17 NOTE — Progress Notes (Signed)
[facility] MD/PA/NP OP Progress Note  07/18/2018 4:10 PM Andrea [name]  MRN:  [id number]  Chief Complaint:  Andrea [name] reports " I am not in the best of moods today, I miss my son."  Evaluation: Patient observed attending daily group sessions with active and engaged participation.  Patient presents flat, guarded but pleasant.  States a decrease in appetite related to her depression, however reports she has been taken ensure between meals.  Andrea [name] reports she continues to have mood fluctuations with "good and bad days".  Andrea [name] denies suicidal or homicidal ideations, however express passive thought of death. Patient stated " if I do not wake up that will be fine."  Rates her depression  5  out of 10 with 10 being the worst during this assessment.  Reports she recently moved from a location where her son committed suicide, however continues to reports nightmares and crying spells.  Reports dealing with anger and constant sadness.  Patient reports she has a supportive church family and states she resides with her father. Reports her oldest son has been distance and he has shut down and closed off.  States she is unsure if he is seeking counseling at this time.  Discussed a follow-up with trauma based therapist after partial hospitalization program.  Patient encouraged to keep follow-up with primary care provider with hypertension medications and to consider EKG monitoring.  Denies chest pain, shortness of breath or dizziness. patient appeared agreeable to plan.  Support encouragement reassurance was provided.  History: Per assessment note:Andrea [name] is 43 year old Caucasian, divorced female who is recently discharged from behavioral health center after having suicidal thoughts and plan to shoot herself or taking overdose.  Patient found her 50 year old son deceased from shooting/hanging by his own hand on January 30.  Patient told it was shocking for her to see him to see his.  She tried to CPR him but apparently  he was already dead.  She saw blood, brain out of his head and recall whole thing was very graphic.  Her 75 year old son was autistic, seeing Andrea [name] for medication.  Patient was doing home schooling on him.  Patient do not recall anything prior to his suicide made him to do that.  She told that he undergo her son came out gay and apparently her son's father did not like that.  Apparently her son left a suicidal note and police is still investigating the suicide.   Visit Diagnosis:    ICD-10-CM   1. MDD (major depressive disorder), recurrent severe, without psychosis ([place]) F33.2     Past Psychiatric History:  Past Medical History:  Past Medical History:  Diagnosis Date  . Anxiety   . Arthritis    ruptured lumbar disc-careful with positioning  . Blood dyscrasia    protein s deficiency-no treatment since 2006  . Depression   . GERD (gastroesophageal reflux disease)   . Headache(784.0)   . Hemorrhoids   . High cholesterol   . Hyperlipemia   . Hypertension   . Hypothyroidism   . IBS (irritable bowel syndrome)   . MVP (mitral valve prolapse)    echo per dr Andrea [name]  . Protein S deficiency (Andrea [name]) 2003   dvt/[name] embolus-on coumadin for 6 months then off-Sees Andrea [name]  . [name] embolism (Andrea [name])   . PVC (premature ventricular contraction)     Past Surgical History:  Procedure Laterality Date  . CESAREAN SECTION  2006  . CHOLECYSTECTOMY N/A 11/03/2014   Procedure: LAPAROSCOPIC CHOLECYSTECTOMY WITH INTRAOPERATIVE CHOLANGIOGRAM;  Surgeon:  Andrea Skeans, MD;  Location: [name];  Service: General;  Laterality: N/A;  . LAPAROSCOPIC ASSISTED VAGINAL HYSTERECTOMY  03/27/2011   Procedure: LAPAROSCOPIC ASSISTED VAGINAL HYSTERECTOMY;  Surgeon: Andrea Mourning, MD;  Location: [name];  Service: Gynecology;  Laterality: N/A;  . SALPINGOOPHORECTOMY  03/27/2011   Procedure: SALPINGO OOPHERECTOMY;  Surgeon: Andrea Mourning, MD;  Location: [name];  Service: Gynecology;  Laterality:  Bilateral;  . TONSILLECTOMY  92  . vaginal reconstructive surgery  2001    Family Psychiatric History:  Family History:  Family History  Problem Relation Age of Onset  . High blood pressure Mother   . Anxiety disorder Mother   . Depression Mother   . OCD Mother   . Physical abuse Mother   . Hyperlipidemia Father        fathers side of family  . High blood pressure Father   . ADD / ADHD Son   . Seizures Son   . ADD / ADHD Son   . Hypertension Other        entire family on both sides  . Asthma Son   . Asthma Son   . Clotting disorder Maternal Uncle   . Clotting disorder Paternal Grandmother   . Heart disease Maternal Grandfather   . Alcohol abuse Maternal Grandfather   . Anxiety disorder Maternal Grandmother     Social History:  Social History   Socioeconomic History  . Marital status: Divorced    Spouse name: Not on file  . Number of children: 2  . Years of education: Not on file  . Highest education level: Not on file  Occupational History  . Occupation: nutrition    Employer: [name]  Social Needs  . Financial resource strain: Not hard at all  . Food insecurity:    Worry: Never true    Inability: Never true  . Transportation needs:    Medical: No    Non-medical: No  Tobacco Use  . Smoking status: Former Smoker    Packs/day: 1.00    Years: 15.00    Pack years: 15.00    Types: Cigarettes, E-cigarettes    Last attempt to quit: 03/18/2004    Years since quitting: 14.3  . Smokeless tobacco: Never Used  . Tobacco comment: States she vaps several times a weeks but no nicotine in the ones she uses  Substance and Sexual Activity  . Alcohol use: Never    Frequency: Never  . Drug use: No  . Sexual activity: Not Currently  Lifestyle  . Physical activity:    Days per week: 3 days    Minutes per session: 30 min  . Stress: To some extent  Relationships  . Social connections:    Talks on phone: More than three times a week    Gets together:  More than three times a week    Attends religious service: Never    Active member of club or organization: No    Attends meetings of clubs or organizations: Never    Relationship status: Divorced  Other Topics Concern  . Not on file  Social History Narrative   Diet: Regular   Caffeine: Yes   Married- divorced, married in 2006   House: Yes, 2 persons   Pets: 1 dog   Current/Past profession: [employer] 2002-2013   Exercise: Yes, walking   Living Will: No    DNR: No    POA/HPOA: No        Allergies: No Known Allergies  Metabolic Disorder Labs: Lab Results  Component Value Date   HGBA1C 5.6 06/21/2018   MPG 114.02 06/21/2018   MPG 114 06/13/2017   No results found for: PROLACTIN Lab Results  Component Value Date   CHOL 258 (H) 06/21/2018   TRIG 185 (H) 06/21/2018   HDL 50 06/21/2018   CHOLHDL 5.2 06/21/2018   VLDL 37 06/21/2018   LDLCALC 171 (H) 06/21/2018   LDLCALC 172 (H) 03/13/2018   Lab Results  Component Value Date   TSH 3.264 06/21/2018   TSH 0.26 (L) 03/13/2018    Therapeutic Level Labs: No results found for: LITHIUM No results found for: VALPROATE No components found for:  CBMZ  Current Medications: Current Outpatient Medications  Medication Sig Dispense Refill  . ALPRAZolam (XANAX XR) 2 MG 24 hr tablet Take 1 tablet (2 mg total) by mouth daily. For anxiety 5 tablet 0  . ALPRAZolam (XANAX) 0.5 MG tablet Take 1 tablet (0.5 mg total) by mouth 2 (two) times daily as needed for anxiety. 10 tablet 0  . aspirin EC 81 MG tablet Take 81 mg by mouth daily.    . cholecalciferol (VITAMIN D3) 25 MCG (1000 UT) tablet Take 1 tablet (1,000 Units total) by mouth daily. For bone health 5 tablet 0  . colestipol (COLESTID) 1 g tablet Take 2 tablets (2 g total) by mouth 2 (two) times daily. For high cholesterol 10 tablet 0  . dicyclomine (BENTYL) 20 MG tablet Take 1 tablet (20 mg total) by mouth 4 (four) times daily -  before meals and at bedtime. For bowel spasms  (Patient not taking: Reported on 07/06/2018) 15 tablet 0  . esomeprazole (NEXIUM) 40 MG capsule Take 1 capsule (40 mg total) by mouth daily. For acid reflux 5 capsule 0  . FLUoxetine (PROZAC) 40 MG capsule Take 2 capsules (80 mg total) by mouth daily. For depression 60 capsule 0  . metoprolol tartrate (LOPRESSOR) 25 MG tablet Take 1 tablet (25 mg total) by mouth 2 (two) times daily. For hypertension 5 tablet 0  . rosuvastatin (CRESTOR) 20 MG tablet Take one tablet by mouth once daily: For high cholesterol 5 tablet 0  . SYNTHROID 125 MCG tablet TAKE 1 TABLET(125 MCG) BY MOUTH DAILY BEFORE BREAKFAST: For thyroid hormone replacement 5 tablet 0  . traZODone (DESYREL) 100 MG tablet Take 2 tablets (200 mg total) by mouth at bedtime as needed for sleep. 30 tablet 0   No current facility-administered medications for this visit.      Musculoskeletal: Strength & Muscle Tone: within normal limits Gait & Station: normal Patient leans: N/A  Psychiatric Specialty Exam: Review of Systems  Psychiatric/Behavioral: Positive for depression. Negative for suicidal ideas. The patient is nervous/anxious and has insomnia.   All other systems reviewed and are negative.   Last menstrual period 03/19/2011.There is no height or weight on file to calculate BMI.  General Appearance: Casual  Eye Contact:  Good  Speech:  Clear and Coherent  Volume:  Normal  Mood:  Angry and Depressed  Affect:  Congruent  Thought Process:  Coherent  Orientation:  Full (Time, Place, and Person)  Thought Content: Logical   Suicidal Thoughts:  No  Homicidal Thoughts:  No  Memory:  Immediate;   Fair Recent;   Fair Remote;   Fair  Judgement:  Fair  Insight:  Fair  Psychomotor Activity:  Normal  Concentration:  Concentration: Fair  Recall:  [employer] of Knowledge: Fair  Language: Fair  Akathisia:  No  Handed:  Right  AIMS (if indicated):  Assets:  Communication Skills Desire for Improvement Resilience Social Support   ADL's:  Intact  Cognition: WNL  Sleep:  Fair reported 5-6 broken sleep   Screenings: AIMS     Admission (Discharged) from OP Visit from 06/20/2018 in [facility]  AIMS Total Score  0    AUDIT     Admission (Discharged) from OP Visit from 06/20/2018 in [facility]  Alcohol Use Disorder Identification Test Final Score (AUDIT)  0    GAD-7     Counselor from 07/17/2018 in [facility]  Total GAD-7 Score  16    PHQ2-9     Counselor from 07/06/2018 in [facility] Most recent reading at 07/06/2018  1:39 PM Counselor from 07/17/2018 in [facility] Most recent reading at 07/06/2018  9:00 AM Abstract from confidential encounter on 09/07/2014 Most recent reading at 09/07/2014  8:52 AM  PHQ-2 Total Score  6  4  0  PHQ-9 Total Score  25  18  -       Assessment and Plan:  Continue partial hospitalization program  Continue Ensure 8 oz nutritional supplement for hydration Keep follow-up with primary care provider as reported for HTN medication and consider EKG monitoring Continue medications as directed Stepping down to intensive outpatient service 3/6 tentative -Consider trauma based therapists follow-up  Treatment plan was reviewed and agreed upon by NP T. Lewis and patient Danial back to more need for continued group services    Derrill Center, NP 07/18/2018, 4:10 PM

## 2018-07-17 NOTE — Therapy (Signed)
[facility] [place], [place], [address] Phone: [phone]   Fax:  [phone]  Occupational Therapy Treatment  Patient Details  Name: Andrea [name] MRN: [id number] Date of Birth: [birth year] Referring Provider (OT): Ricky Ala, NP   Encounter Date: 07/17/2018  OT End of Session - 07/17/18 1327    Visit Number  8    Number of Visits  16    Date for OT Re-Evaluation  08/03/18    Authorization Type  [employer] medicare    OT Start Time  1100    OT Stop Time  1200    OT Time Calculation (min)  60 min    Activity Tolerance  Patient tolerated treatment well    Behavior During Therapy  [facility] for tasks assessed/performed       Past Medical History:  Diagnosis Date  . Anxiety   . Arthritis    ruptured lumbar disc-careful with positioning  . Blood dyscrasia    protein s deficiency-no treatment since 2006  . Depression   . GERD (gastroesophageal reflux disease)   . Headache(784.0)   . Hemorrhoids   . High cholesterol   . Hyperlipemia   . Hypertension   . Hypothyroidism   . IBS (irritable bowel syndrome)   . MVP (mitral valve prolapse)    echo per dr Dagmar Hait  . Protein S deficiency ([name]) 2003   dvt/pulmonary embolus-on coumadin for 6 months then off-Sees [facility]  . Pulmonary embolism ([place])   . PVC (premature ventricular contraction)     Past Surgical History:  Procedure Laterality Date  . CESAREAN SECTION  2006  . CHOLECYSTECTOMY N/A 11/03/2014   Procedure: LAPAROSCOPIC CHOLECYSTECTOMY WITH INTRAOPERATIVE CHOLANGIOGRAM;  Surgeon: Georganna Skeans, MD;  Location: [facility];  Service: General;  Laterality: N/A;  . LAPAROSCOPIC ASSISTED VAGINAL HYSTERECTOMY  03/27/2011   Procedure: LAPAROSCOPIC ASSISTED VAGINAL HYSTERECTOMY;  Surgeon: Cyril Mourning, MD;  Location: [facility];  Service: Gynecology;  Laterality: N/A;  . SALPINGOOPHORECTOMY  03/27/2011   Procedure: SALPINGO OOPHERECTOMY;  Surgeon: Cyril Mourning, MD;  Location: [facility];  Service: Gynecology;  Laterality: Bilateral;  . TONSILLECTOMY  92  . vaginal reconstructive surgery  2001    There were no vitals filed for this visit.  Subjective Assessment - 07/17/18 1326    Currently in Pain?  Other (Comment)   chronic pain      S: "I need to improve my use of silence"   O: OT treatment focus on communication skills, with emphasis of session on active listening. Ice breaker activity given with focus of active listening in pairs given at start of session. Pt received handout to guide understanding of definition of active listening and how to improve skills this date. Pt asked to share personal experiences and examples throughout session. Video clip shown as a real life example of active listening.   A: Pt presented to group with blunted affect, engaged and participatory throughout session. Pt engaged in ice breaker, needing min-mod VC's to complete activity successfully. Pt received active listening handout, in understanding of ways to improve current practices. Pt wanting to use the skill "silence" to increase this. She feels as if she has not been using it enough. Pt in understanding of video shown.  P: Pt provided with communication skills to implement when reintegrating into community dwelling. OT will continue follow up with communication skills for successful implementation in daily life  OT Education - 07/17/18 1326    Education Details  education given on active listening communication skills    Person(s) Educated  Patient    Methods  Explanation;Handout    Comprehension  Verbalized understanding       OT Short Term Goals - 07/07/18 1538      OT SHORT TERM GOAL #1   Title  Pt will be educated on strategies to improve psychosocial skills needed to participate fully in all daily, work, and leisure activities    Time  4    Period  Weeks    Status  On-going    Target Date  08/03/18       OT SHORT TERM GOAL #2   Title  Pt will apply psychosocial skills and coping mechanisms to daily activities in order to function independently and reintegrate into community dwelling    Time  4    Period  Weeks    Status  On-going    Target Date  08/03/18      OT SHORT TERM GOAL #3   Title  Pt will engage in goal setting to improve functional BADL/IADL routine upon reintegrating into community    Time  4    Period  Weeks    Status  On-going    Target Date  08/03/18      OT SHORT TERM GOAL #4   Title  Pt will create and implement functional BADL/IADL routine upon reintegrating into community for increased engagement in all daily, work, and leisure activities    Time  4    Period  Weeks    Status  On-going    Target Date  08/03/18      OT SHORT TERM GOAL #5   Title  Pt will choose and/or engage in 1-3 socially engaging leisure activities to improve social participation upon reintegrating into community    Time  4    Period  Weeks    Status  On-going    Target Date  08/03/18               Plan - 07/17/18 1327    Occupational performance deficits (Please refer to evaluation for details):  ADL's;IADL's;Rest and Sleep;Work;Leisure;Social Participation       Patient will benefit from skilled therapeutic intervention in order to improve the following deficits and impairments:  Decreased coping skills, Decreased psychosocial skills, Other (comment)(decreased ability to engage in BADL and reintegrate into community)  Visit Diagnosis: Organic personality disorder  Difficulty coping    Problem List Patient Active Problem List   Diagnosis Date Noted  . Severe recurrent major depression without psychotic features ([place]) 06/20/2018  . Hyperlipidemia LDL goal <100 02/21/2017  . High risk medication use 02/21/2017  . Prediabetes 05/22/2016  . Allergic rhinitis due to pollen 07/19/2015  . Hypertension 02/01/2015  . Dyspnea 10/22/2012  . Diarrhea 09/16/2012  . Nausea alone  09/16/2012  . Abdominal pain, other specified site 09/16/2012  . ANAL FISSURE 04/11/2010  . CANDIDIASIS, VAGINAL 11/25/2008  . HEMORRHOIDS-EXTERNAL 11/25/2008  . RECTAL BLEEDING 11/25/2008  . ABDOMINAL PAIN-LUQ 11/25/2008  . NONSPECIFIC ABN FINDING RAD & OTH EXAM GI TRACT 11/25/2008  . Hypothyroidism 09/14/2007  . GERD 09/14/2007  . IBS 09/14/2007  . TONSILLECTOMY, HX OF 09/14/2007   Zenovia Jarred, MSOT, OTR/L Behavioral Health OT/ Acute Relief OT PHP Office: [phone]  Zenovia Jarred 07/17/2018, 1:27 PM  [facility] PARTIAL HOSPITALIZATION PROGRAM [place], [place], [address] Phone: [phone]  Fax:  [phone]  Name: Andrea [name] MRN: [id number] Date of Birth: 1976-01-21

## 2018-07-17 NOTE — Psych (Signed)
[facility] PHP THERAPIST PROGRESS NOTE  [name] [phone]  Session Time: 9:00 - 11:00   Participation Level: Active  Behavioral Response: CasualAlertDepressed  Type of Therapy: Group Therapy  Treatment Goals addressed: Coping  Interventions: CBT, DBT, Solution Focused, Supportive and Reframing  Summary: [name] is a 43 y.o. female who presents with depression, trauma, and grief symptoms. Patient arrived within time allowed and reports that she is "in physical pain." Patient rates her mood "in the negatives" on a scale of 1-10 with 10 being great. Pt reports she went back to her house yesterday to pack her room and that was difficult because her Andrea [name] is a "shrine to him." Pt states her son was her "entire world." Pt reports she believes he left things in her room for her to find stating "it was intentional. He put it here for me to find it," "he knows I would have to find it." Pt states finding a pair of hair clippers under stuff on her floor, and a boombox she gave him in the corner of her room. Pt is not open to the challenge that those things might have been there before and she hadn't noticed, or that they were placed there for a benign reason. Pt remains intent on finding meaning and clues. Pt states multiple times throughout session that she is "trying to avoid all triggers" of her son because "I just can't handle it right now" and does not seem to connect that going to the house is triggering. Pt further shares that she dreamt of her son last night and he was mean to her in the dream which was upsetting. Pt states she "forced" herself awake  and got up to go to the bathroom. Pt reports she felt "light headed" and fell into the tub hurting her body and getting wet because the shower hadn't drained properly. Pt states feeling sore today because of the fall. Pt is intermittently tearful throughout session. Patient engaged in discussion.       Session Time: 11:00  -12:00  Participation Level:Active  Behavioral Response:CasualAlertDepressed  Type of Therapy: Group Therapy, OT  Treatment Goals addressed: Coping  Interventions:Psychosocial skills training, Supportive,   Summary:Occupational Therapy group  Therapist Response: Patient engaged in group. See OT note.        Session Time: 12:00 - 12:45  Participation Level: Active  Behavioral Response: CasualAlertDepressed  Type of Therapy: Group Therapy  Treatment Goals addressed: Coping  Interventions: Systems analyst, Supportive  Summary:  Reflection Group: Patients encouraged to practice skills and interpersonal techniques or work on mindfulness and relaxation techniques. The importance of self-care and making skills part of a routine to increase usage were stressed   Therapist Response: Patient engaged and participated appropriately.         Session Time: 12:45 - 2:00  Participation Level: Active  Behavioral Response: CasualAlertDepressed  Type of Therapy: Group Therapy, Psychoeducation, psychotherapy  Treatment Goals addressed: Coping  Interventions: CBT, DBT, solution focused, Supportive; Reframing  Summary:12:45 - 1:50 Cln introduced topic of distress tolerance reviewing their purpose and how they are used. Cln introduced ACCEPTS skill. Group discussed "A-C-C" and how they can utilize the skills in the present moment.    1:50 - 12:00 Clinician led check-out. Clinician assessed for immediate needs, medication compliance and efficacy, and safety concerns.   Therapist Response: Patient engaged in group. Pt identified listening to music as a way to practice "A," activities.    At [name], patient rates her  mood at a 4.5 on a scale of 1-10 with 10 being great. Patient reports afternoon plans of spending time with her dog. Patient demonstrates some progress as evidenced by continued recognition that treatment is important for  her at this time. Patient denies SI/HI/self-harm at the end of group.    Suicidal/Homicidal: Nowithout intent/plan  Plan: Pt will continue in PHP while working to decrease depression, trauma, and grief symptoms and increase ability to self manage all symptoms in a healthy manner as they arise.    Diagnosis: MDD (major depressive disorder), recurrent severe, without psychosis ([place]) [F33.2]    1. MDD (major depressive disorder), recurrent severe, without psychosis ([place])   2. Post traumatic stress disorder (PTSD)       Lorin Glass, LCSW 07/17/2018

## 2018-07-18 ENCOUNTER — Encounter (HOSPITAL_COMMUNITY): Payer: Self-pay | Admitting: Family

## 2018-07-20 ENCOUNTER — Other Ambulatory Visit (HOSPITAL_COMMUNITY): Payer: Medicare HMO | Attending: Psychiatry | Admitting: Licensed Clinical Social Worker

## 2018-07-20 ENCOUNTER — Encounter (HOSPITAL_COMMUNITY): Payer: Self-pay | Admitting: Occupational Therapy

## 2018-07-20 ENCOUNTER — Other Ambulatory Visit (HOSPITAL_COMMUNITY): Payer: Medicare HMO | Admitting: Occupational Therapy

## 2018-07-20 DIAGNOSIS — Z8349 Family history of other endocrine, nutritional and metabolic diseases: Secondary | ICD-10-CM | POA: Insufficient documentation

## 2018-07-20 DIAGNOSIS — K219 Gastro-esophageal reflux disease without esophagitis: Secondary | ICD-10-CM | POA: Insufficient documentation

## 2018-07-20 DIAGNOSIS — Z8249 Family history of ischemic heart disease and other diseases of the circulatory system: Secondary | ICD-10-CM | POA: Insufficient documentation

## 2018-07-20 DIAGNOSIS — I10 Essential (primary) hypertension: Secondary | ICD-10-CM | POA: Insufficient documentation

## 2018-07-20 DIAGNOSIS — F4310 Post-traumatic stress disorder, unspecified: Secondary | ICD-10-CM

## 2018-07-20 DIAGNOSIS — F070 Personality change due to known physiological condition: Secondary | ICD-10-CM | POA: Insufficient documentation

## 2018-07-20 DIAGNOSIS — F332 Major depressive disorder, recurrent severe without psychotic features: Secondary | ICD-10-CM

## 2018-07-20 DIAGNOSIS — Z86711 Personal history of pulmonary embolism: Secondary | ICD-10-CM | POA: Insufficient documentation

## 2018-07-20 DIAGNOSIS — Z79899 Other long term (current) drug therapy: Secondary | ICD-10-CM | POA: Diagnosis not present

## 2018-07-20 DIAGNOSIS — E039 Hypothyroidism, unspecified: Secondary | ICD-10-CM | POA: Diagnosis not present

## 2018-07-20 DIAGNOSIS — M4696 Unspecified inflammatory spondylopathy, lumbar region: Secondary | ICD-10-CM | POA: Insufficient documentation

## 2018-07-20 DIAGNOSIS — Z818 Family history of other mental and behavioral disorders: Secondary | ICD-10-CM | POA: Diagnosis not present

## 2018-07-20 DIAGNOSIS — I341 Nonrheumatic mitral (valve) prolapse: Secondary | ICD-10-CM | POA: Diagnosis not present

## 2018-07-20 DIAGNOSIS — Z7982 Long term (current) use of aspirin: Secondary | ICD-10-CM | POA: Insufficient documentation

## 2018-07-20 DIAGNOSIS — F419 Anxiety disorder, unspecified: Secondary | ICD-10-CM | POA: Insufficient documentation

## 2018-07-20 DIAGNOSIS — R4589 Other symptoms and signs involving emotional state: Secondary | ICD-10-CM

## 2018-07-20 DIAGNOSIS — Z7901 Long term (current) use of anticoagulants: Secondary | ICD-10-CM | POA: Diagnosis not present

## 2018-07-20 DIAGNOSIS — Z9049 Acquired absence of other specified parts of digestive tract: Secondary | ICD-10-CM | POA: Diagnosis not present

## 2018-07-20 DIAGNOSIS — Z87891 Personal history of nicotine dependence: Secondary | ICD-10-CM | POA: Insufficient documentation

## 2018-07-20 DIAGNOSIS — Z79890 Hormone replacement therapy (postmenopausal): Secondary | ICD-10-CM | POA: Insufficient documentation

## 2018-07-20 DIAGNOSIS — E785 Hyperlipidemia, unspecified: Secondary | ICD-10-CM | POA: Diagnosis not present

## 2018-07-20 DIAGNOSIS — R69 Illness, unspecified: Secondary | ICD-10-CM | POA: Diagnosis not present

## 2018-07-20 NOTE — Therapy (Signed)
[place], [place], [address] Phone: [phone]   Fax:  [phone]  Occupational Therapy Treatment  Patient Details  Name: Andrea [name] MRN: [id number] Date of Birth: [birth year] Referring Provider (OT): Ricky Ala, NP   Encounter Date: 07/20/2018  OT End of Session - 07/20/18 1329    Visit Number  9    Number of Visits  16    Date for OT Re-Evaluation  08/03/18    Authorization Type  [employer] medicare    OT Start Time  1100    OT Stop Time  1200    OT Time Calculation (min)  60 min    Activity Tolerance  Patient tolerated treatment well    Behavior During Therapy  [facility] for tasks assessed/performed       Past Medical History:  Diagnosis Date  . Anxiety   . Arthritis    ruptured lumbar disc-careful with positioning  . Blood dyscrasia    protein s deficiency-no treatment since 2006  . Depression   . GERD (gastroesophageal reflux disease)   . Headache(784.0)   . Hemorrhoids   . High cholesterol   . Hyperlipemia   . Hypertension   . Hypothyroidism   . IBS (irritable bowel syndrome)   . MVP (mitral valve prolapse)    echo per dr Dagmar Hait  . Protein S deficiency ([place]) 2003   dvt/pulmonary embolus-on coumadin for 6 months then off-Sees [facility]  . Pulmonary embolism ([place])   . PVC (premature ventricular contraction)     Past Surgical History:  Procedure Laterality Date  . CESAREAN SECTION  2006  . CHOLECYSTECTOMY N/A 11/03/2014   Procedure: LAPAROSCOPIC CHOLECYSTECTOMY WITH INTRAOPERATIVE CHOLANGIOGRAM;  Surgeon: Georganna Skeans, MD;  Location: [facility];  Service: General;  Laterality: N/A;  . LAPAROSCOPIC ASSISTED VAGINAL HYSTERECTOMY  03/27/2011   Procedure: LAPAROSCOPIC ASSISTED VAGINAL HYSTERECTOMY;  Surgeon: Cyril Mourning, MD;  Location: [facility];  Service: Gynecology;  Laterality: N/A;  . SALPINGOOPHORECTOMY  03/27/2011   Procedure: SALPINGO OOPHERECTOMY;  Surgeon: Cyril Mourning, MD;  Location: [facility];  Service: Gynecology;  Laterality: Bilateral;  . TONSILLECTOMY  92  . vaginal reconstructive surgery  2001    There were no vitals filed for this visit.  Subjective Assessment - 07/20/18 1329    Currently in Pain?  Other (Comment)   chronic pain       S: "I am just having a really bad day today"   O: Education given on goal setting to help improve routine management and increase motivation while reintegrating into daily routines. Pt to choose from the following areas: work/school, social participation, physical health, family, leisure, and personality. Pt to create immediate, short, and medium term goals based off the SMART framework. Pt asked to share and discuss at end of session. Blank planner handouts given for pt to continue goal setting in daily routine.   A: Pt presents to group with flat/depressed/tearful affect, limited in engagement. Pt focuses all goals on grieving of her son's death and fixating on his death until crying and asking to not continue with activity. OT will follow up as pt is more open to setting goals.   P: OT group will be 4x per week while pt in PHP.                   OT Education - 07/20/18 1329    Education Details  education given on goal setting techniques  Person(s) Educated  Patient    Methods  Explanation;Handout    Comprehension  Verbalized understanding       OT Short Term Goals - 07/07/18 1538      OT SHORT TERM GOAL #1   Title  Pt will be educated on strategies to improve psychosocial skills needed to participate fully in all daily, work, and leisure activities    Time  4    Period  Weeks    Status  On-going    Target Date  08/03/18      OT SHORT TERM GOAL #2   Title  Pt will apply psychosocial skills and coping mechanisms to daily activities in order to function independently and reintegrate into community dwelling    Time  4    Period  Weeks    Status  On-going    Target Date   08/03/18      OT SHORT TERM GOAL #3   Title  Pt will engage in goal setting to improve functional BADL/IADL routine upon reintegrating into community    Time  4    Period  Weeks    Status  On-going    Target Date  08/03/18      OT SHORT TERM GOAL #4   Title  Pt will create and implement functional BADL/IADL routine upon reintegrating into community for increased engagement in all daily, work, and leisure activities    Time  4    Period  Weeks    Status  On-going    Target Date  08/03/18      OT SHORT TERM GOAL #5   Title  Pt will choose and/or engage in 1-3 socially engaging leisure activities to improve social participation upon reintegrating into community    Time  4    Period  Weeks    Status  On-going    Target Date  08/03/18               Plan - 07/20/18 1330    Occupational performance deficits (Please refer to evaluation for details):  ADL's;IADL's;Rest and Sleep;Work;Leisure;Social Participation    Pt will benefit from skilled therapeutic intervention in order to improve on the following performance deficits  Psychosocial Skills;Body Structure / Function / Physical Skills;Cognitive Skills    Body Structure / Function / Physical Skills  ADL    Cognitive Skills  Emotional;Energy/Drive;Temperament/Personality    Psychosocial Skills  Coping Strategies;Routines and Behaviors;Habits;Environmental  Adaptations;Interpersonal Interaction       Patient will benefit from skilled therapeutic intervention in order to improve the following deficits and impairments:     Visit Diagnosis: Organic personality disorder  Difficulty coping    Problem List Patient Active Problem List   Diagnosis Date Noted  . Severe recurrent major depression without psychotic features ([place]) 06/20/2018  . Hyperlipidemia LDL goal <100 02/21/2017  . High risk medication use 02/21/2017  . Prediabetes 05/22/2016  . Allergic rhinitis due to pollen 07/19/2015  . Hypertension 02/01/2015  .  Dyspnea 10/22/2012  . Diarrhea 09/16/2012  . Nausea alone 09/16/2012  . Abdominal pain, other specified site 09/16/2012  . ANAL FISSURE 04/11/2010  . CANDIDIASIS, VAGINAL 11/25/2008  . HEMORRHOIDS-EXTERNAL 11/25/2008  . RECTAL BLEEDING 11/25/2008  . ABDOMINAL PAIN-LUQ 11/25/2008  . NONSPECIFIC ABN FINDING RAD & OTH EXAM GI TRACT 11/25/2008  . Hypothyroidism 09/14/2007  . GERD 09/14/2007  . IBS 09/14/2007  . TONSILLECTOMY, HX OF 09/14/2007   Zenovia Jarred, MSOT, OTR/L Behavioral Health OT/ Acute Relief OT PHP Office: [phone]  Zenovia Jarred 07/20/2018, 1:35 PM  [facility] HOSPITALIZATION PROGRAM [place], [place], [address] Phone: [phone]   Fax:  [phone]  Name: Andrea [name] MRN: [id number] Date of Birth: 1976/02/17

## 2018-07-21 ENCOUNTER — Other Ambulatory Visit (HOSPITAL_COMMUNITY): Payer: Medicare HMO | Admitting: Occupational Therapy

## 2018-07-21 ENCOUNTER — Encounter (HOSPITAL_COMMUNITY): Payer: Self-pay | Admitting: Occupational Therapy

## 2018-07-21 ENCOUNTER — Other Ambulatory Visit (HOSPITAL_COMMUNITY): Payer: Medicare HMO | Admitting: Licensed Clinical Social Worker

## 2018-07-21 DIAGNOSIS — F4310 Post-traumatic stress disorder, unspecified: Secondary | ICD-10-CM

## 2018-07-21 DIAGNOSIS — F070 Personality change due to known physiological condition: Secondary | ICD-10-CM

## 2018-07-21 DIAGNOSIS — R4589 Other symptoms and signs involving emotional state: Secondary | ICD-10-CM

## 2018-07-21 DIAGNOSIS — F332 Major depressive disorder, recurrent severe without psychotic features: Secondary | ICD-10-CM

## 2018-07-21 NOTE — Progress Notes (Signed)
[place] MD/PA/NP OP Progress Note  07/22/2018 9:36 PM Andrea [name]  MRN:  [id number]  Evaluation: Andrea [name] was reassessed at the request of the treatment team.  As reported patient continues to express feelings of sadness and worsening depression.  Patient reports " at times I get very overwhelmed when I think about my son's death it was very traumatic to find him like that" states she has been talking to his spirit more often.  States she feels she may have "PTSD" related to recurrent vivid dreams.  Reports decreased appetite.  Reports interrupted sleep.  Andrea [name] is currently denying suicidal intent or plan.  However does state " if I do not wake up that will be okay."  Patient was offered inpatient readmission.  However has declined.  Patient has provided verbal permission for NP to contact her father for concrete safety plan.  No answer at number provided.  Patient continues to adamantly deny suicidal intent or plan.  She is able to contract for safety.  NP to follow-up 3/4.   -Patient was reassessed today.  Continues to present flat, guarded but pleasant.  Patient is able to express her thoughts and feelings related to her mood and depression.  Discussed patient to follow-up with trauma based therapist and psychiatry.  Discussed stepping down to intensive outpatient programming after completing therapy sessions.  Patient was receptive to the plan.  Andrea [name] was encouraged to follow-up with primary care provider by for medical clearance before initiating [facility] to nightmares, however patient states she has not been able to follow-up at this time.  Patient to continue partial hospitalization program with anticipated discharge 3/6.  Support encouragement reassurance was provided.    History: Per assessment note on admission to PHP-Hind is 43 year old Caucasian, divorced female who is recently discharged from behavioral health center after having suicidal thoughts and plan to shoot herself or  taking overdose.  Patient found her 31 year old son deceased from shooting/hanging by his own hand on January 30.  Patient told it was shocking for her to see him to see his.  She tried to CPR him but apparently he was already dead.  She saw blood, brain out of his head and recall whole thing was very graphic.  Her 52 year old son was autistic, seeing Andrea [name] for medication.  Patient was doing home schooling on him.  Patient do not recall anything prior to his suicide made him to do that.  She told that he undergo her son came out gay and apparently her son's father did not like that.     Visit Diagnosis:    ICD-10-CM   1. MDD (major depressive disorder), recurrent severe, without psychosis ([place]) F33.2   2. 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 Anxiety   . Arthritis    ruptured lumbar disc-careful with positioning  . Blood dyscrasia    protein s deficiency-no treatment since 2006  . Depression   . GERD (gastroesophageal reflux disease)   . Headache(784.0)   . Hemorrhoids   . High cholesterol   . Hyperlipemia   . Hypertension   . Hypothyroidism   . IBS (irritable bowel syndrome)   . MVP (mitral valve prolapse)    echo per dr Dagmar Hait  . Protein S deficiency ([place]) 2003   dvt/pulmonary embolus-on coumadin for 6 months then off-Sees [facility]  . Pulmonary embolism ([place])   . PVC (premature ventricular contraction)  Past Surgical History:  Procedure Laterality Date  . CESAREAN SECTION  2006  . CHOLECYSTECTOMY N/A 11/03/2014   Procedure: LAPAROSCOPIC CHOLECYSTECTOMY WITH INTRAOPERATIVE CHOLANGIOGRAM;  Surgeon: Georganna Skeans, MD;  Location: [facility];  Service: General;  Laterality: N/A;  . LAPAROSCOPIC ASSISTED VAGINAL HYSTERECTOMY  03/27/2011   Procedure: LAPAROSCOPIC ASSISTED VAGINAL HYSTERECTOMY;  Surgeon: Cyril Mourning, MD;  Location: [facility];  Service: Gynecology;   Laterality: N/A;  . SALPINGOOPHORECTOMY  03/27/2011   Procedure: SALPINGO OOPHERECTOMY;  Surgeon: Cyril Mourning, MD;  Location: [facility];  Service: Gynecology;  Laterality: Bilateral;  . TONSILLECTOMY  92  . vaginal reconstructive surgery  2001    Family Psychiatric History:   Family History:  Family History  Problem Relation Age of Onset  . High blood pressure Mother   . Anxiety disorder Mother   . Depression Mother   . OCD Mother   . Physical abuse Mother   . Hyperlipidemia Father        fathers side of family  . High blood pressure Father   . ADD / ADHD Son   . Seizures Son   . ADD / ADHD Son   . Hypertension Other        entire family on both sides  . Asthma Son   . Asthma Son   . Clotting disorder Maternal Uncle   . Clotting disorder Paternal Grandmother   . Heart disease Maternal Grandfather   . Alcohol abuse Maternal Grandfather   . Anxiety disorder Maternal Grandmother     Social History:  Social History   Socioeconomic History  . Marital status: Divorced    Spouse name: Not on file  . Number of children: 2  . Years of education: Not on file  . Highest education level: Not on file  Occupational History  . Occupation: nutrition    Employer: [employer]  Social Needs  . Financial resource strain: Not hard at all  . Food insecurity:    Worry: Never true    Inability: Never true  . Transportation needs:    Medical: No    Non-medical: No  Tobacco Use  . Smoking status: Former Smoker    Packs/day: 1.00    Years: 15.00    Pack years: 15.00    Types: Cigarettes, E-cigarettes    Last attempt to quit: 03/18/2004    Years since quitting: 14.3  . Smokeless tobacco: Never Used  . Tobacco comment: States she vaps several times a weeks but no nicotine in the ones she uses  Substance and Sexual Activity  . Alcohol use: Never    Frequency: Never  . Drug use: No  . Sexual activity: Not Currently  Lifestyle  . Physical activity:    Days per  week: 3 days    Minutes per session: 30 min  . Stress: To some extent  Relationships  . Social connections:    Talks on phone: More than three times a week    Gets together: More than three times a week    Attends religious service: Never    Active member of club or organization: No    Attends meetings of clubs or organizations: Never    Relationship status: Divorced  Other Topics Concern  . Not on file  Social History Narrative   Diet: Regular   Caffeine: Yes   Married- divorced, married in 2006   House: Yes, 2 persons   Pets: 1 dog   Current/Past profession: [employer] [phone]  Exercise: Yes, walking   Living Will: No    DNR: No    POA/HPOA: No        Allergies: No Known Allergies  Metabolic Disorder Labs: Lab Results  Component Value Date   HGBA1C 5.6 06/21/2018   MPG 114.02 06/21/2018   MPG 114 06/13/2017   No results found for: PROLACTIN Lab Results  Component Value Date   CHOL 258 (H) 06/21/2018   TRIG 185 (H) 06/21/2018   HDL 50 06/21/2018   CHOLHDL 5.2 06/21/2018   VLDL 37 06/21/2018   LDLCALC 171 (H) 06/21/2018   LDLCALC 172 (H) 03/13/2018   Lab Results  Component Value Date   TSH 3.264 06/21/2018   TSH 0.26 (L) 03/13/2018    Therapeutic Level Labs: No results found for: LITHIUM No results found for: VALPROATE No components found for:  CBMZ  Current Medications: Current Outpatient Medications  Medication Sig Dispense Refill  . ALPRAZolam (XANAX XR) 2 MG 24 hr tablet Take 1 tablet (2 mg total) by mouth daily. For anxiety 5 tablet 0  . ALPRAZolam (XANAX) 0.5 MG tablet Take 1 tablet (0.5 mg total) by mouth 2 (two) times daily as needed for anxiety. 10 tablet 0  . aspirin EC 81 MG tablet Take 81 mg by mouth daily.    . cholecalciferol (VITAMIN D3) 25 MCG (1000 UT) tablet Take 1 tablet (1,000 Units total) by mouth daily. For bone health 5 tablet 0  . colestipol (COLESTID) 1 g tablet Take 2 tablets (2 g total) by mouth 2 (two) times daily. For  high cholesterol 10 tablet 0  . dicyclomine (BENTYL) 20 MG tablet Take 1 tablet (20 mg total) by mouth 4 (four) times daily -  before meals and at bedtime. For bowel spasms (Patient not taking: Reported on 07/06/2018) 15 tablet 0  . esomeprazole (NEXIUM) 40 MG capsule Take 1 capsule (40 mg total) by mouth daily. For acid reflux 5 capsule 0  . FLUoxetine (PROZAC) 40 MG capsule Take 2 capsules (80 mg total) by mouth daily. For depression 60 capsule 0  . metoprolol tartrate (LOPRESSOR) 25 MG tablet Take 1 tablet (25 mg total) by mouth 2 (two) times daily. For hypertension 5 tablet 0  . rosuvastatin (CRESTOR) 20 MG tablet Take one tablet by mouth once daily: For high cholesterol 5 tablet 0  . SYNTHROID 125 MCG tablet TAKE 1 TABLET(125 MCG) BY MOUTH DAILY BEFORE BREAKFAST: For thyroid hormone replacement 5 tablet 0  . traZODone (DESYREL) 100 MG tablet Take 2 tablets (200 mg total) by mouth at bedtime as needed for sleep. 30 tablet 0   No current facility-administered medications for this visit.      Musculoskeletal: Strength & Muscle Tone: within normal limits Gait & Station: normal Patient leans: N/A  Psychiatric Specialty Exam: ROS  Last menstrual period 03/19/2011.There is no height or weight on file to calculate BMI.  General Appearance: Casual  Eye Contact:  Fair  Speech:  Clear and Coherent  Volume:  Normal  Mood:  Anxious  Affect:  Congruent  Thought Process:  Coherent  Orientation:  Full (Time, Place, and Person)  Thought Content: WDL   Suicidal Thoughts:  Yes.  without intent/plan  Homicidal Thoughts:  No  Memory:  Immediate;   Fair Recent;   Fair  Judgement:  Fair  Insight:  Fair  Psychomotor Activity:  Normal  Concentration:  Concentration: Fair  Recall:  [employer] of Knowledge: Fair  Language: Fair  Akathisia:  Negative  Handed:  Right  AIMS (if indicated):   Assets:  Communication Skills Desire for Improvement Resilience Social Support  ADL's:  Intact   Cognition: WNL  Sleep:  Poor   Screenings: AIMS     Admission (Discharged) from OP Visit from 06/20/2018 in [facility]  AIMS Total Score  0    AUDIT     Admission (Discharged) from OP Visit from 06/20/2018 in [facility]  Alcohol Use Disorder Identification Test Final Score (AUDIT)  0    GAD-7     Counselor from 07/17/2018 in [facility]  Total GAD-7 Score  16    PHQ2-9     Counselor from 07/06/2018 in [facility] Most recent reading at 07/06/2018  1:39 PM Counselor from 07/17/2018 in [facility] Most recent reading at 07/06/2018  9:00 AM Abstract from confidential encounter on 09/07/2014 Most recent reading at 09/07/2014  8:52 AM  PHQ-2 Total Score  6  4  0  PHQ-9 Total Score  25  18  -       Assessment and Plan:  Continue partial hospitalization program (PHP) Discharge disposition- 3/6 Follow-up with EMDR behavioral therapy and/or weekly therapy sessions- Trauma based  -Keep follow appointment with Primary Care Provider- EKG and review with  HTN mediations prior to initiating additional medications.    Treatment plan was reviewed and agreed upon by NP T. Bobby Rumpf and patient Trinaty Bundrick need for continued group services   [place]ill [place], NP 07/22/2018, 9:36 PM

## 2018-07-21 NOTE — Psych (Signed)
   [facility] PHP THERAPIST PROGRESS NOTE  Andrea [name] [id number]  Session Time: 9:00 - 11:00   Participation Level: Active  Behavioral Response: CasualAlertDepressed  Type of Therapy: Group Therapy  Treatment Goals addressed: Coping  Interventions: CBT, DBT, Solution Focused, Supportive and Reframing  Summary: Andrea [name] is a 43 y.o. female who presents with depression, trauma, and grief symptoms. Patient arrived within time allowed and reports that she is "still in physical pain from fall". Patient rates her mood at a 3 on a scale of 1-10 with 10 being great. Pt states she "feels like she was hit by truck" from fall two nights ago, but that her father is telling her to "get over it, that she doesn't have a concussion". Pt. states "yesterday afternoon was rough", mother picked her up, took her to her house to pick up clothes. Pt. states her mother is "fake" and she is acting caring when she usually is not and "that's driving me nuts". Pt states while she was in the home, she became angry and began throwing away son's crayons and pencils. Pt. states that she recognizes she has "waves of emotions" that range from anger to depression and she told her father "don't let me spiral" if she is isolating. Patient continues to struggle with mood regulation. Patient engaged in discussion.       Session Time: 11:00 -12:15  Participation Level: Active  Behavioral Response: CasualAlertDepressed  Type of Therapy: Group Therapy, psychotherapy  Treatment Goals addressed: Coping  Interventions: Strengths based, reframing, Supportive,   Summary:  Spiritual Care group  Therapist Response: Patient engaged in group. See chaplain note.         Session Time: 12:15 - 1:00  Participation Level: Active  Behavioral Response: CasualAlertDepressed  Type of Therapy: Group Therapy  Treatment Goals addressed: Coping  Interventions: Systems analyst,  Supportive  Summary:  Reflection Group: Patients encouraged to practice skills and interpersonal techniques or work on mindfulness and relaxation techniques. The importance of self-care and making skills part of a routine to increase usage were stressed   Therapist Response: Patient engaged and participated appropriately.         Session Time: 1:00- 2:00  Participation Level: Active  Behavioral Response: CasualAlertDepressed  Type of Therapy: Group Therapy, Psychoeducation  Treatment Goals addressed: Coping  Interventions: relaxation training; Supportive; Reframing  Summary: 12:45 - 1:50: Relaxation group: Cln led group focused on retraining the body's response to stress.   1:50 -2:00 Clinician led check-out. Clinician assessed for immediate needs, medication compliance and efficacy, and safety concerns   Therapist Response: Patient engaged in activity and discussion. At [place], patient rates her mood at a 4 on a scale of 1-10 with 10 being great. Patient reports afternoon plans to go to dog groomer, sign lease for apartment, and pick up personal items from house. Patient demonstrates some progress as evidenced by reaching out to support system when upset. Patient denies SI/HI/self-harm thoughts at the end of group.       Suicidal/Homicidal: Nowithout intent/plan  Plan: Pt will continue in PHP while working to decrease depression, trauma, and grief symptoms and increase ability to self manage all symptoms in a healthy manner as they arise.    Diagnosis: MDD (major depressive disorder), recurrent severe, without psychosis ([place]) [F33.2]    1. MDD (major depressive disorder), recurrent severe, without psychosis ([place])   2. Post traumatic stress disorder (PTSD)       Lorin Glass, LCSW 07/21/2018

## 2018-07-21 NOTE — Therapy (Signed)
[facility] [place], [place], [address] Phone: [phone]   Fax:  [phone]  Occupational Therapy Treatment  Patient Details  Name: SATOMI BUDA MRN: [id number] Date of Birth: [birth year] Referring Provider (OT): Ricky Ala, NP   Encounter Date: 07/21/2018  OT End of Session - 07/21/18 1329    Visit Number  10    Number of Visits  16    Date for OT Re-Evaluation  08/03/18    Authorization Type  [employer] medicare    OT Start Time  1100    OT Stop Time  1200    OT Time Calculation (min)  60 min    Activity Tolerance  Patient tolerated treatment well    Behavior During Therapy  [facility] for tasks assessed/performed       Past Medical History:  Diagnosis Date  . Anxiety   . Arthritis    ruptured lumbar disc-careful with positioning  . Blood dyscrasia    protein s deficiency-no treatment since 2006  . Depression   . GERD (gastroesophageal reflux disease)   . Headache(784.0)   . Hemorrhoids   . High cholesterol   . Hyperlipemia   . Hypertension   . Hypothyroidism   . IBS (irritable bowel syndrome)   . MVP (mitral valve prolapse)    echo per dr Dagmar Hait  . Protein S deficiency ([place]) 2003   dvt/pulmonary embolus-on coumadin for 6 months then off-Sees [facility]  . Pulmonary embolism ([place])   . PVC (premature ventricular contraction)     Past Surgical History:  Procedure Laterality Date  . CESAREAN SECTION  2006  . CHOLECYSTECTOMY N/A 11/03/2014   Procedure: LAPAROSCOPIC CHOLECYSTECTOMY WITH INTRAOPERATIVE CHOLANGIOGRAM;  Surgeon: Georganna Skeans, MD;  Location: [facility];  Service: General;  Laterality: N/A;  . LAPAROSCOPIC ASSISTED VAGINAL HYSTERECTOMY  03/27/2011   Procedure: LAPAROSCOPIC ASSISTED VAGINAL HYSTERECTOMY;  Surgeon: Cyril Mourning, MD;  Location: [facility];  Service: Gynecology;  Laterality: N/A;  . SALPINGOOPHORECTOMY  03/27/2011   Procedure: SALPINGO OOPHERECTOMY;  Surgeon: Cyril Mourning, MD;  Location: [facility];  Service: Gynecology;  Laterality: Bilateral;  . TONSILLECTOMY  92  . vaginal reconstructive surgery  2001    There were no vitals filed for this visit.  Subjective Assessment - 07/21/18 1327    Currently in Pain?  Other (Comment)   chronic pain      S: "I have not had fun since my son died"   O: Education given on self care and its importance in regular BADL/IADL routine. Pt completed self care assessment to identify areas of strength and weakness. Self care assessments covered areas of physical health, psychological health, spiritual health, and professional health. Pt asked to identifies area of weakness within each area and develop plans for improvement this date. Pt encouraged to brainstorm with other peers to begin goal setting in areas of desired change.  ?  A: Pt presents to group with flat/depressed affect, withdrawn and minimally engaged without significant encouragement. Pt continuing to fixate on death of son in each area discussed at this time. She shares she would like to find new leisure activities that do not remind her of her son and find reasons to laugh again. Pt did not contribute much further throughout group.   P: Pt educated on importance of self care in BADL/IADL routine. OT will continue to follow up with pt each treatment session to ensure carryover into daily routine to facilitate successful community  integration. OT treatment will be 4 times a week                     OT Education - 07/21/18 1327    Education Details  education given on self care techniques    Person(s) Educated  Patient    Methods  Explanation;Handout    Comprehension  Verbalized understanding       OT Short Term Goals - 07/07/18 1538      OT SHORT TERM GOAL #1   Title  Pt will be educated on strategies to improve psychosocial skills needed to participate fully in all daily, work, and leisure activities    Time  4    Period  Weeks     Status  On-going    Target Date  08/03/18      OT SHORT TERM GOAL #2   Title  Pt will apply psychosocial skills and coping mechanisms to daily activities in order to function independently and reintegrate into community dwelling    Time  4    Period  Weeks    Status  On-going    Target Date  08/03/18      OT SHORT TERM GOAL #3   Title  Pt will engage in goal setting to improve functional BADL/IADL routine upon reintegrating into community    Time  4    Period  Weeks    Status  On-going    Target Date  08/03/18      OT SHORT TERM GOAL #4   Title  Pt will create and implement functional BADL/IADL routine upon reintegrating into community for increased engagement in all daily, work, and leisure activities    Time  4    Period  Weeks    Status  On-going    Target Date  08/03/18      OT SHORT TERM GOAL #5   Title  Pt will choose and/or engage in 1-3 socially engaging leisure activities to improve social participation upon reintegrating into community    Time  4    Period  Weeks    Status  On-going    Target Date  08/03/18               Plan - 07/21/18 1329    Occupational performance deficits (Please refer to evaluation for details):  ADL's;IADL's;Rest and Sleep;Work;Leisure;Social Participation    Pt will benefit from skilled therapeutic intervention in order to improve on the following performance deficits  Psychosocial Skills;Body Structure / Function / Physical Skills;Cognitive Skills    Body Structure / Function / Physical Skills  ADL    Cognitive Skills  Emotional;Energy/Drive;Temperament/Personality    Psychosocial Skills  Coping Strategies;Routines and Behaviors;Habits;Environmental  Adaptations;Interpersonal Interaction       Patient will benefit from skilled therapeutic intervention in order to improve the following deficits and impairments:     Visit Diagnosis: Organic personality disorder  Difficulty coping    Problem List Patient Active Problem List    Diagnosis Date Noted  . Severe recurrent major depression without psychotic features ([place]) 06/20/2018  . Hyperlipidemia LDL goal <100 02/21/2017  . High risk medication use 02/21/2017  . Prediabetes 05/22/2016  . Allergic rhinitis due to pollen 07/19/2015  . Hypertension 02/01/2015  . Dyspnea 10/22/2012  . Diarrhea 09/16/2012  . Nausea alone 09/16/2012  . Abdominal pain, other specified site 09/16/2012  . ANAL FISSURE 04/11/2010  . CANDIDIASIS, VAGINAL 11/25/2008  . HEMORRHOIDS-EXTERNAL 11/25/2008  . RECTAL BLEEDING 11/25/2008  .  ABDOMINAL PAIN-LUQ 11/25/2008  . NONSPECIFIC ABN FINDING RAD & OTH EXAM GI TRACT 11/25/2008  . Hypothyroidism 09/14/2007  . GERD 09/14/2007  . IBS 09/14/2007  . TONSILLECTOMY, HX OF 09/14/2007   Zenovia Jarred, MSOT, OTR/L Behavioral Health OT/ Acute Relief OT PHP Office: [phone]  Zenovia Jarred 07/21/2018, 1:38 PM  [facility] PARTIAL HOSPITALIZATION PROGRAM [place], [place], [address] Phone: [phone]   Fax:  [phone]  Name: LABRINA LINES MRN: [id number] Date of Birth: Feb 15, 1976

## 2018-07-21 NOTE — Psych (Signed)
[facility] BH PHP THERAPIST PROGRESS NOTE  BRE PECINA [id number]  Session Time: 9:00 - 11:00   Participation Level: Active  Behavioral Response: CasualAlertDepressed  Type of Therapy: Group Therapy  Treatment Goals addressed: Coping  Interventions: CBT, DBT, Solution Focused, Supportive and Reframing  Summary: Andrea [name] is a 43 y.o. female who presents with depression, trauma, and grief symptoms. Patient arrived within time allowed and reports that she had a "very very draining weekend." Patient rates her mood at a 5 on a scale of 1-10 with 10 being great. Pt states they did move the majority of their things into the new apartment this weekend and it was difficult. Pt states she cried for two hours outside of her son's room "waiting for him." Pt states she felt like she was having a stroke later in the evening because she couldn't move or talk, however in telling the story she was doing both. Pt states she felt "numb." Pt further shares that mom is a stressor and has been attending pt's church recently. Pt becomes agitated and angry discussing this and reports that mom is "fake" and "making a show" of caring for pt to get attention. Pt states being at church was "empty." Pt states mom "always does this" and "steals things from me." Pt is resistant to reframing about her mom and is mildly hostile in her belief that mom is horrible and pt is victimized. Pt states she is not eating except when her father forces her to, stating it took her 1.5 hours to eat 3 bites of dinner, however she is observed eating lunch in group. Pt states she is not sleeping well, reporting 3 hours last night. Pt exhibits labile mood throughout session. Patient engaged in discussion.       Session Time: 11:00 -12:00  Participation Level:Active  Behavioral Response:CasualAlertDepressed  Type of Therapy: Group Therapy, OT  Treatment Goals addressed: Coping  Interventions:Psychosocial skills  training, Supportive,   Summary:Occupational Therapy group  Therapist Response: Patient engaged in group. See OT note.        Session Time: 12:00 - 12:45  Participation Level: Active  Behavioral Response: CasualAlertDepressed  Type of Therapy: Group Therapy  Treatment Goals addressed: Coping  Interventions: Systems analyst, Supportive  Summary:  Reflection Group: Patients encouraged to practice skills and interpersonal techniques or work on mindfulness and relaxation techniques. The importance of self-care and making skills part of a routine to increase usage were stressed   Therapist Response: Patient engaged and participated appropriately.         Session Time: 12:45 - 2:00  Participation Level: Active  Behavioral Response: CasualAlertDepressed  Type of Therapy: Group Therapy, Psychoeducation, psychotherapy  Treatment Goals addressed: Coping  Interventions: CBT, DBT, solution focused, Supportive; Reframing  Summary: 12:45 - 1:50: Clinician continued topic of distress tolerance skills and introduced STOP, TIPP, and self soothe skills. Group discussed how to incorporate these skills into their every day lives. 1:50 -2:00 Clinician led check-out. Clinician assessed for immediate needs, medication compliance and efficacy, and safety concerns   Therapist Response: Patient engaged activity and discussion. Pt reports progressive muscle relaxation as a way to practice TIPP skill.  At [place], patient rates her mood at a 7 on a scale of 1-10 with 10 being great. Patient reports afternoon plans of taking her dog to the groomers and watching a show with her dad. Patient demonstrates some progress as evidenced by mood improvement throughout group. Patient denies SI/HI/self-harm at the end of group.  Suicidal/Homicidal: Nowithout intent/plan  Plan: Pt will continue in PHP while working to decrease depression, trauma, and grief  symptoms and increase ability to self manage all symptoms in a healthy manner as they arise.    Diagnosis: MDD (major depressive disorder), recurrent severe, without psychosis ([place]) [F33.2]    1. MDD (major depressive disorder), recurrent severe, without psychosis ([place])   2. Post traumatic stress disorder (PTSD)       Lorin Glass, LCSW 07/21/2018

## 2018-07-21 NOTE — Psych (Signed)
[facility] BH PHP THERAPIST PROGRESS NOTE  Andrea [name] [id number]  Session Time: 9:00 - 11:00   Participation Level: Active  Behavioral Response: CasualAlertDepressed  Type of Therapy: Group Therapy  Treatment Goals addressed: Coping  Interventions: CBT, DBT, Solution Focused, Supportive and Reframing  Summary: Andrea [name] is a 43 y.o. female who presents with depression, trauma, and grief symptoms. Patient arrived within time allowed and reports that she is "having a rough morning." Patient rates her mood at a 3 on a scale of 1-10 with 10 being great. Pt states she signed the lease on her new apartment yesterday and is already struggling because of the absence of her son's presence there. Pt reports her dad made her favorite meal for dinner and she spiraled down because her son hated this meal. Pt reports ruminating thoughts such as "who'd feeding my son now" and "I wasn't a good enough mother for him." Pt is difficult to redirect during this time and seems instint on ruminating on these negative thoughts. Pt states negative thought such as "I wasn't a good enough mother" and then minutes later will say "I was a perfect mother." Pt seems to be unaware of this and is confused when cln points it out.  Pt is intermittently tearful throughout session. Patient engaged in discussion.       Session Time: 11:00 -12:00  Participation Level:Active  Behavioral Response:CasualAlertDepressed  Type of Therapy: Group Therapy, OT  Treatment Goals addressed: Coping  Interventions:Psychosocial skills training, Supportive,   Summary:Occupational Therapy group  Therapist Response: Patient engaged in group. See OT note.        Session Time: 12:00 - 12:45  Participation Level: Active  Behavioral Response: CasualAlertDepressed  Type of Therapy: Group Therapy  Treatment Goals addressed: Coping  Interventions: Systems analyst,  Supportive  Summary:  Reflection Group: Patients encouraged to practice skills and interpersonal techniques or work on mindfulness and relaxation techniques. The importance of self-care and making skills part of a routine to increase usage were stressed   Therapist Response: Patient engaged and participated appropriately.         Session Time: 12:45 - 2:00  Participation Level: Active  Behavioral Response: CasualAlertDepressed  Type of Therapy: Group Therapy, Psychoeducation, psychotherapy  Treatment Goals addressed: Coping  Interventions: CBT, DBT, solution focused, Supportive; Reframing  Summary: 12:45 - 1:50: Clinician continued topic of distress tolerance skills and ACCEPTS. Group learned Different Emotion, Pushing away, Thoughts, and Sensation.  Pt's discussed how they can utilize these skills. 1:50 -2:00 Clinician led check-out. Clinician assessed for immediate needs, medication compliance and efficacy, and safety concerns   Therapist Response: Patient engaged activity and discussion. Pt reports listening to music as a way she can practice the Different Emotions skill.  At [place], patient rates her mood at a 6 on a scale of 1-10 with 10 being great. Patient reports afternoon plans of going to the grocery store and making dinner for her dad. Patient demonstrates limited progress as shown by vacillating willigness to engage in skills to manage thoughts and desire to wallow and stay in her negative thought patterns. Patient denies SI/HI/self-harm at the end of group.    Suicidal/Homicidal: Nowithout intent/plan  Plan: Pt will continue in PHP while working to decrease depression, trauma, and grief symptoms and increase ability to self manage all symptoms in a healthy manner as they arise.    Diagnosis: MDD (major depressive disorder), recurrent severe, without psychosis ([place]) [F33.2]    1. MDD (major depressive disorder),  recurrent severe, without  psychosis ([place])   2. Post traumatic stress disorder (PTSD)       Lorin Glass, LCSW 07/21/2018

## 2018-07-21 NOTE — Psych (Signed)
[facility] BH PHP THERAPIST PROGRESS NOTE  ELLARY CASAMENTO [id number]  Session Time: 9:00 - 10:15   Participation Level: Active  Behavioral Response: CasualAlertDepressed  Type of Therapy: Group Therapy  Treatment Goals addressed: Coping  Interventions: CBT, DBT, Solution Focused, Supportive and Reframing  Summary: DEMI TRIEU is a 43 y.o. female who presents with depression, trauma, and grief symptoms. Patient arrived within time allowed and reports that she is feeling good. Patient rates her mood at a 6 on a scale of 1-10 with 10 being great. Pt. reports she drove for the first time this morning since January and used music as a distraction while driving. Pt. states that she had to set boundaries with her mother yesterday because her mother was "freaking out about the snow" and was it was negatively affecting pt's mood. Pt. states someone from the sheriff's department called to her to "let me know that everyone in town knew about what happened with [son]". Pt. states that she used comparison skills when she heard this to remind herself "that I'm doing much better now than four weeks ago." Patient continues to struggle with communication. Patient engaged in discussion.         Session Time: 10:15 -11:00   Participation Level: Active   Behavioral Response: CasualAlertDepressed   Type of Therapy: Group Therapy, psychoeducation, psychotherapy   Treatment Goals addressed: Coping   Interventions: CBT, DBT, Solution Focused, Supportive and Reframing   Summary: Clinician led discussion on how to use reframing skills when negatively comparing yourself to other people or having negative self-talk. Patients discussed areas where they feel they could incorporate reframing skills.   Therapist Response: Patient engaged and participated in discussion. Pt. states she will use reframing skills when she begins to have self-blaming thoughts about what occurred with her  son.       Session Time: 11:00 -12:00  Participation Level:Active  Behavioral Response:CasualAlertDepressed  Type of Therapy: Group Therapy, OT  Treatment Goals addressed: Coping  Interventions:Psychosocial skills training, Supportive,   Summary:Occupational Therapy group  Therapist Response: Patient engaged in group. See OT note.        Session Time: 12:00 - 1:00  Participation Level: Active  Behavioral Response: CasualAlertDepressed  Type of Therapy: Group Therapy, Psychoeducation, psychotherapy  Treatment Goals addressed: Coping  Interventions: CBT, DBT, solution focused, Supportive; Reframing  Summary: 12:00 - 12:50: Clinician introduced topic of Positive Psychology. Group viewed TED talk and members discussed the five Positive Psychology skills: 3 gratitudes, positive journaling, exercise, meditation, and random acts of kindness.  12:50 -1:00 Clinician led check-out. Clinician assessed for immediate needs, medication compliance and efficacy, and safety concerns    Therapist Response: Patient engaged in discussion and states wanting to try 3 gratitudes for the next 21 days.  At [place], patient rates her mood at a 7 on a scale of 1-10 with 10 being great. Patient reports weekend plans of going to her house to pick up stuff, moving everything to the new apartment and going to church. Patient demonstrates some progress as evidenced by setting boundaries with mother. Patient denies SI/HI/self-harm at the end of group.      Suicidal/Homicidal: Nowithout intent/plan  Plan: Pt will continue in PHP while working to decrease depression, trauma, and grief symptoms and increase ability to self manage all symptoms in a healthy manner as they arise.    Diagnosis: MDD (major depressive disorder), recurrent severe, without psychosis ([place]) [F33.2]    1. MDD (major depressive disorder), recurrent severe, without psychosis (  [place])   2. Post  traumatic stress disorder (PTSD)       Lorin Glass, LCSW 07/21/2018

## 2018-07-22 ENCOUNTER — Encounter (HOSPITAL_COMMUNITY): Payer: Self-pay | Admitting: Family

## 2018-07-22 ENCOUNTER — Other Ambulatory Visit (HOSPITAL_COMMUNITY): Payer: Medicare HMO | Admitting: Licensed Clinical Social Worker

## 2018-07-22 DIAGNOSIS — F332 Major depressive disorder, recurrent severe without psychotic features: Secondary | ICD-10-CM | POA: Diagnosis not present

## 2018-07-22 DIAGNOSIS — F4310 Post-traumatic stress disorder, unspecified: Secondary | ICD-10-CM

## 2018-07-23 ENCOUNTER — Encounter (HOSPITAL_COMMUNITY): Payer: Self-pay

## 2018-07-23 ENCOUNTER — Other Ambulatory Visit (INDEPENDENT_AMBULATORY_CARE_PROVIDER_SITE_OTHER): Payer: Medicare HMO | Admitting: Occupational Therapy

## 2018-07-23 ENCOUNTER — Encounter (HOSPITAL_COMMUNITY): Payer: Self-pay | Admitting: Occupational Therapy

## 2018-07-23 ENCOUNTER — Other Ambulatory Visit: Payer: Self-pay

## 2018-07-23 ENCOUNTER — Other Ambulatory Visit (HOSPITAL_COMMUNITY): Payer: Medicare HMO | Admitting: Licensed Clinical Social Worker

## 2018-07-23 VITALS — BP 155/90 | HR 58 | Resp 16 | Wt 231.0 lb

## 2018-07-23 DIAGNOSIS — F332 Major depressive disorder, recurrent severe without psychotic features: Secondary | ICD-10-CM | POA: Diagnosis not present

## 2018-07-23 DIAGNOSIS — F4310 Post-traumatic stress disorder, unspecified: Secondary | ICD-10-CM

## 2018-07-23 DIAGNOSIS — F070 Personality change due to known physiological condition: Secondary | ICD-10-CM

## 2018-07-23 DIAGNOSIS — R4589 Other symptoms and signs involving emotional state: Secondary | ICD-10-CM

## 2018-07-23 NOTE — Psych (Signed)
[facility] BH PHP THERAPIST PROGRESS NOTE  AKEYA RYTHER [id number]  Session Time: 9:00 - 11:00   Participation Level: Active  Behavioral Response: CasualAlertDepressed  Type of Therapy: Group Therapy  Treatment Goals addressed: Coping  Interventions: CBT, DBT, Solution Focused, Supportive and Reframing  Summary: Andrea [name] is a 43 y.o. female who presents with depression, trauma, and grief symptoms. Patient arrived within time allowed and reports that she is having "an emotional day." Patient rates her mood at a 3 on a scale of 1-10 with 10 being great. Pt reports she woke up missing her son and on the drive here kept looking in the rearview mirror for him. Pt reports she did take her dog to the groomers and that it was good to see the groomer, with whom she is friendly. Pt reports the grromer did share with her that her son's father has a [employer] to "raise money for his son's burial" and this made pt very angry. Pt becomes visibly angry when discussing this. Pt states that her father's son was not active in his life and she felt he was exploiting their son's death. Pt reports she struggled to eat dinner again and would not have if dad had not "forced" her to. Pt reports slightly improved sleep reporting 4-5 hours last night. Pt is encouraged to use distraction skills instead of following thoughts about her son and pt is resistant and shut down to this encouragement. Patient engaged in discussion.       Session Time: 11:00 -12:00  Participation Level:Active  Behavioral Response:CasualAlertDepressed  Type of Therapy: Group Therapy, OT  Treatment Goals addressed: Coping  Interventions:Psychosocial skills training, Supportive,   Summary:Occupational Therapy group  Therapist Response: Patient engaged in group. See OT note.        Session Time: 12:00 - 12:45  Participation Level: Active  Behavioral Response: CasualAlertDepressed  Type  of Therapy: Group Therapy  Treatment Goals addressed: Coping  Interventions: Systems analyst, Supportive  Summary:  Reflection Group: Patients encouraged to practice skills and interpersonal techniques or work on mindfulness and relaxation techniques. The importance of self-care and making skills part of a routine to increase usage were stressed   Therapist Response: Patient engaged and participated appropriately.         Session Time: 12:45 - 2:00  Participation Level: Active  Behavioral Response: CasualAlertDepressed  Type of Therapy: Group Therapy, Psychoeducation, psychotherapy  Treatment Goals addressed: Coping  Interventions: CBT, DBT, solution focused, Supportive; Reframing  Summary: 12:45 - 1:50: Clinician introduced topic of cognitive distortions. Cln educated on what cognitive distortions are and how they affect [place]. Cln introduced "Catch, Challenge, Change" and group reviewed cognitive distortion handout and came up with examples to work on "catch." 1:50 -2:00 Clinician led check-out. Clinician assessed for immediate needs, medication compliance and efficacy, and safety concerns   Therapist Response: Patient engaged activity and discussion. Pt was able to identify real life examples of cognitive distortions discussed.  At [place], patient rates her mood at a 6 on a scale of 1-10 with 10 being great. Patient reports afternoon plans of taking her dog on a walk and going through boxes to unpack. Patient demonstrates some progress as evidenced by reporting she tried to set a boundary with her mom. Patient denies SI/HI/self-harm at the end of group.    Suicidal/Homicidal: Nowithout intent/plan  Plan: Pt will continue in PHP while working to decrease depression, trauma, and grief symptoms and increase ability to self manage all symptoms in  a healthy manner as they arise.    Diagnosis: MDD (major depressive disorder), recurrent severe, without  psychosis ([place]) [F33.2]    1. MDD (major depressive disorder), recurrent severe, without psychosis ([place])   2. Post traumatic stress disorder (PTSD)       Lorin Glass, LCSW 07/23/2018
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
## 2018-07-23 NOTE — Therapy (Addendum)
[facility] [place], [place], [address] Phone: [phone]   Fax:  [phone]  Occupational Therapy Treatment  Patient Details  Name: Andrea [name] MRN: [id number] Date of Birth: [birth year] Referring Provider (OT): Ricky Ala, NP   Encounter Date: 07/23/2018  OT End of Session - 07/23/18 1458    Visit Number  10    Number of Visits  16    Date for OT Re-Evaluation  08/03/18    Authorization Type  [employer] medicare       Past Medical History:  Diagnosis Date  . Anxiety   . Arthritis    ruptured lumbar disc-careful with positioning  . Blood dyscrasia    protein s deficiency-no treatment since 2006  . Depression   . GERD (gastroesophageal reflux disease)   . Headache(784.0)   . Hemorrhoids   . High cholesterol   . Hyperlipemia   . Hypertension   . Hypothyroidism   . IBS (irritable bowel syndrome)   . MVP (mitral valve prolapse)    echo per dr Dagmar Hait  . Protein S deficiency ([place]) 2003   dvt/pulmonary embolus-on coumadin for 6 months then off-Sees [facility]  . Pulmonary embolism ([place])   . PVC (premature ventricular contraction)     Past Surgical History:  Procedure Laterality Date  . CESAREAN SECTION  2006  . CHOLECYSTECTOMY N/A 11/03/2014   Procedure: LAPAROSCOPIC CHOLECYSTECTOMY WITH INTRAOPERATIVE CHOLANGIOGRAM;  Surgeon: Georganna Skeans, MD;  Location: [facility];  Service: General;  Laterality: N/A;  . LAPAROSCOPIC ASSISTED VAGINAL HYSTERECTOMY  03/27/2011   Procedure: LAPAROSCOPIC ASSISTED VAGINAL HYSTERECTOMY;  Surgeon: Cyril Mourning, MD;  Location: [facility];  Service: Gynecology;  Laterality: N/A;  . SALPINGOOPHORECTOMY  03/27/2011   Procedure: SALPINGO OOPHERECTOMY;  Surgeon: Cyril Mourning, MD;  Location: [facility];  Service: Gynecology;  Laterality: Bilateral;  . TONSILLECTOMY  92  . vaginal reconstructive surgery  2001    There were no vitals filed for this  visit.  Subjective Assessment - 07/23/18 1454    Currently in Pain?  Other (Comment)   chronic pain        No charge this date, pt arrived. Was with RN for entirety of session.                  OT Education - 07/23/18 1454    Education Details  education given on sleep hygiene    Person(s) Educated  Patient    Methods  Explanation;Handout    Comprehension  Verbalized understanding       OT Short Term Goals - 07/24/18 1302      OT SHORT TERM GOAL #1   Title  Pt will be educated on strategies to improve psychosocial skills needed to participate fully in all daily, work, and leisure activities    Time  4    Period  Weeks    Status  Achieved    Target Date  08/03/18      OT SHORT TERM GOAL #2   Title  Pt will apply psychosocial skills and coping mechanisms to daily activities in order to function independently and reintegrate into community dwelling    Time  4    Period  Weeks    Status  Achieved    Target Date  08/03/18      OT SHORT TERM GOAL #3   Title  Pt will engage in goal setting to improve functional BADL/IADL routine upon reintegrating into community  Time  4    Period  Weeks    Status  Achieved    Target Date  08/03/18      OT SHORT TERM GOAL #4   Title  Pt will create and implement functional BADL/IADL routine upon reintegrating into community for increased engagement in all daily, work, and leisure activities    Time  4    Period  Weeks    Status  Achieved    Target Date  08/03/18      OT SHORT TERM GOAL #5   Title  Pt will choose and/or engage in 1-3 socially engaging leisure activities to improve social participation upon reintegrating into community    Time  4    Period  Weeks    Status  Achieved    Target Date  08/03/18               Plan - 07/23/18 1455    Occupational performance deficits (Please refer to evaluation for details):  ADL's;IADL's;Rest and Sleep;Work;Leisure;Social Participation    Body Structure /  Function / Physical Skills  ADL    Cognitive Skills  Emotional;Energy/Drive;Temperament/Personality    Psychosocial Skills  Coping Strategies;Routines and Behaviors;Habits;Environmental  Adaptations;Interpersonal Interaction       Patient will benefit from skilled therapeutic intervention in order to improve the following deficits and impairments:  Psychosocial Skills, Body Structure / Function / Physical Skills, Cognitive Skills  Visit Diagnosis: Organic personality disorder  Difficulty coping    Problem List Patient Active Problem List   Diagnosis Date Noted  . Severe recurrent major depression without psychotic features ([place]) 06/20/2018  . Hyperlipidemia LDL goal <100 02/21/2017  . High risk medication use 02/21/2017  . Prediabetes 05/22/2016  . Allergic rhinitis due to pollen 07/19/2015  . Hypertension 02/01/2015  . Dyspnea 10/22/2012  . Diarrhea 09/16/2012  . Nausea alone 09/16/2012  . Abdominal pain, other specified site 09/16/2012  . ANAL FISSURE 04/11/2010  . CANDIDIASIS, VAGINAL 11/25/2008  . HEMORRHOIDS-EXTERNAL 11/25/2008  . RECTAL BLEEDING 11/25/2008  . ABDOMINAL PAIN-LUQ 11/25/2008  . NONSPECIFIC ABN FINDING RAD & OTH EXAM GI TRACT 11/25/2008  . Hypothyroidism 09/14/2007  . GERD 09/14/2007  . IBS 09/14/2007  . TONSILLECTOMY, HX OF 09/14/2007    OCCUPATIONAL THERAPY DISCHARGE SUMMARY  Visits from Start of Care: 10  Current functional level related to goals / functional outcomes: Pt is stepping down to IOP level of care for continued maintenance of MH symptoms   Remaining deficits: Continuing to use coping skills, grief   Education / Equipment: Education given on psychosocial skills as it applies to BADL/IADL function and reintegrating into community Plan: Patient agrees to discharge.  Patient goals were met. Patient is being discharged due to meeting the stated rehab goals.  ?????        Zenovia Jarred, MSOT, OTR/L Behavioral Health OT/ Acute  Relief OT PHP Office: [place] 07/24/2018, 1:03 PM  [facility] HOSPITALIZATION PROGRAM [place], [place], [address] Phone: [phone]   Fax:  [phone]  Name: Andrea [name] MRN: [id number] Date of Birth: 02/16/1976

## 2018-07-24 ENCOUNTER — Ambulatory Visit (HOSPITAL_COMMUNITY): Payer: Self-pay

## 2018-07-24 ENCOUNTER — Telehealth (HOSPITAL_COMMUNITY): Payer: Self-pay | Admitting: Professional

## 2018-07-24 ENCOUNTER — Telehealth (HOSPITAL_COMMUNITY): Payer: Self-pay | Admitting: Licensed Clinical Social Worker

## 2018-07-24 ENCOUNTER — Other Ambulatory Visit (HOSPITAL_COMMUNITY): Payer: Self-pay

## 2018-07-24 NOTE — Telephone Encounter (Signed)
Cln responded to call from pt. Pt required clarification of d/c plan as she is out for her last day of PHP. Pt reports some confusion about d/c plan and states she thought she was not going to have follow up set up so did her own plan. Pt reports plan to attend Hospice grief therapy and services at [facility]. Cln informed pt that her decision about next steps is her own and she can do what she thinks best. Cln informed pt that typically Hospice grief counseling and [facility] services are recommended concurrently with IOP or after discharge from IOP. Cln states concurrent services is an option as long as they do not conflcit with IOP times of 9-12 M-F. Cln informed pt that she has an IOP appt to begin 3/9 at 9am or if she declines IOP there is an OP therapy appt 3/16 at 10am. Cln stressed this was either/or. Cln encouraged pt that if she chooses not to do IOP, to engage in OP therapy as [facility] does not provide therapeutic services, only supportive services. Pt reports understanding and states she will talk to her father and make a decision. Cln informed pt that if she has further questions, the office is open until 1 today, and otherwise, to call and leave her decision on voicemail so we can cancel whichever appointments necessary.

## 2018-07-27 ENCOUNTER — Encounter (HOSPITAL_COMMUNITY): Payer: Self-pay | Admitting: Psychiatry

## 2018-07-27 ENCOUNTER — Other Ambulatory Visit (HOSPITAL_COMMUNITY): Payer: Self-pay

## 2018-07-27 ENCOUNTER — Telehealth (HOSPITAL_COMMUNITY): Payer: Self-pay | Admitting: Professional

## 2018-07-27 ENCOUNTER — Other Ambulatory Visit (HOSPITAL_COMMUNITY): Payer: Medicare HMO | Admitting: Licensed Clinical Social Worker

## 2018-07-27 DIAGNOSIS — F332 Major depressive disorder, recurrent severe without psychotic features: Secondary | ICD-10-CM

## 2018-07-27 NOTE — Progress Notes (Signed)
Andrea [name] is a 43 yr old, divorced, unemployed, Caucasian female who transitioned from [facility]  As most recent CCA states:  Pt presents to PHP per inpt. Pt was inpt due to SI after son killed himself last month. Pt found son and still dealing with the trauma. Pt reports "my son was my life. He told me everything and he went everywhere with me. I can't go to the same places now because it reminds me of him. I need a new grocery store, pharmacy, everything. I never went anywhere without him." Pt reports she wants to live and needs to gain coping skills to handle her symptoms. Pt shares she has "lifelong" SI and wants to live. Pt shares she is "shocked, numb, and angry" about son's death. Pt reports hx of MDD, Social Anxiety, GAD, and Agoraphobia. Pt has hx of therapy and psychiatry. Pt prefers new therapist; "my old therapist stepped over many boundaries while I was in the hospital and I want someone new." Pt wants to stay with Dr. Toy Care for psychiatry. Pt has hx of cutting self and 1 "almost attempt" at the age of 68; pt had pills ready to take when her cat walked into the room and the thought of "who will take care of [name]" stopped pt. Pt denies HI/AVH at this time; pt reports passive SI "but I want to live for the first time in a long time. I look at suicide differently now." Patients Currently Reported Symptoms/Problems: increased depression and anxiety symptoms; decreased appetite and sleep; racing thoughts; confusion; memory problems; anhedonia; irritability; excessive worrying; isolation; passive SI; low energy; lack of motivation. CC: previous inpatient chart for detailed hx. Pt started in [place] today.  Reports she feels as though she is still in a daze.  Her deceased son's [name] was this past Saturday.  According to pt, it was very beautiful. Pt continues to be going through the stages of grief.  "My father has to do everything for me."  Pt is looking forward to her first appt with Hospice  Grief Group on 08-17-18. Admits to passive SI; denies a plan or intent.  States her deterrent is her father and other son.  Discussed safety options with pt.  Pt is able to contract for safety.  A:  Oriented pt to MH-IOP.  Provided pt with an orientation folder.  Encouraged support groups.  Discussed the stages of grief.  Inform Dr. Toy Care of admit.  Pt has a new pt appt with Lise Auer, LCSW on 08-12-18 @ 9 a.m.  Strongly recommend not only pt following up with Hospice, but also f/u with [facility].  Provide pt with support as needed.  R:  Patient receptive.       Carlis Abbott, RITA, M.Ed,CNA

## 2018-07-27 NOTE — Psych (Signed)
[facility] BH PHP THERAPIST PROGRESS NOTE  Andrea [name] [id number]  Session Time: 9:00 - 11:00   Participation Level: Active  Behavioral Response: CasualAlertDepressed  Type of Therapy: Group Therapy  Treatment Goals addressed: Coping  Interventions: CBT, DBT, Solution Focused, Supportive and Reframing  Summary: Andrea [name] is a 43 y.o. female who presents with depression, trauma, and grief symptoms. Patient arrived within time allowed and reports that she is feeling "very anxious during the drive to group". Patient rates her mood at a 3 on a scale of 1-10 with 10 being great. Pt states she went to her old house yesterday and parked away from her son's window because she "can't look at it." Pt. states that she went in the house and found a walking stick that her son made and felt very emotional and had to leave the house. Pt reports her father was with her the entire time she was at the house, and cln challenged why pt went when it was unnecessary to do so and it is so upsetting for her. Pt states she did not trust her dad to take down her posters "the right way." Cln pointed out that pt is putting her emotional safety below the safety of her posters. Pt is resistant to this point. Pt. states she still doesn't want to attend the memorial service for her son because she still can't look at pictures of her son and doesn't want to deal with people "being fake." Pt is unwilling to reframe the thought of "everyone being fake." Patient continues to struggle with willingness. Patient engaged in discussion.       Session Time: 11:00 -12:00  Participation Level:Active  Behavioral Response:CasualAlertDepressed  Type of Therapy: Group Therapy, OT  Treatment Goals addressed: Coping  Interventions:Psychosocial skills training, Supportive,   Summary:Occupational Therapy group  Therapist Response: Patient engaged in group. See OT note.        Session Time:  12:00 - 12:45  Participation Level: Active  Behavioral Response: CasualAlertDepressed  Type of Therapy: Group Therapy  Treatment Goals addressed: Coping  Interventions: Systems analyst, Supportive  Summary:  Reflection Group: Patients encouraged to practice skills and interpersonal techniques or work on mindfulness and relaxation techniques. The importance of self-care and making skills part of a routine to increase usage were stressed   Therapist Response: Patient engaged and participated appropriately.         Session Time: 12:45 - 2:00  Participation Level: Minimal  Behavioral Response: CasualAlertDepressed  Type of Therapy: Group Therapy, Psychoeducation, psychotherapy  Treatment Goals addressed: Coping  Interventions: CBT, DBT, solution focused, Supportive; Reframing  Summary: 12:45 - 1:50: Cln continued topic of cognitive distortions. Group discussed how to "challenge" the unhealthy thought patterns once recognized. Group reviewed "Socratic Questions" handout and went over how to challenge irrational thoughts using that handout. 1:50 -2:00 Clinician led check-out. Clinician assessed for immediate needs, medication compliance and efficacy, and safety concerns   Therapist Response: Patient minimally engaged in activity and discussion. Pt reports understanding and did not offer personal examples to challenge. At [place], patient rates her mood at a 5 on a scale of 1-10 with 10 being great. Patient reports afternoon plans of hanging out with dog and watching T.V. Patient demonstrates some progress as evidenced by using distractions skills at times, however struggles to use them when most needed. Patient denies SI/HI/self-harm thoughts at the end of group.      Suicidal/Homicidal: Nowithout intent/plan  Plan: Pt will continue in PHP while  working to decrease depression, trauma, and grief symptoms and increase ability to self manage all  symptoms in a healthy manner as they arise.    Diagnosis: MDD (major depressive disorder), recurrent severe, without psychosis ([place]) [F33.2]    1. MDD (major depressive disorder), recurrent severe, without psychosis ([place])   2. Post traumatic stress disorder (PTSD)       Lorin Glass, LCSW 07/27/2018

## 2018-07-27 NOTE — Progress Notes (Signed)
Psychiatric Initial Adult Assessment -IOP  Patient Identification: Andrea [name] MRN:  [id number] Date of Evaluation:  07/28/2018 Referral Source: Stepdown from partial hospitalization program Chief Complaint:   Chief Complaint    Depression; Anxiety     Visit Diagnosis:    ICD-10-CM   1. MDD (major depressive disorder), recurrent severe, without psychosis ([place]) F33.2     History of Present Illness:Andrea [name] recently completed partial hospitalization program on 07/24/2018 as she was previously admitted to inpatient facility for worsening depression anxiety and suicidal ideation.  Andrea [name] to transition to intensive outpatient programming on 07/27/2018.  Evaluation: Andrea [name] continues to report "good and bad days."  Reports thoughts of suicidal ideations denies intent or plan.  Reports taking medications as prescribed and tolerating them well.  Patient reports she currently resides with her father who is her biggest  support system.  [name] reports a strained relationship between her and her mother and oldest son (23 years). Patient to continue Intensive outpatient programming (IOP) with planes to transition to a trauma based therapist and/or EMDR specialist.  Support encouragement reassurance was provided  Per assessment note: Per assessment note on admission to PHP-[name] is 43 year old Caucasian, divorced female who is recently discharged from behavioral health center after having suicidal thoughts and plan to shoot herself or taking overdose. Patient found her 14 year old son deceased from shooting/hanging by his own hand on January 30. Patient told it was shocking for her to see him to see his. She tried to CPR him but apparently he was already dead. She saw blood, brain out of his head and recall whole thing was very graphic. Her 73 year old son was autistic, seeing Andrea [name] for medication. Patient was doing home schooling on him. Patient do not recall anything prior to his  suicide made him to do that. She told that he undergo her son came out gay and apparently her son's father did not like that.   Associated Signs/Symptoms: Depression Symptoms:  feelings of worthlessness/guilt, difficulty concentrating, (Hypo) Manic Symptoms:  Distractibility, Anxiety Symptoms:  Excessive Worry, Psychotic Symptoms:  Hallucinations: None PTSD Symptoms: NA  Past Psychiatric History:   Previous Psychotropic Medications: No   Substance Abuse History in the last 12 months:  No.  Consequences of Substance Abuse: NA  Past Medical History:  Past Medical History:  Diagnosis Date  . Anxiety   . Arthritis    ruptured lumbar disc-careful with positioning  . Blood dyscrasia    protein s deficiency-no treatment since 2006  . Depression   . GERD (gastroesophageal reflux disease)   . Headache(784.0)   . Hemorrhoids   . High cholesterol   . Hyperlipemia   . Hypertension   . Hypothyroidism   . IBS (irritable bowel syndrome)   . MVP (mitral valve prolapse)    echo per dr Dagmar Hait  . Protein S deficiency ([place]) 2003   dvt/pulmonary embolus-on coumadin for 6 months then off-Sees [facility]  . Pulmonary embolism ([place])   . PVC (premature ventricular contraction)     Past Surgical History:  Procedure Laterality Date  . CESAREAN SECTION  2006  . CHOLECYSTECTOMY N/A 11/03/2014   Procedure: LAPAROSCOPIC CHOLECYSTECTOMY WITH INTRAOPERATIVE CHOLANGIOGRAM;  Surgeon: Georganna Skeans, MD;  Location: [facility];  Service: General;  Laterality: N/A;  . LAPAROSCOPIC ASSISTED VAGINAL HYSTERECTOMY  03/27/2011   Procedure: LAPAROSCOPIC ASSISTED VAGINAL HYSTERECTOMY;  Surgeon: Cyril Mourning, MD;  Location: [facility];  Service: Gynecology;  Laterality: N/A;  . SALPINGOOPHORECTOMY  03/27/2011   Procedure: SALPINGO OOPHERECTOMY;  Surgeon:  Cyril Mourning, MD;  Location: [facility];  Service: Gynecology;  Laterality: Bilateral;  . TONSILLECTOMY  92  . vaginal reconstructive surgery  2001     Family Psychiatric History:   Family History:  Family History  Problem Relation Age of Onset  . High blood pressure Mother   . Anxiety disorder Mother   . Depression Mother   . OCD Mother   . Physical abuse Mother   . Hyperlipidemia Father        fathers side of family  . High blood pressure Father   . ADD / ADHD Son   . Seizures Son   . ADD / ADHD Son   . Hypertension Other        entire family on both sides  . Asthma Son   . Asthma Son   . Clotting disorder Maternal Uncle   . Clotting disorder Paternal Grandmother   . Heart disease Maternal Grandfather   . Alcohol abuse Maternal Grandfather   . Anxiety disorder Maternal Grandmother     Social History:   Social History   Socioeconomic History  . Marital status: Divorced    Spouse name: Not on file  . Number of children: 2  . Years of education: Not on file  . Highest education level: Not on file  Occupational History  . Occupation: nutrition    Employer: [employer]  Social Needs  . Financial resource strain: Not hard at all  . Food insecurity:    Worry: Never true    Inability: Never true  . Transportation needs:    Medical: No    Non-medical: No  Tobacco Use  . Smoking status: Former Smoker    Packs/day: 1.00    Years: 15.00    Pack years: 15.00    Types: Cigarettes, E-cigarettes    Last attempt to quit: 03/18/2004    Years since quitting: 14.3  . Smokeless tobacco: Never Used  . Tobacco comment: States she vaps several times a weeks but no nicotine in the ones she uses  Substance and Sexual Activity  . Alcohol use: Never    Frequency: Never  . Drug use: No  . Sexual activity: Not Currently  Lifestyle  . Physical activity:    Days per week: 3 days    Minutes per session: 30 min  . Stress: To some extent  Relationships  . Social connections:    Talks on phone: More than three times a week    Gets together: More than three times a week    Attends religious service: Never     Active member of club or organization: No    Attends meetings of clubs or organizations: Never    Relationship status: Divorced  Other Topics Concern  . Not on file  Social History Narrative   Diet: Regular   Caffeine: Yes   Married- divorced, married in 2006   House: Yes, 2 persons   Pets: 1 dog   Current/Past profession: [employer] 2002-2013   Exercise: Yes, walking   Living Will: No    DNR: No    POA/HPOA: No        Additional Social History:   Allergies:  No Known Allergies  Metabolic Disorder Labs: Lab Results  Component Value Date   HGBA1C 5.6 06/21/2018   MPG 114.02 06/21/2018   MPG 114 06/13/2017   No results found for: PROLACTIN Lab Results  Component Value Date   CHOL 258 (H) 06/21/2018   TRIG 185 (  H) 06/21/2018   HDL 50 06/21/2018   CHOLHDL 5.2 06/21/2018   VLDL 37 06/21/2018   LDLCALC 171 (H) 06/21/2018   LDLCALC 172 (H) 03/13/2018   Lab Results  Component Value Date   TSH 3.264 06/21/2018    Therapeutic Level Labs: No results found for: LITHIUM No results found for: CBMZ No results found for: VALPROATE  Current Medications: Current Outpatient Medications  Medication Sig Dispense Refill  . ALPRAZolam (XANAX XR) 2 MG 24 hr tablet Take 1 tablet (2 mg total) by mouth daily. For anxiety 5 tablet 0  . ALPRAZolam (XANAX) 0.5 MG tablet Take 1 tablet (0.5 mg total) by mouth 2 (two) times daily as needed for anxiety. 10 tablet 0  . aspirin EC 81 MG tablet Take 81 mg by mouth daily.    . cholecalciferol (VITAMIN D3) 25 MCG (1000 UT) tablet Take 1 tablet (1,000 Units total) by mouth daily. For bone health 5 tablet 0  . colestipol (COLESTID) 1 g tablet Take 2 tablets (2 g total) by mouth 2 (two) times daily. For high cholesterol 10 tablet 0  . dicyclomine (BENTYL) 20 MG tablet Take 1 tablet (20 mg total) by mouth 4 (four) times daily -  before meals and at bedtime. For bowel spasms 15 tablet 0  . esomeprazole (NEXIUM) 40 MG capsule Take 1 capsule (40 mg  total) by mouth daily. For acid reflux 5 capsule 0  . FLUoxetine (PROZAC) 40 MG capsule Take 2 capsules (80 mg total) by mouth daily. For depression 60 capsule 0  . metoprolol tartrate (LOPRESSOR) 25 MG tablet Take 1 tablet (25 mg total) by mouth 2 (two) times daily. For hypertension 5 tablet 0  . rosuvastatin (CRESTOR) 20 MG tablet Take one tablet by mouth once daily: For high cholesterol 5 tablet 0  . SYNTHROID 125 MCG tablet TAKE 1 TABLET(125 MCG) BY MOUTH DAILY BEFORE BREAKFAST: For thyroid hormone replacement 5 tablet 0  . traZODone (DESYREL) 100 MG tablet Take 2 tablets (200 mg total) by mouth at bedtime as needed for sleep. 30 tablet 0   No current facility-administered medications for this visit.     Musculoskeletal: Strength & Muscle Tone: within normal limits Gait & Station: normal Patient leans: N/A  Psychiatric Specialty Exam: Review of Systems  Psychiatric/Behavioral: Positive for depression and suicidal ideas (passive and chroinc ). Negative for hallucinations and substance abuse. The patient is nervous/anxious.     Last menstrual period 03/19/2011.There is no height or weight on file to calculate BMI.  General Appearance: Casual  Eye Contact:  Fair  Speech:  Clear and Coherent  Volume:  Normal  Mood:  Anxious and Depressed  Affect:  Congruent  Thought Process:  Coherent  Orientation:  NA  Thought Content:  Logical  Suicidal Thoughts:  Yes.  with intent/plan  Homicidal Thoughts:  No  Memory:  NA  Judgement:  Fair  Insight:  Fair  Psychomotor Activity:  Normal  Concentration:  Concentration: Fair  Recall:  [employer] of Knowledge:Fair  Language: Fair  Akathisia:  No  Handed:  Right  AIMS (if indicated):   Assets:  Communication Skills Desire for Improvement Resilience  ADL's:  Intact  Cognition: WNL  Sleep:  Fair   Screenings: AIMS     Admission (Discharged) from OP Visit from 06/20/2018 in [facility]  AIMS Total Score   0    AUDIT     Admission (Discharged) from OP Visit from 06/20/2018 in [facility]  Alcohol Use Disorder Identification Test Final Score (AUDIT)  0    GAD-7     Counselor from 07/17/2018 in [facility]  Total GAD-7 Score  16    PHQ2-9     Counselor from 07/06/2018 in [facility] Most recent reading at 07/06/2018  1:39 PM Counselor from 07/17/2018 in [facility] Most recent reading at 07/06/2018  9:00 AM Abstract from confidential encounter on 09/07/2014 Most recent reading at 09/07/2014  8:52 AM  PHQ-2 Total Score  6  4  0  PHQ-9 Total Score  25  18  -      Assessment and Plan:  Admitted to IOP  Continue with current medication EMDR and or trauma based therapy  Treatment plan was reviewed and agreed upon by NP T. Bobby Rumpf and patient Siera Beyersdorf need for continued group services   Derrill Center, NP 3/10/20209:43 AM

## 2018-07-28 ENCOUNTER — Other Ambulatory Visit (HOSPITAL_COMMUNITY): Payer: Medicare HMO | Admitting: Licensed Clinical Social Worker

## 2018-07-28 ENCOUNTER — Other Ambulatory Visit (HOSPITAL_COMMUNITY): Payer: Self-pay

## 2018-07-28 ENCOUNTER — Encounter (HOSPITAL_COMMUNITY): Payer: Self-pay | Admitting: Psychiatry

## 2018-07-28 DIAGNOSIS — F332 Major depressive disorder, recurrent severe without psychotic features: Secondary | ICD-10-CM | POA: Diagnosis not present

## 2018-07-28 NOTE — Psych (Signed)
[facility] BH PHP THERAPIST PROGRESS NOTE  Andrea [name] [id number]  Session Time: 9:00 - 10:15   Participation Level: Active  Behavioral Response: CasualAlertDepressed  Type of Therapy: Group Therapy  Treatment Goals addressed: Coping  Interventions: CBT, DBT, Solution Focused, Supportive and Reframing  Summary: Andrea [name] is a 43 y.o. female who presents with depression, trauma, and grief symptoms. Patient arrived within time allowed and reports that she is feeling fine. Patient rates her mood at a 4 on a scale of 1-10 with 10 being great. Pt. reports "dad and I had a difficult evening yesterday" because their washer broke, and they had to use the laundromat which upset her father. Pt. states father was cursing and was mad which upset her. Pt. states she spoke to deceased son and it calmed her down. Pt. states, "I'm too old to depend on my daddy for everything" and wants to be more independent but believes her father won't let her. Pt. states older son is "pushing everyone away", and her father wants her to intervene, but she "told dad to let go and leave him alone." Patient doesn't want to upset son by pushing him to talk. Patient continues to struggle with reaching out to other supports. Patient engaged in discussion.        Session Time: 10:15 -11:00   Participation Level: Active   Behavioral Response: CasualAlertDepressed   Type of Therapy: Group Therapy, psychoeducation, psychotherapy   Treatment Goals addressed: Coping   Interventions: CBT, DBT, Solution Focused, Supportive and Reframing   Summary: Clinician led discussion on managing stressors during difficult and anxious situations. Patients discussed areas where they feel they could incorporate distraction skills to manage stress and anxiety.     Therapist Response: Patient engaged and participated in discussion. Pt. states she will work to identify her triggers so she can recognize and stay away from  them.        Session Time: 11:00 -12:00  Participation Level:Active  Behavioral Response:CasualAlertDepressed  Type of Therapy: Group Therapy, OT  Treatment Goals addressed: Coping  Interventions:Psychosocial skills training, Supportive,   Summary:Occupational Therapy group  Therapist Response: Patient engaged in group. See OT note.        Session Time: 12:00 - 1:00  Participation Level: Active  Behavioral Response: CasualAlertDepressed  Type of Therapy: Group Therapy, Psychoeducation, psychotherapy  Treatment Goals addressed: Coping  Interventions: CBT, DBT, solution focused, Supportive; Reframing  Summary: 12:00 - 12:50: Clinician continued the topic of cognitive distortions. Group watched Clare Gandy Talk illustrating ways to reframe and shift perspective.   12:50 -1:00 Clinician led check-out. Clinician assessed for immediate needs, medication compliance and efficacy, and safety concerns    Therapist Response: Patient engaged in discussion.  At [place], patient rates her mood at a 4 on a scale of 1-10 with 10 being great. Patient reports weekend plans to have a friend come over for a visit, walk with her dog and hang out at home. Patient demonstrates some progress as evidenced by desire to increase independence. Patient denies SI/HI/self-harm at the end of group.      Suicidal/Homicidal: Nowithout intent/plan  Plan: Pt will continue in PHP while working to decrease depression, trauma, and grief symptoms and increase ability to self manage all symptoms in a healthy manner as they arise.    Diagnosis: MDD (major depressive disorder), recurrent severe, without psychosis ([place]) [F33.2]    1. MDD (major depressive disorder), recurrent severe, without psychosis ([place])       Lorin Glass, LCSW  07/28/2018 

## 2018-07-29 ENCOUNTER — Other Ambulatory Visit (HOSPITAL_COMMUNITY): Payer: Self-pay

## 2018-07-29 ENCOUNTER — Other Ambulatory Visit: Payer: Self-pay

## 2018-07-29 ENCOUNTER — Other Ambulatory Visit (HOSPITAL_COMMUNITY): Payer: Medicare HMO | Admitting: Licensed Clinical Social Worker

## 2018-07-29 DIAGNOSIS — F332 Major depressive disorder, recurrent severe without psychotic features: Secondary | ICD-10-CM

## 2018-07-29 NOTE — Psych (Signed)
[facility] PHP THERAPIST PROGRESS NOTE  Andrea [name] [id number]  Session Time: 9:00 - 11:00   Participation Level: Active  Behavioral Response: CasualAlertDepressed  Type of Therapy: Group Therapy  Treatment Goals addressed: Coping  Interventions: CBT, DBT, Solution Focused, Supportive and Reframing  Summary: Andrea [name] is a 43 y.o. female who presents with depression, trauma, and grief symptoms. Patient arrived within time allowed and reports that "I feel like I can't share today". Patient refused to give number for mood scale. Pt. reports passive SI over the evening and managed those thoughts by "taling" to her deceased son. Pt. states she is feeling "triggered" because her older son is planning a Hotel manager and wants her to find and organize pictures of deceased son for the memorial. Pt. states inability to look at pictures of her son that passed and feels she "cannot be around people right now" so doesn't want to attend the memorial either. Pt. states her older son was upset about her reluctance to help plan the memorial and told her to "be a mom". Pt. states that comment"triggered" her and she has remained upset since. Pt. states a friend visited on Friday and the visit "was kind of strange". Pt. reports being suspicious that friend was coming over on pt's former therapist's behalf "to find things out." Pt is unable to say why she suspects this and changes topics when cln attempts to reframe. Pt. states that mother is "triggering" her because "she is being fake" in her actions towards pt. Pt is reluctant to reframe the situation and remains angry.  Patient continues to struggle with willingness to reframe or be skillfull to manage her emotions. Patient engaged in discussion.        Session Time: 11:00 -12:00  Participation Level:Active  Behavioral Response:CasualAlertDepressed  Type of Therapy: Group Therapy, OT  Treatment Goals addressed:  Coping  Interventions:Psychosocial skills training, Supportive,   Summary:Occupational Therapy group  Therapist Response: Patient engaged in group. See OT note.        Session Time: 12:00 - 12:45  Participation Level: Active  Behavioral Response: CasualAlertDepressed  Type of Therapy: Group Therapy  Treatment Goals addressed: Coping  Interventions: Systems analyst, Supportive  Summary:  Reflection Group: Patients encouraged to practice skills and interpersonal techniques or work on mindfulness and relaxation techniques. The importance of self-care and making skills part of a routine to increase usage were stressed   Therapist Response: Patient engaged and participated appropriately.         Session Time: 12:45 - 2:00  Participation Level: Minimal  Behavioral Response: CasualAlertDepressed  Type of Therapy: Group Therapy, Psychoeducation, psychotherapy  Treatment Goals addressed: Coping  Interventions: CBT, DBT, solution focused, Supportive; Reframing  Summary: 12:45 - 1:50: Cln introduced topic of boundaries. Cln provided psychoeducation on what boundaries are, porous, rigid and healthy boundary characteristics, and the different types of boundaries. Group related the information to themselves to begin discovering potential boundary issues.  1:50 -2:00 Clinician led check-out. Clinician assessed for immediate needs, medication compliance and efficacy, and safety concerns   Therapist Response: Patient engaged in group. Pt reports understanding of boundaries and shares she has mainly rigid boundaries. At [place], patient refused to give number for mood scale. Patient reports afternoon plans to walk dog. Patient demonstrates limited progress as evidenced by continued resistance to utilize skills to manage mood, even when help is offered in group. Pt is unwilling to say with certainty if she denies SI/HI. Cln followed  up.  After  group, cln W. Phoebe Sharps and intern T. Maxey followed up with Pt. to further assess for safety. Pt. initially makes vague comments such as "I have the means but I'm not going to kill myself" and will not elaborate. Cln informs pt that it is important she understand fully where pt is mentally and whether she has intentions to hurt herself or not. Pt continues to be vague with similar comments as before such as "I know I would never do that, I just know what I have on hand and what I could use." Cln asked pt to state yes or no to the question, do you have any current intention to harm yourself. Pt states "No." Pt quickly became tearful and shares she "just can't understand why [deceased son] would never tell me how he felt." Cln provided support and suggested they review pt's safety plan that was created at beginning of group. Pt. states she can listen to music, walk, play with the dog, and reach out to her father. Cln reminded pt that should she feel worse or like she might harm herself to tell pt's father and go to the ED. Pt. stated that she understood and agrees to tell her father if she is going to harm herself and then go to the ED. Cln suggested patient copy her safety plan and keep it somewhere easily seen as a reminder, so it is not just in a bag or desk somewhere. Pt. states that she will do that.       Suicidal/Homicidal: Nowithout intent/plan  Plan: Pt will continue in PHP while working to decrease depression, trauma, and grief symptoms and increase ability to self manage all symptoms in a healthy manner as they arise.    Diagnosis: MDD (major depressive disorder), recurrent severe, without psychosis ([place]) [F33.2]    1. MDD (major depressive disorder), recurrent severe, without psychosis ([place])   2. Post traumatic stress disorder (PTSD)       Lorin Glass, LCSW 07/29/2018

## 2018-07-29 NOTE — Progress Notes (Signed)
    Daily Group Progress Note  Program: IOP  Group Time: 9am-12pm  Participation Level: Active  Behavioral Response: Appropriate  Type of Therapy:  Group Therapy; process group, psycho-educational group  Summary of Progress:  The purpose of this group is to utilize CBT and DBT skills in a group setting to increase use of healthy coping skills and decrease frequency and intensity of active mental health symtpoms.  9am-10:30am Clinician presented Happy Brain: How to Overcome Our Neural Predispositions to Suffering by Dr. Lauris Poag.  Clinician presented psycho-educational information on automatic thinking and neural predispositions. Group members discussed negative adaptations including mind wandering, negativity bias. Educational information also included skills to improve moments of happiness by focusing on gratitude skills, non-judgmental stance, and reframing challenges to focus on compassion, acceptance, meaning, and forgiveness. Clinician and group members discussed recent life stressors and how these skills could be applied.  10:30am-12pm Clinician presented the topic of mindfulness in conjunction with psycho-educational materials focused vs wandering modes of thinking. Clinician and group members discussed core features of mindfulness and barriers for implementing. Clinician and group members practiced skill in session by participating in [place] meditation, with a focus on bringing the wandering mind back to the task in progress. Clinician and group members practiced being non-judgmental and participating fully by engaging in 'loaded questions' activity and showing moments of vulnerability which can be used as connection with others. Client engaged in discussion and activities. Client notes she found herself mind wandering during the video and meditation and chose not to bring herself back into the moment. Client reports the meditation was not helpful due to being triggering, as her son who  recently passed away, used to collect rocks for her.   Olegario Messier, LCSW

## 2018-07-29 NOTE — Progress Notes (Signed)
    Daily Group Progress Note  Program: IOP  Group Time: 9am-12pm  Participation Level: Minimal  Behavioral Response: Appropriate and Passive-Aggressive  Type of Therapy:  Group Therapy; process group, psycho-educational group  Summary of Progress:  The purpose of this group is to utilize CBT and DBT skills in a group setting to increase use of healthy coping skills and decrease frequency and intensity of active mental health symptoms.  9am-10:30am Clinician checked in with group members, assessing for SI/HI/psychosis and overall level of functioning. Clinician provided check in questions related to todays topic of healthy communication. Clinician and group members processed responses related to finding purpose, roles of family, language and culture. Clinician actively listened to group members, validating feelings utilizing reflective and summarizing statements. Clinician and group members discussed 4 types of communication styles and what each might look like. Clinician and group members discussed the role of non-verbal communication in interactions, and the possible benefits or drawbacks based on face to face or digital interactions.  10:30am-12pm Clinician and group members reviewed communication roadblocks. Clinician and group members discussed utilizing I-statements and questions to ensure both parties have the same information, active listening, and setting boundaries by clearly saying 'no.' Clinician provided psycho-educational information on DBT Communication skills DEAR MAN, GIVE, and FAST. Clinician and group members role played scenarios for each skill. Clinician praised group members engagement and inquired about self care activity for the day.  Client answered question about the importance of being defined by culture or not. Client reports trying not to be judgmental. Client was minimally engaged but was able to demonstrate assertive communication by requesting to be called by her  name rather than ma'am by group clinician.   Olegario Messier, LCSW

## 2018-07-29 NOTE — Psych (Signed)
[facility] PHP THERAPIST PROGRESS NOTE  Andrea [name] [id number]  Session Time: 9:00 - 11:00   Participation Level: Active  Behavioral Response: CasualAlertDepressed  Type of Therapy: Group Therapy  Treatment Goals addressed: Coping  Interventions: CBT, DBT, Solution Focused, Supportive and Reframing  Summary: Clinician led check-in regarding current stressors and situation, and review of patient completed daily inventory. Clinician utilized active listening and empathetic response and validated patient emotions. Clinician facilitated processing group on pertinent issues.    Therapist Response: Andrea [name] is a 43 y.o. female who presents with depression, trauma, and grief symptoms. Patient arrived within time allowed and reports that she feels "fine." Patient rates her mood at a 2 on a scale of 1-10 with 10 being great. Pt. reports "I'm here." Pt. states she turned her phone off yesterday and spent time "talking" to her deceased son. Pt. states she does not remember anything else from the night. Pt reports "I don't remember fixing dinner" and "my daddy told me that I ate, but I don't remember any of it." Pt. states she also does not remember going to bed last night. Pt reports she intended to wake up and get out of bed on her own this morning, but didn't get out of bed until her father made her get up. Cln suggested pt utilize grounding skills when she recognizes she is not remembering things to help bring her back to the present moment. Pt states "nothing works. It won't help." Patient continues to struggle with resistance and emotion dependent behaviors. Patient engaged in discussion.       Session Time: 11:00 -12:00  Participation Level:Active  Behavioral Response:CasualAlertDepressed  Type of Therapy: Group Therapy, OT  Treatment Goals addressed: Coping  Interventions:Psychosocial skills training, Supportive,   Summary:Occupational Therapy  group  Therapist Response: Patient engaged in group. See OT note.        Session Time: 12:00 - 12:45  Participation Level: Active  Behavioral Response: CasualAlertDepressed  Type of Therapy: Group Therapy  Treatment Goals addressed: Coping  Interventions: Systems analyst, Supportive  Summary:  Reflection Group: Patients encouraged to practice skills and interpersonal techniques or work on mindfulness and relaxation techniques. The importance of self-care and making skills part of a routine to increase usage were stressed   Therapist Response: Patient engaged and participated appropriately.         Session Time: 12:45 - 2:00  Participation Level: Minimal  Behavioral Response: CasualAlertDepressed  Type of Therapy: Group Therapy, Psychoeducation, psychotherapy  Treatment Goals addressed: Coping  Interventions: CBT, DBT, solution focused, Supportive; Reframing  Summary: 12:45 - 1:50: Clinician provided education on how to set boundaries. Cln utilized handout "How to set a Boundary" and "Healthy Boundary setting." Pt's shared boundary issues and group discussed how to handle them.   1:50 -2:00 Clinician led check-out. Clinician assessed for immediate needs, medication compliance and efficacy, and safety concerns   Therapist Response: Patient engaged in group. Pt reports understanding of how to set a boundary and discusses a boundary she wants to set with her mom. At [place], Patient rates her mood at a 5 on a scale of 1-10 with 10 being great. Patient reports afternoon plans to be with her dog and unpack more. Patient demonstrates some progress as evidenced by improved mood throughout group. Patient denies SI/HI/self-harm at the end of group.       Suicidal/Homicidal: Nowithout intent/plan  Plan: Pt will continue in PHP while working to decrease depression, trauma, and grief symptoms and  increase ability to self manage all  symptoms in a healthy manner as they arise.    Diagnosis: MDD (major depressive disorder), recurrent severe, without psychosis ([place]) [F33.2]    1. MDD (major depressive disorder), recurrent severe, without psychosis ([place])   2. Post traumatic stress disorder (PTSD)       Lorin Glass, LCSW 07/29/2018

## 2018-07-30 ENCOUNTER — Other Ambulatory Visit: Payer: Self-pay

## 2018-07-30 ENCOUNTER — Other Ambulatory Visit (HOSPITAL_COMMUNITY): Payer: Medicare HMO | Admitting: Licensed Clinical Social Worker

## 2018-07-30 DIAGNOSIS — F332 Major depressive disorder, recurrent severe without psychotic features: Secondary | ICD-10-CM | POA: Diagnosis not present

## 2018-07-30 NOTE — Progress Notes (Signed)
    Daily Group Progress Note  Program: IOP  Group Time: 9am-12pm  Participation Level: Active  Behavioral Response: Sharing and Attention-Seeking  Type of Therapy:  Group Therapy; psycho-educational group, process group  Summary of Progress:  The purpose of this group is to utilize CBT and DBT skills in a group setting to increase use of healthy coping skills and decrease frequency and intensity of active mental health symptoms.  9am-10:30am Clinician checked in with group members, assessing for SI/HI/psychosis and overall level of functioning. Clinician inquired about completed self care activities from previous day. Clinician presented the topic of Mind Traps. Clinician reviewed with clients the connection between thoughts, feelings, and behaviors. Clinician emphasized that mind traps are learned ways of reacting to events in life, much like other patterns or habits and it is possible for [place] to learn new ways of thinking that help [place] avoid many of the emotional upheavals caused by our own mind traps. Clinician and group members reviewed each mind trap, provided examples from daily life, and practiced challenging each trap. Clinician and group members processed the impact of mind traps on mental health recovery. Clinician presented Progressive Muscle Relaxation activity for group members to participate. Clinician and group members compared this with yoga and the pebble meditation.  10:30am-12pm Clinician presented Roadblocks to Healthy Thinking that can interfere with change and contribute to relapse. Clinician and group members processed what Roadblocks have in common, why we use these, and the impact these have on relationships and mental health recovery. Clinician and group members utilized personal examples of how to address each type of roadblock and practice developing new thinking habits. Clinician validated client feelings that changing thought processing can be frustrating and is a long  term solution with repetitive practice. Clinician and group members reviewed Thinking and Behavior Cycle and focused on ways to break cycle of permission statements and unhelpful behaviors. Clinician checked out with group members planned self care activity for the day.  Client presented to group reporting she was angry. Client was somewhat able to identify the source of her anger and was able to share the responses and consequences. Client required some redirection to stay on relevant topics. Client was able to identify where she saw roadblocks to healthy thinking in others. Client did attend bible study the previous day as her self care, which she identifies she has not done since her son passed away, showing progress toward treatment goals.  Olegario Messier, LCSW

## 2018-07-30 NOTE — Progress Notes (Signed)
    Daily Group Progress Note  Program: IOP  Group Time: 9am-12pm  Participation Level: Active  Behavioral Response: Appropriate  Type of Therapy:  Group Therapy; psycho-educational group, process group  Summary of Progress:  The purpose of this group is to utilize CBT and DBT skills in a group setting to increase use of healthy coping skills and decrease frequency and intensity of active mental health symptoms.  9am-10:30am Clinician checked in with group members, assessing for SI/HI/psychosis and overall level of functioning. Clinician inquired about completed self-care activity from the previous day. Psycho-educational portion of group led by pharmacist. Pharmacist provided psychoeducation on classes of medications and symptoms addressed. Time was allowed for clients to ask questions of pharmacist.  10:30am-12pm Clinician presented the topic of Cristela Blue Mind with a task to improve focus. Clinician and group members discussed Emotional, Rational, and Wise mind, and the effect of decision making based on each. Clinician and group members discussed where each could be useful or harmful. Clinician presented the topic of maintaining focus, expanding on the content of what the group tended to think about at times of focus loss. Clinician reminded clients of CBT triangle and connection of thoughts, feelings, and behaviors. Clinician validated the universal tendency of individuals to mind wander and emphasized the importance of assessing their thoughts for thought traps and the amount of work necessary to reverse the effects of negative thoughts. Clinician presented strategies to maintain focus and facilitated discussion with clients about their successes and personal applicability. Clinician checked out requesting clients share a self-care activity planned for the day. Client has made progress in therapy as evidenced by her ability to disclose her coping strategies after her loss and her admission of  requiring assistance for ADLs. Client spoke of the future despite her passive suicidal disclosure. Client reported use of appropriate self care. Client described her use of the rational mind during important tasks and her preference of the emotional mind when experiencing grief. Client continues to struggle with depressive symptoms.    Olegario Messier, LCSW

## 2018-07-31 ENCOUNTER — Ambulatory Visit: Payer: Medicare HMO | Admitting: Nurse Practitioner

## 2018-07-31 ENCOUNTER — Other Ambulatory Visit (HOSPITAL_COMMUNITY): Payer: Medicare HMO | Admitting: Licensed Clinical Social Worker

## 2018-07-31 ENCOUNTER — Other Ambulatory Visit: Payer: Self-pay

## 2018-07-31 DIAGNOSIS — F332 Major depressive disorder, recurrent severe without psychotic features: Secondary | ICD-10-CM

## 2018-08-03 ENCOUNTER — Ambulatory Visit (HOSPITAL_COMMUNITY): Payer: Medicare HMO | Admitting: Psychiatry

## 2018-08-03 ENCOUNTER — Other Ambulatory Visit: Payer: Self-pay

## 2018-08-03 ENCOUNTER — Other Ambulatory Visit (HOSPITAL_COMMUNITY): Payer: Medicare HMO | Admitting: Licensed Clinical Social Worker

## 2018-08-03 DIAGNOSIS — F332 Major depressive disorder, recurrent severe without psychotic features: Secondary | ICD-10-CM | POA: Diagnosis not present

## 2018-08-03 NOTE — Progress Notes (Signed)
    Daily Group Progress Note  Program: IOP  Group Time: 9am-12pm  Participation Level: Active  Behavioral Response: Appropriate  Type of Therapy:  Group Therapy; process group, psycho-educational group  Summary of Progress:  The purpose of this group is to utilize CBT and DBT skills in a group setting to increase use of healthy coping skills and decrease frequency and intensity of active mental health symptoms.  9am-10:30am Clinician checked in with group members, assessing for SI/HI/psychosis. Clinician and group members discussed stressors and skills from the evening. Clinician actively listened to clients, utilizing summarizing and re-framing statements. Clinician and group members processed difficulties asking for help based on cultural background as well as existing stigmas. Clinician and group members processed options for finding understanding support systems.  10:30am-12pm Clinician presented Laughter Yoga to clients with psycho-education as well as in session activities. Clinician praised clients willingness to use mindfulness and engage in alternative forms of mindfulness. Clinician and group members discussed different intensities of feelings and vulnerability. Clinician and group members discussed positive affirmations and their level of belief based on previous experiences. Clinician concluded group with asking client to identify positive self care activity planned for the week. Client engaged in discussions, requiring frequent re-direction to stop discussing previous suicide attempts and desires to be dead. Client report this is a hard day for her related to her son and his death. Client returns to this subject and feelings about his death and her desire to be with him multiple times throughout group with visible distress and tearfulness when talking about him. Client minimally engaged in mindfulness activity, reporting she was not in the mood to laugh this day.  Olegario Messier,  LCSW

## 2018-08-04 ENCOUNTER — Other Ambulatory Visit (HOSPITAL_COMMUNITY): Payer: Medicare HMO | Admitting: Licensed Clinical Social Worker

## 2018-08-04 ENCOUNTER — Other Ambulatory Visit: Payer: Self-pay

## 2018-08-04 VITALS — BP 178/78

## 2018-08-04 DIAGNOSIS — F332 Major depressive disorder, recurrent severe without psychotic features: Secondary | ICD-10-CM

## 2018-08-04 NOTE — Progress Notes (Signed)
    Daily Group Progress Note  Program: IOP  Group Time: 9am-12pm  Participation Level: Active  Behavioral Response: Sharing and Attention-Seeking  Type of Therapy:  Group Therapy; process group, psycho-educational group  Summary of Progress:  The purpose of this group is to utilize CBT and DBT skills in a group setting to increase use of healthy coping skills and decrease frequency and intensity of active mental health symptoms. 9am-10:30am Clinician checked in with group members, assessing for SI/HI/psychosis. Clinician and group members discussed stressors and skills from the weekend. Clinician actively listened to clients, utilizing summarizing and re-framing statements. Clinician and group members discussed motivation, and ways to improve motivation to get out of bed, even with limited desire to complete tasks. Clinician and group members processed the vulnerability that comes with asking for help, especially when it is a new skill. Group members processed reasons for seeking and accepting help at this time. Clinician lead mindful stretching exercise.  10:30am-12pm Clinician presented the topic of Growth Mindset. Clinician and group members discussed adapting core beliefs and focusing on avoiding black and white thinking. Clinician praised group members for identifying cognitive distortions they are actively working on addressing. Clinician and group members discussed the struggle being receptive to help after avoiding and minimizing symptoms. Clinician presented clients with positive affirmations, and requested that group members share an affirmation they believe to be true related to themselves. Client required frequent re-direction to not share unnecessary details about previous plans to kill herself. Client remains hyper-focused on grief related to loss of son and struggles to engage in discussion topics without relating it to her son. Client reports not knowing how to cope with this level  of grief and thoughts and feelings of guilt thinking about the days leading up to her sons' death, noting '37 I think the day should have gone differently and I should have gone with him." Client was somewhat receptive to re-direction however continued to go back to the topic of being with her son, though was adamant she did not want to kill herself, which she repeated multiple times.  Olegario Messier, LCSW

## 2018-08-05 ENCOUNTER — Other Ambulatory Visit: Payer: Self-pay

## 2018-08-05 ENCOUNTER — Other Ambulatory Visit (HOSPITAL_COMMUNITY): Payer: Medicare HMO | Admitting: Licensed Clinical Social Worker

## 2018-08-05 DIAGNOSIS — F332 Major depressive disorder, recurrent severe without psychotic features: Secondary | ICD-10-CM

## 2018-08-05 NOTE — Progress Notes (Signed)
    Daily Group Progress Note  Program: IOP  Group Time: 9am-12pm  Participation Level: Active  Behavioral Response: Attention-Seeking  Type of Therapy: Group Therapy; process group, psycho-educational group ? Summary of Progress:  The purpose of this group is to utilize CBT and DBT skills to increase use of healthy coping skills and decrease frequency and intensity of active mental health symptoms.  9am-10:30am Clinician checked in with group members, assessing for SI/HI/psychosis and overall level of functioning. Clinician inquired about completed self care activities and any skills practiced since last group. Clinician presented the topic of Healthy Boundaries. Clinician and group members discussed what boundaries means and common types of boundaries. Clinician and group members processed factors in setting and maintaining boundaries including relationship, situation, and previous response to boundaries. Clinician presented to group My Personal Bill of Rights and processed difficulty changing mindset to allow for healthy personal boundaries.  10:30am-12pm Clinician and clients discussed types of boundaries, and signs of healthy and unhealthy boundaries. Clinician and group members discussed how to define and maintain healthy boundaries with self, others and food, substances, or time. Clinician and group members processed struggle with codependency and feelings of low self worth and the effect on identifying and maintaining healthy boundaries. Clinician and group members role played in session identifying specific feelings related to a situation, the thoughts causing those feelings, and identifying alternative thoughts without previously learned cognitive distortions. Clinician requested group members check out with something they are grateful for and a planned self care activity for the day. Client reports poor boundaries with mother and acknowledges the possibility of co-dependency with her son  who has passed away.  Olegario Messier, LCSW

## 2018-08-05 NOTE — Psych (Signed)
[facility] PHP THERAPIST PROGRESS NOTE  Andrea [name] [id number]  Session Time: 9:00 - 11:00   Participation Level: Active  Behavioral Response: CasualAlertDepressed  Type of Therapy: Group Therapy  Treatment Goals addressed: Coping  Interventions: CBT, DBT, Solution Focused, Supportive and Reframing  Summary: Clinician led check-in regarding current stressors and situation, and review of patient completed daily inventory. Clinician utilized active listening and empathetic response and validated patient emotions. Clinician facilitated processing group on pertinent issues.    Therapist Response: Andrea [name] is a 43 y.o. female who presents with depression, trauma, and grief symptoms. Patient arrived within time allowed and reports that she is "still losing time". Pt. reports she's never lost time before and "my daddy is reminding me what we're doing and what I ate." Patient rates her mood at a 4 on a scale of 1-10 with 10 being great. Pt states yesterday she tried to organize her movie collection and got angry and triggered because the movies belonged to her son. Pt states she did laundry with her father and ate what he made for dinner but doesn't remember doing these things and it's bothering her. Pt. states she had a dream about son when he was a baby and states "I keep forgetting that he's gone". It is unclear whether pt is losing time in the way she describes, as multiple times pt will make a detailed account, and when that is pointed out, she says her dad told her she did it. This seems unlikely as one time it was what she was thinking and another time when she stated she was alone in the house. Pt. states that she will attend son's memorial service this weekend because she feels like she has to go, but is worried that she will be triggered. Patient continues to struggle with willingness to avoid triggers. Patient engaged in discussion.         Session Time: 11:00  -12:00  Participation Level:Active  Behavioral Response:CasualAlertDepressed  Type of Therapy: Group Therapy, OT  Treatment Goals addressed: Coping  Interventions:Psychosocial skills training, Supportive,   Summary:Occupational Therapy group  Therapist Response: Patient engaged in group. See OT note.        Session Time: 12:00 - 12:45  Participation Level: Active  Behavioral Response: CasualAlertDepressed  Type of Therapy: Group Therapy  Treatment Goals addressed: Coping  Interventions: Systems analyst, Supportive  Summary:  Reflection Group: Patients encouraged to practice skills and interpersonal techniques or work on mindfulness and relaxation techniques. The importance of self-care and making skills part of a routine to increase usage were stressed   Therapist Response: Patient engaged and participated appropriately.         Session Time: 12:45 - 2:00  Participation Level: Minimal  Behavioral Response: CasualAlertDepressed  Type of Therapy: Group Therapy, Psychoeducation, psychotherapy  Treatment Goals addressed: Coping  Interventions: CBT, DBT, solution focused, Supportive; Reframing  Summary: 12:45 - 1:50: Clinician continued discussion on boundaries. Group participated in boundaries workshop activity in which they helped each other work through boundary issues.   1:50 -2:00 Clinician led check-out. Clinician assessed for immediate needs, medication compliance and efficacy, and safety concerns   Therapist Response: Patient minimally engaged in group. Pt provided some feedback to group members and did not share her own example. At [place], patient rates her mood at a 4 on a scale of 1-10 with 10 being great. Patient reports afternoon plans of playing with dog and waiting until dad gets home from work. Patient demonstrates  some progress as evidenced by acknowledging feelings of grief, however still  struggles to utilize management strategies for her moods. Patient denies SI/HI/self-harm thoughts at the end of group.      Suicidal/Homicidal: Nowithout intent/plan  Plan: Pt will continue in PHP while working to decrease depression, trauma, and grief symptoms and increase ability to self manage all symptoms in a healthy manner as they arise.    Diagnosis: MDD (major depressive disorder), recurrent severe, without psychosis ([place]) [F33.2]    1. MDD (major depressive disorder), recurrent severe, without psychosis ([place])   2. Post traumatic stress disorder (PTSD)       Andrea Glass, LCSW 08/05/2018

## 2018-08-05 NOTE — Psych (Signed)
[facility] PHP THERAPIST PROGRESS NOTE  REYLYNN VANALSTINE [id number]  Session Time: 9:00 - 11:00   Participation Level: Active  Behavioral Response: CasualAlertDepressed  Type of Therapy: Group Therapy  Treatment Goals addressed: Coping  Interventions: CBT, DBT, Solution Focused, Supportive and Reframing  Summary: Clinician led check-in regarding current stressors and situation, and review of patient completed daily inventory. Clinician utilized active listening and empathetic response and validated patient emotions. Clinician facilitated processing group on pertinent issues.   Therapist response:  ADILYNNE FITZWATER is a 43 y.o. female who presents with depression, trauma, and grief symptoms. Patient arrived within time allowed and reports that she is feeling "good." Patient rates her mood at a 5 on a scale of 1-10 with 10 being great. Pt. reports she doesn't remember yesterday however is able to report a number of things that occurred. Pt. states her friend called trying to set pt up with friend's son. Pt. states, "I told her that I was not in a place for a relationship" however still heard from friend's son later. Pt reports she used her boundary skills and told him "she was not dating". Pt. states she also canceled plans to meet her mom for coffee later in the week. Pt. states "I wish my mother wasn't acting so fake. I prefer her when she's acting like her old self, like a bitch." Pt. States she spent the remainder of her evening walking the dog, eating dinner, and watching TV. Patient engaged in discussion.         Session Time: 11:00 -12:15  Participation Level: Active  Behavioral Response: CasualAlertDepressed  Type of Therapy: Group Therapy, psychotherapy  Treatment Goals addressed: Coping  Interventions: Strengths based, reframing, Supportive,   Summary:  Spiritual Care group  Therapist Response: Patient engaged in group. See chaplain note.          Session Time: 12:15 - 1:00  Participation Level: Active  Behavioral Response: CasualAlertDepressed  Type of Therapy: Group Therapy  Treatment Goals addressed: Coping  Interventions: Systems analyst, Supportive  Summary:  Reflection Group: Patients encouraged to practice skills and interpersonal techniques or work on mindfulness and relaxation techniques. The importance of self-care and making skills part of a routine to increase usage were stressed   Therapist Response: Patient engaged and participated appropriately.         Session Time: 1:00- 2:00  Participation Level: Active  Behavioral Response: CasualAlertDepressed  Type of Therapy: Group Therapy, Psychoeducation  Treatment Goals addressed: Coping  Interventions: relaxation training; Supportive; Reframing  Summary: 12:45 - 1:50: Relaxation group: Cln led group focused on retraining the body's response to stress.   1:50 -2:00 Clinician led check-out. Clinician assessed for immediate needs, medication compliance and efficacy, and safety concerns   Therapist Response: Patient engaged in activity and discussion. At [place], patient rates her mood at a 5 on a scale of 1-10 with 10 being great. Patient reports afternoon plans to alphabetize movies and hang out with her dog. Patient demonstrates some progress as evidenced by practicing healthy communication skills. Patient denies SI/HI/self-harm thoughts at the end of group.     Suicidal/Homicidal: Nowithout intent/plan  Plan: Pt will continue in PHP while working to decrease depression, trauma, and grief symptoms and increase ability to self manage all symptoms in a healthy manner as they arise.    Diagnosis: MDD (major depressive disorder), recurrent severe, without psychosis ([place]) [F33.2]    1. MDD (major depressive disorder), recurrent severe, without psychosis ([place])   2.  Post traumatic stress disorder (PTSD)        Lorin Glass, LCSW 08/05/2018

## 2018-08-05 NOTE — Progress Notes (Signed)
    Daily Group Progress Note  Program: IOP  Group Time: 9am-12pm  Participation Level: Active  Behavioral Response: Appropriate  Type of Therapy: Group Therapy; process group, psycho-educational group Summary of Progress: The purpose of this group is to utilize CBT and DBT skills in a group setting to increase use of healthy coping skills and decrease frequency and intensity of active mental health symptoms.  9am-11am: Clinician provided psycho-educational information related to mindfulness and relationship between emotions and eating. Clinician and group members brainstormed options for other coping skills to use in place of eating, and the benefits of mindful eating. Clinician presented the skill of EFT or Tapping, focused on acknowledging problems and emotions, and showing self compassion and acceptance. Clinician and group members processed how they learned to identify, share, and cope with emotions growing up. Clinician and group members processed how what was modeled when they were younger had an effect on relationships with people and emotions as adults.  11am-12pm Co-facilitator for psycho-educational group from [facility]. Guest provided psycho-educational information on health and wellness in relation to mental health. Topics included sleep hygiene, healthy eating habits, and benefits to exercise.  Client reports not finding tapping useful and did not attempt activity due to "OCD and not wanting to touch my face." Client shared how feelings were shown as a child and how she tried to change her parenting style to address what she feels was missed while she was growing up and provide more support to her children. Client shared that her first marriage she believes was a healthy relationship but due to not being used to "people caring about me" the relationship dissolved and she reports staying in a dysfunctional relationship longer due to it feeling 'more normal.'  Olegario Messier, LCSW

## 2018-08-06 ENCOUNTER — Other Ambulatory Visit (HOSPITAL_COMMUNITY): Payer: Medicare HMO | Admitting: Licensed Clinical Social Worker

## 2018-08-06 VITALS — BP 133/93

## 2018-08-06 DIAGNOSIS — F332 Major depressive disorder, recurrent severe without psychotic features: Secondary | ICD-10-CM

## 2018-08-06 NOTE — Progress Notes (Signed)
  [facility] Discharge Summary  Andrea [name] [id number]  Admission date: 07/18/2018 Discharge date: 08/07/2018  Reason for admission:  Depression with suicidal ideations  Per assessment note on admission to PHP-Telma is 43 year old Caucasian, divorced female who is recently discharged from behavioral health center after having suicidal thoughts and plan to shoot herself or taking overdose. Patient found her 63 year old son deceased from shooting/hanging by his own hand on January 30. Patient told it was shocking for her to see him to see his. She tried to CPR him but apparently he was already dead. She saw blood, brain out of his head and recall whole thing was very graphic. Her 66 year old son was autistic, seeing [name] for medication. Patient was doing home schooling on him. Patient do not recall anything prior to his suicide made him to do that. She told that he undergo her son came out gay and apparently her son's father did not like that  Chemical Use History: Denied  Family of Origin Issues: reported that her father has been a major support. States she continues to work to the relationship between she and her mother and oldest son. Patient to consider family counseling after Intensive outpatient programing.   Progress in Program Toward Treatment Goals: Patient attended and participated with daily group session   Progress (rationale): - weekly therapy sessions to be consider. EMDR. Keep follow up with    Take all medications as prescribed. Keep all follow-up appointments as scheduled.  Do not consume alcohol or use illegal drugs while on prescription medications. Report any adverse effects from your medications to your primary care provider promptly.  In the event of recurrent symptoms or worsening symptoms, call 911, a crisis hotline, or go to the nearest emergency department for evaluation.   Derrill Center,  NP 08/06/2018

## 2018-08-06 NOTE — Progress Notes (Signed)
    Daily Group Progress Note  Program: IOP  Group Time: 9am-12pm  Participation Level: Active  Behavioral Response: Appropriate  Type of Therapy: Group Therapy; process group, psycho educational skills group ? Summary of Progress:  The purpose of this group is to utilize CBT and DBT skills in a group setting to increase use of healthy coping skills and decrease frequency and intensity of active mental health symptoms.  9am-11am Clinician checked in with group members, assessing for SI/HI/psychosis and overall level of functioning. Clinician inquired about stressors from the previous day and skills clients attempted to address these. Clinician and group processed frustration with wellness presentation from the previous day. Clinician and group members utilized Ungame prompts to process ongoing stressors, current needs, and brainstorm ways to get needs met. Clinician provided supportive listening, reflective statements and clarifying questions to maintain engagement. Clinician highlighted skills clients were using outside of group setting and provided praise.  11a-12 pm Psycho-educational and skills group co-facilitated by counselor focusing on using yoga and breathing as mindfulness skill to help manage daily symptoms.  Client engaged in discussions and activities with some re-direction required. Client presented with brighter affect than previous days though reported struggling to figure out who she is if not her son's mother. Client identifies that even though she knows she needs her father's support right now, she finds it annoying.   Olegario Messier, LCSW

## 2018-08-06 NOTE — Progress Notes (Signed)
Andrea [name] is a 43 y.o. female patient, who arrived this morning stating that her father didn't have a chance to take her b/p.  A:  Pt's b/p was 133/93 (sitting).  Discussed with Dr. Montel Culver and he suggested that pt go ahead and take her Beta-Blocker at this time.  Provided pt with a cup of water so she could take the medicine.  R:  Pt receptive.        Carlis Abbott, RITA, M.Ed, CNA

## 2018-08-07 ENCOUNTER — Other Ambulatory Visit (HOSPITAL_COMMUNITY): Payer: Medicare HMO | Admitting: Licensed Clinical Social Worker

## 2018-08-07 ENCOUNTER — Other Ambulatory Visit: Payer: Self-pay

## 2018-08-07 DIAGNOSIS — F332 Major depressive disorder, recurrent severe without psychotic features: Secondary | ICD-10-CM

## 2018-08-07 DIAGNOSIS — R4589 Other symptoms and signs involving emotional state: Secondary | ICD-10-CM

## 2018-08-07 DIAGNOSIS — F4310 Post-traumatic stress disorder, unspecified: Secondary | ICD-10-CM

## 2018-08-07 NOTE — Patient Instructions (Signed)
D:  Patient successfully completed MH-IOP today.  A:  Discharge today.  Follow up with Dr. Toy Care on 11-23-18 @ 1:15 pm and Lise Auer, LCSW on 08-12-18 @ 9 a.m.  Encouraged support groups.  Strongly recommend pt to go to Hospice Appt on 08-17-18.  R:  Patient receptive.

## 2018-08-07 NOTE — Progress Notes (Signed)
Andrea [name] is a 43 y.o. , divorced, unemployed, Caucasian female who transitioned from [facility]  As most recent CCA states:  Pt presents to PHP per inpt. Pt was inpt due to SI after son killed himself last month. Pt found son and still dealing with the trauma. Pt reports "my son was my life. He told me everything and he went everywhere with me. I can't go to the same places now because it reminds me of him. I need a new grocery store, pharmacy, everything. I never went anywhere without him." Pt reports she wants to live and needs to gain coping skills to handle her symptoms. Pt shares she has "lifelong" SI and wants to live. Pt shares she is "shocked, numb, and angry" about son's death. Pt reports hx of MDD, Social Anxiety, GAD, and Agoraphobia. Pt has hx of therapy and psychiatry. Pt prefers new therapist; "my old therapist stepped over many boundaries while I was in the hospital and I want someone new." Pt wants to stay with Dr. Toy Care for psychiatry. Pt has hx of cutting self and 1 "almost attempt" at the age of 65; pt had pills ready to take when her cat walked into the room and the thought of "who will take care of [name]" stopped pt. Pt denies HI/AVH at this time; pt reports passive SI "but I want to live for the first time in a long time. I look at suicide differently now." Pt started in [place] 07-27-18.  Previous IOP note states:  Reports she feels as though she is still in a daze.  Her deceased son's [name] was this past Saturday.  According to pt, it was very beautiful. Pt continues to be going through the stages of grief.  "My father has to do everything for me."  Pt is looking forward to her first appt with Hospice Grief Group on 08-17-18. Admits to passive SI; denies a plan or intent.  States her deterrent is her father and other son.  Discussed safety options with pt.  Pt is able to contract for safety.   Pt completed MH-IOP today.  Denies SI/HI or A/V hallucinations.  Continues to grieve.   Reports decreased nightmares, but still is awakening.  Still having flashbacks.  States she is practicing setting boundaries with her father.  A:  D/C today.  Follow up with Dr. Toy Care on 11-23-18 @ 1:15 pm (per pt's choice b/c she is self pay) and Lise Auer, LCSW on 08-12-18 @ 9a.m.  Encouraged support groups; especially the upcoming Hospice appt (08-17-18).  Provide pt with Marja Kays (grief specialist card).  She doesn't accept insurance though.  R:  Patient receptive.         Carlis Abbott, RITA, M.Ed,CNA

## 2018-08-10 ENCOUNTER — Other Ambulatory Visit (HOSPITAL_COMMUNITY): Payer: Medicare HMO

## 2018-08-10 DIAGNOSIS — G894 Chronic pain syndrome: Secondary | ICD-10-CM | POA: Diagnosis not present

## 2018-08-10 DIAGNOSIS — M4726 Other spondylosis with radiculopathy, lumbar region: Secondary | ICD-10-CM | POA: Diagnosis not present

## 2018-08-10 DIAGNOSIS — M5416 Radiculopathy, lumbar region: Secondary | ICD-10-CM | POA: Diagnosis not present

## 2018-08-10 DIAGNOSIS — Z79891 Long term (current) use of opiate analgesic: Secondary | ICD-10-CM | POA: Diagnosis not present

## 2018-08-10 NOTE — Progress Notes (Signed)
    Daily Group Progress Note  Program: IOP  Group Time: 9am-12pm  Participation Level: Active  Behavioral Response: Appropriate, Sharing and Attention-Seeking  Type of Therapy: Group Therapy; process group, psycho-educational group ? Summary of Progress:  The purpose of this group is to utilize CBT and DBT skills in a group setting to increase use of healthy coping skills and decrease frequency and intensity of active mental health symptoms.  9am-10:30am Clinician checked in with group members, assessing for SI/HI/psychosis and overall level of functioning. Clinician inquired about any skills practiced the previous night and their level of success. Clinician reminded clients the importance of building mastery of skills by daily practice. Clinician and group members utilized process group to discuss relapse and relapse prevention for mental health and substance use. Clinician inquired about triggers for relapse, and signs clients know from previous episodes that things are not going well. Clinician and group members discussed My Relapse Checklist and Tips for Avoiding Relapse. Clinician and group members processed feelings related to relapse and barriers to maintaining progress.  10:30am-12pm Clinician presented DBT Distress Tolerance skills for TIPP and ACCEPTS to be used as distraction skills while coping with overwhelming emotions. Clinician provided clients with 101 Ways to Cope with Stress worksheet and encouraged clients to pick an activity every day that brings a moment of joy.  Client completes IOP on this day, see NP and Case management notes for discharge plans. Client reports she is working on  Figuring out why she is still alive and even though she has passive thoughts, would like to continue living. Client is able to identify some learned skills however has not consistently demonstrated ability to regulate emotions or address distorted thoughts without prompting and support. Client is  somewhat hesitant to engage in hospice group but identifies that it will likely be helpful.  Olegario Messier, LCSW

## 2018-08-11 ENCOUNTER — Other Ambulatory Visit (HOSPITAL_COMMUNITY): Payer: Medicare HMO

## 2018-08-12 ENCOUNTER — Other Ambulatory Visit: Payer: Self-pay

## 2018-08-12 ENCOUNTER — Ambulatory Visit (INDEPENDENT_AMBULATORY_CARE_PROVIDER_SITE_OTHER): Payer: Medicare HMO | Admitting: Psychiatry

## 2018-08-12 ENCOUNTER — Encounter (HOSPITAL_COMMUNITY): Payer: Self-pay | Admitting: Psychiatry

## 2018-08-12 ENCOUNTER — Other Ambulatory Visit (HOSPITAL_COMMUNITY): Payer: Medicare HMO

## 2018-08-12 DIAGNOSIS — F332 Major depressive disorder, recurrent severe without psychotic features: Secondary | ICD-10-CM

## 2018-08-12 DIAGNOSIS — F4310 Post-traumatic stress disorder, unspecified: Secondary | ICD-10-CM

## 2018-08-12 DIAGNOSIS — R69 Illness, unspecified: Secondary | ICD-10-CM | POA: Diagnosis not present

## 2018-08-12 NOTE — Progress Notes (Signed)
Client: Andrea [name]  Date: 08/12/18  Time: 9:08-10:10  Type of Therapy: Individual Therapy  Axis I/II Diagnosis:? Major Depressive Disorder, severe; Posttraumatic Stress Disorder  Treatment goals addressed: Andrea [name] would like to find new purpose in life, grieve the loss of her son, decrease depressive symptoms and suicidal ideations.  Interventions: CBT, Motivational Interviewing, Psychoeducation, Coping Skill Building  Summary: Client Andrea [name], 42yo female who presents with MDD and PTSD, due to recent suicide death of her son. Counselor using therapeutic interventions to address suicide ideations and depressive symptoms and to address grief and loss issues and life without her son.  Therapist Response: Caedyn met with Counselor for individual therapy. Counselor joined with Andrea [name] as she shared about past treatments, current investigation, suicidal ideations, grief process, support system, memories of her and her son and her goals for the future. Counselor assessed current psychiatric symptoms and life stressors with Andrea [name]. Andrea [name] stated, "I'm not going to kill myself, but I'd be ok if it was by natural causes" and "I am jealous of the people who are dying and get to leave this cruel world" and "I don't care if I live or if I die." She reports motivation to live now is to prevent more grief for her father to deal with. Counselor assessed safety in the home. Andrea [name] reports father is hypervigilant about checking in on her and assuring that she does not have access to anything that she could harm or kill herself with. She reported promising to her psychiatrist for 20 years now that she would not kill herself. Counselor provided psychoeducation on grief and loss tasks and cycle and explored where she is now. Counselor gave Andrea [name] permission to grieve and to celebrate her son's life. Counselor explored ways Andrea [name] could connect with other parents who have been through similar situations.  Counselor and Andrea [name] discussed path of treatment and new goal.  Suicidal/Homicidal: Andrea [name] is at high risk of committing suicide due recent suicide death of her son. She is aware of suicide prevention, crisis plan and helplines. Andrea [name] is aware of a variety of coping skills to use to combat preoccupations with death. Andrea [name] lives with her father who is monitoring her well-being. Andrea [name] will continue to discuss suicide ideations in therapy and with psychiatrist.  Plan: To return in 1 week. Will apply skills learned in session at home, until next session.  ?   , LCSW   

## 2018-08-13 ENCOUNTER — Other Ambulatory Visit (HOSPITAL_COMMUNITY): Payer: Medicare HMO

## 2018-08-14 ENCOUNTER — Other Ambulatory Visit (HOSPITAL_COMMUNITY): Payer: Medicare HMO

## 2018-08-17 ENCOUNTER — Other Ambulatory Visit (HOSPITAL_COMMUNITY): Payer: Medicare HMO

## 2018-08-18 ENCOUNTER — Other Ambulatory Visit (HOSPITAL_COMMUNITY): Payer: Medicare HMO

## 2018-08-19 ENCOUNTER — Other Ambulatory Visit (HOSPITAL_COMMUNITY): Payer: Medicare HMO

## 2018-08-20 ENCOUNTER — Other Ambulatory Visit (HOSPITAL_COMMUNITY): Payer: Medicare HMO

## 2018-08-21 ENCOUNTER — Other Ambulatory Visit (HOSPITAL_COMMUNITY): Payer: Medicare HMO

## 2018-08-24 ENCOUNTER — Encounter (HOSPITAL_COMMUNITY): Payer: Self-pay | Admitting: Psychiatry

## 2018-08-24 ENCOUNTER — Ambulatory Visit (INDEPENDENT_AMBULATORY_CARE_PROVIDER_SITE_OTHER): Payer: Medicare HMO | Admitting: Psychiatry

## 2018-08-24 ENCOUNTER — Other Ambulatory Visit (HOSPITAL_COMMUNITY): Payer: Medicare HMO

## 2018-08-24 ENCOUNTER — Other Ambulatory Visit: Payer: Self-pay

## 2018-08-24 DIAGNOSIS — R4589 Other symptoms and signs involving emotional state: Secondary | ICD-10-CM | POA: Diagnosis not present

## 2018-08-24 DIAGNOSIS — F4310 Post-traumatic stress disorder, unspecified: Secondary | ICD-10-CM | POA: Diagnosis not present

## 2018-08-24 DIAGNOSIS — F332 Major depressive disorder, recurrent severe without psychotic features: Secondary | ICD-10-CM

## 2018-08-24 DIAGNOSIS — R69 Illness, unspecified: Secondary | ICD-10-CM | POA: Diagnosis not present

## 2018-08-24 NOTE — Progress Notes (Signed)
Virtual Visit via Telephone Note  I connected with ZURIAH BORDAS on 08/24/18 at  1:30 PM EDT by telephone and verified that I am speaking with the correct person using two identifiers.   I discussed the limitations, risks, security and privacy concerns of performing an evaluation and management service by telephone and the availability of in person appointments. I also discussed with the patient that there may be a patient responsible charge related to this service. The patient expressed understanding and agreed to proceed.   History of Present Illness: MDD, PTSD and difficulty coping due to the recent loss of her son and chronic adverse life experiences.    Observations/Objective: Counselor met with [name] via telephone for individual therapy. Counselor assessed psychiatric symptoms and life stressors. Gaige reported continual grief symptoms related to the loss of her son, such as isolating, talking to him, talking about him to others, feelings of anger, questioning, and feelings of wanting to be with him. Counselor provided supportive listening and psychoeducation about grief and loss. Counselor praised [place] for her self-awareness, allowing herself to grieve and reach out for support. Counselor discussed additional coping skills for Delesa to consider and explored her concerns around the role of faith/religion in his death and her grieving process. Counselor gathered information on Eastyn's past coping skills that have been harmful in the past. Elesha denies any substance use or suicidal plan or intent.   Assessment and Plan: Dorrine appreciated being able to meet via phone today and would like to consider using Webex for our next session.   Follow Up Instructions: Counselor will set next appointment for 1 week and Abiageal will continue to express her grief in healthy ways within her support system.    I discussed the assessment and treatment plan with the patient. The  patient was provided an opportunity to ask questions and all were answered. The patient agreed with the plan and demonstrated an understanding of the instructions.   The patient was advised to call back or seek an in-person evaluation if the symptoms worsen or if the condition fails to improve as anticipated.  I provided 60 minutes of non-face-to-face time during this encounter.   Lise Auer, LCSW

## 2018-08-25 ENCOUNTER — Other Ambulatory Visit (HOSPITAL_COMMUNITY): Payer: Medicare HMO

## 2018-08-26 ENCOUNTER — Other Ambulatory Visit (HOSPITAL_COMMUNITY): Payer: Medicare HMO

## 2018-08-27 ENCOUNTER — Other Ambulatory Visit (HOSPITAL_COMMUNITY): Payer: Medicare HMO

## 2018-08-28 ENCOUNTER — Other Ambulatory Visit (HOSPITAL_COMMUNITY): Payer: Medicare HMO

## 2018-08-31 ENCOUNTER — Other Ambulatory Visit (HOSPITAL_COMMUNITY): Payer: Medicare HMO

## 2018-09-01 ENCOUNTER — Other Ambulatory Visit (HOSPITAL_COMMUNITY): Payer: Medicare HMO

## 2018-09-02 ENCOUNTER — Other Ambulatory Visit (HOSPITAL_COMMUNITY): Payer: Medicare HMO

## 2018-09-03 ENCOUNTER — Encounter (HOSPITAL_COMMUNITY): Payer: Self-pay | Admitting: Psychiatry

## 2018-09-03 ENCOUNTER — Other Ambulatory Visit: Payer: Self-pay

## 2018-09-03 ENCOUNTER — Ambulatory Visit (INDEPENDENT_AMBULATORY_CARE_PROVIDER_SITE_OTHER): Payer: Medicare HMO | Admitting: Psychiatry

## 2018-09-03 ENCOUNTER — Other Ambulatory Visit (HOSPITAL_COMMUNITY): Payer: Medicare HMO

## 2018-09-03 DIAGNOSIS — F4310 Post-traumatic stress disorder, unspecified: Secondary | ICD-10-CM | POA: Diagnosis not present

## 2018-09-03 DIAGNOSIS — R4589 Other symptoms and signs involving emotional state: Secondary | ICD-10-CM | POA: Diagnosis not present

## 2018-09-03 DIAGNOSIS — R69 Illness, unspecified: Secondary | ICD-10-CM | POA: Diagnosis not present

## 2018-09-03 DIAGNOSIS — F332 Major depressive disorder, recurrent severe without psychotic features: Secondary | ICD-10-CM

## 2018-09-03 NOTE — Progress Notes (Signed)
Virtual Visit via Video Note  I connected with [name] on 09/03/18 at 12:30 PM EDT by a video enabled telemedicine application and verified that I am speaking with the correct person using two identifiers.   I discussed the limitations of evaluation and management by telemedicine and the availability of in person appointments. The patient expressed understanding and agreed to proceed.  History of Present Illness: MDD, PTSD and Difficulty coping due to recent loss by suicide of her youngest son, relational conflicts and grief and loss issues.    Observations/Objective: Counselor met with [name] via [employer] for individual therapy. Counselor assessed for psychiatric symptoms. AmeLie remains severely depressed, with disassociative episodes, racing thoughts, thoughts of wanting to die in her sleep, isolating, disturbing flashbacks, and crying spells. Mera reports that her support system contemplated bringing her Behavioral Health last Friday because she reperienced a significant trauma trigger which sent her into a "spiral" and disassociating, until her friend was able to get her back into reality and she was able to go to sleep. Counselor provided support and psychoeducation around the grief and loss stages she is experiencing. She remains in the bargaining/questoining and anger stages. Rayleigh reports questioning her faith, the scene of the suicide, talking with her deceased son and longing to be with him in the after life. Amaryllis wants to find her purpose in life, she has a small support system and a good relationship with her doctor who has followed her suicidal ideations for years. Counselor validated her feelings and allowed her to speak freely about her negative cognitions, angry feelings and questions to help release the pain. Counselor praised her for joining the Hospice support group and for connecting with her closest friend and father when she is in pain  emotionally.  Assessment and Plan: Counselor encouraged her to follow the crisis and safety plan. Counselor will e-mail her updated crisis line numbers to utilize in times of need. Counselor and Florrie will meet again on Monday.   Follow Up Instructions: Counselor will send Webex link for next session.    I discussed the assessment and treatment plan with the patient. The patient was provided an opportunity to ask questions and all were answered. The patient agreed with the plan and demonstrated an understanding of the instructions.   The patient was advised to call back or seek an in-person evaluation if the symptoms worsen or if the condition fails to improve as anticipated.  I provided 65 minutes of non-face-to-face time during this encounter.   Lise Auer, LCSW

## 2018-09-04 ENCOUNTER — Other Ambulatory Visit (HOSPITAL_COMMUNITY): Payer: Medicare HMO

## 2018-09-07 ENCOUNTER — Encounter (HOSPITAL_COMMUNITY): Payer: Self-pay | Admitting: Psychiatry

## 2018-09-07 ENCOUNTER — Other Ambulatory Visit (HOSPITAL_COMMUNITY): Payer: Medicare HMO

## 2018-09-07 ENCOUNTER — Other Ambulatory Visit: Payer: Self-pay

## 2018-09-07 ENCOUNTER — Ambulatory Visit (INDEPENDENT_AMBULATORY_CARE_PROVIDER_SITE_OTHER): Payer: Medicare HMO | Admitting: Psychiatry

## 2018-09-07 DIAGNOSIS — F332 Major depressive disorder, recurrent severe without psychotic features: Secondary | ICD-10-CM

## 2018-09-07 DIAGNOSIS — R4589 Other symptoms and signs involving emotional state: Secondary | ICD-10-CM

## 2018-09-07 DIAGNOSIS — F4310 Post-traumatic stress disorder, unspecified: Secondary | ICD-10-CM

## 2018-09-07 DIAGNOSIS — R69 Illness, unspecified: Secondary | ICD-10-CM | POA: Diagnosis not present

## 2018-09-07 NOTE — Progress Notes (Signed)
Virtual Visit via Video Note  I connected with [name] on 09/07/18 at 12:30 PM EDT by a video enabled telemedicine application and verified that I am speaking with the correct person using two identifiers.   I discussed the limitations of evaluation and management by telemedicine and the availability of in person appointments. The patient expressed understanding and agreed to proceed.  History of Present Illness: MDD, PTSD and difficulty coping related to her sons passing via suicide.    Observations/Objective: Pharmacist, hospital met via Webex to for individual therapy. Counselor assessed mental health symptoms. Daizy reported that she continues to long to be with her son. She denies intent to harm herself, but continues to want something bad or deadly to happen. She reports feeling numb, floating through life not feeling anything. Dissociative episodes persist. Counselor checked to see If Anyra has utilized crisis resources and support group information. Counselor processed healthy coping skills- including encouraging her to use CBT and DBT skills, as well as journaling. Counselor and Gem processed questionings about her faith, afterlife, and details surrounding her death. Kierre requested support around communicating with her support system about no longer celebrating. Counselor encouraged Shawana in her openness of communication and the progress she is making. Ardeth remains feeling helpless and hopeless. Reports having issues breathing knowing her son can no longer breathe. Counselor will continue helping her along the grieving process.   Assessment and Plan: Counselor and Dlisa will meet in 1 week and ongoing to address treatment plan goals. Tillie will focus on CBT and DBT skills to combat depressive thoughts.    Follow Up Instructions: Counselor will set up webex link for next session.    I discussed the assessment and treatment plan with the patient. The  patient was provided an opportunity to ask questions and all were answered. The patient agreed with the plan and demonstrated an understanding of the instructions.   The patient was advised to call back or seek an in-person evaluation if the symptoms worsen or if the condition fails to improve as anticipated.  I provided 55 minutes of non-face-to-face time during this encounter.   Lise Auer, LCSW

## 2018-09-08 ENCOUNTER — Other Ambulatory Visit (HOSPITAL_COMMUNITY): Payer: Medicare HMO

## 2018-09-09 ENCOUNTER — Other Ambulatory Visit (HOSPITAL_COMMUNITY): Payer: Medicare HMO

## 2018-09-10 ENCOUNTER — Other Ambulatory Visit (HOSPITAL_COMMUNITY): Payer: Medicare HMO

## 2018-09-11 ENCOUNTER — Other Ambulatory Visit (HOSPITAL_COMMUNITY): Payer: Medicare HMO

## 2018-09-14 ENCOUNTER — Other Ambulatory Visit (HOSPITAL_COMMUNITY): Payer: Medicare HMO

## 2018-09-15 ENCOUNTER — Other Ambulatory Visit: Payer: Self-pay

## 2018-09-15 ENCOUNTER — Encounter (HOSPITAL_COMMUNITY): Payer: Self-pay | Admitting: Psychiatry

## 2018-09-15 ENCOUNTER — Ambulatory Visit (INDEPENDENT_AMBULATORY_CARE_PROVIDER_SITE_OTHER): Payer: Medicare HMO | Admitting: Psychiatry

## 2018-09-15 ENCOUNTER — Other Ambulatory Visit (HOSPITAL_COMMUNITY): Payer: Medicare HMO

## 2018-09-15 DIAGNOSIS — F4310 Post-traumatic stress disorder, unspecified: Secondary | ICD-10-CM | POA: Diagnosis not present

## 2018-09-15 DIAGNOSIS — R69 Illness, unspecified: Secondary | ICD-10-CM | POA: Diagnosis not present

## 2018-09-15 DIAGNOSIS — F332 Major depressive disorder, recurrent severe without psychotic features: Secondary | ICD-10-CM

## 2018-09-15 DIAGNOSIS — R4589 Other symptoms and signs involving emotional state: Secondary | ICD-10-CM

## 2018-09-15 NOTE — Progress Notes (Signed)
Virtual Visit via Video Note  I connected with Andrea [name] on 09/15/18 at 11:00 AM EDT by a video enabled telemedicine application and verified that I am speaking with the correct person using two identifiers.   I discussed the limitations of evaluation and management by telemedicine and the availability of in person appointments. The patient expressed understanding and agreed to proceed.  History of Present Illness: MDD, PTSD and difficulty coping due to recent loss of son by suicide, medical conditions and adverse life experiences.    Observations/Objective: Counselor met with Andrea [name] via [employer] for individual therapy. Counselor assessed mental health symptoms. Andrea [name] continues to have passive suicidal ideations, thoughts of wanting to wake up dead, or to die of other natural causes. Andrea [name] continues to have depressive and anxiety symptoms, which she attempts to manage with coping skills and communicating with her limited support system. Counselor provided empathetic and therapeutic listening skills and processed emotions with her, focusing on the grief and loss cycle. Counselor provided additional coping skills for her to try as an outlet for her emotions. Counselor discussed the reading of the suicide letters her son left and we processed there the investigation is with the [facility]. Counselor prompted Andrea [name] to share a happy memory she has of her son to end the session and encouraged her to write out thoughts and feelings in a letter or journal style.   Assessment and Plan: Counselor will continue to meet with Andrea [name] weekly to address treatment plan goals. Andrea [name] will start writing letters and journal for [place] to discuss in session as an outlet for her emotions. Andrea [name] will continue to follow her safety and crisis management plan.   Follow Up Instructions: Counselor will set up Webex for next session.   I discussed the assessment and treatment plan with the  patient. The patient was provided an opportunity to ask questions and all were answered. The patient agreed with the plan and demonstrated an understanding of the instructions.   The patient was advised to call back or seek an in-person evaluation if the symptoms worsen or if the condition fails to improve as anticipated.  I provided 60 minutes of non-face-to-face time during this encounter.   Lise Auer, LCSW

## 2018-09-16 ENCOUNTER — Other Ambulatory Visit: Payer: Self-pay | Admitting: Nurse Practitioner

## 2018-09-16 ENCOUNTER — Other Ambulatory Visit (HOSPITAL_COMMUNITY): Payer: Medicare HMO

## 2018-09-16 DIAGNOSIS — E559 Vitamin D deficiency, unspecified: Secondary | ICD-10-CM

## 2018-09-17 ENCOUNTER — Other Ambulatory Visit (HOSPITAL_COMMUNITY): Payer: Medicare HMO

## 2018-09-18 ENCOUNTER — Other Ambulatory Visit: Payer: Self-pay

## 2018-09-18 ENCOUNTER — Ambulatory Visit: Payer: Medicare HMO

## 2018-09-18 ENCOUNTER — Telehealth: Payer: Self-pay | Admitting: *Deleted

## 2018-09-18 ENCOUNTER — Other Ambulatory Visit (HOSPITAL_COMMUNITY): Payer: Medicare HMO

## 2018-09-18 ENCOUNTER — Encounter: Payer: Medicare HMO | Admitting: Family

## 2018-09-18 NOTE — Telephone Encounter (Signed)
Called pt x3 to start her medicare wellness visit, pt did not answer, LMOM we will cancel her appt and she can call to reschedule

## 2018-09-22 ENCOUNTER — Ambulatory Visit (INDEPENDENT_AMBULATORY_CARE_PROVIDER_SITE_OTHER): Payer: Medicare HMO | Admitting: Psychiatry

## 2018-09-22 ENCOUNTER — Other Ambulatory Visit: Payer: Self-pay

## 2018-09-22 ENCOUNTER — Encounter (HOSPITAL_COMMUNITY): Payer: Self-pay | Admitting: Psychiatry

## 2018-09-22 DIAGNOSIS — R4589 Other symptoms and signs involving emotional state: Secondary | ICD-10-CM | POA: Diagnosis not present

## 2018-09-22 DIAGNOSIS — F4310 Post-traumatic stress disorder, unspecified: Secondary | ICD-10-CM

## 2018-09-22 DIAGNOSIS — R69 Illness, unspecified: Secondary | ICD-10-CM | POA: Diagnosis not present

## 2018-09-22 DIAGNOSIS — F332 Major depressive disorder, recurrent severe without psychotic features: Secondary | ICD-10-CM

## 2018-09-23 NOTE — Progress Notes (Signed)
Virtual Visit via Video Note  I connected with Andrea [name] on 09/23/18 at 11:00 AM EDT by a video enabled telemedicine application and verified that I am speaking with the correct person using two identifiers.  Location: Patient: Andrea [name] Provider: Lise Auer, LCSW   I discussed the limitations of evaluation and management by telemedicine and the availability of in person appointments. The patient expressed understanding and agreed to proceed.  History of Present Illness: MDD, PTSD and difficulty coping due to passing of son earlier this year, familial problems, adverse life experiences and grief/loss issues.    Observations/Objective: Counselor met with Andrea [name] for individual therapy via Webex. Counselor assessed mental health concerns and daily functioning. Andrea [name] continues to have crying spells, staying in bed or sleeping most of the day, panic attacks, excessive worrying, grief responses, irritability and anger and thoughts of death and dying. Andrea [name] reports that her support systems is having difficulties with allowing her to be sad and grieve in her own way, which lead to additional issues with getting others involved without her permission who interacted with her in an inappropriate manner. Counselor processed this violation with her and reiterated the need for professionals to be involved and part of her crisis/safety plan that her whole support team needs to be aware of and follow. Counselor encouraged Andrea [name] to arrange a time for her support people to come together to revamp her crisis plan to all be on the same page. Andrea [name] agreed to doing so, then shared that the [facility] Department has set a meeting time for tomorrow to share the results of the investigation into her sons suicide/death. Counselor and Andrea [name] processed her thoughts and feelings around this meeting and discussed self-care before, during and after the meeting. Counselor reiterated safety and  well-being for Andrea [name] before ending the session.  Assessment and Plan: Counselor will continue meeting with Andrea [name] to address treatment plan goals. Andrea [name] will implement skills learned in session until next session. Andrea [name] will revamp crisis/safety plan and put into practice to cope with depressive state.   Follow Up Instructions: Counselor will set up Webex link for next session.   I discussed the assessment and treatment plan with the patient. The patient was provided an opportunity to ask questions and all were answered. The patient agreed with the plan and demonstrated an understanding of the instructions.   The patient was advised to call back or seek an in-person evaluation if the symptoms worsen or if the condition fails to improve as anticipated.  I provided 60 minutes of non-face-to-face time during this encounter.   Lise Auer, LCSW

## 2018-09-29 ENCOUNTER — Ambulatory Visit (INDEPENDENT_AMBULATORY_CARE_PROVIDER_SITE_OTHER): Payer: Medicare HMO | Admitting: Psychiatry

## 2018-09-29 ENCOUNTER — Encounter (HOSPITAL_COMMUNITY): Payer: Self-pay | Admitting: Psychiatry

## 2018-09-29 ENCOUNTER — Other Ambulatory Visit: Payer: Self-pay

## 2018-09-29 DIAGNOSIS — F332 Major depressive disorder, recurrent severe without psychotic features: Secondary | ICD-10-CM

## 2018-09-29 DIAGNOSIS — R69 Illness, unspecified: Secondary | ICD-10-CM | POA: Diagnosis not present

## 2018-09-29 DIAGNOSIS — R4589 Other symptoms and signs involving emotional state: Secondary | ICD-10-CM | POA: Diagnosis not present

## 2018-09-29 DIAGNOSIS — F4310 Post-traumatic stress disorder, unspecified: Secondary | ICD-10-CM | POA: Diagnosis not present

## 2018-09-29 NOTE — Progress Notes (Signed)
Virtual Visit via Video Note  I connected with [name] on 09/29/18 at 11:00 AM EDT by a video enabled telemedicine application and verified that I am speaking with the correct person using two identifiers.  Location: Patient: Andrea [name] Provider: Lise Auer, LCSW   I discussed the limitations of evaluation and management by telemedicine and the availability of in person appointments. The patient expressed understanding and agreed to proceed.  History of Present Illness: MDD, PTSD and difficulty coping due to loss of son, housing issues, family problems, grief and loss issues and adverse life experiences.    Observations/Objective: Counselor met with [name] for individual therapy via Webex. Counselor assessed MH symptoms and progress on treatment plan goals. Nyeema shared that she attended the meeting at the [facility] department to discuss the suicide scene of her son's death. Counselor processed emotions and reactions associated with the meeting. Counselor provided psychoeduation about shock. Ivis expressed feelings of confusion, anger, disassociating. Counselor explored ways she's coped since the meeting. Cherylann reported packing her things in a box, and rejecting her role as a mother for the time being. Counselor assessed therapeutic homework of revising her safety plan. Samaira reported that it was updated and shared with her support system. Cornie denied self-harm, but indorsed intrusive thoughts of passive suicidal ideations with no intent or plan.    Assessment and Plan: Counselor will continue to meet with [name] to address treatment plan goals. Anselma will continue to follow recommendations of providers and implement skills learned in session.  Follow Up Instructions: Counselor will send information for next session via Webex.   I discussed the assessment and treatment plan with the patient. The patient was provided an opportunity to ask questions  and all were answered. The patient agreed with the plan and demonstrated an understanding of the instructions.   The patient was advised to call back or seek an in-person evaluation if the symptoms worsen or if the condition fails to improve as anticipated.  I provided 60 minutes of non-face-to-face time during this encounter.   Lise Auer, LCSW

## 2018-10-01 ENCOUNTER — Other Ambulatory Visit: Payer: Self-pay | Admitting: Internal Medicine

## 2018-10-01 DIAGNOSIS — R69 Illness, unspecified: Secondary | ICD-10-CM | POA: Diagnosis not present

## 2018-10-06 ENCOUNTER — Ambulatory Visit (INDEPENDENT_AMBULATORY_CARE_PROVIDER_SITE_OTHER): Payer: Medicare HMO | Admitting: Psychiatry

## 2018-10-06 ENCOUNTER — Encounter (HOSPITAL_COMMUNITY): Payer: Self-pay | Admitting: Psychiatry

## 2018-10-06 ENCOUNTER — Other Ambulatory Visit: Payer: Self-pay

## 2018-10-06 DIAGNOSIS — R4589 Other symptoms and signs involving emotional state: Secondary | ICD-10-CM

## 2018-10-06 DIAGNOSIS — F4310 Post-traumatic stress disorder, unspecified: Secondary | ICD-10-CM | POA: Diagnosis not present

## 2018-10-06 DIAGNOSIS — F332 Major depressive disorder, recurrent severe without psychotic features: Secondary | ICD-10-CM | POA: Diagnosis not present

## 2018-10-06 DIAGNOSIS — R69 Illness, unspecified: Secondary | ICD-10-CM | POA: Diagnosis not present

## 2018-10-06 NOTE — Progress Notes (Signed)
Virtual Visit via Video Note  I connected with Andrea [name] on 10/06/18 at 11:00 AM EDT by a video enabled telemedicine application and verified that I am speaking with the correct person using two identifiers.  Location: Patient: Andrea [name] Provider: Lise Auer, LCSW   I discussed the limitations of evaluation and management by telemedicine and the availability of in person appointments. The patient expressed understanding and agreed to proceed.  History of Present Illness: MDD, PTSD, difficulty coping due to loss of her son, grief and loss issues, suicidal ideations, self-harm behaviors, unstable relationships, adverse life experiences.    Observations/Objective: Counselor met with Andrea [name] for individual therapy via Webex. Counselor assessed MH symptoms and progress on treatment plan goals. Andrea [name] reported episodes of suicidal ideation (wishing she would have killed herself when her son did, no desire to live, urges to be impulsive and reckless) self-harm behaviors (she cut herself superficially on her upper arm for the first time since she was 43yo to deal with the pain). Counselor processed thoughts, feelings and actions with Andrea [name], reviewing her safety plan, sharing alternative behaviors to do before cutting, discussing safety concerns with cutting, evaluating her beliefs and values and impact of committing suicide or self-harm behaviors. Counselor processed concerns she shared about her support system and their current coping skills and health. Counselor encouraged staying connected with [facility] and looking into other groups through [facility] for support. Counselor allowed Andrea [name] to process some of her grief and loss thoughts and feelings. Counselor encouraged Andrea [name] to implement healthy coping skills to deal with extreme emotions, as well as a focus on basic needs and self-care.   Assessment and Plan: Counselor will continue to meet with Andrea [name]  to address treatment plan goals. Andrea [name] will continue to follow recommendations of providers and implement skills learned in session.  Follow Up Instructions: Counselor will send information for next session via Webex.   I discussed the assessment and treatment plan with the patient. The patient was provided an opportunity to ask questions and all were answered. The patient agreed with the plan and demonstrated an understanding of the instructions.   The patient was advised to call back or seek an in-person evaluation if the symptoms worsen or if the condition fails to improve as anticipated.  I provided 70 minutes of non-face-to-face time during this encounter.   Lise Auer, LCSW

## 2018-10-13 ENCOUNTER — Encounter (HOSPITAL_COMMUNITY): Payer: Self-pay | Admitting: Psychiatry

## 2018-10-13 ENCOUNTER — Other Ambulatory Visit: Payer: Self-pay

## 2018-10-13 ENCOUNTER — Ambulatory Visit (INDEPENDENT_AMBULATORY_CARE_PROVIDER_SITE_OTHER): Payer: Medicare HMO | Admitting: Psychiatry

## 2018-10-13 DIAGNOSIS — R4589 Other symptoms and signs involving emotional state: Secondary | ICD-10-CM | POA: Diagnosis not present

## 2018-10-13 DIAGNOSIS — F4310 Post-traumatic stress disorder, unspecified: Secondary | ICD-10-CM | POA: Diagnosis not present

## 2018-10-13 DIAGNOSIS — F332 Major depressive disorder, recurrent severe without psychotic features: Secondary | ICD-10-CM | POA: Diagnosis not present

## 2018-10-13 DIAGNOSIS — R69 Illness, unspecified: Secondary | ICD-10-CM | POA: Diagnosis not present

## 2018-10-13 NOTE — Progress Notes (Signed)
Virtual Visit via Video Note  I connected with Andrea [name] on 10/13/18 at 11:00 AM EDT by a video enabled telemedicine application and verified that I am speaking with the correct person using two identifiers.  Location: Patient: Andrea [name] Provider: Lise Auer, LCSW   I discussed the limitations of evaluation and management by telemedicine and the availability of in person appointments. The patient expressed understanding and agreed to proceed.  History of Present Illness: MDD, PTSD and difficulty coping due to passing of her son by suicide earlier this year.     Observations/Objective: Counselor met with Andrea [name] for individual therapy via Webex. Counselor assessed MH symptoms and progress on treatment plan goals. Andrea [name] endorsed suicidal ideation (specific to wishing she would have used the extra bullet her son left in the gun on the day he killed himself, no current plan, means or intent) and denied new self-harm behaviors since the cutting 2 weeks ago. Andrea [name] shared that her grief and depression increases daily, yet reports being able to work through looking through her sons sentimental items. She continues to follow the safety plan involving her father and best friends. Andrea [name] reported having moments of intense anger related to issues within her neighborhood and with her "controlling" father. Counselor provided CBT interventions as we processed thoughts and feelings associated with her grief, loss, and healing. Counselor reviewed heathy coping skills and encouraged use of these. Counselor and client discussed her concerns with a previous provider and gave the appropriate numbers to report her concerns.   Assessment and Plan: Counselor will continue to meet with Andrea [name] to address treatment plan goals. Andrea [name] will continue to follow recommendations of providers and implement skills learned in session.  Follow Up Instructions: Counselor will send information for  next session via Webex.     I discussed the assessment and treatment plan with the patient. The patient was provided an opportunity to ask questions and all were answered. The patient agreed with the plan and demonstrated an understanding of the instructions.   The patient was advised to call back or seek an in-person evaluation if the symptoms worsen or if the condition fails to improve as anticipated.  I provided 70 minutes of non-face-to-face time during this encounter.   Lise Auer, LCSW

## 2018-10-20 ENCOUNTER — Encounter (HOSPITAL_COMMUNITY): Payer: Self-pay | Admitting: Psychiatry

## 2018-10-20 ENCOUNTER — Ambulatory Visit (INDEPENDENT_AMBULATORY_CARE_PROVIDER_SITE_OTHER): Payer: Medicare HMO | Admitting: Psychiatry

## 2018-10-20 ENCOUNTER — Other Ambulatory Visit: Payer: Self-pay

## 2018-10-20 DIAGNOSIS — F4310 Post-traumatic stress disorder, unspecified: Secondary | ICD-10-CM | POA: Diagnosis not present

## 2018-10-20 DIAGNOSIS — R4589 Other symptoms and signs involving emotional state: Secondary | ICD-10-CM

## 2018-10-20 DIAGNOSIS — F332 Major depressive disorder, recurrent severe without psychotic features: Secondary | ICD-10-CM

## 2018-10-20 DIAGNOSIS — R69 Illness, unspecified: Secondary | ICD-10-CM | POA: Diagnosis not present

## 2018-10-20 NOTE — Progress Notes (Signed)
Virtual Visit via Video Note  I connected with [name] on 10/20/18 at 11:00 AM EDT by a video enabled telemedicine application and verified that I am speaking with the correct person using two identifiers.  Location: Patient: Andrea [name] Provider: Lise Auer, LCSW   I discussed the limitations of evaluation and management by telemedicine and the availability of in person appointments. The patient expressed understanding and agreed to proceed.  History of Present Illness: MDD, PTSD and difficulty coping due to adverse life experiences and current complex grief and loss symptoms due to sons suicide earlier this year.    Observations/Objective: Counselor met with [name] for individual therapy via Webex. Counselor assessed MH symptoms and progress on treatment plan goals. Nichele endorses suicidal ideations (wanting to be with her son who passed, wishing she had taken her life he day he took his) but with no concrete plan or means. She denied self-harm behaviors. Marigny shared that last night was a very difficult and overwhleming night for her, and she and her dad debated taking her to [facility] to be assessed and admitted. Counselor processed her decision making and the causes leading up to her mentally breaking down. Drenda was overcome with grief, reporting that June is the month of her sons birthday and that the 30th of each month is hard because it was the day he passed. She listed off 5-10 different trauma triggers that have deeply been impacting her. Counselor discussed increasing her treatment, sharing about [facility], EMDR therapy, and ACT team. Morrison shared thoughts on those services. Counselor to send more information. Akisha reported that she has started services with [facility] and with [facility] and wants to see how that goes first because starting another service. Counselor explored her support system and we identified that the relationships  are not stable and consistent. Quandra shared that she reported her past therapist to the Board and received a response. Counselor processed her feelings about doing so, her suspicions and mistrust. Counselor reviewed daily functioning skills and encouraged Jensine to reach out to crisis services if needed between sessions.   Assessment and Plan: Counselor will continue to meet with [name] to address treatment plan goals. Fonnie will continue to follow recommendations of providers and implement skills learned in session.  Follow Up Instructions: Counselor will send information for next session via Webex.     I discussed the assessment and treatment plan with the patient. The patient was provided an opportunity to ask questions and all were answered. The patient agreed with the plan and demonstrated an understanding of the instructions.   The patient was advised to call back or seek an in-person evaluation if the symptoms worsen or if the condition fails to improve as anticipated.  I provided 60 minutes of non-face-to-face time during this encounter.   Lise Auer, LCSW

## 2018-10-27 ENCOUNTER — Ambulatory Visit (INDEPENDENT_AMBULATORY_CARE_PROVIDER_SITE_OTHER): Payer: Medicare HMO | Admitting: Psychiatry

## 2018-10-27 ENCOUNTER — Other Ambulatory Visit: Payer: Self-pay

## 2018-10-27 DIAGNOSIS — R4589 Other symptoms and signs involving emotional state: Secondary | ICD-10-CM | POA: Diagnosis not present

## 2018-10-27 DIAGNOSIS — F4310 Post-traumatic stress disorder, unspecified: Secondary | ICD-10-CM | POA: Diagnosis not present

## 2018-10-27 DIAGNOSIS — R69 Illness, unspecified: Secondary | ICD-10-CM | POA: Diagnosis not present

## 2018-10-27 DIAGNOSIS — F332 Major depressive disorder, recurrent severe without psychotic features: Secondary | ICD-10-CM | POA: Diagnosis not present

## 2018-10-27 NOTE — Progress Notes (Signed)
Virtual Visit via Video Note  I connected with Andrea [name] on 10/27/18 at 11:00 AM EDT by a video enabled telemedicine application and verified that I am speaking with the correct person using two identifiers.  Location: Patient: Andrea [name] Provider: Lise Auer, LCSW   I discussed the limitations of evaluation and management by telemedicine and the availability of in person appointments. The patient expressed understanding and agreed to proceed.  History of Present Illness: MDD, difficulty coping and complex post traumatic stress disorder due to the tragic loss of her son earlier this year.    Observations/Objective: Counselor met with Andrea [name] for individual therapy via Webex. Counselor assessed MH symptoms and progress on treatment plan goals. Andrea [name] endorsed suicidal ideations (urge to jump off of a mountain, fixation on knives in the home, wishing to never wake up, desire to be with her deceased son, wishing she had used the bullet he left in the gun on the day of her death, researching methods to end her life) or self-harm behaviors (reports pinching self, bumping into things, cut herself superficially on upper arm in early May as a former coping skill, not eating). Andrea [name] reported no current intent, plan or means identified to attempt suicide. Andrea [name] shared that anxiety and trauma responses have been increased due to the instability of her support system. Andrea [name] described a situation with her father, though well intentions, it caused her to have a panic attack, disassociate and feel set back mentally and emotionally. Andrea [name] explained that this sparked her to contact [facility], who were very helpful and even have put in place services for her father to better understand how to support her. Counselor praised Andrea [name] for communicating her needs and utilizing her crisis plan. Counselor provided psychoeducation on her current symptoms and discussed safety concerns and  additional treatment to address suicidal ideations and self-harm. Andrea [name] and heard in her supplemental treatments (Hospice, Grief Support, [facility] and Peer Support, Medication Management). Counselor processed feelings around her sons upcoming first birthday post-passing. Counselor and Andrea [name] decided that being assessed and potentially admitted to inpatient behavioral health for safety concerns as it gets closer to June 20th, her son's birthday. Andrea [name] gave verbal permission for Counselor to reach out to her other supports. Counselor will also obtain written consent to do so.   Assessment and Plan: Counselor will continue to meet with Andrea [name] to address treatment plan goals. Andrea [name] will continue to follow recommendations of providers and implement skills learned in session.  Follow Up Instructions: Counselor will send information for next session via Webex.     I discussed the assessment and treatment plan with the patient. The patient was provided an opportunity to ask questions and all were answered. The patient agreed with the plan and demonstrated an understanding of the instructions.   The patient was advised to call back or seek an in-person evaluation if the symptoms worsen or if the condition fails to improve as anticipated.  I provided 70 minutes of non-face-to-face time during this encounter.   Lise Auer, LCSW

## 2018-10-28 ENCOUNTER — Encounter (HOSPITAL_COMMUNITY): Payer: Self-pay | Admitting: Psychiatry

## 2018-10-28 ENCOUNTER — Ambulatory Visit (INDEPENDENT_AMBULATORY_CARE_PROVIDER_SITE_OTHER): Payer: Medicare HMO | Admitting: Psychiatry

## 2018-10-28 DIAGNOSIS — R4589 Other symptoms and signs involving emotional state: Secondary | ICD-10-CM

## 2018-10-28 DIAGNOSIS — F332 Major depressive disorder, recurrent severe without psychotic features: Secondary | ICD-10-CM | POA: Diagnosis not present

## 2018-10-28 DIAGNOSIS — R69 Illness, unspecified: Secondary | ICD-10-CM | POA: Diagnosis not present

## 2018-10-28 DIAGNOSIS — F4310 Post-traumatic stress disorder, unspecified: Secondary | ICD-10-CM | POA: Diagnosis not present

## 2018-10-29 ENCOUNTER — Emergency Department (HOSPITAL_COMMUNITY)
Admission: EM | Admit: 2018-10-29 | Discharge: 2018-10-29 | Disposition: A | Discharge status: Other Acute Inpatient Hospital | Payer: Medicare HMO | Attending: Emergency Medicine | Admitting: Emergency Medicine

## 2018-10-29 ENCOUNTER — Other Ambulatory Visit: Payer: Self-pay

## 2018-10-29 ENCOUNTER — Encounter (HOSPITAL_COMMUNITY): Payer: Self-pay | Admitting: Emergency Medicine

## 2018-10-29 ENCOUNTER — Inpatient Hospital Stay (HOSPITAL_COMMUNITY)
Admission: AD | Admit: 2018-10-29 | Discharge: 2018-11-09 | DRG: 885 | Disposition: A | Discharge status: Home / Self Care | Payer: Medicare HMO | Source: Intra-hospital | Attending: Psychiatry | Admitting: Psychiatry

## 2018-10-29 DIAGNOSIS — Z91410 Personal history of adult physical and sexual abuse: Secondary | ICD-10-CM

## 2018-10-29 DIAGNOSIS — X789XXA Intentional self-harm by unspecified sharp object, initial encounter: Secondary | ICD-10-CM | POA: Diagnosis not present

## 2018-10-29 DIAGNOSIS — Z90722 Acquired absence of ovaries, bilateral: Secondary | ICD-10-CM

## 2018-10-29 DIAGNOSIS — M1990 Unspecified osteoarthritis, unspecified site: Secondary | ICD-10-CM | POA: Diagnosis not present

## 2018-10-29 DIAGNOSIS — T43595A Adverse effect of other antipsychotics and neuroleptics, initial encounter: Secondary | ICD-10-CM | POA: Diagnosis not present

## 2018-10-29 DIAGNOSIS — S41011A Laceration without foreign body of right shoulder, initial encounter: Secondary | ICD-10-CM | POA: Diagnosis not present

## 2018-10-29 DIAGNOSIS — Z832 Family history of diseases of the blood and blood-forming organs and certain disorders involving the immune mechanism: Secondary | ICD-10-CM

## 2018-10-29 DIAGNOSIS — Z825 Family history of asthma and other chronic lower respiratory diseases: Secondary | ICD-10-CM

## 2018-10-29 DIAGNOSIS — G4700 Insomnia, unspecified: Secondary | ICD-10-CM | POA: Diagnosis present

## 2018-10-29 DIAGNOSIS — Z79891 Long term (current) use of opiate analgesic: Secondary | ICD-10-CM

## 2018-10-29 DIAGNOSIS — Y92239 Unspecified place in hospital as the place of occurrence of the external cause: Secondary | ICD-10-CM | POA: Diagnosis not present

## 2018-10-29 DIAGNOSIS — Y929 Unspecified place or not applicable: Secondary | ICD-10-CM | POA: Insufficient documentation

## 2018-10-29 DIAGNOSIS — Z9049 Acquired absence of other specified parts of digestive tract: Secondary | ICD-10-CM | POA: Diagnosis not present

## 2018-10-29 DIAGNOSIS — Z818 Family history of other mental and behavioral disorders: Secondary | ICD-10-CM | POA: Diagnosis not present

## 2018-10-29 DIAGNOSIS — I10 Essential (primary) hypertension: Secondary | ICD-10-CM | POA: Insufficient documentation

## 2018-10-29 DIAGNOSIS — E039 Hypothyroidism, unspecified: Secondary | ICD-10-CM | POA: Diagnosis present

## 2018-10-29 DIAGNOSIS — Z90710 Acquired absence of both cervix and uterus: Secondary | ICD-10-CM

## 2018-10-29 DIAGNOSIS — Y9389 Activity, other specified: Secondary | ICD-10-CM | POA: Insufficient documentation

## 2018-10-29 DIAGNOSIS — S31110A Laceration without foreign body of abdominal wall, right upper quadrant without penetration into peritoneal cavity, initial encounter: Secondary | ICD-10-CM | POA: Insufficient documentation

## 2018-10-29 DIAGNOSIS — K219 Gastro-esophageal reflux disease without esophagitis: Secondary | ICD-10-CM | POA: Diagnosis not present

## 2018-10-29 DIAGNOSIS — E7800 Pure hypercholesterolemia, unspecified: Secondary | ICD-10-CM | POA: Diagnosis present

## 2018-10-29 DIAGNOSIS — Z79890 Hormone replacement therapy (postmenopausal): Secondary | ICD-10-CM

## 2018-10-29 DIAGNOSIS — Z87891 Personal history of nicotine dependence: Secondary | ICD-10-CM | POA: Diagnosis not present

## 2018-10-29 DIAGNOSIS — Y999 Unspecified external cause status: Secondary | ICD-10-CM | POA: Diagnosis not present

## 2018-10-29 DIAGNOSIS — Z86711 Personal history of pulmonary embolism: Secondary | ICD-10-CM | POA: Insufficient documentation

## 2018-10-29 DIAGNOSIS — Z915 Personal history of self-harm: Secondary | ICD-10-CM | POA: Insufficient documentation

## 2018-10-29 DIAGNOSIS — Z62810 Personal history of physical and sexual abuse in childhood: Secondary | ICD-10-CM | POA: Diagnosis present

## 2018-10-29 DIAGNOSIS — F332 Major depressive disorder, recurrent severe without psychotic features: Secondary | ICD-10-CM | POA: Insufficient documentation

## 2018-10-29 DIAGNOSIS — Z7982 Long term (current) use of aspirin: Secondary | ICD-10-CM

## 2018-10-29 DIAGNOSIS — Z03818 Encounter for observation for suspected exposure to other biological agents ruled out: Secondary | ICD-10-CM | POA: Diagnosis not present

## 2018-10-29 DIAGNOSIS — Z8349 Family history of other endocrine, nutritional and metabolic diseases: Secondary | ICD-10-CM

## 2018-10-29 DIAGNOSIS — Z79899 Other long term (current) drug therapy: Secondary | ICD-10-CM | POA: Diagnosis not present

## 2018-10-29 DIAGNOSIS — E785 Hyperlipidemia, unspecified: Secondary | ICD-10-CM | POA: Diagnosis present

## 2018-10-29 DIAGNOSIS — F603 Borderline personality disorder: Secondary | ICD-10-CM | POA: Diagnosis present

## 2018-10-29 DIAGNOSIS — F4310 Post-traumatic stress disorder, unspecified: Secondary | ICD-10-CM | POA: Diagnosis not present

## 2018-10-29 DIAGNOSIS — Z20828 Contact with and (suspected) exposure to other viral communicable diseases: Secondary | ICD-10-CM | POA: Diagnosis not present

## 2018-10-29 DIAGNOSIS — Z8249 Family history of ischemic heart disease and other diseases of the circulatory system: Secondary | ICD-10-CM

## 2018-10-29 DIAGNOSIS — R45851 Suicidal ideations: Secondary | ICD-10-CM

## 2018-10-29 DIAGNOSIS — R69 Illness, unspecified: Secondary | ICD-10-CM | POA: Diagnosis not present

## 2018-10-29 DIAGNOSIS — F410 Panic disorder [episodic paroxysmal anxiety] without agoraphobia: Secondary | ICD-10-CM | POA: Diagnosis present

## 2018-10-29 DIAGNOSIS — I341 Nonrheumatic mitral (valve) prolapse: Secondary | ICD-10-CM | POA: Diagnosis present

## 2018-10-29 LAB — COMPREHENSIVE METABOLIC PANEL
ALT: 21 U/L (ref 0–44)
AST: 23 U/L (ref 15–41)
Albumin: 4.6 g/dL (ref 3.5–5.0)
Alkaline Phosphatase: 63 U/L (ref 38–126)
Anion gap: 10 (ref 5–15)
BUN: 9 mg/dL (ref 6–20)
CO2: 26 mmol/L (ref 22–32)
Calcium: 10.1 mg/dL (ref 8.9–10.3)
Chloride: 103 mmol/L (ref 98–111)
Creatinine, Ser: 0.85 mg/dL (ref 0.44–1.00)
GFR calc Af Amer: >60 mL/min (ref >60)
GFR calc non Af Amer: >60 mL/min (ref >60)
Glucose, Bld: 123 mg/dL — ABNORMAL HIGH (ref 70–99)
Potassium: 4.1 mmol/L (ref 3.5–5.1)
Sodium: 139 mmol/L (ref 135–145)
Total Bilirubin: 1 mg/dL (ref 0.3–1.2)
Total Protein: 7.5 g/dL (ref 6.5–8.1)

## 2018-10-29 LAB — CBC WITH DIFFERENTIAL/PLATELET
Abs Immature Granulocytes: 0.01 10*3/uL (ref 0.00–0.07)
Basophils Absolute: 0 10*3/uL (ref 0.0–0.1)
Basophils Relative: 0 %
Eosinophils Absolute: 0.1 10*3/uL (ref 0.0–0.5)
Eosinophils Relative: 3 %
HCT: 42.7 % (ref 36.0–46.0)
Hemoglobin: 14.1 g/dL (ref 12.0–15.0)
Immature Granulocytes: 0 %
Lymphocytes Relative: 36 %
Lymphs Abs: 1.8 10*3/uL (ref 0.7–4.0)
MCH: 29.7 pg (ref 26.0–34.0)
MCHC: 33 g/dL (ref 30.0–36.0)
MCV: 89.9 fL (ref 80.0–100.0)
Monocytes Absolute: 0.3 10*3/uL (ref 0.1–1.0)
Monocytes Relative: 6 %
Neutro Abs: 2.7 10*3/uL (ref 1.7–7.7)
Neutrophils Relative %: 55 %
Platelets: 213 10*3/uL (ref 150–400)
RBC: 4.75 MIL/uL (ref 3.87–5.11)
RDW: 11.5 % (ref 11.5–15.5)
WBC: 5 10*3/uL (ref 4.0–10.5)
nRBC: 0 % (ref 0.0–0.2)

## 2018-10-29 LAB — I-STAT BETA HCG BLOOD, ED (MC, WL, AP ONLY): I-stat hCG, quantitative: 7.1 m[IU]/mL — ABNORMAL HIGH (ref <5)

## 2018-10-29 LAB — RAPID URINE DRUG SCREEN, HOSP PERFORMED
Amphetamines: NOT DETECTED
Barbiturates: NOT DETECTED
Benzodiazepines: POSITIVE — AB
Cocaine: NOT DETECTED
Opiates: NOT DETECTED
Tetrahydrocannabinol: POSITIVE — AB

## 2018-10-29 LAB — HCG, QUANTITATIVE, PREGNANCY: hCG, Beta Chain, Quant, S: 8 m[IU]/mL — ABNORMAL HIGH (ref <5)

## 2018-10-29 LAB — ETHANOL: Alcohol, Ethyl (B): <10 mg/dL (ref <10)

## 2018-10-29 LAB — SALICYLATE LEVEL: Salicylate Lvl: <7 mg/dL (ref 2.8–30.0)

## 2018-10-29 LAB — SARS CORONAVIRUS 2 BY RT PCR (HOSPITAL ORDER, PERFORMED IN ~~LOC~~ HOSPITAL LAB): SARS Coronavirus 2: NEGATIVE

## 2018-10-29 LAB — ACETAMINOPHEN LEVEL: Acetaminophen (Tylenol), Serum: <10 ug/mL — ABNORMAL LOW (ref 10–30)

## 2018-10-29 MED ORDER — PANTOPRAZOLE SODIUM 40 MG PO TBEC
80.0000 mg | DELAYED_RELEASE_TABLET | Freq: Every day | ORAL | Status: DC
  Administered 2018-10-30 – 2018-11-09 (×11): 80 mg via ORAL
  Filled 2018-10-29 (×13): qty 2

## 2018-10-29 MED ORDER — MAGNESIUM HYDROXIDE 400 MG/5ML PO SUSP: 30.0000 mL | Freq: Every day | ORAL | Status: DC | PRN

## 2018-10-29 MED ORDER — LEVOTHYROXINE SODIUM 125 MCG PO TABS
125.0000 ug | ORAL_TABLET | Freq: Every day | ORAL | Status: DC
  Administered 2018-10-30 – 2018-11-09 (×11): 125 ug via ORAL
  Filled 2018-10-29 (×16): qty 1

## 2018-10-29 MED ORDER — ALPRAZOLAM 0.5 MG PO TABS
0.5000 mg | ORAL_TABLET | Freq: Two times a day (BID) | ORAL | Status: DC | PRN
  Administered 2018-10-29: 0.5 mg via ORAL
  Filled 2018-10-29: qty 1

## 2018-10-29 MED ORDER — ROSUVASTATIN CALCIUM 10 MG PO TABS
10.0000 mg | ORAL_TABLET | Freq: Every day | ORAL | Status: DC
  Administered 2018-10-29 – 2018-11-09 (×12): 10 mg via ORAL
  Filled 2018-10-29 (×15): qty 1

## 2018-10-29 MED ORDER — ACETAMINOPHEN 325 MG PO TABS
650.0000 mg | ORAL_TABLET | Freq: Four times a day (QID) | ORAL | Status: DC | PRN
  Administered 2018-10-30 – 2018-11-07 (×7): 650 mg via ORAL
  Filled 2018-10-29 (×7): qty 2

## 2018-10-29 MED ORDER — ALUM & MAG HYDROXIDE-SIMETH 200-200-20 MG/5ML PO SUSP: 30.0000 mL | ORAL | Status: DC | PRN

## 2018-10-29 MED ORDER — FLUOXETINE HCL 20 MG PO CAPS
80.0000 mg | ORAL_CAPSULE | Freq: Every day | ORAL | Status: DC
  Filled 2018-10-29 (×6): qty 4

## 2018-10-29 MED ORDER — METOPROLOL TARTRATE 25 MG PO TABS
25.0000 mg | ORAL_TABLET | Freq: Two times a day (BID) | ORAL | Status: DC
  Administered 2018-10-29 – 2018-11-05 (×14): 25 mg via ORAL
  Filled 2018-10-29 (×19): qty 1

## 2018-10-29 MED ORDER — ASPIRIN EC 81 MG PO TBEC
81.0000 mg | DELAYED_RELEASE_TABLET | Freq: Every day | ORAL | Status: DC
  Administered 2018-10-30 – 2018-11-09 (×11): 81 mg via ORAL
  Filled 2018-10-29 (×14): qty 1

## 2018-10-29 MED ORDER — FLUOXETINE HCL 40 MG PO CAPS: 80.0000 mg | ORAL_CAPSULE | Freq: Every day | ORAL | Status: DC

## 2018-10-29 MED ORDER — VITAMIN D 25 MCG (1000 UNIT) PO TABS
1000.0000 [IU] | ORAL_TABLET | Freq: Every day | ORAL | Status: DC
Start: 2018-10-30 — End: 2018-11-09
  Administered 2018-10-30 – 2018-11-09 (×11): 1000 [IU] via ORAL
  Filled 2018-10-29 (×14): qty 1

## 2018-10-29 MED ORDER — ALPRAZOLAM ER 1 MG PO TB24
2.0000 mg | ORAL_TABLET | Freq: Every day | ORAL | Status: DC
  Administered 2018-10-30: 2 mg via ORAL
  Filled 2018-10-29: qty 2

## 2018-10-29 MED ORDER — PRAZOSIN HCL 1 MG PO CAPS
1.0000 mg | ORAL_CAPSULE | Freq: Every day | ORAL | Status: DC
  Administered 2018-10-29 – 2018-11-08 (×11): 1 mg via ORAL
  Filled 2018-10-29 (×14): qty 1

## 2018-10-29 MED ORDER — TRAZODONE HCL 100 MG PO TABS
200.0000 mg | ORAL_TABLET | Freq: Every evening | ORAL | Status: DC | PRN
  Administered 2018-10-29: 200 mg via ORAL
  Filled 2018-10-29: qty 2

## 2018-10-29 NOTE — Tx Team (Signed)
Initial Treatment Plan 10/29/2018 8:38 PM Andrea [name] [id number]    PATIENT STRESSORS: Loss of Son to suicide Traumatic event   PATIENT STRENGTHS: Ability for insight Average or above average intelligence Communication skills General fund of knowledge Physical Health Special hobby/interest Supportive family/friends   PATIENT IDENTIFIED PROBLEMS:   "I want help to deal with my grief and suicidal thoughts"                   DISCHARGE CRITERIA:  Ability to meet basic life and health needs Adequate post-discharge living arrangements Improved stabilization in mood, thinking, and/or behavior Motivation to continue treatment in a less acute level of care Need for constant or close observation no longer present Reduction of life-threatening or endangering symptoms to within safe limits Safe-care adequate arrangements made Verbal commitment to aftercare and medication compliance  PRELIMINARY DISCHARGE PLAN: Outpatient therapy Return to previous living arrangement  PATIENT/FAMILY INVOLVEMENT: This treatment plan has been presented to and reviewed with the patient, Andrea [name].  The patient and family have been given the opportunity to ask questions and make suggestions.  Dannielle Burn, RN 10/29/2018, 8:38 PM
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
## 2018-10-29 NOTE — Progress Notes (Signed)
She was speaking with one of the techs for about 10 min and per tech she expressed she was going to join her son who killed himself on his birthday which is 6/20. She reports poor sleep and has asked writer to give her all the medicine she has available to her at [facility] so she can sleep and wont have nightmares which she frequently does per her report. She states her Xanax RX is 2 mg of extended release and here it is 0.5 mg BID prn. Encouraged her to talk with the Dr about it tomorrow.  Support offered. Will continue to monitor for safety and assess needs.

## 2018-10-29 NOTE — ED Notes (Signed)
Bed: [facility] Expected date:  Expected time:  Means of arrival:  Comments: 

## 2018-10-29 NOTE — Progress Notes (Addendum)
Patient ID: Andrea [name], female   DOB: 19-Oct-1975, 43 y.o.   MRN: [id number]  Pt was seen and recommended for Inpatient hospitalization. Pt has been accepted at [facility], bed 404-2. Pt's I stat hCG was 7.1. Requesting hCG be drawn prior to transport to [facility].   Ethelene Hal, FNP-C 10/29/2018       1301  Patient's chart reviewed. Reviewed the information documented and agree with the treatment plan.  Buford Dresser, DO 10/29/18 1:48 PM

## 2018-10-29 NOTE — ED Triage Notes (Signed)
Per pt, states her 45 y/o son committed suicide 1 year ago-states his birthday is next week-states she keeps a suicide journal, states she has been waiting for him to come get her-patient has superficial cuts on right stomach and right upper arm-states she has the means to kill herself, has a plan-states she cant be without her son

## 2018-10-29 NOTE — Progress Notes (Signed)
[facility] Admission Note: Pt is a 43 year old female admitted for suicidal ideation after finding her son dead of an apparent suicide.  The patient stated that her son had hung himself, then shot himself and that she attempted cpr even though she knew that he had been dead for several hours.  Pt stated that her son killed himself because his paternal family didn't accept that he was gay and that his birthday is on June 20.  Pt was hospitalized previously at [facility].  She stated that she had been cutting herself and has superficial abrasions on her R abdomen and R shoulder.  She has HTN, anxiety, and depression but denies any other physical problems at this time.  Pt is able to contract for safety.   Pt searched and admitted to the unit per routine.  Level 3 checks initiated and maintained.  Safety maintained.

## 2018-10-29 NOTE — BH Assessment (Signed)
[facility] Assessment Progress Note  Per Marvia Pickles, NP, this pt requires psychiatric hospitalization at this time.  Leonia Reader, RN, [facility] has assigned pt to [facility] Rm 404-2 after pt provides a urine sample and is swabbed for Covid-19 testing.  Pt has signed Voluntary Admission and Consent for Treatment, as well as Consent to Release Information to Chucky May, MD, to [facility], LCSW, and to her support group with the [place], and notification calls have been placed to all.  Signed forms have been faxed to [facility].  Pt's nurse, Erline Levine, has been notified, and agrees to send original paperwork along with pt via [facility], and to call report to [phone].  Jalene Mullet, [place] Coordinator [phone]

## 2018-10-29 NOTE — Progress Notes (Signed)
Woke her up to come get her HS and prn meds. She spoke with Probation officer at [employer] re her son, his death and her belief that he is coming to get her on his birthday and if he doesn't she has a plan and the ability to kill herself. She states she feels she died when he did and verbalizes she is having irrational thoughts but cant help it. She is very invested in her feelings about this and no encouragement support or alternative suggestions could be heard by her at this time.Will continue to monitor for safety.

## 2018-10-29 NOTE — ED Provider Notes (Signed)
[facility] DEPT Provider Note   CSN: [id number] Arrival date & time: 10/29/18  1015    History   Chief Complaint Chief Complaint  Patient presents with  . Suicidal    HPI Andrea [name] is a 43 y.o. female with history of major depressive disorder, anxiety, protein S deficiency, GERD, HLD, HTN, hypothyroidism, pulmonary embolism in 2003 presents today for suicidal ideations and hallucinations.  Patient was sent in today with her father by her crisis team.  Patient reports that is the one-year anniversary of her sons committing suicide which has been causing her increased stress and hopelessness.  Patient reports that over the past few months that she occasionally sees her son who committed suicide and hears his voice.  Patient reports that it will be her son's birthday on 11/07/2018 and that he is coming to get her.  Patient is convinced that he is waiting until the last minute to pick her up because they need to begin together on his birthday.  Patient reports that her son does not come and pick her up by 11/07/2018 that she will need to commit suicide.  Patient's plan is to rent a vehicle and drive out of state to a hotel and then suffocate herself with duct tape and a plastic bag.  Patient reports that she has begun cutting for the first time in 30 years and reports superficial lacerations to her right side and right shoulder.  She reports that these have been healing well over the past few days.  She denies any other injury, she denies medication abuse.  Denies fever/chills, cough/shortness of breath, chest pain, abdominal pain, nausea/vomiting, drug abuse or any additional concerns.  Of note patient reports that her pulmonary embolism stem from a combination of sedentary lifestyle at her job, protein S deficiency, tobacco use and estrogen use.  She was on a blood thinner for 6 months before she was cleared by her hematologist with no complications since.     HPI  Past Medical History:  Diagnosis Date  . Anxiety   . Arthritis    ruptured lumbar disc-careful with positioning  . Blood dyscrasia    protein s deficiency-no treatment since 2006  . Depression   . GERD (gastroesophageal reflux disease)   . Headache(784.0)   . Hemorrhoids   . High cholesterol   . Hyperlipemia   . Hypertension   . Hypothyroidism   . IBS (irritable bowel syndrome)   . MVP (mitral valve prolapse)    echo per dr Dagmar Hait  . Protein S deficiency ([place]) 2003   dvt/pulmonary embolus-on coumadin for 6 months then off-Sees [facility]  . Pulmonary embolism ([place])   . PVC (premature ventricular contraction)     Patient Active Problem List   Diagnosis Date Noted  . Severe recurrent major depression without psychotic features ([place]) 06/20/2018  . Hyperlipidemia LDL goal <100 02/21/2017  . High risk medication use 02/21/2017  . Prediabetes 05/22/2016  . Allergic rhinitis due to pollen 07/19/2015  . Hypertension 02/01/2015  . Dyspnea 10/22/2012  . Diarrhea 09/16/2012  . Nausea alone 09/16/2012  . Abdominal pain, other specified site 09/16/2012  . ANAL FISSURE 04/11/2010  . CANDIDIASIS, VAGINAL 11/25/2008  . HEMORRHOIDS-EXTERNAL 11/25/2008  . RECTAL BLEEDING 11/25/2008  . ABDOMINAL PAIN-LUQ 11/25/2008  . NONSPECIFIC ABN FINDING RAD & OTH EXAM GI TRACT 11/25/2008  . Hypothyroidism 09/14/2007  . GERD 09/14/2007  . IBS 09/14/2007  . TONSILLECTOMY, HX OF 09/14/2007    Past Surgical History:  Procedure Laterality Date  . CESAREAN SECTION  2006  . CHOLECYSTECTOMY N/A 11/03/2014   Procedure: LAPAROSCOPIC CHOLECYSTECTOMY WITH INTRAOPERATIVE CHOLANGIOGRAM;  Surgeon: Georganna Skeans, MD;  Location: [facility];  Service: General;  Laterality: N/A;  . LAPAROSCOPIC ASSISTED VAGINAL HYSTERECTOMY  03/27/2011   Procedure: LAPAROSCOPIC ASSISTED VAGINAL HYSTERECTOMY;  Surgeon: Cyril Mourning, MD;  Location: [facility];  Service: Gynecology;  Laterality: N/A;  .  SALPINGOOPHORECTOMY  03/27/2011   Procedure: SALPINGO OOPHERECTOMY;  Surgeon: Cyril Mourning, MD;  Location: [facility];  Service: Gynecology;  Laterality: Bilateral;  . TONSILLECTOMY  92  . vaginal reconstructive surgery  2001     OB History   No obstetric history on file.      Home Medications    Prior to Admission medications   Medication Sig Start Date End Date Taking? Authorizing Provider  ALPRAZolam (XANAX XR) 2 MG 24 hr tablet Take 1 tablet (2 mg total) by mouth daily. For anxiety 07/05/18   Lindell Spar I, NP  ALPRAZolam Duanne Moron) 0.5 MG tablet Take 1 tablet (0.5 mg total) by mouth 2 (two) times daily as needed for anxiety. 07/04/18   Lindell Spar I, NP  aspirin EC 81 MG tablet Take 81 mg by mouth daily.    [provider]  cholecalciferol (VITAMIN D3) 25 MCG (1000 UT) tablet Take 1 tablet (1,000 Units total) by mouth daily. For bone health 07/05/18   Lindell Spar I, NP  colestipol (COLESTID) 1 g tablet TAKE 2 TABLETS(2 GRAMS) BY MOUTH TWICE DAILY 10/01/18   Irene Shipper, MD  dicyclomine (BENTYL) 20 MG tablet Take 1 tablet (20 mg total) by mouth 4 (four) times daily -  before meals and at bedtime. For bowel spasms 07/04/18   Lindell Spar I, NP  esomeprazole (NEXIUM) 40 MG capsule Take 1 capsule (40 mg total) by mouth daily. For acid reflux 07/04/18   Lindell Spar I, NP  FLUoxetine (PROZAC) 40 MG capsule Take 2 capsules (80 mg total) by mouth daily. For depression 07/04/18   Lindell Spar I, NP  metoprolol tartrate (LOPRESSOR) 25 MG tablet Take 1 tablet (25 mg total) by mouth 2 (two) times daily. For hypertension 07/04/18   Lindell Spar I, NP  prazosin (MINIPRESS) 1 MG capsule Take 1 mg by mouth at bedtime.    [provider]  rosuvastatin (CRESTOR) 20 MG tablet Take one tablet by mouth once daily: For high cholesterol 07/04/18   Nwoko, Herbert Pun I, NP  SYNTHROID 125 MCG tablet TAKE 1 TABLET(125 MCG) BY MOUTH DAILY BEFORE BREAKFAST: For thyroid hormone replacement 07/04/18   Lindell Spar I, NP  traZODone (DESYREL) 100 MG tablet Take 2 tablets (200 mg total) by mouth at bedtime as needed for sleep. 07/04/18   Lindell Spar I, NP  Vitamin D, Ergocalciferol, (DRISDOL) 1.25 MG (50000 UT) CAPS capsule TAKE 1 CAPSULE BY MOUTH EVERY 7 DAYS 09/17/18   Lauree Chandler, NP    Family History Family History  Problem Relation Age of Onset  . High blood pressure Mother   . Anxiety disorder Mother   . Depression Mother   . OCD Mother   . Physical abuse Mother   . Hyperlipidemia Father        fathers side of family  . High blood pressure Father   . ADD / ADHD Son   . Seizures Son   . ADD / ADHD Son   . Hypertension Other        entire family on both sides  .  Asthma Son   . Asthma Son   . Clotting disorder Maternal Uncle   . Clotting disorder Paternal Grandmother   . Heart disease Maternal Grandfather   . Alcohol abuse Maternal Grandfather   . Anxiety disorder Maternal Grandmother     Social History Social History   Tobacco Use  . Smoking status: Former Smoker    Packs/day: 1.00    Years: 15.00    Pack years: 15.00    Types: Cigarettes, E-cigarettes    Quit date: 03/18/2004    Years since quitting: 14.6  . Smokeless tobacco: Never Used  . Tobacco comment: States she vaps several times a weeks but no nicotine in the ones she uses  Substance Use Topics  . Alcohol use: Never    Frequency: Never  . Drug use: No     Allergies   Patient has no known allergies.   Review of Systems Review of Systems  Constitutional: Negative.  Negative for chills and fever.  Eyes: Negative.  Negative for visual disturbance.  Respiratory: Negative.  Negative for cough and shortness of breath.   Cardiovascular: Negative.  Negative for chest pain.  Gastrointestinal: Negative.  Negative for abdominal pain, diarrhea, nausea and vomiting.  Genitourinary: Negative.  Negative for dysuria and hematuria.  Skin: Positive for wound (Self-inflicted lacerations).  Neurological:  Negative.  Negative for syncope, weakness and headaches.  Psychiatric/Behavioral: Positive for hallucinations, self-injury and suicidal ideas. The patient is nervous/anxious.   All other systems reviewed and are negative.  Physical Exam Updated Vital Signs BP (!) 136/93 (BP Location: Right Arm)   Pulse (!) 103   Temp 98.6 F (37 C) (Oral)   Resp 18   LMP 03/19/2011   SpO2 98%   Physical Exam Exam conducted with a chaperone present (Examination chaperoned by [place]).  Constitutional:      General: She is not in acute distress.    Appearance: Normal appearance. She is well-developed. She is obese. She is not ill-appearing or diaphoretic.     Comments: Tearful  HENT:     Head: Normocephalic and atraumatic. No raccoon eyes or Battle's sign.     Jaw: There is normal jaw occlusion. No trismus.     Right Ear: External ear normal.     Left Ear: External ear normal.     Nose: Nose normal.     Mouth/Throat:     Mouth: Mucous membranes are moist.     Pharynx: Oropharynx is clear.  Eyes:     General: Vision grossly intact. Gaze aligned appropriately.     Extraocular Movements: Extraocular movements intact.     Conjunctiva/sclera: Conjunctivae normal.     Pupils: Pupils are equal, round, and reactive to light.  Neck:     Musculoskeletal: Full passive range of motion without pain, normal range of motion and neck supple.     Trachea: Trachea and phonation normal. No tracheal tenderness or tracheal deviation.  Cardiovascular:     Rate and Rhythm: Normal rate and regular rhythm.     Heart sounds: Normal heart sounds.  Pulmonary:     Effort: Pulmonary effort is normal. No accessory muscle usage or respiratory distress.     Breath sounds: Normal breath sounds and air entry.  Abdominal:     General: There is no distension.     Palpations: Abdomen is soft.     Tenderness: There is no abdominal tenderness. There is no guarding or rebound.  Musculoskeletal: Normal range of motion.  Right lower leg: Normal. She exhibits no tenderness. No edema.     Left lower leg: Normal. She exhibits no tenderness. No edema.     Comments: No midline C/T/L spinal tenderness to palpation, no paraspinal muscle tenderness, no deformity, crepitus, or step-off noted. No sign of injury to the neck or back. - Appropriate range of motion and strength with movement of all major joints.  Skin:    General: Skin is warm and dry.          Comments: Exam chaperoned by Estill Bamberg NT.  Patient with multiple superficial excoriations along the right shoulder and right abdomen, self-inflicted linear.  Appear to be healing well, no active bleeding appear 4-32 days old.  No increased warmth, streaking, drainage, fluctuance or induration.  Neurological:     Mental Status: She is alert.     GCS: GCS eye subscore is 4. GCS verbal subscore is 5. GCS motor subscore is 6.     Comments: Speech is clear and goal oriented, follows commands Major Cranial nerves without deficit, no facial droop Normal strength in upper and lower extremities bilaterally including dorsiflexion and plantar flexion, strong and equal grip strength Sensation normal to light and sharp touch Moves extremities without ataxia, coordination intact Normal finger to nose and rapid alternating movements Neg romberg, no pronator drift Normal gait  Psychiatric:        Attention and Perception: She perceives auditory and visual hallucinations.        Mood and Affect: Mood is anxious. Affect is tearful.        Speech: Speech normal.        Behavior: Behavior normal. Behavior is cooperative.        Thought Content: Thought content includes suicidal ideation. Thought content does not include homicidal ideation. Thought content includes suicidal plan. Thought content does not include homicidal plan.    ED Treatments / Results  Labs (all labs ordered are listed, but only abnormal results are displayed) Labs Reviewed  COMPREHENSIVE METABOLIC PANEL -  Abnormal; Notable for the following components:      Result Value   Glucose, Bld 123 (*)    All other components within normal limits  RAPID URINE DRUG SCREEN, HOSP PERFORMED - Abnormal; Notable for the following components:   Benzodiazepines POSITIVE (*)    Tetrahydrocannabinol POSITIVE (*)    All other components within normal limits  ACETAMINOPHEN LEVEL - Abnormal; Notable for the following components:   Acetaminophen (Tylenol), Serum <10 (*)    All other components within normal limits  I-STAT BETA HCG BLOOD, ED ([facility], [facility], [facility] ONLY) - Abnormal; Notable for the following components:   I-stat hCG, quantitative 7.1 (*)    All other components within normal limits  SARS CORONAVIRUS 2 (HOSPITAL ORDER, [facility])  ETHANOL  CBC WITH DIFFERENTIAL/PLATELET  SALICYLATE LEVEL  HCG, QUANTITATIVE, PREGNANCY    EKG EKG Interpretation  Date/Time:  Thursday October 29 2018 14:54:09 EDT Ventricular Rate:  92 PR Interval:    QRS Duration: 88 QT Interval:  357 QTC Calculation: 442 R Axis:   9 Text Interpretation:  Sinus rhythm Low voltage, precordial leads Borderline T abnormalities, diffuse leads no significant change from prior 2/20 Confirmed by Aletta Edouard [phone]) on 10/29/2018 3:09:19 PM   Radiology No results found.  Procedures Procedures (including critical care time)  Medications Ordered in ED Medications - No data to display   Initial Impression / Assessment and Plan / ED Course  I have reviewed  the triage vital signs and the nursing notes.  Pertinent labs & imaging results that were available during my care of the patient were reviewed by me and considered in my medical decision making (see chart for details).    Beta-hCG of 7.1, as patient with history of hysterectomy, suspect to be a false positive  Salicylate level negative Acetaminophen level negative Ethanol level negative CBC within normal limits CMP nonacute UDS positive for benzos  and THC EKG: Sinus rhythm Low voltage, precordial leads Borderline T abnormalities, diffuse leads no significant change from prior 2/20 Confirmed by Aletta Edouard  COVID 19 test pending. - Physical examination shows superficial lacerations of the right abdomen and right shoulder without signs of infection. No indication for antibiotics at this time.  Normal neuro exam. Patient is suicidal with a plan additionally endorsing hallucinations both auditory and visual.  Denies drug use or ingestion.  Lab work reassuring. Consult called to psych for evaluation.  Patient is medically cleared for psychiatric evaluation.   Note: Portions of this report may have been transcribed using voice recognition software. Every effort was made to ensure accuracy; however, inadvertent computerized transcription errors may still be present. Final Clinical Impressions(s) / ED Diagnoses   Final diagnoses:  Suicidal ideation    ED Discharge Orders    None       Gari Crown 10/29/18 1514    Drenda Freeze, MD 10/30/18 1453

## 2018-10-30 ENCOUNTER — Encounter (HOSPITAL_COMMUNITY): Payer: Self-pay | Admitting: Psychiatry

## 2018-10-30 DIAGNOSIS — F332 Major depressive disorder, recurrent severe without psychotic features: Principal | ICD-10-CM

## 2018-10-30 MED ORDER — COLESTIPOL HCL 1 G PO TABS
2.0000 g | ORAL_TABLET | Freq: Two times a day (BID) | ORAL | Status: DC
  Administered 2018-10-30 – 2018-11-09 (×22): 2 g via ORAL
  Filled 2018-10-30 (×27): qty 2

## 2018-10-30 MED ORDER — ALPRAZOLAM 0.5 MG PO TABS
0.5000 mg | ORAL_TABLET | Freq: Four times a day (QID) | ORAL | Status: DC | PRN
  Administered 2018-11-01 – 2018-11-08 (×11): 0.5 mg via ORAL
  Filled 2018-10-30 (×11): qty 1

## 2018-10-30 MED ORDER — TRAZODONE HCL 50 MG PO TABS
50.0000 mg | ORAL_TABLET | Freq: Every day | ORAL | Status: DC
  Administered 2018-10-30 – 2018-11-01 (×3): 50 mg via ORAL
  Filled 2018-10-30 (×6): qty 1

## 2018-10-30 MED ORDER — ALPRAZOLAM ER 1 MG PO TB24
2.0000 mg | ORAL_TABLET | Freq: Every day | ORAL | Status: DC
  Administered 2018-10-30 – 2018-11-09 (×11): 2 mg via ORAL
  Filled 2018-10-30 (×11): qty 2

## 2018-10-30 MED ORDER — FLUOXETINE HCL 20 MG PO CAPS
40.0000 mg | ORAL_CAPSULE | Freq: Every day | ORAL | Status: DC
  Administered 2018-10-31 – 2018-11-09 (×10): 40 mg via ORAL
  Filled 2018-10-30 (×12): qty 2

## 2018-10-30 MED ORDER — ALPRAZOLAM ER 1 MG PO TB24: 2.0000 mg | ORAL_TABLET | Freq: Two times a day (BID) | ORAL | Status: DC

## 2018-10-30 NOTE — Tx Team (Signed)
Interdisciplinary Treatment and Diagnostic Plan Update  10/30/2018 Time of Session:  Andrea [name] MRN: [id number]  Principal Diagnosis: <principal problem not specified>  Secondary Diagnoses: Active Problems:   MDD (major depressive disorder), recurrent severe, without psychosis ([place])   Current Medications:  Current Facility-Administered Medications  Medication Dose Route Frequency Provider Last Rate Last Dose  . acetaminophen (TYLENOL) tablet 650 mg  650 mg Oral Q6H PRN Ethelene Hal, NP      . ALPRAZolam (XANAX XR) 24 hr tablet 2 mg  2 mg Oral Daily Ethelene Hal, NP      . ALPRAZolam Duanne Moron) tablet 0.5 mg  0.5 mg Oral BID PRN Ethelene Hal, NP   0.5 mg at 10/29/18 2249  . alum & mag hydroxide-simeth (MAALOX/MYLANTA) 200-200-20 MG/5ML suspension 30 mL  30 mL Oral Q4H PRN Ethelene Hal, NP      . aspirin EC tablet 81 mg  81 mg Oral Daily Ethelene Hal, NP      . cholecalciferol (VITAMIN D3) tablet 1,000 Units  1,000 Units Oral Daily Ethelene Hal, NP      . colestipol (COLESTID) tablet 2 g  2 g Oral BID Lindon Romp A, NP   2 g at 10/30/18 0005  . FLUoxetine (PROZAC) capsule 80 mg  80 mg Oral Daily Cobos, Myer Peer, MD      . levothyroxine (SYNTHROID) tablet 125 mcg  125 mcg Oral Q0600 Ethelene Hal, NP   125 mcg at 10/30/18 [phone]  . magnesium hydroxide (MILK OF MAGNESIA) suspension 30 mL  30 mL Oral Daily PRN Ethelene Hal, NP      . metoprolol tartrate (LOPRESSOR) tablet 25 mg  25 mg Oral BID Ethelene Hal, NP   25 mg at 10/29/18 2249  . pantoprazole (PROTONIX) EC tablet 80 mg  80 mg Oral Q1200 Ethelene Hal, NP      . prazosin (MINIPRESS) capsule 1 mg  1 mg Oral QHS Ethelene Hal, NP   1 mg at 10/29/18 2249  . rosuvastatin (CRESTOR) tablet 10 mg  10 mg Oral Daily Ethelene Hal, NP   10 mg at 10/29/18 2249  . traZODone (DESYREL) tablet 200 mg  200 mg Oral QHS PRN Ethelene Hal, NP   200 mg at 10/29/18 2249   PTA Medications: Medications Prior to Admission  Medication Sig Dispense Refill Last Dose  . ezetimibe (ZETIA) 10 MG tablet Take 10 mg by mouth daily.     Marland Kitchen ALPRAZolam (XANAX XR) 2 MG 24 hr tablet Take 1 tablet (2 mg total) by mouth daily. For anxiety 5 tablet 0   . ALPRAZolam (XANAX) 0.5 MG tablet Take 1 tablet (0.5 mg total) by mouth 2 (two) times daily as needed for anxiety. 10 tablet 0   . ALPRAZolam (XANAX) 1 MG tablet Take 1 tablet by mouth 2 (two) times a day.     Marland Kitchen aspirin EC 81 MG tablet Take 81 mg by mouth daily.     . colestipol (COLESTID) 1 g tablet TAKE 2 TABLETS(2 GRAMS) BY MOUTH TWICE DAILY 120 tablet 1   . esomeprazole (NEXIUM) 40 MG capsule Take 1 capsule (40 mg total) by mouth daily. For acid reflux 5 capsule 0   . FLUoxetine (PROZAC) 40 MG capsule Take 2 capsules (80 mg total) by mouth daily. For depression (Patient taking differently: Take 40 mg by mouth daily. For depression) 60 capsule 0   . metoprolol tartrate (LOPRESSOR) 25 MG  tablet Take 1 tablet (25 mg total) by mouth 2 (two) times daily. For hypertension 5 tablet 0   . oxyCODONE-acetaminophen (PERCOCET/ROXICET) 5-325 MG tablet Take 1 tablet by mouth every 6 (six) hours as needed.     . prazosin (MINIPRESS) 1 MG capsule Take 2 mg by mouth 2 (two) times daily.      . rosuvastatin (CRESTOR) 20 MG tablet Take one tablet by mouth once daily: For high cholesterol 5 tablet 0   . SYNTHROID 125 MCG tablet TAKE 1 TABLET(125 MCG) BY MOUTH DAILY BEFORE BREAKFAST: For thyroid hormone replacement 5 tablet 0   . traZODone (DESYREL) 100 MG tablet Take 2 tablets (200 mg total) by mouth at bedtime as needed for sleep. 30 tablet 0   . Vitamin D, Ergocalciferol, (DRISDOL) 1.25 MG (50000 UT) CAPS capsule TAKE 1 CAPSULE BY MOUTH EVERY 7 DAYS 12 capsule 0     Patient Stressors: Loss of Son to suicide Traumatic event  Patient Strengths: Ability for insight Average or above average  intelligence Communication skills General fund of knowledge Physical Health Special hobby/interest Supportive family/friends  Treatment Modalities: Medication Management, Group therapy, Case management,  1 to 1 session with clinician, Psychoeducation, Recreational therapy.   Physician Treatment Plan for Primary Diagnosis: <principal problem not specified> Long Term Goal(s):     Short Term Goals:    Medication Management: Evaluate patient's response, side effects, and tolerance of medication regimen.  Therapeutic Interventions: 1 to 1 sessions, Unit Group sessions and Medication administration.  Evaluation of Outcomes: Not Met  Physician Treatment Plan for Secondary Diagnosis: Active Problems:   MDD (major depressive disorder), recurrent severe, without psychosis ([place])  Long Term Goal(s):     Short Term Goals:       Medication Management: Evaluate patient's response, side effects, and tolerance of medication regimen.  Therapeutic Interventions: 1 to 1 sessions, Unit Group sessions and Medication administration.  Evaluation of Outcomes: Not Met   RN Treatment Plan for Primary Diagnosis: <principal problem not specified> Long Term Goal(s): Knowledge of disease and therapeutic regimen to maintain health will improve  Short Term Goals: Ability to demonstrate self-control, Ability to participate in decision making will improve, Ability to verbalize feelings will improve, Ability to disclose and discuss suicidal ideas and Ability to identify and develop effective coping behaviors will improve  Medication Management: RN will administer medications as ordered by provider, will assess and evaluate patient's response and provide education to patient for prescribed medication. RN will report any adverse and/or side effects to prescribing provider.  Therapeutic Interventions: 1 on 1 counseling sessions, Psychoeducation, Medication administration, Evaluate responses to treatment, Monitor  vital signs and CBGs as ordered, Perform/monitor CIWA, COWS, AIMS and Fall Risk screenings as ordered, Perform wound care treatments as ordered.  Evaluation of Outcomes: Not Met   LCSW Treatment Plan for Primary Diagnosis: <principal problem not specified> Long Term Goal(s): Safe transition to appropriate next level of care at discharge, Engage patient in therapeutic group addressing interpersonal concerns.  Short Term Goals: Engage patient in aftercare planning with referrals and resources  Therapeutic Interventions: Assess for all discharge needs, 1 to 1 time with Social worker, Explore available resources and support systems, Assess for adequacy in community support network, Educate family and significant other(s) on suicide prevention, Complete Psychosocial Assessment, Interpersonal group therapy.  Evaluation of Outcomes: Not Met   Progress in Treatment: Attending groups: No. Participating in groups: No. Taking medication as prescribed: Yes. Toleration medication: Yes. Family/Significant other contact made: No, will contact:  if patient consents to collateral contacts Patient understands diagnosis: Yes. Discussing patient identified problems/goals with staff: Yes. Medical problems stabilized or resolved: Yes. Denies suicidal/homicidal ideation: No. Issues/concerns per patient self-inventory: No. Other:   New problem(s) identified: None   New Short Term/Long Term Goal(s): medication stabilization, elimination of SI thoughts, development of comprehensive mental wellness plan.    Patient Goals:    Discharge Plan or Barriers: CSW will continue to follow and assess for appropriate referrals and possible discharge planning.   Reason for Continuation of Hospitalization: Anxiety Depression Medication stabilization Suicidal ideation  Estimated Length of Stay: 3-5 days   Attendees: Patient: 10/30/2018 11:23 AM  Physician: Dr. Neita Garnet, MD 10/30/2018 11:23 AM  Nursing:  Sharl Ma.Viona Gilmore, RN 10/30/2018 11:23 AM  RN Care Manager: 10/30/2018 11:23 AM  Social Worker: Radonna Ricker, [place] 10/30/2018 11:23 AM  Recreational Therapist:  10/30/2018 11:23 AM  Other:  10/30/2018 11:23 AM  Other:  10/30/2018 11:23 AM  Other: 10/30/2018 11:23 AM    Scribe for Treatment Team: Marylee Floras, [place] 10/30/2018 11:23 AM

## 2018-10-30 NOTE — Progress Notes (Signed)
1:1 note- Patient is observed in her room preparing to get ready for bed. She has been up in the dayroom watching a movie earlier. Writer had spoken with patient 1:1 and she talked about her son is coming to get her on the 20th for her to go back with him. She talked about how her friends no longer talk with her since her son's death. Patient spoke at length about her son and how much she misses him. Support given and safety maintained with sitter at patient's side. 1:1 continues and patient is safe.

## 2018-10-30 NOTE — Progress Notes (Signed)
Care of patient taken over at 0100 , patient resting in bed quietly eyes closed, no distress noted or issues reported thus far on shift.

## 2018-10-30 NOTE — Progress Notes (Addendum)
Chaplain following for support around re-admission, grief and loss around son's death in Jul 03, 2022 exacerbated by son's upcoming birthday June 20.    Familiar with pt from prior hospitalization, participation in partial hospitalization program.     Tamiyah was welcoming of chaplain consult.  Reported worsening of symptoms - stating she had developed a plan for suicide. Described feeling that her son was going to "come get me" and if he did not come by his birthday, she was going to have to end her life in order to be with him.   Feelings of isolation complicated by older son (45) whom Zali has described as saying things that are unhelpful.  Chrisma had a close, supportive friend and describes this relationship changing in early June.  Jenniofer states when June came, she felt overwhlemed and started "dissociating" - describing pushing the feelings of grief away in hopes of getting through.    Provided support around loss, psycho-social education, orientation to unit and support at [facility] and Miyonna's reaching out to resources for support.  Throughout consult, Telesa would return to feelings of suicidality and feeling overwhelmed.  Acknowledged overwhelming and worked to orient to present moment.  Though weekend, Alyn made plan with chaplain to use note card with words on it that orient her to being safe on unit and honest with care team about how she is feeling.    Chaplain will follow up Monday, June 15 for continued support.
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
## 2018-10-30 NOTE — Progress Notes (Signed)
Pt presents with a sad affect and a depressed mood. Pt tearful on approach and expressed that she's waiting for her son to come and get her. Pt expressed that her son's birthday is on 6/20 and it's the first birthday she's celebrating without him since he committed suicide. Pt repeatedly stated she's waiting for her son to return on his birthday so she she can join him in heaven. Pt expressed if he doesn't come for her then she will go find him, meaning she has a suicide plan that she's considering. Pt verbalized she's emotionally tired and she misses her son. Pt stated she takes Prozac 40 mg at home instead of the 80 mg. Pt also stated she takes Minipress 2 mg in the am and at hs for flashbacks and nightmare. MD made aware.   Verbal support provided to the pt. Writer asked the chaplain to speak with the pt. Medications reviewed with pt. 15 minute checks performed for safety.   Pt compliant with tx plan.  [facility] NOVEL CORONAVIRUS (COVID-19) DAILY CHECK-OFF SYMPTOMS - answer yes or no to each - every day NO YES  Have you had a fever in the past 24 hours?  . Fever (Temp > 37.80C / 100F) X   Have you had any of these symptoms in the past 24 hours? . New Cough .  Sore Throat  .  Shortness of Breath .  Difficulty Breathing .  Unexplained Body Aches   X   Have you had any one of these symptoms in the past 24 hours not related to allergies?   . Runny Nose .  Nasal Congestion .  Sneezing   X   If you have had runny nose, nasal congestion, sneezing in the past 24 hours, has it worsened?  X   EXPOSURES - check yes or no X   Have you traveled outside the state in the past 14 days?  X   Have you been in contact with someone with a confirmed diagnosis of COVID-19 or PUI in the past 14 days without wearing appropriate PPE?  X   Have you been living in the same home as a person with confirmed diagnosis of COVID-19 or a PUI (household contact)?    X   Have you been diagnosed with COVID-19?    X               What to do next: Answered NO to all: Answered YES to anything:   Proceed with unit schedule Follow the [facility] Inpatient Flowsheet.
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
## 2018-10-30 NOTE — Progress Notes (Signed)
Patient ID: AZIYAH PROVENCAL, female   DOB: November 22, 1975, 43 y.o.   MRN: [id number]  1:1 Nursing Progress Note  D: MD reports patient did not confidently contract for safety with him and orders safety sitter. Patient states she is passively SI with no current plan but that she believes her son "will come for me soon". Patient informed she will have a sitter with her until provider feels she can contract for safety. Environment is secured. Sitter observed with patient per [employer].  A: Patient placed 1:1 observation per provider orders.  R: Patient remains safe on the unit at this time and verbalizes understanding. Will continue to monitor with 1:1 sitter, q15 min safety checks & q4 hour nursing assessments.
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
## 2018-10-31 NOTE — Plan of Care (Signed)
D: Pt alert and oriented on the unit. Pt engaging with RN staff and 1:1 sitter. Pt denies A/VH, HI, but still endorses SI. 1:1 sitter with pt. Pt was teary-eyed with flat affect and mood depressed. Pt stated that her goal for today is to "stop irrational thinking and stop feeling suicidal." Pt is cooperative.  A: Education, support and encouragement provided, q15 minute safety checks remain in effect. Medications administered per MD orders.  R: No reactions/side effects to medicine noted. Pt denies any concerns at this time, and verbally contracts for safety. Pt ambulating on the unit with no issues. Pt remains safe on the unit.   Problem: Medication: Goal: Compliance with prescribed medication regimen will improve Outcome: Progressing   Problem: Self-Concept: Goal: Ability to disclose and discuss suicidal ideas will improve Outcome: Progressing Goal: Will verbalize positive feelings about self Outcome: Progressing   Problem: Self-Concept: Goal: Will verbalize positive feelings about self Outcome: Progressing

## 2018-10-31 NOTE — Progress Notes (Signed)
1:1 Nursing note - Patient lying in bed asleep with no distress noted. 1:1 continues with sitter at bedside.

## 2018-10-31 NOTE — Progress Notes (Signed)
Patient ID: Andrea [name], female   DOB: [birth year], 43 y.o.   MRN: [id number]  1:1 RN note  D: Pt in her room sitting on her bed. Sitter with pt. No discomfort or distress noted. A: 1:1 continues for safety. R: Pt remains safe on the unit.

## 2018-10-31 NOTE — Progress Notes (Signed)
Patient ID: Andrea [name], female   DOB: 10-04-1975, 43 y.o.   MRN: [id number]   1:1 RN note  D: Pt ate lunch this afternoon and stated that her appetite is coming back slowly. Pt sat in the dayroom for a while with sitter before returning to her room. No discomfort or distress noted. A: 1:1 continues for safety. R: Pt remains safe on the unit.
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
## 2018-10-31 NOTE — Progress Notes (Signed)
Patient ID: Andrea [name], female   DOB: 02-27-1976, 43 y.o.   MRN: [id number]  1:1 RN note  D: Pt walking in the hallway and talking with sitter and other pts. Pt in her room talking with sitter and writing in her journal. No discomfort or distress noted. A: 1:1 continues for safety. R: Pt remains safe on the unit.

## 2018-10-31 NOTE — Progress Notes (Addendum)
[facility] MD Progress Note  10/31/2018 11:23 AM Andrea [name]  MRN:  [id number]  Subjective:  Andrea [name] reported " I lost touch with reality. "   Evaluations: Andrea [name] observed on a one-to-one for safety.  Reports chronic suicidal ideations states more recently she has been cutting.  Reports she feels she is obsessed with reality.  Patient continues to report flashbacks related to the death of her son.  States his birthday is coming up which causes for worsening depression and anxiety.  Reports taking medication as prescribed and tolerating well.  States she is has been unable to control her mood.  Patient reports remorse for putting her father through this difficult situation.  States she currently resides with her father who is been her biggest support since the passing of her son.  Patient reports strong relationship between she and her mother and her oldest son.  Patient to continue medications as directed.  Support and encouragement reassurance was provided.   Principal Problem: <principal problem not specified> Diagnosis: Active Problems:   MDD (major depressive disorder), recurrent severe, without psychosis ([place])  Total Time spent with patient: 15 minutes  Past Psychiatric History:  Past Medical History:  Past Medical History:  Diagnosis Date  . Anxiety   . Arthritis    ruptured lumbar disc-careful with positioning  . Blood dyscrasia    protein s deficiency-no treatment since 2006  . Depression   . GERD (gastroesophageal reflux disease)   . Headache(784.0)   . Hemorrhoids   . High cholesterol   . Hyperlipemia   . Hypertension   . Hypothyroidism   . IBS (irritable bowel syndrome)   . MVP (mitral valve prolapse)    echo per dr Dagmar Hait  . Protein S deficiency ([place]) 2003   dvt/pulmonary embolus-on coumadin for 6 months then off-Sees [facility]  . Pulmonary embolism ([place])   . PVC (premature ventricular contraction)     Past Surgical History:  Procedure Laterality Date   . CESAREAN SECTION  2006  . CHOLECYSTECTOMY N/A 11/03/2014   Procedure: LAPAROSCOPIC CHOLECYSTECTOMY WITH INTRAOPERATIVE CHOLANGIOGRAM;  Surgeon: Georganna Skeans, MD;  Location: [place];  Service: General;  Laterality: N/A;  . LAPAROSCOPIC ASSISTED VAGINAL HYSTERECTOMY  03/27/2011   Procedure: LAPAROSCOPIC ASSISTED VAGINAL HYSTERECTOMY;  Surgeon: Cyril Mourning, MD;  Location: [facility];  Service: Gynecology;  Laterality: N/A;  . SALPINGOOPHORECTOMY  03/27/2011   Procedure: SALPINGO OOPHERECTOMY;  Surgeon: Cyril Mourning, MD;  Location: [facility];  Service: Gynecology;  Laterality: Bilateral;  . TONSILLECTOMY  92  . vaginal reconstructive surgery  2001   Family History:  Family History  Problem Relation Age of Onset  . High blood pressure Mother   . Anxiety disorder Mother   . Depression Mother   . OCD Mother   . Physical abuse Mother   . Hyperlipidemia Father        fathers side of family  . High blood pressure Father   . ADD / ADHD Son   . Seizures Son   . ADD / ADHD Son   . Hypertension Other        entire family on both sides  . Asthma Son   . Asthma Son   . Clotting disorder Maternal Uncle   . Clotting disorder Paternal Grandmother   . Heart disease Maternal Grandfather   . Alcohol abuse Maternal Grandfather   . Anxiety disorder Maternal Grandmother    Family Psychiatric  History:  Social History:  Social History   Substance and  Sexual Activity  Alcohol Use Never  . Frequency: Never     Social History   Substance and Sexual Activity  Drug Use No    Social History   Socioeconomic History  . Marital status: Divorced    Spouse name: Not on file  . Number of children: 2  . Years of education: Not on file  . Highest education level: Not on file  Occupational History  . Occupation: nutrition    Employer: [employer]  Social Needs  . Financial resource strain: Not hard at all  . Food insecurity    Worry: Never true    Inability: Never true  .  Transportation needs    Medical: No    Non-medical: No  Tobacco Use  . Smoking status: Former Smoker    Packs/day: 1.00    Years: 15.00    Pack years: 15.00    Types: Cigarettes, E-cigarettes    Quit date: 03/18/2004    Years since quitting: 14.6  . Smokeless tobacco: Never Used  . Tobacco comment: States she vaps several times a weeks but no nicotine in the ones she uses  Substance and Sexual Activity  . Alcohol use: Never    Frequency: Never  . Drug use: No  . Sexual activity: Not Currently  Lifestyle  . Physical activity    Days per week: 3 days    Minutes per session: 30 min  . Stress: To some extent  Relationships  . Social connections    Talks on phone: More than three times a week    Gets together: More than three times a week    Attends religious service: Never    Active member of club or organization: No    Attends meetings of clubs or organizations: Never    Relationship status: Divorced  Other Topics Concern  . Not on file  Social History Narrative   Diet: Regular   Caffeine: Yes   Married- divorced, married in 2006   House: Yes, 2 persons   Pets: 1 dog   Current/Past profession: [employer] 2002-2013   Exercise: Yes, walking   Living Will: No    DNR: No    POA/HPOA: No       Additional Social History:                         Sleep: Fair  Appetite:  Fair  Current Medications: Current Facility-Administered Medications  Medication Dose Route Frequency Provider Last Rate Last Dose  . acetaminophen (TYLENOL) tablet 650 mg  650 mg Oral Q6H PRN Ethelene Hal, NP   650 mg at 10/30/18 1521  . ALPRAZolam (XANAX XR) 24 hr tablet 2 mg  2 mg Oral Daily Cobos, Myer Peer, MD   2 mg at 10/31/18 0811  . ALPRAZolam Duanne Moron) tablet 0.5 mg  0.5 mg Oral Q6H PRN Cobos, Myer Peer, MD      . alum & mag hydroxide-simeth (MAALOX/MYLANTA) 200-200-20 MG/5ML suspension 30 mL  30 mL Oral Q4H PRN Ethelene Hal, NP      . aspirin EC tablet 81 mg  81  mg Oral Daily Ethelene Hal, NP   81 mg at 10/31/18 0811  . cholecalciferol (VITAMIN D3) tablet 1,000 Units  1,000 Units Oral Daily Ethelene Hal, NP   1,000 Units at 10/31/18 [phone]  . colestipol (COLESTID) tablet 2 g  2 g Oral BID Lindon Romp A, NP   2 g at 10/31/18  [id number]  . FLUoxetine (PROZAC) capsule 40 mg  40 mg Oral Daily Cobos, Myer Peer, MD   40 mg at 10/31/18 0811  . levothyroxine (SYNTHROID) tablet 125 mcg  125 mcg Oral Q0600 Ethelene Hal, NP   125 mcg at 10/31/18 0636  . magnesium hydroxide (MILK OF MAGNESIA) suspension 30 mL  30 mL Oral Daily PRN Ethelene Hal, NP      . metoprolol tartrate (LOPRESSOR) tablet 25 mg  25 mg Oral BID Ethelene Hal, NP   25 mg at 10/31/18 0811  . pantoprazole (PROTONIX) EC tablet 80 mg  80 mg Oral Q1200 Ethelene Hal, NP   80 mg at 10/30/18 1203  . prazosin (MINIPRESS) capsule 1 mg  1 mg Oral QHS Ethelene Hal, NP   1 mg at 10/30/18 2120  . rosuvastatin (CRESTOR) tablet 10 mg  10 mg Oral Daily Ethelene Hal, NP   10 mg at 10/31/18 0240  . traZODone (DESYREL) tablet 50 mg  50 mg Oral QHS Cobos, Myer Peer, MD   50 mg at 10/30/18 2120    Lab Results:  Results for orders placed or performed during the hospital encounter of 10/29/18 (from the past 48 hour(s))  Comprehensive metabolic panel     Status: Abnormal   Collection Time: 10/29/18 11:29 AM  Result Value Ref Range   Sodium 139 135 - 145 mmol/L   Potassium 4.1 3.5 - 5.1 mmol/L   Chloride 103 98 - 111 mmol/L   CO2 26 22 - 32 mmol/L   Glucose, Bld 123 (H) 70 - 99 mg/dL   BUN 9 6 - 20 mg/dL   Creatinine, Ser 0.85 0.44 - 1.00 mg/dL   Calcium 10.1 8.9 - 10.3 mg/dL   Total Protein 7.5 6.5 - 8.1 g/dL   Albumin 4.6 3.5 - 5.0 g/dL   AST 23 15 - 41 U/L   ALT 21 0 - 44 U/L   Alkaline Phosphatase 63 38 - 126 U/L   Total Bilirubin 1.0 0.3 - 1.2 mg/dL   GFR calc non Af Amer >60 >60 mL/min   GFR calc Af Amer >60 >60 mL/min   Anion gap 10 5 -  15    Comment: Performed at [facility], [place] [address], [place], [place] [address]  Ethanol     Status: None   Collection Time: 10/29/18 11:29 AM  Result Value Ref Range   Alcohol, Ethyl (B) <10 <10 mg/dL    Comment: (NOTE) Lowest detectable limit for serum alcohol is 10 mg/dL. For medical purposes only. Performed at [facility], [place] [address], [place], [place] [address]   CBC with Diff     Status: None   Collection Time: 10/29/18 11:29 AM  Result Value Ref Range   WBC 5.0 4.0 - 10.5 K/uL   RBC 4.75 3.87 - 5.11 MIL/uL   Hemoglobin 14.1 12.0 - 15.0 g/dL   HCT 42.7 36.0 - 46.0 %   MCV 89.9 80.0 - 100.0 fL   MCH 29.7 26.0 - 34.0 pg   MCHC 33.0 30.0 - 36.0 g/dL   RDW 11.5 11.5 - 15.5 %   Platelets 213 150 - 400 K/uL   nRBC 0.0 0.0 - 0.2 %   Neutrophils Relative % 55 %   Neutro Abs 2.7 1.7 - 7.7 K/uL   Lymphocytes Relative 36 %   Lymphs Abs 1.8 0.7 - 4.0 K/uL   Monocytes Relative 6 %   Monocytes Absolute 0.3  0.1 - 1.0 K/uL   Eosinophils Relative 3 %   Eosinophils Absolute 0.1 0.0 - 0.5 K/uL   Basophils Relative 0 %   Basophils Absolute 0.0 0.0 - 0.1 K/uL   Immature Granulocytes 0 %   Abs Immature Granulocytes 0.01 0.00 - 0.07 K/uL    Comment: Performed at [facility], [place] [address] [place], [place] [address]  Salicylate level     Status: None   Collection Time: 10/29/18 11:29 AM  Result Value Ref Range   Salicylate Lvl <2.9 2.8 - 30.0 mg/dL    Comment: Performed at [facility], [place] [address] [place], [place] [address]  Acetaminophen level     Status: Abnormal   Collection Time: 10/29/18 11:29 AM  Result Value Ref Range   Acetaminophen (Tylenol), Serum <10 (L) 10 - 30 ug/mL    Comment: (NOTE) Therapeutic concentrations vary significantly. A range of 10-30 ug/mL  may be an effective concentration for many patients. However, some  are best treated at concentrations outside of this  range. Acetaminophen concentrations >150 ug/mL at 4 hours after ingestion  and >50 ug/mL at 12 hours after ingestion are often associated with  toxic reactions. Performed at [facility], [place] [address] [place], [place] [address]   hCG, quantitative, pregnancy     Status: Abnormal   Collection Time: 10/29/18 11:29 AM  Result Value Ref Range   hCG, Beta Chain, Quant, S 8 (H) <5 mIU/mL    Comment:          GEST. AGE      CONC.  (mIU/mL)   <=1 WEEK        5 - 50     2 WEEKS       50 - 500     3 WEEKS       100 - 10,000     4 WEEKS     1,000 - 30,000     5 WEEKS     3,500 - 115,000   6-8 WEEKS     12,000 - 270,000    12 WEEKS     15,000 - 220,000        FEMALE AND NON-PREGNANT FEMALE:     LESS THAN 5 mIU/mL Performed at [facility], [place] [address], [place], [place] [address]   I-Stat beta hCG blood, ED     Status: Abnormal   Collection Time: 10/29/18 11:42 AM  Result Value Ref Range   I-stat hCG, quantitative 7.1 (H) <5 mIU/mL   Comment 3            Comment:   GEST. AGE      CONC.  (mIU/mL)   <=1 WEEK        5 - 50     2 WEEKS       50 - 500     3 WEEKS       100 - 10,000     4 WEEKS     1,000 - 30,000        FEMALE AND NON-PREGNANT FEMALE:     LESS THAN 5 mIU/mL   Urine rapid drug screen (hosp performed)     Status: Abnormal   Collection Time: 10/29/18  1:49 PM  Result Value Ref Range   Opiates NONE DETECTED NONE DETECTED   Cocaine NONE DETECTED NONE DETECTED   Benzodiazepines POSITIVE (A) NONE DETECTED   Amphetamines NONE DETECTED NONE DETECTED   Tetrahydrocannabinol POSITIVE (A)  NONE DETECTED   Barbiturates NONE DETECTED NONE DETECTED    Comment: (NOTE) DRUG SCREEN FOR MEDICAL PURPOSES ONLY.  IF CONFIRMATION IS NEEDED FOR ANY PURPOSE, NOTIFY LAB WITHIN 5 DAYS. LOWEST DETECTABLE LIMITS FOR URINE DRUG SCREEN Drug Class                     Cutoff (ng/mL) Amphetamine and metabolites    1000 Barbiturate and metabolites     200 Benzodiazepine                 597 Tricyclics and metabolites     300 Opiates and metabolites        300 Cocaine and metabolites        300 THC                            50 Performed at [facility], [place] [address], [place], [place] [address]   SARS Coronavirus 2 (CEPHEID - Performed in [place] hospital lab), Hosp Order     Status: None   Collection Time: 10/29/18  1:49 PM   Specimen: Nasopharyngeal Swab  Result Value Ref Range   SARS Coronavirus 2 NEGATIVE NEGATIVE    Comment: (NOTE) If result is NEGATIVE SARS-CoV-2 target nucleic acids are NOT DETECTED. The SARS-CoV-2 RNA is generally detectable in upper and lower  respiratory specimens during the acute phase of infection. The lowest  concentration of SARS-CoV-2 viral copies this assay can detect is 250  copies / mL. A negative result does not preclude SARS-CoV-2 infection  and should not be used as the sole basis for treatment or other  patient management decisions.  A negative result may occur with  improper specimen collection / handling, submission of specimen other  than nasopharyngeal swab, presence of viral mutation(s) within the  areas targeted by this assay, and inadequate number of viral copies  (<250 copies / mL). A negative result must be combined with clinical  observations, patient history, and epidemiological information. If result is POSITIVE SARS-CoV-2 target nucleic acids are DETECTED. The SARS-CoV-2 RNA is generally detectable in upper and lower  respiratory specimens dur ing the acute phase of infection.  Positive  results are indicative of active infection with SARS-CoV-2.  Clinical  correlation with patient history and other diagnostic information is  necessary to determine patient infection status.  Positive results do  not rule out bacterial infection or co-infection with other viruses. If result is PRESUMPTIVE POSTIVE SARS-CoV-2 nucleic acids MAY BE PRESENT.   A presumptive  positive result was obtained on the submitted specimen  and confirmed on repeat testing.  While 2019 novel coronavirus  (SARS-CoV-2) nucleic acids may be present in the submitted sample  additional confirmatory testing may be necessary for epidemiological  and / or clinical management purposes  to differentiate between  SARS-CoV-2 and other Sarbecovirus currently known to infect humans.  If clinically indicated additional testing with an alternate test  methodology [phone]) is advised. The SARS-CoV-2 RNA is generally  detectable in upper and lower respiratory sp ecimens during the acute  phase of infection. The expected result is Negative. Fact Sheet for Patients:  StrictlyIdeas.no Fact Sheet for Healthcare Providers: BankingDealers.co.za This test is not yet approved or cleared by the [place] FDA and has been authorized for detection and/or diagnosis of SARS-CoV-2 by FDA under an Emergency Use Authorization (EUA).  This EUA will remain in effect (meaning this test can be  used) for the duration of the COVID-19 declaration under Section 564(b)(1) of the Act, 21 U.S.C. section 360bbb-3(b)(1), unless the authorization is terminated or revoked sooner. Performed at [facility], [place] [address], [place], [place] [address]     Blood Alcohol level:  Lab Results  Component Value Date   ETH <10 03/05/5101    Metabolic Disorder Labs: Lab Results  Component Value Date   HGBA1C 5.6 06/21/2018   MPG 114.02 06/21/2018   MPG 114 06/13/2017   No results found for: PROLACTIN Lab Results  Component Value Date   CHOL 258 (H) 06/21/2018   TRIG 185 (H) 06/21/2018   HDL 50 06/21/2018   CHOLHDL 5.2 06/21/2018   VLDL 37 06/21/2018   LDLCALC 171 (H) 06/21/2018   LDLCALC 172 (H) 03/13/2018    Physical Findings: AIMS:  , ,  ,  ,    CIWA:    COWS:     Musculoskeletal: Strength & Muscle Tone: within normal  limits Gait & Station: normal Patient leans: N/A  Psychiatric Specialty Exam: Physical Exam  Vitals reviewed. Constitutional: She appears well-developed.  Musculoskeletal: Normal range of motion.  Psychiatric: She has a normal mood and affect. Her behavior is normal.    Review of Systems  Psychiatric/Behavioral: Positive for depression and suicidal ideas. The patient is nervous/anxious.   All other systems reviewed and are negative.   Blood pressure (!) 129/106, pulse (!) 118, temperature 97.7 F (36.5 C), resp. rate 20, height 5\' 8"  (1.727 m), weight 104.8 kg, last menstrual period 03/19/2011, SpO2 97 %.Body mass index is 35.12 kg/m.  General Appearance: Casual  Eye Contact:  Good  Speech:  Clear and Coherent  Volume:  Normal  Mood:  Anxious and Depressed  Affect:  Congruent  Thought Process:  Coherent  Orientation:  Full (Time, Place, and Person)  Thought Content:  Hallucinations: Auditory Visual  Suicidal Thoughts:  Yes.  with intent/plan  Homicidal Thoughts:  No  Memory:  Immediate;   Fair Recent;   Fair  Judgement:  Fair  Insight:  Fair  Psychomotor Activity:  Normal  Concentration:  Concentration: Fair  Recall:  [employer] of Knowledge:  Fair  Language:  Fair  Akathisia:  No  Handed:  Right  AIMS (if indicated):     Assets:  Communication Skills Desire for Improvement Resilience Social Support  ADL's:  Intact  Cognition:  WNL  Sleep:  Number of Hours: 6.75     Treatment Plan Summary: Daily contact with patient to assess and evaluate symptoms and progress in treatment and Medication management   Continue with current medications on 10/31/2018 as listed below expected where noted.  Major Depression Disorder  Continue Prozac 40 mg po daily Continue Trazodone 50 mg QHS Continue Prazosin 1 mg nightly Continue Xanax 0.5 gm  PRN for anxious  Continue 1:1 for safety  CSW to continue working on discharge dispositions Patient was encouraged to  participated with day group sesisons      Derrill [place], NP 10/31/2018, 11:23 AM   Agree with NP progress note

## 2018-10-31 NOTE — Progress Notes (Signed)
Nursing note - Patient in bed asleep with eyes closed and respirations even and unlabored. No distress noted. 1:1 continues with sitter at bedside.

## 2018-10-31 NOTE — [facility] Group Notes (Signed)
[facility] Group Notes: (Clinical Social Work)   10/31/2018      Type of Therapy:  Group Therapy   Participation Level:  Did Not Attend - was invited both individually by MHT and by overhead announcement, chose not to attend.   Selmer Dominion, LCSW 10/31/2018, 12:30 PM

## 2018-10-31 NOTE — Progress Notes (Signed)
Patient ID: Andrea [name], female   DOB: 1976/03/11, 43 y.o.   MRN: [id number]   NOVEL CORONAVIRUS (COVID-19) DAILY CHECK-OFF SYMPTOMS - answer yes or no to each - every day NO YES  Have you had a fever in the past 24 hours?  . Fever (Temp > 37.80C / 100F) X   Have you had any of these symptoms in the past 24 hours? . New Cough .  Sore Throat  .  Shortness of Breath .  Difficulty Breathing .  Unexplained Body Aches   X   Have you had any one of these symptoms in the past 24 hours not related to allergies?   . Runny Nose .  Nasal Congestion .  Sneezing   X   If you have had runny nose, nasal congestion, sneezing in the past 24 hours, has it worsened?  X   EXPOSURES - check yes or no X   Have you traveled outside the state in the past 14 days?  X   Have you been in contact with someone with a confirmed diagnosis of COVID-19 or PUI in the past 14 days without wearing appropriate PPE?  X   Have you been living in the same home as a person with confirmed diagnosis of COVID-19 or a PUI (household contact)?    X   Have you been diagnosed with COVID-19?    X              What to do next: Answered NO to all: Answered YES to anything:   Proceed with unit schedule Follow the [facility] Inpatient Flowsheet.

## 2018-11-01 NOTE — [facility] Suicide Risk Assessment (Signed)
[facility] INPATIENT:  Family/Significant Other Suicide Prevention Education  Suicide Prevention Education:  Education Completed; Kelby Aline,  (father of the patient) has been identified by the patient as the family member/significant other with whom the patient will be residing, and identified as the person(s) who will aid the patient in the event of a mental health crisis (suicidal ideations/suicide attempt).  With written consent from the patient, the family member/significant other has been provided the following suicide prevention education, prior to the and/or following the discharge of the patient. Mr. Dilday expressed concern that if his daughter is released on 07-Dec-2022 the birthday of her deceased son she will hospitalized again. He stated that his daughter is expecting her deceased son to return and bring her with him on his birthday.  The suicide prevention education provided includes the following:  Suicide risk factors  Suicide prevention and interventions  National Suicide Hotline telephone number  [facility] assessment telephone number  [facility] Emergency Assistance [place] and/or Residential Mobile Crisis Unit telephone number  Request made of family/significant other to:  Remove weapons (e.g., guns, rifles, knives), all items previously/currently identified as safety concern.    Remove drugs/medications (over-the-counter, prescriptions, illicit drugs), all items previously/currently identified as a safety concern.  The family member/significant other verbalizes understanding of the suicide prevention education information provided.  The family member/significant other agrees to remove the items of safety concern listed above.  Rolanda Jay 11/01/2018, 4:37 PM

## 2018-11-01 NOTE — [facility] Group Notes (Signed)
[facility] LCSW Group Therapy Note  11/01/2018   10:00-11:00AM  Type of Therapy and Topic:  Group Therapy:  Unhealthy versus Healthy Supports, Which Am I?  Participation Level:  Active   Description of Group:  Patients in this group were introduced to the concept that additional supports including self-support are an essential part of recovery.  Initially a discussion was held about the differences between healthy versus unhealthy supports.  Patients were asked to share what unhealthy supports in their lives need to be addressed, as well as what additional healthy supports could be added for greater help in reaching their goals.   A song entitled "My Own Hero" was played and a group discussion ensued in which patients stated they could relate to the song and it inspired them to realize they have be willing to help themselves in order to succeed, because other people cannot achieve sobriety or stability for them.  We discussed adding a variety of healthy supports to address the various needs in patient lives, including becoming more self-supportive.  A song was played called "I Know Where I've Been" toward the end of group and used to conduct an inspirational wrap-up to group of remembering how far they have already come in their journey.  Therapeutic Goals: 1)  Highlight the differences between healthy and unhealthy supports 2)  Suggest the importance of being a part of one's own support system 2)  Discuss reasons people in one's life may eventually be unable to be continually supportive  3)  Identify the patient's current support system   4) Elicit commitments to add healthy supports and to become more conscious of being self-supportive   Summary of Patient Progress:  The patient listed as healthy supports the following:  Father, treatment team, bereavement counselor.  The patient expressed that unhealthy supports to be addressed include a friend she has had to remove from her life.  Patient feels they  are a healthy (at times) and unhealthy (at times) support for themselves.  Healthy supports which could be added for increased stability and happiness include Comp[employer] participation.  Therapeutic Modalities:   Motivational Interviewing Activity  Maretta Los , MSW, LCSW

## 2018-11-01 NOTE — Progress Notes (Signed)
D:Pt has been tearful this morning talking about the death of her son and his upcoming birthday. Pt says that she knows her thoughts are "irrational" but she continues to think that he is coming for her.  A:Supported pt to discuss feelings. Offered encouragement and 1:1 observation for safety.  R:Pt is currently attending group. Safety maintained on the unit.

## 2018-11-01 NOTE — Progress Notes (Signed)
1:1 Note Pt has been observed this in the dayroom talking to peers. Pt told the writer that her deceased son birthday is approaching and she feeling the need of going to be with her son. Pt is appreciative to what staff are doing to help her. Still endorsing passive SI but no plan. Pt is safe at this time in the unit and remains on 1:1 for safety, will continue to monitor.

## 2018-11-01 NOTE — Progress Notes (Signed)
Patient ID: Andrea [name], female   DOB: [birth year], 43 y.o.   MRN: [id number]  1:1 RN note  D: Pt is sitting in the dayroom watching television and talking to other pts. Pt reported a headache and was given medication, as recorded in the [facility]. Per pt,  "I was crying all morning about my son and now I have a headache." Kennon Holter is with pt. Will continue to monitor. A: 1:1 continues for safety. R: Pt remains safe on the unit.

## 2018-11-01 NOTE — Progress Notes (Signed)
1:1 Note,.  Pt is observed a sleep in her bed with even and unlabored respiration. Pt issues observed or reported at this time. Continue on 1:1 for safety, will continue to monitor.

## 2018-11-01 NOTE — Progress Notes (Signed)
Adult Psychoeducational Group Note  Date:  11/01/2018 Time:  9:22 PM  Group Topic/Focus:  Wrap-Up Group:   The focus of this group is to help patients review their daily goal of treatment and discuss progress on daily workbooks.  Participation Level:  Active  Participation Quality:  Appropriate and Attentive  Affect:  Appropriate  Cognitive:  Alert, Appropriate and Oriented  Insight: Appropriate  Engagement in Group:  Engaged  Modes of Intervention:  Discussion and Education  Additional Comments:  Pt attended and participated in group. Pt stated her goal today was to interact more with peers. Pt reported completing her goal and rated her day a 6/10.  Milus Glazier 11/01/2018, 9:22 PM

## 2018-11-01 NOTE — Progress Notes (Signed)
D    Pt has been sitting in the dayroom socializing and watching TV    She has voiced no complaints   Continues to have passive si  A  Pt is a 1:1 for safety   R   Safety maintained  [place] NOVEL CORONAVIRUS (COVID-19) DAILY CHECK-OFF SYMPTOMS - answer yes or no to each - every day NO YES  Have you had a fever in the past 24 hours?  . Fever (Temp > 37.80C / 100F) X   Have you had any of these symptoms in the past 24 hours? . New Cough .  Sore Throat  .  Shortness of Breath .  Difficulty Breathing .  Unexplained Body Aches   X   Have you had any one of these symptoms in the past 24 hours not related to allergies?   . Runny Nose .  Nasal Congestion .  Sneezing   X   If you have had runny nose, nasal congestion, sneezing in the past 24 hours, has it worsened?  X   EXPOSURES - check yes or no X   Have you traveled outside the state in the past 14 days?  X   Have you been in contact with someone with a confirmed diagnosis of COVID-19 or PUI in the past 14 days without wearing appropriate PPE?  X   Have you been living in the same home as a person with confirmed diagnosis of COVID-19 or a PUI (household contact)?    X   Have you been diagnosed with COVID-19?    X              What to do next: Answered NO to all: Answered YES to anything:   Proceed with unit schedule Follow the [facility] Inpatient Flowsheet.

## 2018-11-01 NOTE — Progress Notes (Signed)
Patient ID: Andrea [name], female   DOB: 05-07-1976, 43 y.o.   MRN: [id number]  1:1 RN note  D: Pt went off unit to the courtyard, per MD order. Pt stated that she felt "much better" after getting some sunlight and fresh air. Pt is in the dayroom engaging with other pts. Sitter is with pt. No distress or discomfort noted. A: 1:1 continues for safety. R: Pt remains safe on the unit.
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
## 2018-11-02 MED ORDER — ARIPIPRAZOLE 2 MG PO TABS
2.0000 mg | ORAL_TABLET | Freq: Every day | ORAL | Status: DC
  Administered 2018-11-03: 2 mg via ORAL
  Filled 2018-11-02 (×3): qty 1

## 2018-11-02 MED ORDER — HYDROCORTISONE 1 % EX CREA: TOPICAL_CREAM | Freq: Four times a day (QID) | CUTANEOUS | Status: DC | PRN

## 2018-11-02 MED ORDER — TRAZODONE HCL 50 MG PO TABS
50.0000 mg | ORAL_TABLET | Freq: Every evening | ORAL | Status: DC | PRN
  Administered 2018-11-02 – 2018-11-09 (×8): 50 mg via ORAL
  Filled 2018-11-02 (×8): qty 1

## 2018-11-02 NOTE — Progress Notes (Signed)
1:1 Note 1300  Patient has been talking to chaplain with 1:1 present.  Patient stated she is feeling better and feels she has goals to work toward.  Respirations even and unlabored.  No signs/symptoms of pain/distress noted on patient's face/body movements.  Safety maintained with 1:1 per MD order.

## 2018-11-02 NOTE — Progress Notes (Addendum)
Chaplain follow up.   Taeko reports that she is working on "reframing my irrational ideas."   Describes knowing the thought that her son is coming to get her is "irrational - but still believing this may happen."  Is attempting to direct her thoughts toward something positive.    She speaks with chaplain about her son's upcoming birthday on Saturday.  Is fearful of waking up that day.  Chaplain spoke with [name] and MHT about making a plan for the day - including safety plan for when [place] needs support.  Encouraged attentiveness to how she is feeling that day and enlisting others to support her prior to that morning.   [name] and chaplain spoke about using time at [facility] to reflect on a way she wishes to honor her son.  She wonders if it is appropriate for her to write a letter to her son.  Chaplain encouraged her to speak with MD and care team about her symptoms and whether they would recommend this at this time.

## 2018-11-02 NOTE — Progress Notes (Signed)
1:1Note Pt discussed her feelings regarding her belief that her deceased son would come get her on his birthday this coming Saturday   She requested to speak to the chaplain and same info was passed on to dayshift nurse   Pt continues to verbalize SI and cannot contract   Pt is on a 1:1 and is presently safe

## 2018-11-02 NOTE — Progress Notes (Signed)
1:1 Note 1600   Patient stated she has history of hemorrhoids and anal fissure.  That she has been going to the bathroom a lot and needs imodium and possibly preparation H would help her irritation and small amount of blood.  Stated she has history of IBS and had her gallbladder removed approximately 5 yrs ago.  Patient stated she continues to have SI thoughts, no plan, contracts for safety.  Denied HI.  Denied A/V hallucinations.  Respirations even and unlabored.  No signs/symptoms of pain/distress noted on patient's face/body movements.  Safety maintained with 1:1 per MD orders.
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
## 2018-11-02 NOTE — Plan of Care (Signed)
Nurse discussed anxiety, depression and coping skills with patient.  

## 2018-11-02 NOTE — Progress Notes (Signed)
1:1 Note  1030  Patient has been up in the dayroom this morning.  Continues to have SI thoughts off/on.  "I just want to die.  I miss him so much.  I was triggered earlier.  I'm OK.  I would like to go outside.  I think that would help me.  I just want to be with him.  I'm not hearing voices or seeing people."  Patient has been trying to stay out of her room.   Respirations even and unlabored.  No symptoms of pain/distress noted on patient's face/body movements.  1:1 continues for patient's safety.

## 2018-11-02 NOTE — Progress Notes (Signed)
1:1 note  Pt in bed resting with eyes closed   No distress noted   Pt on 1:1 for safety   Safety maintained

## 2018-11-02 NOTE — Progress Notes (Signed)
1:1 Note 0830   Patient stated she is expecting her son to come and get her by his birthday on Friday.  Patient has been in the dayroom, talking with peers and staff.  Patient came to medication window for her meds this morning.  Respirations even and unlabored.  No signs/symptoms of pain/distress noted on patient's face/body movements.  1:1 continues for safety per MD order.

## 2018-11-02 NOTE — Progress Notes (Signed)
1:1 note Pt reports crying all day and just being mentally exhausted    She said her meeting with the chaplain was helpful and they are working on a safety plan for her    Pt continues to be suicidal   She is a 1:1 for safety   Pt is currently safe

## 2018-11-02 NOTE — Progress Notes (Signed)
1:1 Note 1900  Patient sitting on her bed with 1:1 present with her for safety.  Patient stated she continually has SI thoughts.  That her deceased son's birthday is 08/26/22.  Patient stated she would not hurt herself while being a patient at [facility], but if she is discharged, she would commit suicide.  That she has written a safety plan to get through Aug 26, 2022, but she knows she would commit suicide if she was discharged.  That her dad is going to pay for her to have a babysitter on 08-26-22 for her safety if she is discharged from [facility].  She continues to say that her deceased son is going to come get her before his birthday.  MHT and peer support person have continually talked to her today.  Patient stated she would show the MD her journal about her suicidal thoughts if necessary.  That she has written a will in her journal.   Respirations even and unlabored.  No signs/symptoms of pain/distress noted on patient's face/body movements.

## 2018-11-03 ENCOUNTER — Ambulatory Visit (HOSPITAL_COMMUNITY): Payer: Medicare HMO | Admitting: Psychiatry

## 2018-11-03 MED ORDER — ARIPIPRAZOLE 5 MG PO TABS
5.0000 mg | ORAL_TABLET | Freq: Every day | ORAL | Status: DC
  Administered 2018-11-04 – 2018-11-05 (×2): 5 mg via ORAL
  Filled 2018-11-03 (×4): qty 1

## 2018-11-03 MED ORDER — LOPERAMIDE HCL 2 MG PO CAPS: 2.0000 mg | ORAL_CAPSULE | ORAL | Status: DC | PRN

## 2018-11-03 MED ORDER — ARIPIPRAZOLE 2 MG PO TABS
2.0000 mg | ORAL_TABLET | Freq: Once | ORAL | Status: AC
  Administered 2018-11-03: 2 mg via ORAL
  Filled 2018-11-03: qty 1

## 2018-11-03 NOTE — Progress Notes (Signed)
Patient declines a referral to [facility] (not covered by her insurance, but her father has offered to private pay).  Patient continues to ruminate on the possibility of being referred to [facility]. She states she does not need longer term residential treatment. She states she wants to be here, in a safe place, on the day of her son's birthday (06/20). She states she will write a letter to her deceased son.  Stephanie Acre, LCSW-A Clinical Social Worker

## 2018-11-03 NOTE — Plan of Care (Signed)
D: Patient is alert, oriented, delusional, and cooperative. Endorses passive SI. Denies HI, AVH, and verbally contracts for safety. Patient reports she feels less emotional than yesterday. She thinks her deceased son is going to "come get me on Saturday morning to be with him". She reports knowing this isn't rational.    A: Medications administered per MD order. Support provided. Patient educated on safety on the unit and medications. Routine safety checks every 15 minutes. Patient stated understanding to tell nurse about any new physical symptoms. Patient understands to tell staff of any needs.     R: No adverse drug reactions noted. Patient verbally contracts for safety. Patient remains safe at this time and will continue to monitor.   Problem: Safety: Goal: Periods of time without injury will increase Outcome: Progressing   Patient remains safe and will continue to monitor.   [place] NOVEL CORONAVIRUS (COVID-19) DAILY CHECK-OFF SYMPTOMS - answer yes or no to each - every day NO YES  Have you had a fever in the past 24 hours?  Fever (Temp > 37.80C / 100F) X   Have you had any of these symptoms in the past 24 hours? New Cough  Sore Throat   Shortness of Breath  Difficulty Breathing  Unexplained Body Aches   X   Have you had any one of these symptoms in the past 24 hours not related to allergies?   Runny Nose  Nasal Congestion  Sneezing   X   If you have had runny nose, nasal congestion, sneezing in the past 24 hours, has it worsened?  X   EXPOSURES - check yes or no X   Have you traveled outside the state in the past 14 days?  X   Have you been in contact with someone with a confirmed diagnosis of COVID-19 or PUI in the past 14 days without wearing appropriate PPE?  X   Have you been living in the same home as a person with confirmed diagnosis of COVID-19 or a PUI (household contact)?    X   Have you been diagnosed with COVID-19?    X              What to do next:  Answered NO to all: Answered YES to anything:   Proceed with unit schedule Follow the [facility] Inpatient Flowsheet.

## 2018-11-03 NOTE — Progress Notes (Signed)
Patient ID: Andrea [name], female   DOB: 1975-09-12, 43 y.o.   MRN: [id number]  1:1 note: Patient continues on 1:1 for safety. Patient in no signs of distress. No complaints. 1:1 observation continues for safety.

## 2018-11-03 NOTE — Progress Notes (Addendum)
Spiritual care group on grief and loss facilitated by chaplain Jerene Pitch  Group Goal:  Support / Education around grief and loss Members engage in facilitated group support and psycho social education.  Group Description:  Following introductions and group rules,  Group members engaged in facilitated group dialog and support around topic of loss, with particular support around experiences of loss in their lives. Group Identified types of loss (relationships / self / things) and identified patterns, circumstances, and changes that precipitate losses. Reflected on thoughts / feelings around loss, normalized grief responses, and recognized variety in grief experience. Patient Progress:

## 2018-11-03 NOTE — Progress Notes (Signed)
Patient ID: Andrea [name], female   DOB: 13-Dec-1975, 43 y.o.   MRN: [id number] 1:1 Patient is in her room talking to 1:1 sitter. No signs of distress. Patient continues on 1:1 for safety. Patient is safe on the unit.
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
## 2018-11-03 NOTE — Progress Notes (Signed)
Patient ID: Andrea [name], female   DOB: 06/04/1975, 43 y.o.   MRN: [id number]  [place] NOVEL CORONAVIRUS (COVID-19) DAILY CHECK-OFF SYMPTOMS - answer yes or no to each - every day NO YES  Have you had a fever in the past 24 hours?  . Fever (Temp > 37.80C / 100F) X   Have you had any of these symptoms in the past 24 hours? . New Cough .  Sore Throat  .  Shortness of Breath .  Difficulty Breathing .  Unexplained Body Aches   X   Have you had any one of these symptoms in the past 24 hours not related to allergies?   . Runny Nose .  Nasal Congestion .  Sneezing   X   If you have had runny nose, nasal congestion, sneezing in the past 24 hours, has it worsened?  X   EXPOSURES - check yes or no X   Have you traveled outside the state in the past 14 days?  X   Have you been in contact with someone with a confirmed diagnosis of COVID-19 or PUI in the past 14 days without wearing appropriate PPE?  X   Have you been living in the same home as a person with confirmed diagnosis of COVID-19 or a PUI (household contact)?    X   Have you been diagnosed with COVID-19?    X              What to do next: Answered NO to all: Answered YES to anything:   Proceed with unit schedule Follow the [facility] Inpatient Flowsheet.

## 2018-11-03 NOTE — Progress Notes (Signed)
Patient ID: Andrea [name], female   DOB: 1976-02-27, 43 y.o.   MRN: [id number]  1:1 Patient continues on 1:1 for safety. Patient in no signs of distress. Patient is safe on the unit.

## 2018-11-03 NOTE — Progress Notes (Signed)
1:1 note   No change from previous assessment   Pt on 1:1 for safety   Safety maintained

## 2018-11-03 NOTE — Progress Notes (Signed)
The focus of this group is to help patients establish daily goals to achieve during treatment and discuss how the patient can incorporate goal setting into their daily lives to aide in recovery. 

## 2018-11-03 NOTE — Progress Notes (Signed)
1:1 note   Pt resting in bed  No distress noted  No complaints   Pt is on 1:1 for safety    Safety maintained

## 2018-11-03 NOTE — Progress Notes (Signed)
The patient's event for the day is that she only cried a few times versus throughout the day. She states that she is feeling better overall. Her goal for tomorrow is to prepare herself for the anniversary of her son's passing and to begin to write him a letter as a sign of closure.

## 2018-11-04 NOTE — Progress Notes (Signed)
1:1 note  Pt presented to the medication window; compliant with medications. Pt is sullen, depressed, and speaking of her delusions that she knows aren't real. Pt states she just wants to get through Saturday; her sons birthday. Pt denies any physical pain. Pt knows she must use her resources when she leaves. Pt denies si/hi/ah/vh and verbally agrees to approach staff if these become apparent or before harming herself/others while at [facility]. Pt provided support and encouragement. Pt given medication per protocol and standing orders. q67m safety checks implemented and continued. Pt safe on the unit. Will continue to monitor.

## 2018-11-04 NOTE — Progress Notes (Signed)
1:1 note:  Patient is seen in dayroom talking. Patient is alert. No distress noted. Patient remains safe. 1:1 continued.

## 2018-11-04 NOTE — Progress Notes (Signed)
1:1 note  Pt has been visible in the dayroom. Pt has also practiced self care today visible by her brushing her hair in her room. Pt denies any physical symptoms still. Pt is complaining of symptoms that she requests Preparation H for. Pt states her hydrocortisone cream doesn't improve her condition. Pt denies si/hi/ah/vh and verbally agrees to approach staff if these become apparent or before harming herself/others while at [facility]. Pt safe on the unit. q31m safety checks implemented and continued. Will continue to monitor.

## 2018-11-04 NOTE — Progress Notes (Signed)
1:1 note:  Patient observed sleeping MHT in arms reach. Respirations even and unlabored, no distress noted. Patient remains safe will continue to monitor.

## 2018-11-04 NOTE — Progress Notes (Signed)
[facility] MD Progress Note  11/04/2018 12:43 PM Andrea [name]  MRN:  [id number] Subjective:  "I'm a little better."  Andrea [name] found sitting in bed with 1:1 sitter at bedside. She reports some improvement in mood and states she is working on reframing suicidal thoughts. Abilify was increased yesterday, and she believes this medication has been helpful. She states that she realizes thoughts about son's return are irrational, but she continues to ruminate about son returning on Saturday (his birthday) and taking her life overnight and states "I don't know what I'll do on Saturday." She had been keeping a "suicide journal" at home for the last several weeks Denies AVH. She does report feeling triggered when self-harm was discussed in one of the group sessions earlier, and had increased urges to find something to cut herself for the next two hours. Denies thoughts of self-harm at this time. Denies SI. She identifies follow-up options for treatment through hospice bereavement program and returning to her therapist, but states "I just don't know what will happen on Saturday." Support and encouragement provided.  From admission H&P: 43 year old female, known to our unit from prior admission in February 2020. Her 69 year old son committed suicide ( January 30th/2020) after which she was admitted to psychiatric unit and then participated in [facility]. States "for a while  I was doing OK, reaching out to people". States " then all of a sudden June came up and it just hit me, because his birthday is on the 20th".  She also states that her surviving son, who is 107, " does not want to talk to me". Reports she developed suicidal ideations , with thoughts of killing herself " specifically on June 19th, because I want to be with him for his birthday".  States she was feeling that her son" would come get me , so I could join him".   Principal Problem: MDD (major depressive disorder), recurrent severe, without psychosis  ([place]) Diagnosis: Principal Problem:   MDD (major depressive disorder), recurrent severe, without psychosis ([place])  Total Time spent with patient: 30 minutes  Past Psychiatric History: See admission H&P  Past Medical History:  Past Medical History:  Diagnosis Date  . Anxiety   . Arthritis    ruptured lumbar disc-careful with positioning  . Blood dyscrasia    protein s deficiency-no treatment since 2006  . Depression   . GERD (gastroesophageal reflux disease)   . Headache(784.0)   . Hemorrhoids   . High cholesterol   . Hyperlipemia   . Hypertension   . Hypothyroidism   . IBS (irritable bowel syndrome)   . MVP (mitral valve prolapse)    echo per dr Dagmar Hait  . Protein S deficiency ([place]) 2003   dvt/pulmonary embolus-on coumadin for 6 months then off-Sees [facility]  . Pulmonary embolism ([place])   . PVC (premature ventricular contraction)     Past Surgical History:  Procedure Laterality Date  . CESAREAN SECTION  2006  . CHOLECYSTECTOMY N/A 11/03/2014   Procedure: LAPAROSCOPIC CHOLECYSTECTOMY WITH INTRAOPERATIVE CHOLANGIOGRAM;  Surgeon: Georganna Skeans, MD;  Location: [place];  Service: General;  Laterality: N/A;  . LAPAROSCOPIC ASSISTED VAGINAL HYSTERECTOMY  03/27/2011   Procedure: LAPAROSCOPIC ASSISTED VAGINAL HYSTERECTOMY;  Surgeon: Cyril Mourning, MD;  Location: [facility];  Service: Gynecology;  Laterality: N/A;  . SALPINGOOPHORECTOMY  03/27/2011   Procedure: SALPINGO OOPHERECTOMY;  Surgeon: Cyril Mourning, MD;  Location: [facility];  Service: Gynecology;  Laterality: Bilateral;  . TONSILLECTOMY  92  .  vaginal reconstructive surgery  2001   Family History:  Family History  Problem Relation Age of Onset  . High blood pressure Mother   . Anxiety disorder Mother   . Depression Mother   . OCD Mother   . Physical abuse Mother   . Hyperlipidemia Father        fathers side of family  . High blood pressure Father   . ADD / ADHD Son   . Seizures Son   . ADD / ADHD Son   .  Hypertension Other        entire family on both sides  . Asthma Son   . Asthma Son   . Clotting disorder Maternal Uncle   . Clotting disorder Paternal Grandmother   . Heart disease Maternal Grandfather   . Alcohol abuse Maternal Grandfather   . Anxiety disorder Maternal Grandmother    Family Psychiatric  History: See admission H&P Social History:  Social History   Substance and Sexual Activity  Alcohol Use Never  . Frequency: Never     Social History   Substance and Sexual Activity  Drug Use No    Social History   Socioeconomic History  . Marital status: Divorced    Spouse name: Not on file  . Number of children: 2  . Years of education: Not on file  . Highest education level: Not on file  Occupational History  . Occupation: nutrition    Employer: [employer]  Social Needs  . Financial resource strain: Not hard at all  . Food insecurity    Worry: Never true    Inability: Never true  . Transportation needs    Medical: No    Non-medical: No  Tobacco Use  . Smoking status: Former Smoker    Packs/day: 1.00    Years: 15.00    Pack years: 15.00    Types: Cigarettes, E-cigarettes    Quit date: 03/18/2004    Years since quitting: 14.6  . Smokeless tobacco: Never Used  . Tobacco comment: States she vaps several times a weeks but no nicotine in the ones she uses  Substance and Sexual Activity  . Alcohol use: Never    Frequency: Never  . Drug use: No  . Sexual activity: Not Currently  Lifestyle  . Physical activity    Days per week: 3 days    Minutes per session: 30 min  . Stress: To some extent  Relationships  . Social connections    Talks on phone: More than three times a week    Gets together: More than three times a week    Attends religious service: Never    Active member of club or organization: No    Attends meetings of clubs or organizations: Never    Relationship status: Divorced  Other Topics Concern  . Not on file  Social History  Narrative   Diet: Regular   Caffeine: Yes   Married- divorced, married in 2006   House: Yes, 2 persons   Pets: 1 dog   Current/Past profession: [employer] 2002-2013   Exercise: Yes, walking   Living Will: No    DNR: No    POA/HPOA: No       Additional Social History:                         Sleep: Good  Appetite:  Good  Current Medications: Current Facility-Administered Medications  Medication Dose Route Frequency Provider Last Rate Last  Dose  . acetaminophen (TYLENOL) tablet 650 mg  650 mg Oral Q6H PRN Ethelene Hal, NP   650 mg at 11/03/18 2104  . ALPRAZolam (XANAX XR) 24 hr tablet 2 mg  2 mg Oral Daily Cobos, Myer Peer, MD   2 mg at 11/04/18 0748  . ALPRAZolam Duanne Moron) tablet 0.5 mg  0.5 mg Oral Q6H PRN Cobos, Myer Peer, MD   0.5 mg at 11/03/18 2103  . alum & mag hydroxide-simeth (MAALOX/MYLANTA) 200-200-20 MG/5ML suspension 30 mL  30 mL Oral Q4H PRN Ethelene Hal, NP      . ARIPiprazole (ABILIFY) tablet 5 mg  5 mg Oral Daily Sharma Covert, MD   5 mg at 11/04/18 0748  . aspirin EC tablet 81 mg  81 mg Oral Daily Ethelene Hal, NP   81 mg at 11/04/18 0749  . cholecalciferol (VITAMIN D3) tablet 1,000 Units  1,000 Units Oral Daily Ethelene Hal, NP   1,000 Units at 11/04/18 0749  . colestipol (COLESTID) tablet 2 g  2 g Oral BID Lindon Romp A, NP   2 g at 11/04/18 0749  . FLUoxetine (PROZAC) capsule 40 mg  40 mg Oral Daily Cobos, Myer Peer, MD   40 mg at 11/04/18 0749  . hydrocortisone cream 1 %   Topical QID PRN Cobos, Myer Peer, MD      . levothyroxine (SYNTHROID) tablet 125 mcg  125 mcg Oral Q0600 Ethelene Hal, NP   125 mcg at 11/04/18 [phone]  . loperamide (IMODIUM) capsule 2 mg  2 mg Oral PRN Sharma Covert, MD      . magnesium hydroxide (MILK OF MAGNESIA) suspension 30 mL  30 mL Oral Daily PRN Ethelene Hal, NP      . metoprolol tartrate (LOPRESSOR) tablet 25 mg  25 mg Oral BID Ethelene Hal, NP   25  mg at 11/04/18 0749  . pantoprazole (PROTONIX) EC tablet 80 mg  80 mg Oral Q1200 Ethelene Hal, NP   80 mg at 11/04/18 1208  . prazosin (MINIPRESS) capsule 1 mg  1 mg Oral QHS Ethelene Hal, NP   1 mg at 11/03/18 2103  . rosuvastatin (CRESTOR) tablet 10 mg  10 mg Oral Daily Ethelene Hal, NP   10 mg at 11/04/18 0749  . traZODone (DESYREL) tablet 50 mg  50 mg Oral QHS PRN Cobos, Myer Peer, MD   50 mg at 11/03/18 2104    Lab Results: No results found for this or any previous visit (from the past 9 hour(s)).  Blood Alcohol level:  Lab Results  Component Value Date   ETH <10 99/37/1696    Metabolic Disorder Labs: Lab Results  Component Value Date   HGBA1C 5.6 06/21/2018   MPG 114.02 06/21/2018   MPG 114 06/13/2017   No results found for: PROLACTIN Lab Results  Component Value Date   CHOL 258 (H) 06/21/2018   TRIG 185 (H) 06/21/2018   HDL 50 06/21/2018   CHOLHDL 5.2 06/21/2018   VLDL 37 06/21/2018   LDLCALC 171 (H) 06/21/2018   LDLCALC 172 (H) 03/13/2018    Physical Findings: AIMS: Facial and Oral Movements Muscles of Facial Expression: None, normal Lips and Perioral Area: None, normal Jaw: None, normal Tongue: None, normal,Extremity Movements Upper (arms, wrists, hands, fingers): None, normal Lower (legs, knees, ankles, toes): None, normal, Trunk Movements Neck, shoulders, hips: None, normal, Overall Severity Severity of abnormal movements (highest score from questions above): None, normal Incapacitation  due to abnormal movements: None, normal Patient's awareness of abnormal movements (rate only patient's report): No Awareness, Dental Status Current problems with teeth and/or dentures?: No Does patient usually wear dentures?: No  CIWA:  CIWA-Ar Total: 1 COWS:  COWS Total Score: 3  Musculoskeletal: Strength & Muscle Tone: within normal limits Gait & Station: normal Patient leans: N/A  Psychiatric Specialty Exam: Physical Exam  Nursing  note and vitals reviewed. Constitutional: She is oriented to person, place, and time. She appears well-developed and well-nourished.  Cardiovascular: Normal rate.  Respiratory: Effort normal.  Neurological: She is alert and oriented to person, place, and time.    Review of Systems  Constitutional: Negative.   Psychiatric/Behavioral: Positive for depression. Negative for hallucinations, substance abuse and suicidal ideas. The patient is not nervous/anxious and does not have insomnia.     Blood pressure 132/83, pulse 61, temperature 98 F (36.7 C), temperature source Oral, resp. rate 18, height 5\' 8"  (1.727 m), weight 104.8 kg, last menstrual period 03/19/2011, SpO2 100 %.Body mass index is 35.12 kg/m.  General Appearance: Casual  Eye Contact:  Good  Speech:  Clear and Coherent  Volume:  Normal  Mood:  Anxious  Affect:  Congruent  Thought Process:  Coherent  Orientation:  Full (Time, Place, and Person)  Thought Content:  Rumination  Suicidal Thoughts:  No  Homicidal Thoughts:  No  Memory:  Immediate;   Fair Recent;   Fair  Judgement:  Fair  Insight:  Fair  Psychomotor Activity:  Normal  Concentration:  Concentration: Fair and Attention Span: Fair  Recall:  [employer] of Knowledge:  Fair  Language:  Good  Akathisia:  No  Handed:  Right  AIMS (if indicated):     Assets:  Communication Skills Housing Social Support  ADL's:  Intact  Cognition:  WNL  Sleep:  Number of Hours: 6.25     Treatment Plan Summary: Daily contact with patient to assess and evaluate symptoms and progress in treatment and Medication management   Continue inpatient hospitalization.  Continue Xanax XR 2 mg PO daily for anxiety Continue Xanax 0.5 mg PO Q6HR PRN anxiety Continue Abilify 5 mg PO daily for mood Continue ASA 81 mg PO daily for antiplatelet Continue Prozac 40 mg PO daily for mood Continue Synthroid 125 mcg PO daily for hypothyroidism Continue Lopressor 25 mg PO BID for HTN Continue  Protonix 80 mg PO daily for GERD Continue Minipress 1 mg PO QHS for nightmares Continue Crestor 10 mg PO daily for HLD Continue trazodone 50 mg PO QSH PRN insomnia  Patient will participate in the therapeutic group milieu.  Discharge disposition in progress.   Connye Burkitt, NP 11/04/2018, 12:43 PM

## 2018-11-04 NOTE — Progress Notes (Signed)
1:1 note  Pt found in the dayroom most of the day interacting with peers. Pt continues to have hope but is still anxious about her sons birthday Saturday. Pt denies any suicidal thoughts. Pt is with sitter. Pt safe on the unit. q69m safety checks implemented and continued. Will continue to monitor. 1:1 continues

## 2018-11-04 NOTE — Progress Notes (Signed)
Patient attended the evening group session and answered all discussion questions prompted from this Probation officer. Patient shared their goal was to attend all groups and be more social. Patient rated her day a 7 out of 10 and her affect was appropriate.

## 2018-11-04 NOTE — [facility] Group Notes (Signed)
Occupational Therapy Group Note  Date:  11/04/2018 Time:  12:04 PM  Group Topic/Focus:  Self Esteem Action Plan:   The focus of this group is to help patients create a plan to continue to build self-esteem after discharge.  Participation Level:  Active  Participation Quality:  Appropriate  Affect:  Blunted  Cognitive:  Appropriate  Insight: Improving  Engagement in Group:  Engaged  Modes of Intervention:  Activity, Discussion, Education and Socialization  Additional Comments:    S: "This is a hard activity"  O: OT tx with focus on self esteem building this date. Education given on definition of self esteem, with both causes of low and high self esteem identified. Activity given for pt to identify a positive/aspiring trait for each letter of the alphabet. Pt to work with peers to help complete activity and build positive thinking.   A: Pt presents to group with blunted affect, engaged and participatory throughout session. Pt is known to this Probation officer from previous inpatient and PHP admission. She is quick to share all details surrounding her SI and self harm to OT. Pt engaged in group stating that the activity was difficult. She shares how pets and positive people help increase her self esteem. Pt completed A-Z activity at 50% completion and was willing to share findings. Encouraged pt to finish activity when feeling able to do so.  P: OT group will be x1 per week while pt inpatient  Zenovia Jarred, MSOT, OTR/L [place] Office: [place] 11/04/2018, 12:04 PM

## 2018-11-05 MED ORDER — ARIPIPRAZOLE 10 MG PO TABS
10.0000 mg | ORAL_TABLET | Freq: Every day | ORAL | Status: DC
  Administered 2018-11-06 – 2018-11-08 (×3): 10 mg via ORAL
  Filled 2018-11-05 (×5): qty 1

## 2018-11-05 MED ORDER — METOPROLOL TARTRATE 50 MG PO TABS
50.0000 mg | ORAL_TABLET | Freq: Two times a day (BID) | ORAL | Status: DC
  Administered 2018-11-05 – 2018-11-09 (×8): 50 mg via ORAL
  Filled 2018-11-05 (×12): qty 1

## 2018-11-05 NOTE — Progress Notes (Signed)
1:1 note  Pt found in the hallway; compliant with medication administration. Pt denies any physical problems or pain. Pt states she had a nightmare that startled her awake and she requested medication for this. Pt stated she couldn't get back to sleep after. Pt states she was trying to save her dead son and couldn't. Pt states she knows it was a dream, but is still anxious about the son's birthday Saturday. Pt was placed on a no roommate in anticipation. Pt provided support and encouragement. Pt given medicaiton per protocol and standing orders. q32m safety checks implemented and continued. Pt safe on the unit. Will continue to monitor. 1:1 continues.

## 2018-11-05 NOTE — Progress Notes (Signed)
Patient ID: Andrea [name], female   DOB: 1975-05-24, 43 y.o.   MRN: [id number] 1:1 staff remains at bedside for safety purposes.  Pt is currently in bed sleeping with no signs of distress, will continue to monitor.

## 2018-11-05 NOTE — Plan of Care (Addendum)
1:1 obs maintained for safety D: Patient has been observed in the dayroom interacting. She is calm. Patient is alert, anxious, and cooperative. Endorses passive SI. States "I always have passive suicidal ideations". Denies HI, AVH, and verbally contracts for safety. Patient reports she had a troubling dream this morning and it took a couple hours but she was able to put a positive spin on it. She has plans to write her son a letter on his birthday and burn her "suicide journal" when she gets home. She also reports she has asked her father to remove the razors from her room in an effort to be accountable in her efforts not to self harm anymore. Patient denies physical symptoms/pain.    A: Medications administered per MD order. Support provided. Patient educated on safety on the unit and medications. Routine safety checks every 15 minutes. Patient stated understanding to tell nurse about any new physical symptoms. Patient understands to tell staff of any needs.     R: No adverse drug reactions noted. Patient verbally contracts for safety. Patient remains safe at this time and will continue to monitor.   Problem: Coping: Goal: Coping ability will improve Outcome: Progressing   Problem: Safety: Goal: Periods of time without injury will increase Outcome: Progressing   Patient found something positive about the troubling dream she had last night. Patient and will continue to monitor.   [place] NOVEL CORONAVIRUS (COVID-19) DAILY CHECK-OFF SYMPTOMS - answer yes or no to each - every day NO YES  Have you had a fever in the past 24 hours?  . Fever (Temp > 37.80C / 100F) X   Have you had any of these symptoms in the past 24 hours? . New Cough .  Sore Throat  .  Shortness of Breath .  Difficulty Breathing .  Unexplained Body Aches   X   Have you had any one of these symptoms in the past 24 hours not related to allergies?   . Runny Nose .  Nasal Congestion .  Sneezing   X   If you have had  runny nose, nasal congestion, sneezing in the past 24 hours, has it worsened?  X   EXPOSURES - check yes or no X   Have you traveled outside the state in the past 14 days?  X   Have you been in contact with someone with a confirmed diagnosis of COVID-19 or PUI in the past 14 days without wearing appropriate PPE?  X   Have you been living in the same home as a person with confirmed diagnosis of COVID-19 or a PUI (household contact)?    X   Have you been diagnosed with COVID-19?    X              What to do next: Answered NO to all: Answered YES to anything:   Proceed with unit schedule Follow the [facility] Inpatient Flowsheet.

## 2018-11-05 NOTE — Progress Notes (Addendum)
[facility] Group Notes:  (Nursing/MHT/Case Management/Adjunct)  Date:  11/05/2018  Time:  2030  Type of Therapy:  wrap up group  Participation Level:  Active  Participation Quality:  Appropriate, Attentive, Sharing and Supportive  Affect:  Appropriate and Depressed  Cognitive:  Appropriate  Insight:  Improving  Engagement in Group:  Engaged  Modes of Intervention:  Clarification, Education and Support  Summary of Progress/Problems: Pt shared that she had a nightmare early this morning about finding her son but it turned out to be helpful in her finding some peace in the midst of her grief. Pt saw her grandmother in her dream, to whom she told " take care of my baby".  Pt plans to focus on her mental health issues and recovery when she leaves. Pt is grateful for her "daddy" for who she attributes her being alive today. She shares that he is her only support and does live with her.   Shellia Cleverly 11/05/2018, 9:16 PM

## 2018-11-05 NOTE — Progress Notes (Signed)
[facility] MD Progress Note  11/05/2018 11:52 AM Andrea [name]  MRN:  [id number] Subjective:  Patient is a 43 year old female with a past psychiatric history significant for major depression, posttraumatic stress disorder and borderline personality disorder who was admitted on 11/02/2018 with worsening suicidal ideation.  Objective: Patient is seen and examined.  Patient is a 43 year old female with the above-stated past psychiatric history who is seen in follow-up.  She stated she felt as though the Abilify was helping her.  She did have some nightmares last night about her son coming to visit her.  She is still very concerned about tomorrow and whether or not she will "have him come for me".  She stated she feels as though her suicidal thoughts have decreased, and that she has sufficient outpatient support and multiple groups that she sees for therapy as well as support.  She denied any side effects to her current medications.  Her blood pressure is elevated today at 136/105, heart rate is normal.  She is afebrile.  She slept 6.5 hours last night.  Principal Problem: MDD (major depressive disorder), recurrent severe, without psychosis ([place]) Diagnosis: Principal Problem:   MDD (major depressive disorder), recurrent severe, without psychosis ([place])  Total Time spent with patient: 20 minutes  Past Psychiatric History: The admission H&P  Past Medical History:  Past Medical History:  Diagnosis Date  . Anxiety   . Arthritis    ruptured lumbar disc-careful with positioning  . Blood dyscrasia    protein s deficiency-no treatment since 2006  . Depression   . GERD (gastroesophageal reflux disease)   . Headache(784.0)   . Hemorrhoids   . High cholesterol   . Hyperlipemia   . Hypertension   . Hypothyroidism   . IBS (irritable bowel syndrome)   . MVP (mitral valve prolapse)    echo per dr Dagmar Hait  . Protein S deficiency ([place]) 2003   dvt/pulmonary embolus-on coumadin for 6 months then off-Sees  [facility]  . Pulmonary embolism ([place])   . PVC (premature ventricular contraction)     Past Surgical History:  Procedure Laterality Date  . CESAREAN SECTION  2006  . CHOLECYSTECTOMY N/A 11/03/2014   Procedure: LAPAROSCOPIC CHOLECYSTECTOMY WITH INTRAOPERATIVE CHOLANGIOGRAM;  Surgeon: Georganna Skeans, MD;  Location: [place];  Service: General;  Laterality: N/A;  . LAPAROSCOPIC ASSISTED VAGINAL HYSTERECTOMY  03/27/2011   Procedure: LAPAROSCOPIC ASSISTED VAGINAL HYSTERECTOMY;  Surgeon: Cyril Mourning, MD;  Location: [facility];  Service: Gynecology;  Laterality: N/A;  . SALPINGOOPHORECTOMY  03/27/2011   Procedure: SALPINGO OOPHERECTOMY;  Surgeon: Cyril Mourning, MD;  Location: [facility];  Service: Gynecology;  Laterality: Bilateral;  . TONSILLECTOMY  92  . vaginal reconstructive surgery  2001   Family History:  Family History  Problem Relation Age of Onset  . High blood pressure Mother   . Anxiety disorder Mother   . Depression Mother   . OCD Mother   . Physical abuse Mother   . Hyperlipidemia Father        fathers side of family  . High blood pressure Father   . ADD / ADHD Son   . Seizures Son   . ADD / ADHD Son   . Hypertension Other        entire family on both sides  . Asthma Son   . Asthma Son   . Clotting disorder Maternal Uncle   . Clotting disorder Paternal Grandmother   . Heart disease Maternal Grandfather   . Alcohol abuse Maternal Grandfather   .  Anxiety disorder Maternal Grandmother    Family Psychiatric  History: See admission H&P Social History:  Social History   Substance and Sexual Activity  Alcohol Use Never  . Frequency: Never     Social History   Substance and Sexual Activity  Drug Use No    Social History   Socioeconomic History  . Marital status: Divorced    Spouse name: Not on file  . Number of children: 2  . Years of education: Not on file  . Highest education level: Not on file  Occupational History  . Occupation: nutrition     Employer: [employer]  Social Needs  . Financial resource strain: Not hard at all  . Food insecurity    Worry: Never true    Inability: Never true  . Transportation needs    Medical: No    Non-medical: No  Tobacco Use  . Smoking status: Former Smoker    Packs/day: 1.00    Years: 15.00    Pack years: 15.00    Types: Cigarettes, E-cigarettes    Quit date: 03/18/2004    Years since quitting: 14.6  . Smokeless tobacco: Never Used  . Tobacco comment: States she vaps several times a weeks but no nicotine in the ones she uses  Substance and Sexual Activity  . Alcohol use: Never    Frequency: Never  . Drug use: No  . Sexual activity: Not Currently  Lifestyle  . Physical activity    Days per week: 3 days    Minutes per session: 30 min  . Stress: To some extent  Relationships  . Social connections    Talks on phone: More than three times a week    Gets together: More than three times a week    Attends religious service: Never    Active member of club or organization: No    Attends meetings of clubs or organizations: Never    Relationship status: Divorced  Other Topics Concern  . Not on file  Social History Narrative   Diet: Regular   Caffeine: Yes   Married- divorced, married in 2006   House: Yes, 2 persons   Pets: 1 dog   Current/Past profession: [employer] 2002-2013   Exercise: Yes, walking   Living Will: No    DNR: No    POA/HPOA: No       Additional Social History:                         Sleep: Good  Appetite:  Good  Current Medications: Current Facility-Administered Medications  Medication Dose Route Frequency Provider Last Rate Last Dose  . acetaminophen (TYLENOL) tablet 650 mg  650 mg Oral Q6H PRN Ethelene Hal, NP   650 mg at 11/03/18 2104  . ALPRAZolam (XANAX XR) 24 hr tablet 2 mg  2 mg Oral Daily Cobos, Myer Peer, MD   2 mg at 11/05/18 0753  . ALPRAZolam Duanne Moron) tablet 0.5 mg  0.5 mg Oral Q6H PRN Cobos, Myer Peer, MD    0.5 mg at 11/05/18 0523  . alum & mag hydroxide-simeth (MAALOX/MYLANTA) 200-200-20 MG/5ML suspension 30 mL  30 mL Oral Q4H PRN Ethelene Hal, NP      . ARIPiprazole (ABILIFY) tablet 5 mg  5 mg Oral Daily Sharma Covert, MD   5 mg at 11/05/18 0750  . aspirin EC tablet 81 mg  81 mg Oral Daily Ethelene Hal, NP   81 mg  at 11/05/18 0754  . cholecalciferol (VITAMIN D3) tablet 1,000 Units  1,000 Units Oral Daily Ethelene Hal, NP   1,000 Units at 11/05/18 0751  . colestipol (COLESTID) tablet 2 g  2 g Oral BID Lindon Romp A, NP   2 g at 11/05/18 0751  . FLUoxetine (PROZAC) capsule 40 mg  40 mg Oral Daily Cobos, Myer Peer, MD   40 mg at 11/05/18 0751  . hydrocortisone cream 1 %   Topical QID PRN Cobos, Myer Peer, MD      . levothyroxine (SYNTHROID) tablet 125 mcg  125 mcg Oral Q0600 Ethelene Hal, NP   125 mcg at 11/05/18 0523  . loperamide (IMODIUM) capsule 2 mg  2 mg Oral PRN Sharma Covert, MD      . magnesium hydroxide (MILK OF MAGNESIA) suspension 30 mL  30 mL Oral Daily PRN Ethelene Hal, NP      . metoprolol tartrate (LOPRESSOR) tablet 25 mg  25 mg Oral BID Ethelene Hal, NP   25 mg at 11/05/18 0750  . pantoprazole (PROTONIX) EC tablet 80 mg  80 mg Oral Q1200 Ethelene Hal, NP   80 mg at 11/04/18 1208  . prazosin (MINIPRESS) capsule 1 mg  1 mg Oral QHS Ethelene Hal, NP   1 mg at 11/04/18 2107  . rosuvastatin (CRESTOR) tablet 10 mg  10 mg Oral Daily Ethelene Hal, NP   10 mg at 11/05/18 0750  . traZODone (DESYREL) tablet 50 mg  50 mg Oral QHS PRN Cobos, Myer Peer, MD   50 mg at 11/04/18 2107    Lab Results: No results found for this or any previous visit (from the past 86 hour(s)).  Blood Alcohol level:  Lab Results  Component Value Date   ETH <10 33/35/4562    Metabolic Disorder Labs: Lab Results  Component Value Date   HGBA1C 5.6 06/21/2018   MPG 114.02 06/21/2018   MPG 114 06/13/2017   No results  found for: PROLACTIN Lab Results  Component Value Date   CHOL 258 (H) 06/21/2018   TRIG 185 (H) 06/21/2018   HDL 50 06/21/2018   CHOLHDL 5.2 06/21/2018   VLDL 37 06/21/2018   LDLCALC 171 (H) 06/21/2018   LDLCALC 172 (H) 03/13/2018    Physical Findings: AIMS: Facial and Oral Movements Muscles of Facial Expression: None, normal Lips and Perioral Area: None, normal Jaw: None, normal Tongue: None, normal,Extremity Movements Upper (arms, wrists, hands, fingers): None, normal Lower (legs, knees, ankles, toes): None, normal, Trunk Movements Neck, shoulders, hips: None, normal, Overall Severity Severity of abnormal movements (highest score from questions above): None, normal Incapacitation due to abnormal movements: None, normal Patient's awareness of abnormal movements (rate only patient's report): No Awareness, Dental Status Current problems with teeth and/or dentures?: No Does patient usually wear dentures?: No  CIWA:  CIWA-Ar Total: 1 COWS:  COWS Total Score: 3  Musculoskeletal: Strength & Muscle Tone: within normal limits Gait & Station: normal Patient leans: N/A  Psychiatric Specialty Exam: Physical Exam  Nursing note and vitals reviewed. Constitutional: She is oriented to person, place, and time. She appears well-developed and well-nourished.  HENT:  Head: Normocephalic and atraumatic.  Respiratory: Effort normal.  Neurological: She is alert and oriented to person, place, and time.    ROS  Blood pressure (!) 136/105, pulse 79, temperature 98 F (36.7 C), temperature source Oral, resp. rate 18, height 5\' 8"  (1.727 m), weight 104.8 kg, last menstrual period 03/19/2011, SpO2  100 %.Body mass index is 35.12 kg/m.  General Appearance: Casual  Eye Contact:  Good  Speech:  Normal Rate  Volume:  Normal  Mood:  Anxious  Affect:  Congruent  Thought Process:  Coherent and Descriptions of Associations: Intact  Orientation:  Full (Time, Place, and Person)  Thought Content:   Rumination  Suicidal Thoughts:  Yes.  without intent/plan  Homicidal Thoughts:  No  Memory:  Immediate;   Fair Recent;   Fair Remote;   Fair  Judgement:  Intact  Insight:  Lacking  Psychomotor Activity:  Increased  Concentration:  Concentration: Fair and Attention Span: Fair  Recall:  [employer] of Knowledge:  Fair  Language:  Good  Akathisia:  Negative  Handed:  Right  AIMS (if indicated):     Assets:  Desire for Improvement Resilience  ADL's:  Intact  Cognition:  WNL  Sleep:  Number of Hours: 6.5     Treatment Plan Summary: Daily contact with patient to assess and evaluate symptoms and progress in treatment, Medication management and Plan : Patient is seen and examined.  Patient is a 43 year old female with the above-stated past psychiatric history who is seen in follow-up.  Diagnosis: #1 major depression, recurrent, severe without psychosis, #2 posttraumatic stress disorder, #3 borderline personality disorder (with components of narcissism).  Patient is seen in follow-up.  She is less agitated and labile than she had been.  She seems to be improved with the Abilify.  She continues to fixate on tomorrow as being the birthday of her deceased son.  She stated she feels now like "he does not want me to go with him".  No change in her current medications.  Hopefully we will get through this weekend and gain some insight from that.  She is taking Lopressor 25 mg p.o. twice daily for hypertension, and because of her continued tachycardia I am going to increase that to 50 mg p.o. twice daily. 1.  Continue Xanax extended release 2 mg p.o. daily for anxiety. 2.  Continue Xanax short acting 0.5 mg p.o. every 6 hours as needed anxiety. 3.  Increase Abilify to 10 mg p.o. daily for mood stability. 4.  Continue fluoxetine 40 mg p.o. daily for mood and anxiety. 5.  Continue vitamin D3 at thousand units p.o. daily for nutritional supplementation. 6.  Continue colestipol 2 g p.o. twice daily for  diarrhea. 7.  Continue levothyroxine 125 mcg p.o. daily for hypothyroidism. 8.  Increase  Lopressor to 50 mg p.o. twice daily for hypertension. 9.  Continue pantoprazole 80 mg p.o. daily for gastric protection. 10.  Continue prazosin 1 mg p.o. nightly for nightmares and flashbacks from posttraumatic stress disorder. 11.  Continue Crestor 10 mg p.o. daily for hyperlipidemia. 12.  Continue trazodone 50 mg p.o. nightly as needed insomnia. 13.  Disposition planning-in progress.  Sharma Covert, MD 11/05/2018, 11:52 AM

## 2018-11-05 NOTE — Progress Notes (Addendum)
1:1 Note 1730  Patient has been visible on the unit today, sitting in dayroom, laughing and talking to staff/peers.  Patient has mentioned her son's birthday on Saturday several times.   Respirations even and unlabored.  No signs/symptoms of pain/distress noted on patient's face/body movements.  1:1 continues for patient's safety per MD order.

## 2018-11-05 NOTE — Progress Notes (Signed)
Patient ID: Andrea [name], female   DOB: 1976/01/22, 43 y.o.   MRN: [id number] D: Patient woke up at 0520, crying hysterically, stating that she saw her deceased son in a dream, and was trying to save him, and was telling her grandmother to take care of him. Pt stated that she had never had a dream like this one.  A: Pt medicated with 0.5mg  of Xanax for anxiety and agitation.  Pt being maintained on 1:1 staff monitoring for her safety.  R: Will continue to monitor.

## 2018-11-05 NOTE — Progress Notes (Signed)
Follow up for continued support.  Pt was with peer support.  Provided brief pastoral presence and support at bedside.  Andrea [name] is continuing to work on her safety plan for Saturday.  Reports feeling as though she is making progress.  Had a dream last night, which was shocking to her, but which is helping her re imagine her relationship with son.  Reports dreaming she was getting son down after his death and her grandmother appeared to her as a young person and took son.  Andrea [name] is able to imagine her son being cared for by her grandmother.  She appears increasingly future-oriented and speaks about engaging as a support person with LGBT community.    This chaplain will be out of hospital tomorrow and Saturday.  There is chaplain oncall at pager [phone]

## 2018-11-05 NOTE — [facility] Group Notes (Signed)
[facility] Mental Health Association Group Therapy 11/05/2018 2:44 PM  Type of Therapy: Mental Health Association Presentation  Participation Level: Active  Participation Quality: Attentive  Affect: Appropriate  Cognitive: Oriented  Insight: Developing/Improving  Engagement in Therapy: Engaged  Modes of Intervention: Discussion, Education and Socialization   Summary of Progress/Problems: [place] ([place]) Speaker came to talk about his personal journey with mental health. The pt processed ways by which to relate to the speaker. [place] speaker provided handouts and educational information pertaining to groups and services offered by the [facility]. Pt was engaged in speaker's presentation and was receptive to resources provided.   Stephanie Acre, MSW, [facility] 11/05/2018 2:44 PM

## 2018-11-05 NOTE — Progress Notes (Addendum)
1:1 Note 1345  Patient has been compliant with her medications today.  Patient has been attending groups.  1:1 present with patient for safety.  Patient walks in hallway, talking to peers and staff, laughing, smiling.  Respirations even and unlabored.  No signs/symptoms of pain/distress noted on patient's face/body movements.    Patient's self inventory sheet, patient sleeps good, sleep medication helpful.  Poor appetite, low energy level, poor concentration.  Rated depression and hopeless 4, anxiety 8.  Denied withdrawals.  Denied SI.  Denied physical problems.  Denied physical pain.  Goal is work on Chief Strategy Officer and short term goals, prepare for Saturday.  Communicate with staff about safety plan.  High anxiety.

## 2018-11-05 NOTE — Progress Notes (Signed)
Patient ID: Andrea [name], female   DOB: March 20, 1976, 43 y.o.   MRN: [id number] D: Patient being maintained on 1:1 staff monitoring for suicidal thoughts expressed recently, Pt currently denies SI/HI/AVH, and talked briefly about the events surrounding the reason for her admission. Pt mentioned that upcoming Saturday is the birthday of her her son who committed suicide recently. Pt mentioned that she will like to go on living because she wants "to wake up", on Saturday and write a letter to him. Pt is calm and cooperative, and states that her father is very supportive of her.  A: Pt given all meds as scheduled.  R: Will continue to maintain on 1:1 monitoring for safety. Will continue to monitor.

## 2018-11-05 NOTE — [facility] Group Notes (Signed)
Adult Psychoeducational Group Note  Date:  11/05/2018 Time:  11:16 AM  Group Topic/Focus:  Goals Group:   The focus of this group is to help patients establish daily goals to achieve during treatment and discuss how the patient can incorporate goal setting into their daily lives to aide in recovery.  Participation Level:  Active  Participation Quality:  Appropriate  Affect:  Appropriate  Cognitive:  Alert  Insight: Appropriate  Engagement in Group:  Engaged  Modes of Intervention:  Orientation  Additional Comments:  Pt goal for today is to work on Chief Strategy Officer and work on short term goals  Huel Cote 11/05/2018, 11:16 AM

## 2018-11-06 MED ORDER — ZIPRASIDONE MESYLATE 20 MG IM SOLR: 20.0000 mg | INTRAMUSCULAR | Status: DC | PRN

## 2018-11-06 MED ORDER — OLANZAPINE 10 MG PO TBDP: 10.0000 mg | ORAL_TABLET | Freq: Three times a day (TID) | ORAL | Status: DC | PRN

## 2018-11-06 MED ORDER — LORAZEPAM 1 MG PO TABS: 1.0000 mg | ORAL_TABLET | ORAL | Status: DC | PRN

## 2018-11-06 NOTE — Care Management (Addendum)
Dr. Mallie Darting informed CMA about Andrea [name] [facility])  and wanted CMA to see if facility accepts patient's insurance.   Andrea [name] accepts the insurance and currently has availability for patient.   CMA will notify LCSWA, Stephanie Acre and follow up if patients wants residential.    [facility] Management Assistant  Email:[email or web address] Office: [phone]

## 2018-11-06 NOTE — Tx Team (Signed)
Interdisciplinary Treatment and Diagnostic Plan Update  11/06/2018 Time of Session: 9:00AM Andrea [name] MRN: [id number]  Principal Diagnosis: MDD (major depressive disorder), recurrent severe, without psychosis ([place])  Secondary Diagnoses: Principal Problem:   MDD (major depressive disorder), recurrent severe, without psychosis ([place])   Current Medications:  Current Facility-Administered Medications  Medication Dose Route Frequency Provider Last Rate Last Dose  . acetaminophen (TYLENOL) tablet 650 mg  650 mg Oral Q6H PRN Ethelene Hal, NP   650 mg at 11/05/18 1659  . ALPRAZolam (XANAX XR) 24 hr tablet 2 mg  2 mg Oral Daily Cobos, Myer Peer, MD   2 mg at 11/06/18 0746  . ALPRAZolam Duanne Moron) tablet 0.5 mg  0.5 mg Oral Q6H PRN Cobos, Myer Peer, MD   0.5 mg at 11/05/18 2112  . alum & mag hydroxide-simeth (MAALOX/MYLANTA) 200-200-20 MG/5ML suspension 30 mL  30 mL Oral Q4H PRN Ethelene Hal, NP      . ARIPiprazole (ABILIFY) tablet 10 mg  10 mg Oral Daily Sharma Covert, MD   10 mg at 11/06/18 0747  . aspirin EC tablet 81 mg  81 mg Oral Daily Ethelene Hal, NP   81 mg at 11/06/18 0746  . cholecalciferol (VITAMIN D3) tablet 1,000 Units  1,000 Units Oral Daily Ethelene Hal, NP   1,000 Units at 11/06/18 0747  . colestipol (COLESTID) tablet 2 g  2 g Oral BID Lindon Romp A, NP   2 g at 11/06/18 0746  . FLUoxetine (PROZAC) capsule 40 mg  40 mg Oral Daily Cobos, Myer Peer, MD   40 mg at 11/06/18 0747  . hydrocortisone cream 1 %   Topical QID PRN Cobos, Myer Peer, MD      . levothyroxine (SYNTHROID) tablet 125 mcg  125 mcg Oral Q0600 Ethelene Hal, NP   125 mcg at 11/06/18 [phone]  . loperamide (IMODIUM) capsule 2 mg  2 mg Oral PRN Sharma Covert, MD      . magnesium hydroxide (MILK OF MAGNESIA) suspension 30 mL  30 mL Oral Daily PRN Ethelene Hal, NP      . metoprolol tartrate (LOPRESSOR) tablet 50 mg  50 mg Oral BID Sharma Covert, MD    50 mg at 11/06/18 0746  . pantoprazole (PROTONIX) EC tablet 80 mg  80 mg Oral Q1200 Ethelene Hal, NP   80 mg at 11/05/18 1217  . prazosin (MINIPRESS) capsule 1 mg  1 mg Oral QHS Ethelene Hal, NP   1 mg at 11/05/18 2112  . rosuvastatin (CRESTOR) tablet 10 mg  10 mg Oral Daily Ethelene Hal, NP   10 mg at 11/06/18 0746  . traZODone (DESYREL) tablet 50 mg  50 mg Oral QHS PRN Cobos, Myer Peer, MD   50 mg at 11/05/18 2112   PTA Medications: Medications Prior to Admission  Medication Sig Dispense Refill Last Dose  . ezetimibe (ZETIA) 10 MG tablet Take 10 mg by mouth daily.     Marland Kitchen ALPRAZolam (XANAX XR) 2 MG 24 hr tablet Take 1 tablet (2 mg total) by mouth daily. For anxiety 5 tablet 0   . ALPRAZolam (XANAX) 0.5 MG tablet Take 1 tablet (0.5 mg total) by mouth 2 (two) times daily as needed for anxiety. 10 tablet 0   . ALPRAZolam (XANAX) 1 MG tablet Take 1 tablet by mouth 2 (two) times a day.     Marland Kitchen aspirin EC 81 MG tablet Take 81 mg by mouth daily.     Marland Kitchen  colestipol (COLESTID) 1 g tablet TAKE 2 TABLETS(2 GRAMS) BY MOUTH TWICE DAILY 120 tablet 1   . esomeprazole (NEXIUM) 40 MG capsule Take 1 capsule (40 mg total) by mouth daily. For acid reflux 5 capsule 0   . FLUoxetine (PROZAC) 40 MG capsule Take 2 capsules (80 mg total) by mouth daily. For depression (Patient taking differently: Take 40 mg by mouth daily. For depression) 60 capsule 0   . metoprolol tartrate (LOPRESSOR) 25 MG tablet Take 1 tablet (25 mg total) by mouth 2 (two) times daily. For hypertension 5 tablet 0   . oxyCODONE-acetaminophen (PERCOCET/ROXICET) 5-325 MG tablet Take 1 tablet by mouth every 6 (six) hours as needed.     . prazosin (MINIPRESS) 1 MG capsule Take 2 mg by mouth 2 (two) times daily.      . rosuvastatin (CRESTOR) 20 MG tablet Take one tablet by mouth once daily: For high cholesterol 5 tablet 0   . SYNTHROID 125 MCG tablet TAKE 1 TABLET(125 MCG) BY MOUTH DAILY BEFORE BREAKFAST: For thyroid hormone  replacement 5 tablet 0   . traZODone (DESYREL) 100 MG tablet Take 2 tablets (200 mg total) by mouth at bedtime as needed for sleep. 30 tablet 0   . Vitamin D, Ergocalciferol, (DRISDOL) 1.25 MG (50000 UT) CAPS capsule TAKE 1 CAPSULE BY MOUTH EVERY 7 DAYS 12 capsule 0     Patient Stressors: Loss of Son to suicide Traumatic event  Patient Strengths: Ability for insight Average or above average intelligence Communication skills General fund of knowledge Physical Health Special hobby/interest Supportive family/friends  Treatment Modalities: Medication Management, Group therapy, Case management,  1 to 1 session with clinician, Psychoeducation, Recreational therapy.   Physician Treatment Plan for Primary Diagnosis: MDD (major depressive disorder), recurrent severe, without psychosis ([place]) Long Term Goal(s): Improvement in symptoms so as ready for discharge Improvement in symptoms so as ready for discharge   Short Term Goals: Ability to identify changes in lifestyle to reduce recurrence of condition will improve Ability to verbalize feelings will improve Ability to disclose and discuss suicidal ideas Ability to demonstrate self-control will improve Ability to identify and develop effective coping behaviors will improve Ability to maintain clinical measurements within normal limits will improve Ability to identify changes in lifestyle to reduce recurrence of condition will improve Ability to verbalize feelings will improve Ability to disclose and discuss suicidal ideas Ability to demonstrate self-control will improve Ability to identify and develop effective coping behaviors will improve Ability to maintain clinical measurements within normal limits will improve  Medication Management: Evaluate patient's response, side effects, and tolerance of medication regimen.  Therapeutic Interventions: 1 to 1 sessions, Unit Group sessions and Medication administration.  Evaluation of Outcomes:  Progressing  Physician Treatment Plan for Secondary Diagnosis: Principal Problem:   MDD (major depressive disorder), recurrent severe, without psychosis ([place])  Long Term Goal(s): Improvement in symptoms so as ready for discharge Improvement in symptoms so as ready for discharge   Short Term Goals: Ability to identify changes in lifestyle to reduce recurrence of condition will improve Ability to verbalize feelings will improve Ability to disclose and discuss suicidal ideas Ability to demonstrate self-control will improve Ability to identify and develop effective coping behaviors will improve Ability to maintain clinical measurements within normal limits will improve Ability to identify changes in lifestyle to reduce recurrence of condition will improve Ability to verbalize feelings will improve Ability to disclose and discuss suicidal ideas Ability to demonstrate self-control will improve Ability to identify and develop effective  coping behaviors will improve Ability to maintain clinical measurements within normal limits will improve     Medication Management: Evaluate patient's response, side effects, and tolerance of medication regimen.  Therapeutic Interventions: 1 to 1 sessions, Unit Group sessions and Medication administration.  Evaluation of Outcomes: Progressing   RN Treatment Plan for Primary Diagnosis: MDD (major depressive disorder), recurrent severe, without psychosis ([place]) Long Term Goal(s): Knowledge of disease and therapeutic regimen to maintain health will improve  Short Term Goals: Ability to demonstrate self-control, Ability to participate in decision making will improve, Ability to verbalize feelings will improve, Ability to disclose and discuss suicidal ideas and Ability to identify and develop effective coping behaviors will improve  Medication Management: RN will administer medications as ordered by provider, will assess and evaluate patient's response and provide  education to patient for prescribed medication. RN will report any adverse and/or side effects to prescribing provider.  Therapeutic Interventions: 1 on 1 counseling sessions, Psychoeducation, Medication administration, Evaluate responses to treatment, Monitor vital signs and CBGs as ordered, Perform/monitor CIWA, COWS, AIMS and Fall Risk screenings as ordered, Perform wound care treatments as ordered.  Evaluation of Outcomes: Progressing   LCSW Treatment Plan for Primary Diagnosis: MDD (major depressive disorder), recurrent severe, without psychosis ([place]) Long Term Goal(s): Safe transition to appropriate next level of care at discharge, Engage patient in therapeutic group addressing interpersonal concerns.  Short Term Goals: Engage patient in aftercare planning with referrals and resources, Increase emotional regulation, Identify triggers associated with mental health/substance abuse issues and Increase skills for wellness and recovery  Therapeutic Interventions: Assess for all discharge needs, 1 to 1 time with Social worker, Explore available resources and support systems, Assess for adequacy in community support network, Educate family and significant other(s) on suicide prevention, Complete Psychosocial Assessment, Interpersonal group therapy.  Evaluation of Outcomes: Progressing   Progress in Treatment: Attending groups: Yes. Participating in groups: Yes. Taking medication as prescribed: Yes. Toleration medication: Yes. Family/Significant other contact made: Yes, individual(s) contacted:  father Patient understands diagnosis: Yes. Discussing patient identified problems/goals with staff: Yes. Medical problems stabilized or resolved: Yes. Denies suicidal/homicidal ideation: No. Issues/concerns per patient self-inventory: No. Other:   New problem(s) identified: None   New Short Term/Long Term Goal(s): medication stabilization, elimination of SI thoughts, development of comprehensive  mental wellness plan.    Patient Goals:    Discharge Plan or Barriers: CSW will continue to follow and assess for appropriate referrals and possible discharge planning.   Reason for Continuation of Hospitalization: Anxiety Depression Medication stabilization Suicidal ideation  Estimated Length of Stay: 3-5 days  Attendees: Patient: 11/06/2018 11:20 AM  Physician: Dr. Neita Garnet, MD 11/06/2018 11:20 AM  Nursing: Sharl Ma.Viona Gilmore, RN 11/06/2018 11:20 AM  RN Care Manager: 11/06/2018 11:20 AM  Social Worker: Radonna Ricker, [place], [place] 11/06/2018 11:20 AM  Recreational Therapist:  11/06/2018 11:20 AM  Other:  11/06/2018 11:20 AM  Other:  11/06/2018 11:20 AM  Other: 11/06/2018 11:20 AM    Scribe for Treatment Team: Joellen Jersey, [place] 11/06/2018 11:20 AM

## 2018-11-06 NOTE — Progress Notes (Signed)
Pt sitting in the dayroom conversing with her peers. No inappropriate behaviors noted. Pt remains on 1:1 observation for safety.

## 2018-11-06 NOTE — Progress Notes (Signed)
The focus of this group is to help patients establish daily goals to achieve during treatment and discuss how the patient can incorporate goal setting into their daily lives to aide in recovery. 

## 2018-11-06 NOTE — Progress Notes (Addendum)
Per recommendation of Dr.Clary, CSW referred patient to [facility].  A referral packet was completed and faxed to Moise Boring, intake coordinator (phone: [phone], fax: [phone]).   Stephanie Acre, LCSW-A Clinical Social Worker

## 2018-11-06 NOTE — Progress Notes (Signed)
Patient ID: Andrea [name], female   DOB: 01-07-1976, 43 y.o.   MRN: [id number]   [place] NOVEL CORONAVIRUS (COVID-19) DAILY CHECK-OFF SYMPTOMS - answer yes or no to each - every day NO YES  Have you had a fever in the past 24 hours?  . Fever (Temp > 37.80C / 100F) X   Have you had any of these symptoms in the past 24 hours? . New Cough .  Sore Throat  .  Shortness of Breath .  Difficulty Breathing .  Unexplained Body Aches   X   Have you had any one of these symptoms in the past 24 hours not related to allergies?   . Runny Nose .  Nasal Congestion .  Sneezing   X   If you have had runny nose, nasal congestion, sneezing in the past 24 hours, has it worsened?  X   EXPOSURES - check yes or no X   Have you traveled outside the state in the past 14 days?  X   Have you been in contact with someone with a confirmed diagnosis of COVID-19 or PUI in the past 14 days without wearing appropriate PPE?  X   Have you been living in the same home as a person with confirmed diagnosis of COVID-19 or a PUI (household contact)?    X   Have you been diagnosed with COVID-19?    X              What to do next: Answered NO to all: Answered YES to anything:   Proceed with unit schedule Follow the [facility] Inpatient Flowsheet.

## 2018-11-06 NOTE — Progress Notes (Signed)
Patient ID: Andrea [name], female   DOB: [birth year], 43 y.o.   MRN: [id number]   1:1 RN note  D: Pt in hallway engaging with other pts and RN staff. Pt sitting in day room. Sitter is with pt. No distress or discomfort noted. A: 1:1 continues for safety. R: Pt remains safe on the unit.

## 2018-11-06 NOTE — Plan of Care (Signed)
  Problem: Coping: Goal: Coping ability will improve Outcome: Progressing   Problem: Medication: Goal: Compliance with prescribed medication regimen will improve Outcome: Progressing

## 2018-11-06 NOTE — Progress Notes (Signed)
1:1 note  Patient is observed sleeping. Respirations even and unlabored. No distress noted. MHT within arms reach. Patient remains safe. 1:1 continued for safety.

## 2018-11-06 NOTE — Progress Notes (Signed)
Recreation Therapy Notes  Date:  6.19.20 Time: 0930 Location: 300 Hall Dayroom  Group Topic: Stress Management  Goal Area(s) Addresses:  Patient will identify positive stress management techniques. Patient will identify benefits of using stress management post d/c.  Intervention: Stress Management  Activity :  Progressive Muscle Relaxation.  LRT introduced the stress management technique of progressive muscle relaxation.  LRT lead patients in tensing each muscle group individually then releasing it.  Patients were to follow along as LRT lead them through the exercise.  Education:  Stress Management, Discharge Planning.   Education Outcome: Acknowledges Education  Clinical Observations/Feedback: Pt did not attend group.     Victorino Sparrow, LRT/CTRS         Victorino Sparrow A 11/06/2018 11:14 AM

## 2018-11-06 NOTE — Progress Notes (Signed)
[facility] MD Progress Note  11/06/2018 12:17 PM Andrea [name]  MRN:  [id number] Subjective:  "I'm doing better."  Andrea [name] has been sitting in the dayroom socializing. She reports some improvement in mood. Abilify was increased yesterday, and she denies medication side effects. She denies any further thoughts that her deceased son is going to "take her away" overnight. She denies SI or self-harm thoughts today but states "I don't know what I'm going to do" tomorrow on her son's birthday and reiterates that her son was "my whole life." She awoke at 3am this morning and was unable to return to sleep due to thinking about tomorrow. Denies nightmares. She has been writing out a plan for tomorrow to keep herself busy. She plans to write a letter to her son and to try to remember positive memories rather than reflecting on his suicide. 1:1 monitoring continues.   From admission H&P: 43 year old female, known to our unit from prior admission in February 2020. Her 61 year old son committed suicide ( January 30th/2020) after which she was admitted to psychiatric unit and then participated in [facility]. States "for a while I was doing OK, reaching out to people". States " then all of a sudden June came up and it just hit me, because his birthday is on the 20th". She also states that her surviving son, who is 74, " does not want to talk to me". Reports she developed suicidal ideations , with thoughts of killing herself " specifically on June 19th, because I want to be with him for his birthday". States she was feeling that her son" would come get me , so I could join him".   Principal Problem: MDD (major depressive disorder), recurrent severe, without psychosis ([place]) Diagnosis: Principal Problem:   MDD (major depressive disorder), recurrent severe, without psychosis ([place])  Total Time spent with patient: 20 minutes  Past Psychiatric History: See admission H&P  Past Medical History:  Past Medical History:   Diagnosis Date  . Anxiety   . Arthritis    ruptured lumbar disc-careful with positioning  . Blood dyscrasia    protein s deficiency-no treatment since 2006  . Depression   . GERD (gastroesophageal reflux disease)   . Headache(784.0)   . Hemorrhoids   . High cholesterol   . Hyperlipemia   . Hypertension   . Hypothyroidism   . IBS (irritable bowel syndrome)   . MVP (mitral valve prolapse)    echo per dr Dagmar Hait  . Protein S deficiency ([place]) 2003   dvt/pulmonary embolus-on coumadin for 6 months then off-Sees [facility]  . Pulmonary embolism ([place])   . PVC (premature ventricular contraction)     Past Surgical History:  Procedure Laterality Date  . CESAREAN SECTION  2006  . CHOLECYSTECTOMY N/A 11/03/2014   Procedure: LAPAROSCOPIC CHOLECYSTECTOMY WITH INTRAOPERATIVE CHOLANGIOGRAM;  Surgeon: Georganna Skeans, MD;  Location: [place];  Service: General;  Laterality: N/A;  . LAPAROSCOPIC ASSISTED VAGINAL HYSTERECTOMY  03/27/2011   Procedure: LAPAROSCOPIC ASSISTED VAGINAL HYSTERECTOMY;  Surgeon: Cyril Mourning, MD;  Location: [facility];  Service: Gynecology;  Laterality: N/A;  . SALPINGOOPHORECTOMY  03/27/2011   Procedure: SALPINGO OOPHERECTOMY;  Surgeon: Cyril Mourning, MD;  Location: [facility];  Service: Gynecology;  Laterality: Bilateral;  . TONSILLECTOMY  92  . vaginal reconstructive surgery  2001   Family History:  Family History  Problem Relation Age of Onset  . High blood pressure Mother   . Anxiety disorder Mother   . Depression  Mother   . OCD Mother   . Physical abuse Mother   . Hyperlipidemia Father        fathers side of family  . High blood pressure Father   . ADD / ADHD Son   . Seizures Son   . ADD / ADHD Son   . Hypertension Other        entire family on both sides  . Asthma Son   . Asthma Son   . Clotting disorder Maternal Uncle   . Clotting disorder Paternal Grandmother   . Heart disease Maternal Grandfather   . Alcohol abuse Maternal Grandfather   . Anxiety  disorder Maternal Grandmother    Family Psychiatric  History: See admission H&P Social History:  Social History   Substance and Sexual Activity  Alcohol Use Never  . Frequency: Never     Social History   Substance and Sexual Activity  Drug Use No    Social History   Socioeconomic History  . Marital status: Divorced    Spouse name: Not on file  . Number of children: 2  . Years of education: Not on file  . Highest education level: Not on file  Occupational History  . Occupation: nutrition    Employer: [employer]  Social Needs  . Financial resource strain: Not hard at all  . Food insecurity    Worry: Never true    Inability: Never true  . Transportation needs    Medical: No    Non-medical: No  Tobacco Use  . Smoking status: Former Smoker    Packs/day: 1.00    Years: 15.00    Pack years: 15.00    Types: Cigarettes, E-cigarettes    Quit date: 03/18/2004    Years since quitting: 14.6  . Smokeless tobacco: Never Used  . Tobacco comment: States she vaps several times a weeks but no nicotine in the ones she uses  Substance and Sexual Activity  . Alcohol use: Never    Frequency: Never  . Drug use: No  . Sexual activity: Not Currently  Lifestyle  . Physical activity    Days per week: 3 days    Minutes per session: 30 min  . Stress: To some extent  Relationships  . Social connections    Talks on phone: More than three times a week    Gets together: More than three times a week    Attends religious service: Never    Active member of club or organization: No    Attends meetings of clubs or organizations: Never    Relationship status: Divorced  Other Topics Concern  . Not on file  Social History Narrative   Diet: Regular   Caffeine: Yes   Married- divorced, married in 2006   House: Yes, 2 persons   Pets: 1 dog   Current/Past profession: [employer] 2002-2013   Exercise: Yes, walking   Living Will: No    DNR: No    POA/HPOA: No        Additional Social History:                         Sleep: Fair  Appetite:  Good  Current Medications: Current Facility-Administered Medications  Medication Dose Route Frequency Provider Last Rate Last Dose  . acetaminophen (TYLENOL) tablet 650 mg  650 mg Oral Q6H PRN Ethelene Hal, NP   650 mg at 11/05/18 1659  . ALPRAZolam (XANAX XR) 24 hr tablet 2  mg  2 mg Oral Daily Cobos, Myer Peer, MD   2 mg at 11/06/18 0746  . ALPRAZolam Duanne Moron) tablet 0.5 mg  0.5 mg Oral Q6H PRN Cobos, Myer Peer, MD   0.5 mg at 11/05/18 2112  . alum & mag hydroxide-simeth (MAALOX/MYLANTA) 200-200-20 MG/5ML suspension 30 mL  30 mL Oral Q4H PRN Ethelene Hal, NP      . ARIPiprazole (ABILIFY) tablet 10 mg  10 mg Oral Daily Sharma Covert, MD   10 mg at 11/06/18 0747  . aspirin EC tablet 81 mg  81 mg Oral Daily Ethelene Hal, NP   81 mg at 11/06/18 0746  . cholecalciferol (VITAMIN D3) tablet 1,000 Units  1,000 Units Oral Daily Ethelene Hal, NP   1,000 Units at 11/06/18 0747  . colestipol (COLESTID) tablet 2 g  2 g Oral BID Lindon Romp A, NP   2 g at 11/06/18 0746  . FLUoxetine (PROZAC) capsule 40 mg  40 mg Oral Daily Cobos, Myer Peer, MD   40 mg at 11/06/18 0747  . hydrocortisone cream 1 %   Topical QID PRN Cobos, Myer Peer, MD      . levothyroxine (SYNTHROID) tablet 125 mcg  125 mcg Oral Q0600 Ethelene Hal, NP   125 mcg at 11/06/18 [phone]  . loperamide (IMODIUM) capsule 2 mg  2 mg Oral PRN Sharma Covert, MD      . magnesium hydroxide (MILK OF MAGNESIA) suspension 30 mL  30 mL Oral Daily PRN Ethelene Hal, NP      . metoprolol tartrate (LOPRESSOR) tablet 50 mg  50 mg Oral BID Sharma Covert, MD   50 mg at 11/06/18 0746  . pantoprazole (PROTONIX) EC tablet 80 mg  80 mg Oral Q1200 Ethelene Hal, NP   80 mg at 11/05/18 1217  . prazosin (MINIPRESS) capsule 1 mg  1 mg Oral QHS Ethelene Hal, NP   1 mg at 11/05/18 2112  .  rosuvastatin (CRESTOR) tablet 10 mg  10 mg Oral Daily Ethelene Hal, NP   10 mg at 11/06/18 0746  . traZODone (DESYREL) tablet 50 mg  50 mg Oral QHS PRN Cobos, Myer Peer, MD   50 mg at 11/05/18 2112    Lab Results: No results found for this or any previous visit (from the past 48 hour(s)).  Blood Alcohol level:  Lab Results  Component Value Date   ETH <10 19/37/9024    Metabolic Disorder Labs: Lab Results  Component Value Date   HGBA1C 5.6 06/21/2018   MPG 114.02 06/21/2018   MPG 114 06/13/2017   No results found for: PROLACTIN Lab Results  Component Value Date   CHOL 258 (H) 06/21/2018   TRIG 185 (H) 06/21/2018   HDL 50 06/21/2018   CHOLHDL 5.2 06/21/2018   VLDL 37 06/21/2018   LDLCALC 171 (H) 06/21/2018   LDLCALC 172 (H) 03/13/2018    Physical Findings: AIMS: Facial and Oral Movements Muscles of Facial Expression: None, normal Lips and Perioral Area: None, normal Jaw: None, normal Tongue: None, normal,Extremity Movements Upper (arms, wrists, hands, fingers): None, normal Lower (legs, knees, ankles, toes): None, normal, Trunk Movements Neck, shoulders, hips: None, normal, Overall Severity Severity of abnormal movements (highest score from questions above): None, normal Incapacitation due to abnormal movements: None, normal Patient's awareness of abnormal movements (rate only patient's report): No Awareness, Dental Status Current problems with teeth and/or dentures?: No Does patient usually wear dentures?: No  CIWA:  CIWA-Ar Total: 1 COWS:  COWS Total Score: 3  Musculoskeletal: Strength & Muscle Tone: within normal limits Gait & Station: normal Patient leans: N/A  Psychiatric Specialty Exam: Physical Exam  Nursing note and vitals reviewed. Constitutional: She is oriented to person, place, and time. She appears well-developed and well-nourished.  Cardiovascular: Normal rate.  Respiratory: Effort normal.  Neurological: She is alert and oriented to  person, place, and time.    Review of Systems  Constitutional: Negative.   Respiratory: Negative for cough and shortness of breath.   Cardiovascular: Negative for chest pain.  Gastrointestinal: Negative for diarrhea, nausea and vomiting.  Psychiatric/Behavioral: Positive for depression. Negative for hallucinations, substance abuse and suicidal ideas. The patient is nervous/anxious. The patient does not have insomnia.     Blood pressure 125/90, pulse 81, temperature 98.2 F (36.8 C), temperature source Oral, resp. rate 18, height 5\' 8"  (1.727 m), weight 104.8 kg, last menstrual period 03/19/2011, SpO2 97 %.Body mass index is 35.12 kg/m.  General Appearance: Casual  Eye Contact:  Good  Speech:  Clear and Coherent  Volume:  Normal  Mood:  Anxious  Affect:  Congruent  Thought Process:  Coherent  Orientation:  Full (Time, Place, and Person)  Thought Content:  Rumination  Suicidal Thoughts:  Denies  Homicidal Thoughts:  Denies  Memory:  Immediate;   Fair Recent;   Fair  Judgement:  Fair  Insight:  Fair  Psychomotor Activity:  Normal  Concentration:  Concentration: Fair and Attention Span: Fair  Recall:  [employer] of Knowledge:  Fair  Language:  Good  Akathisia:  No  Handed:  Right  AIMS (if indicated):     Assets:  Communication Skills Desire for Improvement Housing Resilience  ADL's:  Intact  Cognition:  WNL  Sleep:  Number of Hours: 4.75     Treatment Plan Summary: Daily contact with patient to assess and evaluate symptoms and progress in treatment and Medication management   Continue inpatient hospitalization.  Continue Prozac 40 mg PO daily for depression/anxiety Continue Abilify 10 mg PO daily for mood Continue Xanax XR 2 mg PO daily for anxiety Continue Xanax 0.5 mg PO Q6HR PRN anxiety Continue ASA 81 mg PO daily for antiplatelet Continue vitamin D 1000 IU PO daily for supplementation Continue Synthroid 125 mcg PO daily for hypothyroidism Continue Lopressor  50 mg PO BID for HTN Continue Protonix 80 mg PO daily for GERD Continue Minipress 1 mg PO QHS for nightmares Continue trazodone 50 mg PO QHS PRN insomnia  Patient will participate in the therapeutic group milieu.  Discharge disposition in progress.   Connye Burkitt, NP 11/06/2018, 12:17 PM

## 2018-11-06 NOTE — Progress Notes (Signed)
Pt sitting in the dayroom reading a book. No inappropriate behaviors noted. Pt remains on 1:1 observation for safety.

## 2018-11-07 MED ORDER — TRAZODONE HCL 50 MG PO TABS
50.0000 mg | ORAL_TABLET | Freq: Once | ORAL | Status: AC
  Administered 2018-11-07: 50 mg via ORAL
  Filled 2018-11-07: qty 1

## 2018-11-07 NOTE — Progress Notes (Signed)
1:1 Note Pt woke up at 1:30 am stating that she could not sleep. Pt took trazodone 50 mg and xanax 0.5 mg before bed. A one time order of another 50 mg of trazodone given. Pt is still up talking to staff at this time, remain on 1:1, will continue to monitor.

## 2018-11-07 NOTE — Progress Notes (Signed)
D   Pt talked about what today meant to her and said she wrote her deceased son a 5 page letter   She said she felt better after she did   She said she just wants the day to be over with but feels like she has done good and her plan for coping has worked so far A   Pt is on 1:1 For safety    Medications administered and effectiveness monitored   Q 15 min checks  Verbal support given R  Pt is safe at this time  [place] CORONAVIRUS (COVID-19) DAILY CHECK-OFF SYMPTOMS - answer yes or no to each - every day NO YES  Have you had a fever in the past 24 hours?  . Fever (Temp > 37.80C / 100F) X   Have you had any of these symptoms in the past 24 hours? . New Cough .  Sore Throat  .  Shortness of Breath .  Difficulty Breathing .  Unexplained Body Aches   X   Have you had any one of these symptoms in the past 24 hours not related to allergies?   . Runny Nose .  Nasal Congestion .  Sneezing   X   If you have had runny nose, nasal congestion, sneezing in the past 24 hours, has it worsened?  X   EXPOSURES - check yes or no X   Have you traveled outside the state in the past 14 days?  X   Have you been in contact with someone with a confirmed diagnosis of COVID-19 or PUI in the past 14 days without wearing appropriate PPE?  X   Have you been living in the same home as a person with confirmed diagnosis of COVID-19 or a PUI (household contact)?    X   Have you been diagnosed with COVID-19?    X              What to do next: Answered NO to all: Answered YES to anything:   Proceed with unit schedule Follow the [facility] Inpatient Flowsheet.

## 2018-11-07 NOTE — [facility] Group Notes (Signed)
LCSW Group Therapy Note  11/07/2018     11:15AM-12:00PM  Type of Therapy and Topic:  Group Therapy:  Self Sabotage  Participation Level:  Did Not Attend        . Description of Group:  Today's process group focused on the topic of Self Sabotage, what this is, and what methods of self-sabotage patients in the group have found themselves using.  Commonalities were then pointed out and the group explored possible benefits of choosing healthier coping skills.  Patients were asked to rate both their commitment to change and their confidence in their ability to change from 1 (lowest) to 10 (highest), then asked about their answers in order to provoke change talk.   Therapeutic Goals 1. Patient will be able to identify their typical methods of self sabotage. 2. Patient will list reasons they engage in these destructive behaviors, and harm that comes from them 3. Patient will be able verbalize the costs and benefits of drinking/drugging versus making the choice to change 4. Patient will rate their commitment to change and confidence about their ability to change, and will be guided to change talk.  Summary of Patient Progress: N/A - did not attend  Therapeutic Modalities Stages of Change Motivational Interviewing  Selmer Dominion, LCSW 11/07/2018, 12:55 PM

## 2018-11-07 NOTE — Progress Notes (Signed)
1:1 Note Pt is sitting in the day room at this time with peers listening to the peer sing. Pt stated she has had a good day, but anxious about tomorrow being the birthday of her deceased son. Reports passive SI, remains on 1:1 for safety, will continue to monitor.

## 2018-11-07 NOTE — Progress Notes (Signed)
Nursing Note 1:1  Patient alert and oriented, smiling, conversing with MHT in hallway- Improved mood this shift. Pt remains safe with 1:1 sitter and Q 15 min checks

## 2018-11-07 NOTE — Progress Notes (Signed)
[facility] MD Progress Note  11/07/2018 1:07 PM Andrea [name]  MRN:  [id number] Subjective:  Patient is a 43 year old female with a past psychiatric history significant for major depression, posttraumatic stress disorder and borderline personality disorder who was admitted on 11/02/2018 with worsening suicidal ideation.  Objective: Patient is seen and examined.  Patient is a 43 year old female with the above-stated past psychiatric history who is seen in follow-up.  She stated that she made it through the night, and she is alive.  She stated that that is made her feel better.  She stated she is going to write a letter to her deceased son and tell him all the things that she wants to.  We discussed the possibility of going to the [facility] facility in [place].  She is not wanting at this point to go to a residential program, but I stated that I felt that would be beneficial to her.  I told her that her current therapist felt overwhelmed by her, and was uncomfortable seeing her back in the office.  I told her I felt as though an experienced therapist would be overwhelmed by her situation.  She spoke to the nurse practitioner yesterday about obtaining and a FMLA for her father.  Her father wants to leave his job, and stay with her 24/7.  She also stated she was willing to go to [facility] and stay at a hotel and go to a day program because her father would pay for it.  She stated that her father told her that insomnia was a side effect of the Abilify, but she feels as though it is being beneficial.  She denied any suicidal ideation this a.m.  Her vital signs are stable, she is afebrile.  Nursing notes reflect she slept 5.25 hours last night.  No new laboratories.  Principal Problem: MDD (major depressive disorder), recurrent severe, without psychosis ([place]) Diagnosis: Principal Problem:   MDD (major depressive disorder), recurrent severe, without psychosis ([place])  Total Time spent with patient: 20  minutes  Past Psychiatric History: See admission H&P  Past Medical History:  Past Medical History:  Diagnosis Date  . Anxiety   . Arthritis    ruptured lumbar disc-careful with positioning  . Blood dyscrasia    protein s deficiency-no treatment since 2006  . Depression   . GERD (gastroesophageal reflux disease)   . Headache(784.0)   . Hemorrhoids   . High cholesterol   . Hyperlipemia   . Hypertension   . Hypothyroidism   . IBS (irritable bowel syndrome)   . MVP (mitral valve prolapse)    echo per dr Dagmar Hait  . Protein S deficiency ([place]) 2003   dvt/pulmonary embolus-on coumadin for 6 months then off-Sees [facility]  . Pulmonary embolism ([place])   . PVC (premature ventricular contraction)     Past Surgical History:  Procedure Laterality Date  . CESAREAN SECTION  2006  . CHOLECYSTECTOMY N/A 11/03/2014   Procedure: LAPAROSCOPIC CHOLECYSTECTOMY WITH INTRAOPERATIVE CHOLANGIOGRAM;  Surgeon: Georganna Skeans, MD;  Location: [place];  Service: General;  Laterality: N/A;  . LAPAROSCOPIC ASSISTED VAGINAL HYSTERECTOMY  03/27/2011   Procedure: LAPAROSCOPIC ASSISTED VAGINAL HYSTERECTOMY;  Surgeon: Cyril Mourning, MD;  Location: [facility];  Service: Gynecology;  Laterality: N/A;  . SALPINGOOPHORECTOMY  03/27/2011   Procedure: SALPINGO OOPHERECTOMY;  Surgeon: Cyril Mourning, MD;  Location: [facility];  Service: Gynecology;  Laterality: Bilateral;  . TONSILLECTOMY  92  . vaginal reconstructive surgery  2001   Family History:  Family History  Problem Relation Age of Onset  . High blood pressure Mother   . Anxiety disorder Mother   . Depression Mother   . OCD Mother   . Physical abuse Mother   . Hyperlipidemia Father        fathers side of family  . High blood pressure Father   . ADD / ADHD Son   . Seizures Son   . ADD / ADHD Son   . Hypertension Other        entire family on both sides  . Asthma Son   . Asthma Son   . Clotting disorder Maternal Uncle   . Clotting disorder Paternal  Grandmother   . Heart disease Maternal Grandfather   . Alcohol abuse Maternal Grandfather   . Anxiety disorder Maternal Grandmother    Family Psychiatric  History: See admission H&P Social History:  Social History   Substance and Sexual Activity  Alcohol Use Never  . Frequency: Never     Social History   Substance and Sexual Activity  Drug Use No    Social History   Socioeconomic History  . Marital status: Divorced    Spouse name: Not on file  . Number of children: 2  . Years of education: Not on file  . Highest education level: Not on file  Occupational History  . Occupation: nutrition    Employer: [employer]  Social Needs  . Financial resource strain: Not hard at all  . Food insecurity    Worry: Never true    Inability: Never true  . Transportation needs    Medical: No    Non-medical: No  Tobacco Use  . Smoking status: Former Smoker    Packs/day: 1.00    Years: 15.00    Pack years: 15.00    Types: Cigarettes, E-cigarettes    Quit date: 03/18/2004    Years since quitting: 14.6  . Smokeless tobacco: Never Used  . Tobacco comment: States she vaps several times a weeks but no nicotine in the ones she uses  Substance and Sexual Activity  . Alcohol use: Never    Frequency: Never  . Drug use: No  . Sexual activity: Not Currently  Lifestyle  . Physical activity    Days per week: 3 days    Minutes per session: 30 min  . Stress: To some extent  Relationships  . Social connections    Talks on phone: More than three times a week    Gets together: More than three times a week    Attends religious service: Never    Active member of club or organization: No    Attends meetings of clubs or organizations: Never    Relationship status: Divorced  Other Topics Concern  . Not on file  Social History Narrative   Diet: Regular   Caffeine: Yes   Married- divorced, married in 2006   House: Yes, 2 persons   Pets: 1 dog   Current/Past profession: [employer] 2002-2013   Exercise: Yes, walking   Living Will: No    DNR: No    POA/HPOA: No       Additional Social History:                         Sleep: Fair  Appetite:  Good  Current Medications: Current Facility-Administered Medications  Medication Dose Route Frequency Provider Last Rate Last Dose  . acetaminophen (TYLENOL) tablet 650 mg  650  mg Oral Q6H PRN Ethelene Hal, NP   650 mg at 11/06/18 1227  . ALPRAZolam (XANAX XR) 24 hr tablet 2 mg  2 mg Oral Daily Cobos, Myer Peer, MD   2 mg at 11/07/18 0817  . ALPRAZolam Duanne Moron) tablet 0.5 mg  0.5 mg Oral Q6H PRN Cobos, Myer Peer, MD   0.5 mg at 11/07/18 0648  . alum & mag hydroxide-simeth (MAALOX/MYLANTA) 200-200-20 MG/5ML suspension 30 mL  30 mL Oral Q4H PRN Ethelene Hal, NP      . ARIPiprazole (ABILIFY) tablet 10 mg  10 mg Oral Daily Sharma Covert, MD   10 mg at 11/07/18 0816  . aspirin EC tablet 81 mg  81 mg Oral Daily Ethelene Hal, NP   81 mg at 11/07/18 2620  . cholecalciferol (VITAMIN D3) tablet 1,000 Units  1,000 Units Oral Daily Ethelene Hal, NP   1,000 Units at 11/07/18 [phone]  . colestipol (COLESTID) tablet 2 g  2 g Oral BID Lindon Romp A, NP   2 g at 11/07/18 0817  . FLUoxetine (PROZAC) capsule 40 mg  40 mg Oral Daily Cobos, Myer Peer, MD   40 mg at 11/07/18 0817  . hydrocortisone cream 1 %   Topical QID PRN Cobos, Myer Peer, MD      . levothyroxine (SYNTHROID) tablet 125 mcg  125 mcg Oral Q0600 Ethelene Hal, NP   125 mcg at 11/07/18 [phone]  . loperamide (IMODIUM) capsule 2 mg  2 mg Oral PRN Sharma Covert, MD      . OLANZapine zydis [facility]) disintegrating tablet 10 mg  10 mg Oral Q8H PRN Sharma Covert, MD       And  . LORazepam (ATIVAN) tablet 1 mg  1 mg Oral PRN Sharma Covert, MD       And  . ziprasidone (GEODON) injection 20 mg  20 mg Intramuscular PRN Sharma Covert, MD      . magnesium hydroxide (MILK OF MAGNESIA) suspension 30 mL  30 mL  Oral Daily PRN Ethelene Hal, NP      . metoprolol tartrate (LOPRESSOR) tablet 50 mg  50 mg Oral BID Sharma Covert, MD   50 mg at 11/07/18 0817  . pantoprazole (PROTONIX) EC tablet 80 mg  80 mg Oral Q1200 Ethelene Hal, NP   80 mg at 11/07/18 1246  . prazosin (MINIPRESS) capsule 1 mg  1 mg Oral QHS Ethelene Hal, NP   1 mg at 11/06/18 2111  . rosuvastatin (CRESTOR) tablet 10 mg  10 mg Oral Daily Ethelene Hal, NP   10 mg at 11/07/18 0817  . traZODone (DESYREL) tablet 50 mg  50 mg Oral QHS PRN Cobos, Myer Peer, MD   50 mg at 11/06/18 2114    Lab Results: No results found for this or any previous visit (from the past 48 hour(s)).  Blood Alcohol level:  Lab Results  Component Value Date   ETH <10 38/45/3646    Metabolic Disorder Labs: Lab Results  Component Value Date   HGBA1C 5.6 06/21/2018   MPG 114.02 06/21/2018   MPG 114 06/13/2017   No results found for: PROLACTIN Lab Results  Component Value Date   CHOL 258 (H) 06/21/2018   TRIG 185 (H) 06/21/2018   HDL 50 06/21/2018   CHOLHDL 5.2 06/21/2018   VLDL 37 06/21/2018   LDLCALC 171 (H) 06/21/2018   LDLCALC 172 (H) 03/13/2018    Physical Findings:  AIMS: Facial and Oral Movements Muscles of Facial Expression: None, normal Lips and Perioral Area: None, normal Jaw: None, normal Tongue: None, normal,Extremity Movements Upper (arms, wrists, hands, fingers): None, normal Lower (legs, knees, ankles, toes): None, normal, Trunk Movements Neck, shoulders, hips: None, normal, Overall Severity Severity of abnormal movements (highest score from questions above): None, normal Incapacitation due to abnormal movements: None, normal Patient's awareness of abnormal movements (rate only patient's report): No Awareness, Dental Status Current problems with teeth and/or dentures?: No Does patient usually wear dentures?: No  CIWA:  CIWA-Ar Total: 1 COWS:  COWS Total Score: 3  Musculoskeletal: Strength &  Muscle Tone: within normal limits Gait & Station: normal Patient leans: N/A  Psychiatric Specialty Exam: Physical Exam  Nursing note and vitals reviewed. Constitutional: She is oriented to person, place, and time. She appears well-developed and well-nourished.  HENT:  Head: Normocephalic and atraumatic.  Respiratory: Effort normal.  Neurological: She is alert and oriented to person, place, and time.    ROS  Blood pressure 125/88, pulse 92, temperature 98.5 F (36.9 C), temperature source Oral, resp. rate 18, height 5\' 8"  (1.727 m), weight 104.8 kg, last menstrual period 03/19/2011, SpO2 98 %.Body mass index is 35.12 kg/m.  General Appearance: Casual  Eye Contact:  Good  Speech:  Normal Rate  Volume:  Normal  Mood:  Euthymic  Affect:  Congruent  Thought Process:  Coherent and Descriptions of Associations: Intact  Orientation:  Full (Time, Place, and Person)  Thought Content:  Rumination  Suicidal Thoughts:  No  Homicidal Thoughts:  No  Memory:  Immediate;   Good Recent;   Good Remote;   Good  Judgement:  Intact  Insight:  Lacking  Psychomotor Activity:  Normal  Concentration:  Concentration: Fair and Attention Span: Fair  Recall:  [employer] of Knowledge:  Good  Language:  Good  Akathisia:  Negative  Handed:  Right  AIMS (if indicated):     Assets:  Desire for Improvement Resilience  ADL's:  Intact  Cognition:  WNL  Sleep:  Number of Hours: 5.25     Treatment Plan Summary: Daily contact with patient to assess and evaluate symptoms and progress in treatment, Medication management and Plan : Patient is seen and examined.  Patient is a 43 year old female with the above-stated past psychiatric history who is seen in follow-up.   Diagnosis: #1 major depression, recurrent, severe without psychosis, #2 posttraumatic stress disorder, #3 borderline personality disorder (with components of narcissism).  Patient is seen in follow-up.  Her mood is better today given the fact  that she is "alive", and that her son did not come for her.  We discussed going to the [facility] residential program, and she is thinking about it but would prefer to not be an inpatient there.  She denied current suicidal ideation, she denied any current psychotic symptoms.  She is concerned that the Abilify is making her have insomnia.  We will monitor this for 1 more day, and if it continues I will reduce the dose of her Abilify.  No other changes in her medications. 1. Continue Xanax extended release 2 mg p.o. daily for anxiety. 2. Continue Xanax short acting 0.5 mg p.o. every 6 hours as needed anxiety. 3. Increase Abilify to 10 mg p.o. daily for mood stability. 4. Continue fluoxetine 40 mg p.o. daily for mood and anxiety. 5. Continue vitamin D3 at thousand units p.o. daily for nutritional supplementation. 6. Continue colestipol 2 g p.o. twice daily for  diarrhea. 7. Continue levothyroxine 125 mcg p.o. daily for hypothyroidism. 8.  Continue Lopressor to 50 mg p.o. twice daily for hypertension. 9. Continue pantoprazole 80 mg p.o. daily for gastric protection. 10. Continue prazosin 1 mg p.o. nightly for nightmares and flashbacks from posttraumatic stress disorder. 11. Continue Crestor 10 mg p.o. daily for hyperlipidemia. 12. Continue trazodone 50 mg p.o. nightly as needed insomnia. 13. Disposition planning-in progress. Sharma Covert, MD 11/07/2018, 1:07 PM

## 2018-11-07 NOTE — Progress Notes (Signed)
[place] NOVEL CORONAVIRUS (COVID-19) DAILY CHECK-OFF SYMPTOMS - answer yes or no to each - every day NO YES  Have you had a fever in the past 24 hours?  . Fever (Temp > 37.80C / 100F) X   Have you had any of these symptoms in the past 24 hours? . New Cough .  Sore Throat  .  Shortness of Breath .  Difficulty Breathing .  Unexplained Body Aches   X   Have you had any one of these symptoms in the past 24 hours not related to allergies?   . Runny Nose .  Nasal Congestion .  Sneezing   X   If you have had runny nose, nasal congestion, sneezing in the past 24 hours, has it worsened?  X   EXPOSURES - check yes or no X   Have you traveled outside the state in the past 14 days?  X   Have you been in contact with someone with a confirmed diagnosis of COVID-19 or PUI in the past 14 days without wearing appropriate PPE?  X   Have you been living in the same home as a person with confirmed diagnosis of COVID-19 or a PUI (household contact)?    X   Have you been diagnosed with COVID-19?    X              What to do next: Answered NO to all: Answered YES to anything:   Proceed with unit schedule Follow the [facility] Inpatient Flowsheet.   

## 2018-11-07 NOTE — Progress Notes (Signed)
1:1 Note  Pt resting with eyes closed -respirations even and unlabored- 1:1 sitter sitting in room. Pt remains safe with Q 15 min checks and 1:1 observation

## 2018-11-07 NOTE — [facility] Group Notes (Signed)
[place] Group Notes:  (Nursing)  Date:  11/07/2018  Time:  130 PM Type of Therapy:  Nurse Education  Participation Level:  Did Not Attend    Waymond Cera 11/07/2018, 3:05 PM

## 2018-11-07 NOTE — Progress Notes (Addendum)
D. Pt tearful this am- stating, "My emotions are up and down today since this is my son's birthday". - Per pt's self inventory, pt rated her depression, hopelessness and anxiety a 6/3/9, respectively.Pt writes that her goal today is to "write a letter to my son about me getting through the grief".  Pt currently denies SI/HI and A/V hallucination.  A. Labs and vitals monitored. Pt compliant with medications. Pt supported emotionally and encouraged to express concerns and ask questions.   R. Pt remains safe with 15 minute checks. Will continue POC.

## 2018-11-07 NOTE — Progress Notes (Signed)
1:1 Note Pt woke up this morning observed in the hallway with peers. Pt stated she slept well and feeling better than she thought. Remains on 1;1 for safety, will continue to monitor.

## 2018-11-07 NOTE — Progress Notes (Signed)
1:1 Nursing Note  Patient resting in bed- appears to be sleeping- resp even and unlabored. Patient remains safe with 1:1 sitter at bedside

## 2018-11-08 MED ORDER — ARIPIPRAZOLE 5 MG PO TABS
5.0000 mg | ORAL_TABLET | Freq: Every day | ORAL | Status: DC
  Administered 2018-11-09: 5 mg via ORAL
  Filled 2018-11-08 (×3): qty 1

## 2018-11-08 NOTE — [facility] Group Notes (Signed)
[place] LCSW Group Therapy Note  11/08/2018   10:00-11:00AM  Type of Therapy and Topic:  Group Therapy:  Unhealthy versus Healthy Supports, Which Am I?  Participation Level:  Active   Description of Group:  Patients in this group were introduced to the concept that additional supports including self-support are an essential part of recovery.  Initially a discussion was held about the differences between healthy versus unhealthy supports.  Patients were asked to share what unhealthy supports in their lives need to be addressed, as well as what additional healthy supports could be added for greater help in reaching their goals.   A song entitled "My Own Hero" was played and a group discussion ensued in which patients stated they could relate to the song and it inspired them to realize they have be willing to help themselves in order to succeed, because other people cannot achieve sobriety or stability for them.    Therapeutic Goals: 1)  Highlight the differences between healthy and unhealthy supports 2)  Suggest the importance of being a part of one's own support system 2)  Discuss reasons people in one's life may eventually be unable to be continually supportive  3)  Identify the patient's current support system   4) Elicit commitments to add healthy supports and to become more conscious of being self-supportive   Summary of Patient Progress:  The patient listed as healthy supports the following:  Her father, bereavement team, [facility], [place] and herself.  The patient expressed that unhealthy supports to be addressed include none because she has "cut them out if they were negative."  Patient feels they are a healthy support for themselves.  Healthy supports which could be added for increased stability and happiness include just taking care of herself right now and allowing herself time to heal.  Therapeutic Modalities:   Motivational Interviewing Activity  Maretta Los , MSW, LCSW

## 2018-11-08 NOTE — Progress Notes (Signed)
1:1 note   Pt in bed resting with eyes closed  No distress noted   Pt is on a 1:1 for safety    Safety maintained

## 2018-11-08 NOTE — Progress Notes (Signed)
1:1- Nursing Note  D: Pt currently sitting in dayroom interacting appropriately with peers. Sitter is currently sitting next to pt.  A: Emotional support given, pt given opportunity to express concerns.  R: Pt remains safe on a 1:1 per MD orders. Pt in no current distress. Will continue to monitor.

## 2018-11-08 NOTE — Progress Notes (Signed)
1:1-   D: Pt currently in her room eating dinner with 1:1 sitter at bedside. . A: Emotional support given, pt given opportunity to express concerns. R: Pt remains safe on a 1:1 per MD orders. Pt in no current distress. Will continue to monitor.

## 2018-11-08 NOTE — Progress Notes (Signed)
1:1-   D: Pt currently sitting on bed conversing with 1:1 sitter in room.  A: Emotional support given, pt given opportunity to express concerns.  R: Pt remains safe on a 1:1 per MD orders. Pt in no current distress. Will continue to monitor.

## 2018-11-08 NOTE — Progress Notes (Addendum)
D. Pt pleasant and friendly upon approach this am- reports that she is doing better now that her son's birthday is past- stating, " the anticipation of his upcoming birthday was worse". Per pt's self inventory, pt rated her depression, hopelessness and anxiety a 3/3/4, respectively. Pt writes that her goal today is "continue to work on re-framing my thoughts". Pt writes on her self inventory sheet : "Thank you to all staff!! You all saved my life".  Pt visible in the milieu interacting well with peers and staff. Pt currently denies SI/HI and AVH  A. Labs and vitals monitored. Pt compliant with medications. Pt supported emotionally and encouraged to express concerns and ask questions.   R. Pt remains safe with 15 minute checks. Will continue POC.

## 2018-11-08 NOTE — Progress Notes (Signed)
[place] NOVEL CORONAVIRUS (COVID-19) DAILY CHECK-OFF SYMPTOMS - answer yes or no to each - every day NO YES  Have you had a fever in the past 24 hours?  . Fever (Temp > 37.80C / 100F) X   Have you had any of these symptoms in the past 24 hours? . New Cough .  Sore Throat  .  Shortness of Breath .  Difficulty Breathing .  Unexplained Body Aches   X   Have you had any one of these symptoms in the past 24 hours not related to allergies?   . Runny Nose .  Nasal Congestion .  Sneezing   X   If you have had runny nose, nasal congestion, sneezing in the past 24 hours, has it worsened?  X   EXPOSURES - check yes or no X   Have you traveled outside the state in the past 14 days?  X   Have you been in contact with someone with a confirmed diagnosis of COVID-19 or PUI in the past 14 days without wearing appropriate PPE?  X   Have you been living in the same home as a person with confirmed diagnosis of COVID-19 or a PUI (household contact)?    X   Have you been diagnosed with COVID-19?    X              What to do next: Answered NO to all: Answered YES to anything:   Proceed with unit schedule Follow the [facility] Inpatient Flowsheet.   

## 2018-11-08 NOTE — Progress Notes (Signed)
[facility] MD Progress Note  11/08/2018 12:18 PM Andrea [name]  MRN:  [id number] Subjective:  Patient is a 43 year old female with a past psychiatric history significant for major depression, posttraumatic stress disorder and borderline personality disorder who was admitted on 11/02/2018 with worsening suicidal ideation.  Objective: Patient is seen and examined.  Patient is a 43 year old female with the above-stated past psychiatric history who is seen in follow-up.  She stated she was able to ride her letter to her son.  She stated was 5 pages long.  She stated she felt better now, and was not suicidal.  She stated she was not going to try and go to [employer].  She stated she spoke to her father about this, and they will just try this at home with her "support system".  She denied any suicidal ideation today.  She denied any auditory or visual hallucinations.  She did state that she still having problems with insomnia with the Abilify, and we discussed decreasing that dosage.  Her vital signs are stable, she is afebrile.  She slept 5.75 hours last night.  Principal Problem: MDD (major depressive disorder), recurrent severe, without psychosis ([place]) Diagnosis: Principal Problem:   MDD (major depressive disorder), recurrent severe, without psychosis ([place])  Total Time spent with patient: 15 minutes  Past Psychiatric History: See admission H&P  Past Medical History:  Past Medical History:  Diagnosis Date  . Anxiety   . Arthritis    ruptured lumbar disc-careful with positioning  . Blood dyscrasia    protein s deficiency-no treatment since 2006  . Depression   . GERD (gastroesophageal reflux disease)   . Headache(784.0)   . Hemorrhoids   . High cholesterol   . Hyperlipemia   . Hypertension   . Hypothyroidism   . IBS (irritable bowel syndrome)   . MVP (mitral valve prolapse)    echo per dr Dagmar Hait  . Protein S deficiency ([place]) 2003   dvt/pulmonary embolus-on coumadin for 6 months then  off-Sees [facility]  . Pulmonary embolism ([place])   . PVC (premature ventricular contraction)     Past Surgical History:  Procedure Laterality Date  . CESAREAN SECTION  2006  . CHOLECYSTECTOMY N/A 11/03/2014   Procedure: LAPAROSCOPIC CHOLECYSTECTOMY WITH INTRAOPERATIVE CHOLANGIOGRAM;  Surgeon: Georganna Skeans, MD;  Location: [facility];  Service: General;  Laterality: N/A;  . LAPAROSCOPIC ASSISTED VAGINAL HYSTERECTOMY  03/27/2011   Procedure: LAPAROSCOPIC ASSISTED VAGINAL HYSTERECTOMY;  Surgeon: Cyril Mourning, MD;  Location: [facility];  Service: Gynecology;  Laterality: N/A;  . SALPINGOOPHORECTOMY  03/27/2011   Procedure: SALPINGO OOPHERECTOMY;  Surgeon: Cyril Mourning, MD;  Location: [facility];  Service: Gynecology;  Laterality: Bilateral;  . TONSILLECTOMY  92  . vaginal reconstructive surgery  2001   Family History:  Family History  Problem Relation Age of Onset  . High blood pressure Mother   . Anxiety disorder Mother   . Depression Mother   . OCD Mother   . Physical abuse Mother   . Hyperlipidemia Father        fathers side of family  . High blood pressure Father   . ADD / ADHD Son   . Seizures Son   . ADD / ADHD Son   . Hypertension Other        entire family on both sides  . Asthma Son   . Asthma Son   . Clotting disorder Maternal Uncle   . Clotting disorder Paternal Grandmother   . Heart disease Maternal Grandfather   .  Alcohol abuse Maternal Grandfather   . Anxiety disorder Maternal Grandmother    Family Psychiatric  History: See admission H&P Social History:  Social History   Substance and Sexual Activity  Alcohol Use Never  . Frequency: Never     Social History   Substance and Sexual Activity  Drug Use No    Social History   Socioeconomic History  . Marital status: Divorced    Spouse name: Not on file  . Number of children: 2  . Years of education: Not on file  . Highest education level: Not on file  Occupational History  . Occupation: nutrition     Employer: [employer]  Social Needs  . Financial resource strain: Not hard at all  . Food insecurity    Worry: Never true    Inability: Never true  . Transportation needs    Medical: No    Non-medical: No  Tobacco Use  . Smoking status: Former Smoker    Packs/day: 1.00    Years: 15.00    Pack years: 15.00    Types: Cigarettes, E-cigarettes    Quit date: 03/18/2004    Years since quitting: 14.6  . Smokeless tobacco: Never Used  . Tobacco comment: States she vaps several times a weeks but no nicotine in the ones she uses  Substance and Sexual Activity  . Alcohol use: Never    Frequency: Never  . Drug use: No  . Sexual activity: Not Currently  Lifestyle  . Physical activity    Days per week: 3 days    Minutes per session: 30 min  . Stress: To some extent  Relationships  . Social connections    Talks on phone: More than three times a week    Gets together: More than three times a week    Attends religious service: Never    Active member of club or organization: No    Attends meetings of clubs or organizations: Never    Relationship status: Divorced  Other Topics Concern  . Not on file  Social History Narrative   Diet: Regular   Caffeine: Yes   Married- divorced, married in 2006   House: Yes, 2 persons   Pets: 1 dog   Current/Past profession: [employer] 2002-2013   Exercise: Yes, walking   Living Will: No    DNR: No    POA/HPOA: No       Additional Social History:                         Sleep: Fair  Appetite:  Good  Current Medications: Current Facility-Administered Medications  Medication Dose Route Frequency Provider Last Rate Last Dose  . acetaminophen (TYLENOL) tablet 650 mg  650 mg Oral Q6H PRN Ethelene Hal, NP   650 mg at 11/07/18 2158  . ALPRAZolam (XANAX XR) 24 hr tablet 2 mg  2 mg Oral Daily Cobos, Myer Peer, MD   2 mg at 11/08/18 0748  . ALPRAZolam Duanne Moron) tablet 0.5 mg  0.5 mg Oral Q6H PRN Cobos, Myer Peer, MD    0.5 mg at 11/07/18 2158  . alum & mag hydroxide-simeth (MAALOX/MYLANTA) 200-200-20 MG/5ML suspension 30 mL  30 mL Oral Q4H PRN Ethelene Hal, NP      . ARIPiprazole (ABILIFY) tablet 10 mg  10 mg Oral Daily Sharma Covert, MD   10 mg at 11/08/18 0748  . aspirin EC tablet 81 mg  81 mg Oral Daily Romilda Garret,  Billey Chang, NP   81 mg at 11/08/18 0747  . cholecalciferol (VITAMIN D3) tablet 1,000 Units  1,000 Units Oral Daily Ethelene Hal, NP   1,000 Units at 11/08/18 0747  . colestipol (COLESTID) tablet 2 g  2 g Oral BID Lindon Romp A, NP   2 g at 11/08/18 0748  . FLUoxetine (PROZAC) capsule 40 mg  40 mg Oral Daily Cobos, Myer Peer, MD   40 mg at 11/08/18 0748  . hydrocortisone cream 1 %   Topical QID PRN Cobos, Myer Peer, MD      . levothyroxine (SYNTHROID) tablet 125 mcg  125 mcg Oral Q0600 Ethelene Hal, NP   125 mcg at 11/08/18 [phone]  . loperamide (IMODIUM) capsule 2 mg  2 mg Oral PRN Sharma Covert, MD      . OLANZapine zydis [facility]) disintegrating tablet 10 mg  10 mg Oral Q8H PRN Sharma Covert, MD       And  . LORazepam (ATIVAN) tablet 1 mg  1 mg Oral PRN Sharma Covert, MD       And  . ziprasidone (GEODON) injection 20 mg  20 mg Intramuscular PRN Sharma Covert, MD      . magnesium hydroxide (MILK OF MAGNESIA) suspension 30 mL  30 mL Oral Daily PRN Ethelene Hal, NP      . metoprolol tartrate (LOPRESSOR) tablet 50 mg  50 mg Oral BID Sharma Covert, MD   50 mg at 11/08/18 0748  . pantoprazole (PROTONIX) EC tablet 80 mg  80 mg Oral Q1200 Ethelene Hal, NP   80 mg at 11/08/18 1139  . prazosin (MINIPRESS) capsule 1 mg  1 mg Oral QHS Ethelene Hal, NP   1 mg at 11/07/18 2158  . rosuvastatin (CRESTOR) tablet 10 mg  10 mg Oral Daily Ethelene Hal, NP   10 mg at 11/08/18 0747  . traZODone (DESYREL) tablet 50 mg  50 mg Oral QHS PRN Cobos, Myer Peer, MD   50 mg at 11/07/18 2158    Lab Results: No results found for this  or any previous visit (from the past 48 hour(s)).  Blood Alcohol level:  Lab Results  Component Value Date   ETH <10 18/84/1660    Metabolic Disorder Labs: Lab Results  Component Value Date   HGBA1C 5.6 06/21/2018   MPG 114.02 06/21/2018   MPG 114 06/13/2017   No results found for: PROLACTIN Lab Results  Component Value Date   CHOL 258 (H) 06/21/2018   TRIG 185 (H) 06/21/2018   HDL 50 06/21/2018   CHOLHDL 5.2 06/21/2018   VLDL 37 06/21/2018   LDLCALC 171 (H) 06/21/2018   LDLCALC 172 (H) 03/13/2018    Physical Findings: AIMS: Facial and Oral Movements Muscles of Facial Expression: None, normal Lips and Perioral Area: None, normal Jaw: None, normal Tongue: None, normal,Extremity Movements Upper (arms, wrists, hands, fingers): None, normal Lower (legs, knees, ankles, toes): None, normal, Trunk Movements Neck, shoulders, hips: None, normal, Overall Severity Severity of abnormal movements (highest score from questions above): None, normal Incapacitation due to abnormal movements: None, normal Patient's awareness of abnormal movements (rate only patient's report): No Awareness, Dental Status Current problems with teeth and/or dentures?: No Does patient usually wear dentures?: No  CIWA:  CIWA-Ar Total: 1 COWS:  COWS Total Score: 3  Musculoskeletal: Strength & Muscle Tone: within normal limits Gait & Station: normal Patient leans: N/A  Psychiatric Specialty Exam: Physical Exam  Nursing  note and vitals reviewed. Constitutional: She is oriented to person, place, and time. She appears well-developed and well-nourished.  HENT:  Head: Normocephalic and atraumatic.  Respiratory: Effort normal.  Neurological: She is alert and oriented to person, place, and time.    ROS  Blood pressure 101/71, pulse 78, temperature 98.4 F (36.9 C), temperature source Oral, resp. rate 18, height 5\' 8"  (1.727 m), weight 104.8 kg, last menstrual period 03/19/2011, SpO2 97 %.Body mass index  is 35.12 kg/m.  General Appearance: Casual  Eye Contact:  Fair  Speech:  Normal Rate  Volume:  Normal  Mood:  Anxious  Affect:  Congruent  Thought Process:  Coherent and Descriptions of Associations: Intact  Orientation:  Full (Time, Place, and Person)  Thought Content:  Logical  Suicidal Thoughts:  No  Homicidal Thoughts:  No  Memory:  Immediate;   Fair Recent;   Fair Remote;   Fair  Judgement:  Intact  Insight:  Lacking  Psychomotor Activity:  Normal  Concentration:  Concentration: Fair and Attention Span: Fair  Recall:  [employer] of Knowledge:  Fair  Language:  Fair  Akathisia:  Negative  Handed:  Right  AIMS (if indicated):     Assets:  Desire for Improvement Resilience  ADL's:  Intact  Cognition:  WNL  Sleep:  Number of Hours: 5.75     Treatment Plan Summary: Daily contact with patient to assess and evaluate symptoms and progress in treatment, Medication management and Plan : Patient is seen and examined.  Patient is a 43 year old female with the above-stated past psychiatric history who is seen in follow-up.   Diagnosis: #1 major depression, recurrent, severe without psychosis, #2 posttraumatic stress disorder, #3 borderline personality disorder(with components of narcissism).  Patient is seen in follow-up.  The date of her son's birthday is passed, and she is still "alive".  She is written her goodbye letter to her son.  She is decided against going to [employer].  Given the side effects the Abilify I will reduce the dose of her Abilify down to 5 mg.  No other changes in her medicines.  The plan will be to discharge her home tomorrow. 1. Continue Xanax extended release 2 mg p.o. daily for anxiety. 2. Continue Xanax short acting 0.5 mg p.o. every 6 hours as needed anxiety. 3. Decrease Abilify to5mg  p.o. daily for mood stability. 4. Continue fluoxetine 40 mg p.o. daily for mood and anxiety. 5. Continue vitamin D3 at thousand units p.o. daily for  nutritional supplementation. 6. Continue colestipol 2 g p.o. twice daily for diarrhea. 7. Continue levothyroxine 125 mcg p.o. daily for hypothyroidism. 8. ContinueLopressor to 50mg  p.o. twice daily for hypertension. 9. Continue pantoprazole 80 mg p.o. daily for gastric protection. 10. Continue prazosin 1 mg p.o. nightly for nightmares and flashbacks from posttraumatic stress disorder. 11. Continue Crestor 10 mg p.o. daily for hyperlipidemia. 12. Continue trazodone 50 mg p.o. nightly as needed insomnia. 13. Disposition planning-in progress.  Sharma Covert, MD 11/08/2018, 12:18 PM

## 2018-11-08 NOTE — Progress Notes (Signed)
Pt reports feeling relieved that the time had passed and she was able to cope with her sons birthday    She is on a 1:1 for safety and is presently safe

## 2018-11-08 NOTE — Progress Notes (Addendum)
Pt visible in the dayroom interacting well with others   She does endorse depression but reports feeling much better  Pt is on 1:1 for safety   Safety maintained  [place] NOVEL CORONAVIRUS (COVID-19) DAILY CHECK-OFF SYMPTOMS - answer yes or no to each - every day NO YES  Have you had a fever in the past 24 hours?  . Fever (Temp > 37.80C / 100F) X   Have you had any of these symptoms in the past 24 hours? . New Cough .  Sore Throat  .  Shortness of Breath .  Difficulty Breathing .  Unexplained Body Aches   X   Have you had any one of these symptoms in the past 24 hours not related to allergies?   . Runny Nose .  Nasal Congestion .  Sneezing   X   If you have had runny nose, nasal congestion, sneezing in the past 24 hours, has it worsened?  X   EXPOSURES - check yes or no X   Have you traveled outside the state in the past 14 days?  X   Have you been in contact with someone with a confirmed diagnosis of COVID-19 or PUI in the past 14 days without wearing appropriate PPE?  X   Have you been living in the same home as a person with confirmed diagnosis of COVID-19 or a PUI (household contact)?    X   Have you been diagnosed with COVID-19?    X              What to do next: Answered NO to all: Answered YES to anything:   Proceed with unit schedule Follow the [facility] Inpatient Flowsheet.

## 2018-11-09 MED ORDER — FLUOXETINE HCL 40 MG PO CAPS: 40.0000 mg | ORAL_CAPSULE | Freq: Every day | ORAL | 0 refills | Status: AC

## 2018-11-09 MED ORDER — PRAZOSIN HCL 1 MG PO CAPS: 1.0000 mg | ORAL_CAPSULE | Freq: Every day | ORAL | 0 refills | Status: DC

## 2018-11-09 MED ORDER — ARIPIPRAZOLE 5 MG PO TABS: 5.0000 mg | ORAL_TABLET | Freq: Every day | ORAL | 0 refills | Status: DC

## 2018-11-09 MED ORDER — TRAZODONE HCL 50 MG PO TABS: 50.0000 mg | ORAL_TABLET | Freq: Every evening | ORAL | 0 refills | Status: DC | PRN

## 2018-11-09 MED ORDER — METOPROLOL TARTRATE 50 MG PO TABS: 50.0000 mg | ORAL_TABLET | Freq: Two times a day (BID) | ORAL | 0 refills | Status: DC

## 2018-11-09 MED ORDER — VITAMIN D 25 MCG (1000 UNIT) PO TABS: 1000.0000 [IU] | ORAL_TABLET | Freq: Every day | ORAL | Status: DC

## 2018-11-09 NOTE — Progress Notes (Signed)
Pt woke up around 430 this morning and said she just couldn't sleep   She said she believes it is the Abilify she takes   She is pleasant and cooperative and denies SI   Pt on 1:1 for safety  Safety maintained

## 2018-11-09 NOTE — Progress Notes (Signed)
Pt came to nurses station around 2 am and said she couldn't sleep and could she have another sleeping pill    Medications administered and effectiveness monitored    Pt is calm cooperative and denies SI   Pt on 1:1 for safety  Remains safe at this time

## 2018-11-09 NOTE — Progress Notes (Signed)
  Observation note;  Pt observed sitting in her room with sitter present at bedside. Pt verbalizes readiness for discharge today. Pt denies SI. Pt expressed that she was able to write her son a five page letter on his birthday anniversary and it gave her peace. Pt expressed she's now wanting to help others in the community. Pt remains safe at this time and no inappropriate behaviors observed or reported.

## 2018-11-09 NOTE — Progress Notes (Signed)
CSW is aware of anticipated discharge today. Patient has continued to refuse referrals for continued inpatient treatment.  Patient wishes to discharge and participate in PHP through [facility], but patient has been declined.  CSW following to determine if patient is able to return to [facility] for medication management and therapy, or if patient will need to start outpatient services with a new provider.  Stephanie Acre, LCSW-A Clinical Social Worker

## 2018-11-09 NOTE — Progress Notes (Signed)
  [facility] Adult Case Management Discharge Plan :  Will you be returning to the same living situation after discharge:  Yes,  home At discharge, do you have transportation home?: Yes,  father is picking up Do you have the ability to pay for your medications: Yes,  [employer] Medicare.  Release of information consent forms completed and in the chart. Patient to Follow up at: Follow-up Information    Chucky May, MD Follow up on 11/11/2018.   Specialty: Psychiatry Why: Virtual medication management appointment is  Wednesday, 6./24 at 10:45a.  Please call the office within 24 hours of discharge to provide your email adress and set up the appointment.  Contact information: [address] [address] [place] [address] [phone]        [facility]. Call on 11/10/2018.   Specialty: Behavioral Health Why: Telephone appointment with Lise Auer will be held on Tuesday, 06/23 at 8:00am.  Contact information: [place] [phone]          Next level of care provider has access to [facility] and Suicide Prevention discussed: Yes,  with father.    Has patient been referred to the Quitline?: N/A patient is not a smoker  Patient has been referred for addiction treatment: Yes  Joellen Jersey, [place] 11/09/2018, 10:19 AM

## 2018-11-09 NOTE — Progress Notes (Signed)
Nursing discharge note: Patient discharged home per MD order.  Patient received all personal belongings from unit and locker.  Reviewed AVS/transition record with patient and she indicates understanding.  Patient will follow up with Dr. Toy Care.  Patient denies any thoughts of self harm.  She left ambulatory with her dad.  Patient will pick up her prescriptions from her drug store.
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
## 2018-11-09 NOTE — Plan of Care (Signed)
Patient discharged home per MD order.  Patient was appreciative of staff's assistance.  She denies any thoughts of self harm.  She will follow up with Dr. Toy Care.  She left ambulatory with her dad.  Problem: Education: Goal: Ability to make informed decisions regarding treatment will improve Outcome: Completed/Met   Problem: Coping: Goal: Coping ability will improve Outcome: Completed/Met   Problem: Health Behavior/Discharge Planning: Goal: Identification of resources available to assist in meeting health care needs will improve Outcome: Completed/Met   Problem: Medication: Goal: Compliance with prescribed medication regimen will improve Outcome: Completed/Met   Problem: Self-Concept: Goal: Ability to disclose and discuss suicidal ideas will improve Outcome: Completed/Met Goal: Will verbalize positive feelings about self Outcome: Completed/Met   Problem: Education: Goal: Utilization of techniques to improve thought processes will improve Outcome: Completed/Met Goal: Knowledge of the prescribed therapeutic regimen will improve Outcome: Completed/Met   Problem: Activity: Goal: Interest or engagement in leisure activities will improve Outcome: Completed/Met Goal: Imbalance in normal sleep/wake cycle will improve Outcome: Completed/Met   Problem: Coping: Goal: Coping ability will improve Outcome: Completed/Met Goal: Will verbalize feelings Outcome: Completed/Met   Problem: Health Behavior/Discharge Planning: Goal: Ability to make decisions will improve Outcome: Completed/Met Goal: Compliance with therapeutic regimen will improve Outcome: Completed/Met   Problem: Role Relationship: Goal: Will demonstrate positive changes in social behaviors and relationships Outcome: Completed/Met   Problem: Safety: Goal: Ability to disclose and discuss suicidal ideas will improve Outcome: Completed/Met Goal: Ability to identify and utilize support systems that promote safety will  improve Outcome: Completed/Met   Problem: Self-Concept: Goal: Will verbalize positive feelings about self Outcome: Completed/Met Goal: Level of anxiety will decrease Outcome: Completed/Met   Problem: Education: Goal: Knowledge of [place] [facility] Education information/materials will improve Outcome: Completed/Met Goal: Emotional status will improve Outcome: Completed/Met Goal: Mental status will improve Outcome: Completed/Met Goal: Verbalization of understanding the information provided will improve Outcome: Completed/Met   Problem: Activity: Goal: Interest or engagement in activities will improve Outcome: Completed/Met Goal: Sleeping patterns will improve Outcome: Completed/Met   Problem: Coping: Goal: Ability to verbalize frustrations and anger appropriately will improve Outcome: Completed/Met Goal: Ability to demonstrate self-control will improve Outcome: Completed/Met   Problem: Health Behavior/Discharge Planning: Goal: Identification of resources available to assist in meeting health care needs will improve Outcome: Completed/Met Goal: Compliance with treatment plan for underlying cause of condition will improve Outcome: Completed/Met   Problem: Physical Regulation: Goal: Ability to maintain clinical measurements within normal limits will improve Outcome: Completed/Met   Problem: Safety: Goal: Periods of time without injury will increase Outcome: Completed/Met

## 2018-11-10 ENCOUNTER — Ambulatory Visit (HOSPITAL_COMMUNITY): Payer: Medicare HMO | Admitting: Psychiatry

## 2018-11-10 ENCOUNTER — Ambulatory Visit (INDEPENDENT_AMBULATORY_CARE_PROVIDER_SITE_OTHER): Payer: Medicare HMO | Admitting: Psychiatry

## 2018-11-10 ENCOUNTER — Other Ambulatory Visit: Payer: Self-pay

## 2018-11-10 ENCOUNTER — Telehealth: Payer: Self-pay | Admitting: *Deleted

## 2018-11-10 DIAGNOSIS — R4589 Other symptoms and signs involving emotional state: Secondary | ICD-10-CM

## 2018-11-10 DIAGNOSIS — F332 Major depressive disorder, recurrent severe without psychotic features: Secondary | ICD-10-CM | POA: Diagnosis not present

## 2018-11-10 DIAGNOSIS — R69 Illness, unspecified: Secondary | ICD-10-CM | POA: Diagnosis not present

## 2018-11-10 DIAGNOSIS — F4310 Post-traumatic stress disorder, unspecified: Secondary | ICD-10-CM

## 2018-11-10 NOTE — Telephone Encounter (Signed)
I have made the 1st attempt to contact the patient or family member in charge, in order to follow up from recently being discharged from the hospital. I left a message on voicemail but I will make another attempt at a different time.  

## 2018-11-11 ENCOUNTER — Other Ambulatory Visit: Payer: Self-pay | Admitting: Nurse Practitioner

## 2018-11-11 ENCOUNTER — Other Ambulatory Visit: Payer: Self-pay | Admitting: *Deleted

## 2018-11-11 DIAGNOSIS — E559 Vitamin D deficiency, unspecified: Secondary | ICD-10-CM

## 2018-11-11 DIAGNOSIS — E034 Atrophy of thyroid (acquired): Secondary | ICD-10-CM

## 2018-11-11 MED ORDER — METOPROLOL TARTRATE 50 MG PO TABS: 50.0000 mg | ORAL_TABLET | Freq: Two times a day (BID) | ORAL | 0 refills | Status: DC

## 2018-11-11 MED ORDER — SYNTHROID 125 MCG PO TABS: ORAL_TABLET | ORAL | 0 refills | Status: DC

## 2018-11-11 NOTE — Telephone Encounter (Signed)
Patient requested refill for 1 more month due to new PCP appointment in July.   Janett Billow approved 1 month supply to be given.

## 2018-11-11 NOTE — Progress Notes (Signed)
Virtual Visit via Video Note  I connected with [name] on 11/11/18 at  8:00 AM EDT by a video enabled telemedicine application and verified that I am speaking with the correct person using two identifiers.  Location: Patient: Andrea [name] Provider: Lise Auer, LCSW   I discussed the limitations of evaluation and management by telemedicine and the availability of in person appointments. The patient expressed understanding and agreed to proceed.  History of Present Illness: MDD, PTSD and Difficulty Coping   Observations/Objective: Counselor met with [name] for individual therapy via Webex. Faithlyn was discharged from a week and a half stay at the inpatient [facility] at [facility]. Counselor assessed MH symptoms and progress in treatment. Lavenia current denied suicidal ideation or self-harm behaviors. Moksha reported that she had not cried since Saturday, which was her late son's birthday. She had previously planned to complete suicide on that day. Kawanna expressed gratitude to the therapist and all the other mental health professionals who provided care for her during such an emotional time. Saidah identified that she put in the work while she was there, the Abilify has been helpful, she was able to reframe her grief statements, and found that structuring out her difficult days are most helpful in her coping abilities. Counselor discussed moving forward in treatment, reviewed her treatment plan goals and crisis plan, and ensuring that she is connected with other supportive services such as [facility]. Counselor praised her for the work she has done, her honesty about needing help, her engaging in the therapeutic process and her ability to keep herself safe. Counselor will set up future appointments and we will focus on trauma work moving forward to decrease trauma triggers and responses.    Assessment and Plan: Counselor will continue to meet with [name] to address treatment plan goals. Alondra will continue to follow recommendations of providers and implement skills learned in session.  Follow Up Instructions: Counselor will send information for next session via Webex.     I discussed the assessment and treatment plan with the patient. The patient was provided an opportunity to ask questions and all were answered. The patient agreed with the plan and demonstrated an understanding of the instructions.   The patient was advised to call back or seek an in-person evaluation if the symptoms worsen or if the condition fails to improve as anticipated.  I provided 60 minutes of non-face-to-face time during this encounter.   Lise Auer, LCSW

## 2018-11-11 NOTE — Telephone Encounter (Signed)
Called patient and spoke with patient. She stated that she is switching to a different PCP and has an appointment in July. Stated that she has been going through a lot right now. Stated that she almost committed Suicide. Patient stated that she is safe now and has her dad. Stated that her son committed Suicide in January and she is having a hard time with it. Stated that she is seeing a Teacher, music.   Patient is wanting to know if you will refill her Metoprolol one more month until her appointment with her new provider in July. Please Advise.

## 2018-11-11 NOTE — Telephone Encounter (Signed)
Refill faxed to pharmacy.

## 2018-11-11 NOTE — Telephone Encounter (Signed)
Yes this is fine.

## 2018-11-17 ENCOUNTER — Ambulatory Visit (HOSPITAL_COMMUNITY): Payer: Medicare HMO | Admitting: Psychiatry

## 2018-11-18 ENCOUNTER — Ambulatory Visit (INDEPENDENT_AMBULATORY_CARE_PROVIDER_SITE_OTHER): Payer: Medicare HMO | Admitting: Psychiatry

## 2018-11-18 ENCOUNTER — Other Ambulatory Visit: Payer: Self-pay

## 2018-11-18 DIAGNOSIS — F332 Major depressive disorder, recurrent severe without psychotic features: Secondary | ICD-10-CM | POA: Diagnosis not present

## 2018-11-18 DIAGNOSIS — R4589 Other symptoms and signs involving emotional state: Secondary | ICD-10-CM | POA: Diagnosis not present

## 2018-11-18 DIAGNOSIS — R69 Illness, unspecified: Secondary | ICD-10-CM | POA: Diagnosis not present

## 2018-11-18 DIAGNOSIS — F4310 Post-traumatic stress disorder, unspecified: Secondary | ICD-10-CM | POA: Diagnosis not present

## 2018-11-18 NOTE — Progress Notes (Signed)
Virtual Visit via Video Note  I connected with Andrea [name] on 11/18/18 at  3:00 PM EDT by a video enabled telemedicine application and verified that I am speaking with the correct person using two identifiers.  Location: Patient: Andrea [name] Provider:  , LCSW   I discussed the limitations of evaluation and management by telemedicine and the availability of in person appointments. The patient expressed understanding and agreed to proceed.  History of Present Illness: MDD, PTSD, difficulty coping.    Observations/Objective: Counselor met with Andrea [name] for individual therapy via Webex. Counselor assessed MH symptoms and progress on treatment plan goals. Andrea [name] reported suicidal ideations over the weekend when she was triggered by cooking a meal for her dad, knowing her son who passed would not be able to enjoy the meal too. Andrea [name] reported that she was able to correct her thinking and named several motivators to live now.  She denied self-harm behaviors. Andrea [name] shared that overall she has been doing much better, getting up early to walk, journaling, created an affirmations jar, reached out to her son's grandparents to give them his things back, attempting to cook a meal, and attending several supportive services. She reported that her father was able to get FMLA to be home with her for a few weeks for additional support. Counselor and Andrea [name] explored feelings about giving her son's dog tags to his grandparents, since it was the last thing she has from his last day. Counselor and Andrea [name] came up with meaning making for the object and Andrea [name] will determine which method makes the most sense for her emotionally before sending it off. Counselor and Andrea [name] identified that she remains in the bargaining and anger stage of the grief and loss cycle. Counselor challenged her to write down questions she has, angry feelings and things she has accepted. Counselor praised Andrea [name]  for all the hard emotional work she is doing, how she has found a new motivation for living, and for her engagement in treatments.     Assessment and Plan: Counselor will continue to meet with Andrea [name] to address treatment plan goals. Andrea [name] will continue to follow recommendations of providers and implement skills learned in session.  Follow Up Instructions: Counselor will send information for next session via Webex.     I discussed the assessment and treatment plan with the patient. The patient was provided an opportunity to ask questions and all were answered. The patient agreed with the plan and demonstrated an understanding of the instructions.   The patient was advised to call back or seek an in-person evaluation if the symptoms worsen or if the condition fails to improve as anticipated.  I provided 60 minutes of non-face-to-face time during this encounter.    , LCSW  

## 2018-11-19 ENCOUNTER — Encounter (HOSPITAL_COMMUNITY): Payer: Self-pay | Admitting: Psychiatry

## 2018-11-24 ENCOUNTER — Other Ambulatory Visit: Payer: Self-pay

## 2018-11-24 ENCOUNTER — Encounter (HOSPITAL_COMMUNITY): Payer: Self-pay | Admitting: Psychiatry

## 2018-11-24 ENCOUNTER — Ambulatory Visit (INDEPENDENT_AMBULATORY_CARE_PROVIDER_SITE_OTHER): Payer: Medicare HMO | Admitting: Psychiatry

## 2018-11-24 DIAGNOSIS — F4310 Post-traumatic stress disorder, unspecified: Secondary | ICD-10-CM | POA: Diagnosis not present

## 2018-11-24 DIAGNOSIS — R69 Illness, unspecified: Secondary | ICD-10-CM | POA: Diagnosis not present

## 2018-11-24 DIAGNOSIS — F332 Major depressive disorder, recurrent severe without psychotic features: Secondary | ICD-10-CM

## 2018-11-24 NOTE — Progress Notes (Signed)
Virtual Visit via Video Note  I connected with [name] on 11/24/18 at 11:00 AM EDT by a video enabled telemedicine application and verified that I am speaking with the correct person using two identifiers.  Location: Patient: Andrea [name] Provider: Lise Auer, LCSW   I discussed the limitations of evaluation and management by telemedicine and the availability of in person appointments. The patient expressed understanding and agreed to proceed.  History of Present Illness: MDD and  PTSD   Observations/Objective: Counselor met with [name] for individual therapy via Webex. Counselor assessed MH symptoms and progress on treatment plan goals. Mckinna denied suicidal ideation but endorsed the urge to self-harm behaviors. The desire to cut was disclosed to her father, they processed, he monitored and they were able to do distraction techniques to subside the urges. Counselor praised Engineer, civil (consulting) for following her safety plan and for being honest about this issue in session. Counselor provided alternative coping skills to implement next time to have more tools in case current do not work. We discussed safety measures that need to be implemented regarding knives and other sharp objects in the home as way of prevention. [name] excitedly shared that she plans to change her name tomorrow, she has reconnected with old friends, she has started cooking again, she is engaged in several supportive community programs and she is walking regularly and for longer distances. Counselor and [name] celebrated these achievements and processed the significance of the improvements she is making on several domains. [name] shared that she is having self-image and self-esteem issues. Counselor provided CBT interventions and strategies to promoted healthy self-image and self-esteem and client was excited to try new ideas. We also discussed how her thoughts about her body are tied to the loss of her son who  passed this year and the loss of her oldest son who no longer speaks with her. Counselor and [name] explored repurposing her body, reclaiming it and loving it for all the abilities and capabilities it has. Counselor provided her with resources to explore more in this area, that we will discuss at our next session.   Assessment and Plan: Counselor will continue to meet with [name] to address treatment plan goals. Kaelan will continue to follow recommendations of providers and implement skills learned in session.  Follow Up Instructions: Counselor will send information for next session via Webex.     I discussed the assessment and treatment plan with the patient. The patient was provided an opportunity to ask questions and all were answered. The patient agreed with the plan and demonstrated an understanding of the instructions.   The patient was advised to call back or seek an in-person evaluation if the symptoms worsen or if the condition fails to improve as anticipated.  I provided 60 minutes of non-face-to-face time during this encounter.   Lise Auer, LCSW

## 2018-11-30 DIAGNOSIS — Z79891 Long term (current) use of opiate analgesic: Secondary | ICD-10-CM | POA: Diagnosis not present

## 2018-11-30 DIAGNOSIS — M5416 Radiculopathy, lumbar region: Secondary | ICD-10-CM | POA: Diagnosis not present

## 2018-11-30 DIAGNOSIS — G894 Chronic pain syndrome: Secondary | ICD-10-CM | POA: Diagnosis not present

## 2018-11-30 DIAGNOSIS — M4726 Other spondylosis with radiculopathy, lumbar region: Secondary | ICD-10-CM | POA: Diagnosis not present

## 2018-12-01 ENCOUNTER — Other Ambulatory Visit: Payer: Self-pay

## 2018-12-01 ENCOUNTER — Ambulatory Visit (INDEPENDENT_AMBULATORY_CARE_PROVIDER_SITE_OTHER): Payer: Medicare HMO | Admitting: Psychiatry

## 2018-12-01 DIAGNOSIS — F332 Major depressive disorder, recurrent severe without psychotic features: Secondary | ICD-10-CM | POA: Diagnosis not present

## 2018-12-01 DIAGNOSIS — F4310 Post-traumatic stress disorder, unspecified: Secondary | ICD-10-CM | POA: Diagnosis not present

## 2018-12-01 DIAGNOSIS — R69 Illness, unspecified: Secondary | ICD-10-CM | POA: Diagnosis not present

## 2018-12-02 ENCOUNTER — Encounter (HOSPITAL_COMMUNITY): Payer: Self-pay | Admitting: Psychiatry

## 2018-12-02 NOTE — Progress Notes (Signed)
Virtual Visit via Video Note  I connected with Andrea [name] on 12/02/18 at 11:00 AM EDT by a video enabled telemedicine application and verified that I am speaking with the correct person using two identifiers.  Location: Patient: Andrea [name]) [place] Provider: Lise Auer, LCSW   I discussed the limitations of evaluation and management by telemedicine and the availability of in person appointments. The patient expressed understanding and agreed to proceed.  History of Present Illness: MDD and PTSD   Observations/Objective: [name] met with Andrea [name] for individual therapy via Webex. [name] assessed MH symptoms and progress on treatment plan goals. Andrea [name] denied suicidal ideation or self-harm behaviors, however she reported that she has had increased urges to cut and fixations on sharp objects. [name] reviewed safety plan and Andrea [name] affirmed that she and her father have distraction methods and have the sharp objects away from her access. [name] thanked Andrea [name] for being honest and open about the urges. Andrea [name] shared that she believes her Abilify dosage increase was causing negative side effects and that she would be in touch with the prescriber to get it reduced back to 68m. [name] and Andrea [name] trauma triggers and responses that occurred in her life over the past week. We discussed more intensive options that are brain based in targeting and reducing the impact of the trauma of finding her son after his completion of suicide. [name] will reach out and follow up with other providers to provide Andrea [name] information. [name] processed grief and loss issues identified by Andrea [name] assessed daily function and involvement in support groups and supportive services. Andrea [name] that she had her name changed officially for Andrea [name] and that she would be getting documentation over to insurance and medical providers for continuity.     Assessment and Plan: [name] will continue to meet with Andrea [name] address treatment plan goals. Andrea [name] continue to follow recommendations of providers and implement skills learned in session.  Follow Up Instructions: [name] will send information for next session via Webex.     I discussed the assessment and treatment plan with the patient. The patient was provided an opportunity to ask questions and all were answered. The patient agreed with the plan and demonstrated an understanding of the instructions.   The patient was advised to call back or seek an in-person evaluation if the symptoms worsen or if the condition fails to improve as anticipated.  I provided 60 minutes of non-face-to-face time during this encounter.   BLise Auer LCSW

## 2018-12-08 ENCOUNTER — Encounter (HOSPITAL_COMMUNITY): Payer: Self-pay | Admitting: Psychiatry

## 2018-12-08 ENCOUNTER — Other Ambulatory Visit: Payer: Self-pay

## 2018-12-08 ENCOUNTER — Ambulatory Visit (INDEPENDENT_AMBULATORY_CARE_PROVIDER_SITE_OTHER): Payer: Medicare HMO | Admitting: Psychiatry

## 2018-12-08 DIAGNOSIS — R4589 Other symptoms and signs involving emotional state: Secondary | ICD-10-CM

## 2018-12-08 DIAGNOSIS — F4310 Post-traumatic stress disorder, unspecified: Secondary | ICD-10-CM | POA: Diagnosis not present

## 2018-12-08 DIAGNOSIS — R69 Illness, unspecified: Secondary | ICD-10-CM | POA: Diagnosis not present

## 2018-12-08 DIAGNOSIS — F332 Major depressive disorder, recurrent severe without psychotic features: Secondary | ICD-10-CM | POA: Diagnosis not present

## 2018-12-08 NOTE — Progress Notes (Signed)
Virtual Visit via Video Note  I connected with [name] on 12/08/18 at 11:00 AM EDT by a video enabled telemedicine application and verified that I am speaking with the correct person using two identifiers.  Location: Patient: Andrea [name] Provider: Lise Auer, LCSW   I discussed the limitations of evaluation and management by telemedicine and the availability of in person appointments. The patient expressed understanding and agreed to proceed.  History of Present Illness: MDD, PTSD, and difficulty coping.    Observations/Objective: Counselor met with Andrea [name] for individual therapy via Webex. Counselor assessed MH symptoms and progress on treatment plan goals. Andrea [name] endorsed suicidal ideations and urge to self-harm, following a series of vivid dreams involving her late son. Ted identified that she was able reach out for support immediately from her group leader and from her father. Andrea [name] shared that she does not have a desire or motivation to end her life, she just continues to see upsetting images of her son's death, which causes her distress. Counselor reviewed information sent since last session about [place] and ECT therapies. Her doctor is looking into these options and will give her additional information this week. Counselor explored grief and loss work and progress with Andrea [name]. Counselor gathered information on daily functioning. Andrea [name] noted that she has started drinking alcohol and using marijuana as a coping skill. She reported knowing that they are not healthy coping skills, but more to escape and numb some of her distressing feelings and thoughts. Counselor encouraged Andrea [name] to share this with her psychiatrist, so she will be aware of impacts on medications. Substance use is not currently problematic, but could cause increased depression. Counselor will continue to monitor.   Assessment and Plan: Counselor will continue to meet with Andrea [name] to address  treatment plan goals. Andrea [name] will continue to follow recommendations of providers and implement skills learned in session.  Follow Up Instructions: Counselor will send information for next session via Webex.     I discussed the assessment and treatment plan with the patient. The patient was provided an opportunity to ask questions and all were answered. The patient agreed with the plan and demonstrated an understanding of the instructions.   The patient was advised to call back or seek an in-person evaluation if the symptoms worsen or if the condition fails to improve as anticipated.  I provided 55 minutes of non-face-to-face time during this encounter.   Lise Auer, LCSW

## 2018-12-09 ENCOUNTER — Other Ambulatory Visit: Payer: Self-pay | Admitting: Anesthesiology

## 2018-12-09 DIAGNOSIS — M4726 Other spondylosis with radiculopathy, lumbar region: Secondary | ICD-10-CM

## 2018-12-13 NOTE — Progress Notes (Signed)
Andrea [name] is a 43 y.o. female is here to [facility].   Patient Care Team: Briscoe Deutscher, DO as PCP - General (Family Medicine) Elsie Stain, MD as Attending Physician (Pulmonary Disease) Irene Shipper, MD as Consulting Physician (Gastroenterology) Chucky May, MD as Consulting Physician (Psychiatry) Adrian Prows, MD as Consulting Physician (Cardiology) Dian Queen, MD as Consulting Physician (Obstetrics and Gynecology)   History of Present Illness:   Andrea [name], CMA acting as scribe for Dr. Briscoe Deutscher.   HPI: Patient in to establish care. She does have questions about thyroid. Her last TSH was 06/21/18 and was 3.264. She is currently taking Synthroid 125mg  daily. She is concerned that she is having weigh gain with appitite changes.    Severe depression. Followed by Dr. Toy Care. 30 year old son died by suicide in July 09, 2018. She found him - he both hung himself and shot himself in the head. She cut him down and tried to provide CPR while waiting for EMS. His facial anatomy was destroyed. She has PTSD and flashbacks. She is always passively suicidal. Active plans with admission twice this year. Followed by counselor weekly. Lives with her father and he is supportive - keeps a close eye on her.   Health Maintenance Due  Topic Date Due  . PAP SMEAR-Modifier  02/14/2018   Depression screen PHQ 2/9 12/15/2018  Decreased Interest 3  Down, Depressed, Hopeless 2  PHQ - 2 Score 5  Altered sleeping 3  Tired, decreased energy 2  Change in appetite 3  Feeling bad or failure about yourself  1  Trouble concentrating 3  Moving slowly or fidgety/restless 1  Suicidal thoughts 3  PHQ-9 Score 21  Difficult doing work/chores Somewhat difficult  Some recent data might be hidden    PMHx, SurgHx, SocialHx, Medications, and Allergies were reviewed in the Visit Navigator and updated as appropriate.   Past Medical History:  Diagnosis Date  . Anxiety   .  Arthritis    ruptured lumbar disc-careful with positioning  . Blood dyscrasia    protein s deficiency-no treatment since 2006  . Depression   . Diabetes mellitus without complication ([place])   . GERD (gastroesophageal reflux disease)   . Headache(784.0)   . Hemorrhoids   . High cholesterol   . History of fainting spells of unknown cause   . Hyperlipemia   . Hypertension   . Hypothyroidism   . IBS (irritable bowel syndrome)   . MVP (mitral valve prolapse)    echo per dr Dagmar Hait  . Protein S deficiency ([place]) 2003   dvt/pulmonary embolus-on coumadin for 6 months then off-Sees [facility]  . Pulmonary embolism ([place])   . PVC (premature ventricular contraction)      Past Surgical History:  Procedure Laterality Date  . CESAREAN SECTION  2006  . CHOLECYSTECTOMY N/A 11/03/2014   Procedure: LAPAROSCOPIC CHOLECYSTECTOMY WITH INTRAOPERATIVE CHOLANGIOGRAM;  Surgeon: Georganna Skeans, MD;  Location: [facility];  Service: General;  Laterality: N/A;  . LAPAROSCOPIC ASSISTED VAGINAL HYSTERECTOMY  03/27/2011   Procedure: LAPAROSCOPIC ASSISTED VAGINAL HYSTERECTOMY;  Surgeon: Cyril Mourning, MD;  Location: [facility];  Service: Gynecology;  Laterality: N/A;  . SALPINGOOPHORECTOMY  03/27/2011   Procedure: SALPINGO OOPHERECTOMY;  Surgeon: Cyril Mourning, MD;  Location: [facility];  Service: Gynecology;  Laterality: Bilateral;  . TONSILLECTOMY  92  . vaginal reconstructive surgery  2001     Family History  Problem Relation Age of Onset  . High blood pressure Mother   .  Anxiety disorder Mother   . Depression Mother   . OCD Mother   . Physical abuse Mother   . Alcohol abuse Mother   . Hyperlipidemia Father        fathers side of family  . High blood pressure Father   . Hypertension Father   . ADD / ADHD Son   . Seizures Son   . ADD / ADHD Son   . Hypertension Other        entire family on both sides  . Asthma Son   . Asthma Son   . Clotting disorder Maternal Uncle   . Clotting disorder Paternal  Grandmother   . Heart disease Maternal Grandfather   . Alcohol abuse Maternal Grandfather   . Anxiety disorder Maternal Grandmother     Social History   Tobacco Use  . Smoking status: Former Smoker    Packs/day: 1.00    Years: 15.00    Pack years: 15.00    Types: Cigarettes, E-cigarettes    Quit date: 03/18/2004    Years since quitting: 14.7  . Smokeless tobacco: Never Used  . Tobacco comment: States she vaps several times a weeks but no nicotine in the ones she uses  Substance Use Topics  . Alcohol use: Never    Frequency: Never  . Drug use: No    Current Medications and Allergies   Current Outpatient Medications:  .  ALPRAZolam (XANAX XR) 2 MG 24 hr tablet, Take 1 tablet (2 mg total) by mouth daily. For anxiety, Disp: 5 tablet, Rfl: 0 .  ALPRAZolam (XANAX) 0.5 MG tablet, Take 1 tablet (0.5 mg total) by mouth 2 (two) times daily as needed for anxiety., Disp: 10 tablet, Rfl: 0 .  ARIPiprazole (ABILIFY) 5 MG tablet, Take 1 tablet (5 mg total) by mouth daily., Disp: 30 tablet, Rfl: 0 .  aspirin EC 81 MG tablet, Take 81 mg by mouth daily., Disp: , Rfl:  .  colestipol (COLESTID) 1 g tablet, TAKE 2 TABLETS(2 GRAMS) BY MOUTH TWICE DAILY, Disp: 120 tablet, Rfl: 1 .  ergocalciferol (VITAMIN D2) 1.25 MG (50000 UT) capsule, Take 50,000 Units by mouth once a week., Disp: , Rfl:  .  esomeprazole (NEXIUM) 40 MG capsule, Take 1 capsule (40 mg total) by mouth daily. For acid reflux, Disp: 5 capsule, Rfl: 0 .  FLUoxetine (PROZAC) 40 MG capsule, Take 1 capsule (40 mg total) by mouth daily., Disp: 30 capsule, Rfl: 0 .  metoprolol tartrate (LOPRESSOR) 50 MG tablet, Take 1 tablet (50 mg total) by mouth 2 (two) times daily., Disp: 60 tablet, Rfl: 0 .  prazosin (MINIPRESS) 1 MG capsule, Take 1 capsule (1 mg total) by mouth at bedtime., Disp: 30 capsule, Rfl: 0 .  rosuvastatin (CRESTOR) 20 MG tablet, Take one tablet by mouth once daily: For high cholesterol, Disp: 5 tablet, Rfl: 0 .  SYNTHROID 125 MCG  tablet, TAKE 1 TABLET(125 MCG) BY MOUTH DAILY BEFORE BREAKFAST: For thyroid hormone replacement, Disp: 30 tablet, Rfl: 0 .  traZODone (DESYREL) 50 MG tablet, Take 1 tablet (50 mg total) by mouth at bedtime as needed for sleep., Disp: 30 tablet, Rfl: 0  No Known Allergies Review of Systems   Pertinent items are noted in the HPI. Otherwise, a complete ROS is negative.  Vitals   Vitals:   12/14/18 1018  BP: 105/80  Pulse: 61  Temp: 99.2 F (37.3 C)  TempSrc: Oral  SpO2: 95%  Weight: 227 lb (103 kg)  Height: 5' 6.25" (1.683  m)     Body mass index is 36.36 kg/m.  Physical Exam   Physical Exam  Results for orders placed or performed in visit on 12/14/18  SAR CoV2 Serology (COVID 19)AB(IGG)IA   Specimen: Blood  Result Value Ref Range   SARS CoV2 AB IGG NEGATIVE   CBC with Differential/Platelet  Result Value Ref Range   WBC 5.5 4.0 - 10.5 K/uL   RBC 4.62 3.87 - 5.11 Mil/uL   Hemoglobin 13.7 12.0 - 15.0 g/dL   HCT 41.7 36.0 - 46.0 %   MCV 90.1 78.0 - 100.0 fl   MCHC 32.9 30.0 - 36.0 g/dL   RDW 12.6 11.5 - 15.5 %   Platelets 235.0 150.0 - 400.0 K/uL   Neutrophils Relative % 46.8 43.0 - 77.0 %   Lymphocytes Relative 36.8 12.0 - 46.0 %   Monocytes Relative 6.7 3.0 - 12.0 %   Eosinophils Relative 9.1 (H) 0.0 - 5.0 %   Basophils Relative 0.6 0.0 - 3.0 %   Neutro Abs 2.6 1.4 - 7.7 K/uL   Lymphs Abs 2.0 0.7 - 4.0 K/uL   Monocytes Absolute 0.4 0.1 - 1.0 K/uL   Eosinophils Absolute 0.5 0.0 - 0.7 K/uL   Basophils Absolute 0.0 0.0 - 0.1 K/uL  Comprehensive metabolic panel  Result Value Ref Range   Sodium 138 135 - 145 mEq/L   Potassium 4.1 3.5 - 5.1 mEq/L   Chloride 99 96 - 112 mEq/L   CO2 31 19 - 32 mEq/L   Glucose, Bld 121 (H) 70 - 99 mg/dL   BUN 10 6 - 23 mg/dL   Creatinine, Ser 0.91 0.40 - 1.20 mg/dL   Total Bilirubin 0.6 0.2 - 1.2 mg/dL   Alkaline Phosphatase 76 39 - 117 U/L   AST 48 (H) 0 - 37 U/L   ALT 57 (H) 0 - 35 U/L   Total Protein 6.5 6.0 - 8.3 g/dL    Albumin 4.2 3.5 - 5.2 g/dL   Calcium 10.1 8.4 - 10.5 mg/dL   GFR 66.92 >60.00 mL/min  T4, free  Result Value Ref Range   Free T4 0.91 0.60 - 1.60 ng/dL  Thyroid Peroxidase Antibodies (TPO) (REFL)  Result Value Ref Range   Thyroperoxidase Ab SerPl-aCnc 22 (H) <9 IU/mL  Lipid panel  Result Value Ref Range   Cholesterol 198 0 - 200 mg/dL   Triglycerides 243.0 (H) 0.0 - 149.0 mg/dL   HDL 43.20 >39.00 mg/dL   VLDL 48.6 (H) 0.0 - 40.0 mg/dL   Total CHOL/HDL Ratio 5    NonHDL 155.11   Vitamin B12  Result Value Ref Range   Vitamin B-12 338 211 - 911 pg/mL  Hemoglobin A1c  Result Value Ref Range   Hgb A1c MFr Bld 5.8 4.6 - 6.5 %  TSH  Result Value Ref Range   TSH 0.71 0.35 - 4.50 uIU/mL  LDL cholesterol, direct  Result Value Ref Range   Direct LDL 124.0 mg/dL    Assessment and Plan   Andrea [name] was seen today for establish care.  Diagnoses and all orders for this visit:  Acquired hypothyroidism -     T4, free -     Thyroid Peroxidase Antibodies (TPO) (REFL) -     TSH  Cough -     CBC with Differential/Platelet -     Comprehensive metabolic panel -     DG Chest 2 View; Future  Vitamin B 12 deficiency -     Vitamin B12  Other fatigue -  Hemoglobin A1c  Exposure to SARS-associated coronavirus -     SAR CoV2 Serology (COVID 19)AB(IGG)IA  MDD (major depressive disorder), recurrent severe, without psychosis ([place])  Vitamin D deficiency  Pure hypercholesterolemia -     Lipid panel -     LDL cholesterol, direct    . Orders and follow up as documented in [facility], reviewed diet, exercise and weight control, cardiovascular risk and specific lipid/LDL goals reviewed, reviewed medications and side effects in detail.  . Reviewed expectations re: course of current medical issues. . Outlined signs and symptoms indicating need for more acute intervention. . Patient verbalized understanding and all questions were answered. . Patient received an After Visit Summary.  CMA  served as Education administrator during this visit. History, Physical, and Plan performed by medical provider. The above documentation has been reviewed and is accurate and complete. Briscoe Deutscher, D.O.  Briscoe Deutscher, DO , [place] 12/16/2018  Records requested if needed. Time spent: 30 minutes, of which >50% was spent in obtaining information about her symptoms, reviewing her previous labs, evaluations, and treatments, counseling her about her condition (please see the discussed topics above), and developing a plan to further investigate it; she had a number of questions which I addressed.

## 2018-12-14 ENCOUNTER — Ambulatory Visit (INDEPENDENT_AMBULATORY_CARE_PROVIDER_SITE_OTHER): Payer: Medicare HMO

## 2018-12-14 ENCOUNTER — Other Ambulatory Visit: Payer: Self-pay

## 2018-12-14 ENCOUNTER — Encounter: Payer: Self-pay | Admitting: Family Medicine

## 2018-12-14 ENCOUNTER — Ambulatory Visit (INDEPENDENT_AMBULATORY_CARE_PROVIDER_SITE_OTHER): Payer: Medicare HMO | Admitting: Family Medicine

## 2018-12-14 VITALS — BP 105/80 | HR 61 | Temp 99.2°F | Ht 66.25 in | Wt 227.0 lb

## 2018-12-14 DIAGNOSIS — Z0184 Encounter for antibody response examination: Secondary | ICD-10-CM | POA: Diagnosis not present

## 2018-12-14 DIAGNOSIS — R05 Cough: Secondary | ICD-10-CM

## 2018-12-14 DIAGNOSIS — E538 Deficiency of other specified B group vitamins: Secondary | ICD-10-CM

## 2018-12-14 DIAGNOSIS — R5383 Other fatigue: Secondary | ICD-10-CM | POA: Diagnosis not present

## 2018-12-14 DIAGNOSIS — E559 Vitamin D deficiency, unspecified: Secondary | ICD-10-CM

## 2018-12-14 DIAGNOSIS — E7800 Pure hypercholesterolemia, unspecified: Secondary | ICD-10-CM | POA: Diagnosis not present

## 2018-12-14 DIAGNOSIS — E039 Hypothyroidism, unspecified: Secondary | ICD-10-CM | POA: Diagnosis not present

## 2018-12-14 DIAGNOSIS — R059 Cough, unspecified: Secondary | ICD-10-CM

## 2018-12-14 DIAGNOSIS — R69 Illness, unspecified: Secondary | ICD-10-CM | POA: Diagnosis not present

## 2018-12-14 DIAGNOSIS — F332 Major depressive disorder, recurrent severe without psychotic features: Secondary | ICD-10-CM

## 2018-12-14 DIAGNOSIS — Z20828 Contact with and (suspected) exposure to other viral communicable diseases: Secondary | ICD-10-CM | POA: Diagnosis not present

## 2018-12-14 LAB — CBC WITH DIFFERENTIAL/PLATELET
Basophils Absolute: 0 10*3/uL (ref 0.0–0.1)
Basophils Relative: 0.6 % (ref 0.0–3.0)
Eosinophils Absolute: 0.5 10*3/uL (ref 0.0–0.7)
Eosinophils Relative: 9.1 % — ABNORMAL HIGH (ref 0.0–5.0)
HCT: 41.7 % (ref 36.0–46.0)
Hemoglobin: 13.7 g/dL (ref 12.0–15.0)
Lymphocytes Relative: 36.8 % (ref 12.0–46.0)
Lymphs Abs: 2 10*3/uL (ref 0.7–4.0)
MCHC: 32.9 g/dL (ref 30.0–36.0)
MCV: 90.1 fl (ref 78.0–100.0)
Monocytes Absolute: 0.4 10*3/uL (ref 0.1–1.0)
Monocytes Relative: 6.7 % (ref 3.0–12.0)
Neutro Abs: 2.6 10*3/uL (ref 1.4–7.7)
Neutrophils Relative %: 46.8 % (ref 43.0–77.0)
Platelets: 235 10*3/uL (ref 150.0–400.0)
RBC: 4.62 Mil/uL (ref 3.87–5.11)
RDW: 12.6 % (ref 11.5–15.5)
WBC: 5.5 10*3/uL (ref 4.0–10.5)

## 2018-12-14 LAB — LIPID PANEL
Cholesterol: 198 mg/dL (ref 0–200)
HDL: 43.2 mg/dL (ref >39.00)
NonHDL: 155.11
Total CHOL/HDL Ratio: 5
Triglycerides: 243 mg/dL — ABNORMAL HIGH (ref 0.0–149.0)
VLDL: 48.6 mg/dL — ABNORMAL HIGH (ref 0.0–40.0)

## 2018-12-14 LAB — COMPREHENSIVE METABOLIC PANEL
ALT: 57 U/L — ABNORMAL HIGH (ref 0–35)
AST: 48 U/L — ABNORMAL HIGH (ref 0–37)
Albumin: 4.2 g/dL (ref 3.5–5.2)
Alkaline Phosphatase: 76 U/L (ref 39–117)
BUN: 10 mg/dL (ref 6–23)
CO2: 31 mEq/L (ref 19–32)
Calcium: 10.1 mg/dL (ref 8.4–10.5)
Chloride: 99 mEq/L (ref 96–112)
Creatinine, Ser: 0.91 mg/dL (ref 0.40–1.20)
GFR: 66.92 mL/min (ref >60.00)
Glucose, Bld: 121 mg/dL — ABNORMAL HIGH (ref 70–99)
Potassium: 4.1 mEq/L (ref 3.5–5.1)
Sodium: 138 mEq/L (ref 135–145)
Total Bilirubin: 0.6 mg/dL (ref 0.2–1.2)
Total Protein: 6.5 g/dL (ref 6.0–8.3)

## 2018-12-14 LAB — TSH: TSH: 0.71 u[IU]/mL (ref 0.35–4.50)

## 2018-12-14 LAB — VITAMIN B12: Vitamin B-12: 338 pg/mL (ref 211–911)

## 2018-12-14 LAB — T4, FREE: Free T4: 0.91 ng/dL (ref 0.60–1.60)

## 2018-12-14 LAB — HEMOGLOBIN A1C: Hgb A1c MFr Bld: 5.8 % (ref 4.6–6.5)

## 2018-12-14 LAB — LDL CHOLESTEROL, DIRECT: Direct LDL: 124 mg/dL

## 2018-12-14 NOTE — Patient Instructions (Signed)
Health Maintenance Due  Topic Date Due  . PAP SMEAR-Modifier  02/14/2018  Will discuss during visit.  Depression screen [facility] 2/9 07/06/2018 07/06/2018 09/15/2017  Decreased Interest 3 2 2   Down, Depressed, Hopeless 3 2 2   PHQ - 2 Score 6 4 4   Altered sleeping 3 2 1   Tired, decreased energy 3 2 1   Change in appetite 2 3 2   Feeling bad or failure about yourself  3 3 2   Trouble concentrating 3 3 2   Moving slowly or fidgety/restless 3 1 0  Suicidal thoughts 2 0 0  PHQ-9 Score 25 18 12   Difficult doing work/chores - - -  Some recent data might be hidden

## 2018-12-15 ENCOUNTER — Encounter: Payer: Self-pay | Admitting: Family Medicine

## 2018-12-15 LAB — THYROID PEROXIDASE ANTIBODIES (TPO) (REFL): Thyroperoxidase Ab SerPl-aCnc: 22 IU/mL — ABNORMAL HIGH (ref <9)

## 2018-12-15 LAB — SAR COV2 SEROLOGY (COVID19)AB(IGG),IA: SARS CoV2 AB IGG: NEGATIVE

## 2018-12-16 ENCOUNTER — Other Ambulatory Visit: Payer: Self-pay

## 2018-12-16 ENCOUNTER — Encounter: Payer: Self-pay | Admitting: Family Medicine

## 2018-12-16 ENCOUNTER — Ambulatory Visit (INDEPENDENT_AMBULATORY_CARE_PROVIDER_SITE_OTHER): Payer: Medicare HMO | Admitting: Psychiatry

## 2018-12-16 ENCOUNTER — Encounter (HOSPITAL_COMMUNITY): Payer: Self-pay | Admitting: Psychiatry

## 2018-12-16 DIAGNOSIS — R4589 Other symptoms and signs involving emotional state: Secondary | ICD-10-CM | POA: Diagnosis not present

## 2018-12-16 DIAGNOSIS — F332 Major depressive disorder, recurrent severe without psychotic features: Secondary | ICD-10-CM

## 2018-12-16 DIAGNOSIS — R69 Illness, unspecified: Secondary | ICD-10-CM | POA: Diagnosis not present

## 2018-12-16 DIAGNOSIS — F4310 Post-traumatic stress disorder, unspecified: Secondary | ICD-10-CM

## 2018-12-16 NOTE — Progress Notes (Signed)
Virtual Visit via Video Note  I connected with Andrea [name] on 12/16/18 at  3:00 PM EDT by a video enabled telemedicine application and verified that I am speaking with the correct person using two identifiers.  Location: Patient: Andrea [name] Provider: Lise Auer, LCSW   I discussed the limitations of evaluation and management by telemedicine and the availability of in person appointments. The patient expressed understanding and agreed to proceed.  History of Present Illness: PTSD, MDD and difficulty coping.   Observations/Objective: Counselor met with Andrea [name] for individual therapy via Webex. Counselor assessed MH symptoms and progress on treatment plan goals. Andrea [name] denied suicidal ideation or self-harm behaviors, which is an improvement from past weeks. Andrea [name] reported that her alcohol consumption was none since last session and that she used marijuana once to "numb the pain." Counselor shared additional psychoeducation on the impact of alcohol and mariajuana on mental health. Andrea [name] shared that her mother had made an exteremly inappropriate comment to her in relation to her son's death, that "set her back 5 whole months." Counselor used CBT interventions to process through cognitive coping skills. Counselor gave Andrea [name] homework related to these skills. Counselor highlighted Andrea [name]'s progress and spent time discussing her passions and purpose to help develop her identity. Counselor and Andrea [name] discussed family dynamics and how she can set boundaries and communicate needs. Andrea [name] expressed loss over friendships. Andrea [name] will continue to participate in supplemental services, counseling and medication management.   Assessment and Plan: Counselor will continue to meet with Andrea [name] to address treatment plan goals. Andrea [name] will continue to follow recommendations of providers and implement skills learned in session.  Follow Up Instructions: Counselor will send  information for next session via Webex.     I discussed the assessment and treatment plan with the patient. The patient was provided an opportunity to ask questions and all were answered. The patient agreed with the plan and demonstrated an understanding of the instructions.   The patient was advised to call back or seek an in-person evaluation if the symptoms worsen or if the condition fails to improve as anticipated.  I provided 57 minutes of non-face-to-face time during this encounter.   Lise Auer, LCSW

## 2018-12-17 ENCOUNTER — Telehealth: Payer: Self-pay | Admitting: Family Medicine

## 2018-12-17 DIAGNOSIS — E034 Atrophy of thyroid (acquired): Secondary | ICD-10-CM

## 2018-12-17 NOTE — Telephone Encounter (Signed)
Pt states she is due to pick up her thyroid medication in a couple of days, and she would like to know if the dosage needs to be adjusted based on her labs.  Please have Dr Juleen China or her assistant call pt after reviewing the labs.  Pt states she just does not want to pick up a full month's supply of current dosage if it needs to be changed.    Preferred Pharmacy:  [facility] [place], [place] [phone] (Phone) [phone] (Fax)    Pt: [phone]

## 2018-12-17 NOTE — Telephone Encounter (Signed)
Please advise 

## 2018-12-18 MED ORDER — SYNTHROID 125 MCG PO TABS: ORAL_TABLET | ORAL | 1 refills | Status: DC

## 2018-12-18 NOTE — Telephone Encounter (Signed)
Please advise  Copied from [place] [id number]. Topic: Quick Communication - Lab Results (Clinic Use ONLY) >> Dec 18, 2018  9:11 AM Lennox Solders wrote: Pt is calling and would like tsh results

## 2018-12-18 NOTE — Telephone Encounter (Signed)
Thyroid lab in normal range. Please call in same dose - 90 days, 1 refill.

## 2018-12-18 NOTE — Telephone Encounter (Signed)
Spoke to pt told her thyroid lab is normal range. Continue same dose Synthroid 125 mcg daily. Rx sent to pharmacy. Pt verbalized understanding.

## 2018-12-21 ENCOUNTER — Other Ambulatory Visit: Payer: Self-pay

## 2018-12-21 DIAGNOSIS — E034 Atrophy of thyroid (acquired): Secondary | ICD-10-CM

## 2018-12-24 ENCOUNTER — Encounter (HOSPITAL_COMMUNITY): Payer: Self-pay | Admitting: Psychiatry

## 2018-12-24 ENCOUNTER — Other Ambulatory Visit: Payer: Self-pay

## 2018-12-24 ENCOUNTER — Ambulatory Visit (INDEPENDENT_AMBULATORY_CARE_PROVIDER_SITE_OTHER): Payer: Medicare HMO | Admitting: Psychiatry

## 2018-12-24 DIAGNOSIS — R4589 Other symptoms and signs involving emotional state: Secondary | ICD-10-CM

## 2018-12-24 DIAGNOSIS — F332 Major depressive disorder, recurrent severe without psychotic features: Secondary | ICD-10-CM | POA: Diagnosis not present

## 2018-12-24 DIAGNOSIS — F4310 Post-traumatic stress disorder, unspecified: Secondary | ICD-10-CM | POA: Diagnosis not present

## 2018-12-24 DIAGNOSIS — R69 Illness, unspecified: Secondary | ICD-10-CM | POA: Diagnosis not present

## 2018-12-24 NOTE — Progress Notes (Signed)
Virtual Visit via Video Note  I connected with [name] on 12/24/18 at  8:00 AM EDT by a video enabled telemedicine application and verified that I am speaking with the correct person using two identifiers.  Location: Patient: Andrea [name] Provider: Lise Auer, LCSW   I discussed the limitations of evaluation and management by telemedicine and the availability of in person appointments. The patient expressed understanding and agreed to proceed.  History of Present Illness: MDD, PTSD, difficulty coping   Observations/Objective: Counselor met with [name] for individual therapy via Webex. Counselor assessed MH symptoms and progress on treatment plan goals. Amyra denied suicidal ideation or self-harm behaviors. Sidni shared that today was her dads first day back to work, which has caused her anxiety because he is so supportive for her recovery. Counselor identified coping skills and strategies she can utilize to decrease anxiety and to fill her day in a healthy way. Kytzia reports that she remains triggered by pairing knives in relation to the day her son passed. Counselor discussed safety measures to eliminate safety concerns. Kamarah shared that she has been doing her daily walks at 4 or 5 in the morning to avoid being around children/families, as that triggers her as well. Tascha reports knowing that walking in the dark is unsafe and that she is ok with that because she does not fear pain or death. Counselor explored thought process with her and safety concerns. Counselor and [name] reviewed therapy homework and discussed cognitive coping skills around negative outside messages from friends and family, vs her reality and truth. Kikuye reports that she remains engaged in supportive services and we discussed challenges in a specific homework assignment that has been triggering for her. Counselor and [name] wrapped up by discussing plans for future session and  praising her for the work and progress she continues to make.  Assessment and Plan: Counselor will continue to meet with patient to address treatment plan goals. Patient will continue to follow recommendations of providers and implement skills learned in session.  Follow Up Instructions: Counselor will send information for next session via Webex.     I discussed the assessment and treatment plan with the patient. The patient was provided an opportunity to ask questions and all were answered. The patient agreed with the plan and demonstrated an understanding of the instructions.   The patient was advised to call back or seek an in-person evaluation if the symptoms worsen or if the condition fails to improve as anticipated.  I provided 50 minutes of non-face-to-face time during this encounter.   Lise Auer, LCSW

## 2019-01-06 ENCOUNTER — Ambulatory Visit (INDEPENDENT_AMBULATORY_CARE_PROVIDER_SITE_OTHER): Payer: Medicare HMO | Admitting: Psychiatry

## 2019-01-06 ENCOUNTER — Other Ambulatory Visit: Payer: Self-pay

## 2019-01-06 ENCOUNTER — Encounter (HOSPITAL_COMMUNITY): Payer: Self-pay | Admitting: Psychiatry

## 2019-01-06 DIAGNOSIS — F332 Major depressive disorder, recurrent severe without psychotic features: Secondary | ICD-10-CM | POA: Diagnosis not present

## 2019-01-06 DIAGNOSIS — R69 Illness, unspecified: Secondary | ICD-10-CM | POA: Diagnosis not present

## 2019-01-06 DIAGNOSIS — F4310 Post-traumatic stress disorder, unspecified: Secondary | ICD-10-CM | POA: Diagnosis not present

## 2019-01-06 NOTE — Progress Notes (Signed)
Virtual Visit via Video Note  I connected with [name] on 01/06/19 at  4:00 PM EDT by a video enabled telemedicine application and verified that I am speaking with the correct person using two identifiers.  Location: Patient: Andrea [name] Provider: Lise Auer, LCSW   I discussed the limitations of evaluation and management by telemedicine and the availability of in person appointments. The patient expressed understanding and agreed to proceed.  History of Present Illness: PTSD and MDD   Observations/Objective: Counselor met with [name] for individual therapy via Webex. Counselor assessed MH symptoms and progress on treatment plan goals. Neriah denied suicidal ideation or self-harm behaviors, however on one difficult day she "prayed to God that her heart would stop beating in her sleep." Counselor explored the meaning behind this expression and discussed patterns in her thinking from the past 6 months that parallel this request. Ariele shared that she has been very triggered by seeing children and families, as well as back to school supplies, as this reminds her of her late son. Counselor explored coping strategies in handling these triggers in a way that doesn't evoke a panic attack or other distressing reactions. Counselor and Huldah explore her avoidance of support groups over the past week and a plan to reengage in those. Counselor processed her relationship with her older son and the impact of the dynamics on her recovery. Counselor assessed safety and substance use, with no current concerns.   Assessment and Plan: Counselor will continue to meet with patient to address treatment plan goals. Patient will continue to follow recommendations of providers and implement skills learned in session.  Follow Up Instructions: Counselor will send information for next session via Webex.     I discussed the assessment and treatment plan with the patient. The patient was  provided an opportunity to ask questions and all were answered. The patient agreed with the plan and demonstrated an understanding of the instructions.   The patient was advised to call back or seek an in-person evaluation if the symptoms worsen or if the condition fails to improve as anticipated.  I provided 60 minutes of non-face-to-face time during this encounter.   Lise Auer, LCSW

## 2019-01-07 ENCOUNTER — Ambulatory Visit (HOSPITAL_COMMUNITY): Payer: Medicare HMO | Admitting: Psychiatry

## 2019-01-08 ENCOUNTER — Other Ambulatory Visit: Payer: Self-pay

## 2019-01-08 ENCOUNTER — Ambulatory Visit
Admission: RE | Admit: 2019-01-08 | Discharge: 2019-01-08 | Disposition: A | Discharge status: Home / Self Care | Payer: Medicare HMO | Source: Ambulatory Visit | Attending: Anesthesiology | Admitting: Anesthesiology

## 2019-01-08 DIAGNOSIS — M48061 Spinal stenosis, lumbar region without neurogenic claudication: Secondary | ICD-10-CM | POA: Diagnosis not present

## 2019-01-08 DIAGNOSIS — M4726 Other spondylosis with radiculopathy, lumbar region: Secondary | ICD-10-CM

## 2019-01-12 ENCOUNTER — Ambulatory Visit (INDEPENDENT_AMBULATORY_CARE_PROVIDER_SITE_OTHER): Payer: Medicare HMO | Admitting: Psychiatry

## 2019-01-12 ENCOUNTER — Other Ambulatory Visit: Payer: Self-pay

## 2019-01-12 DIAGNOSIS — F332 Major depressive disorder, recurrent severe without psychotic features: Secondary | ICD-10-CM

## 2019-01-12 DIAGNOSIS — F4310 Post-traumatic stress disorder, unspecified: Secondary | ICD-10-CM | POA: Diagnosis not present

## 2019-01-12 DIAGNOSIS — R69 Illness, unspecified: Secondary | ICD-10-CM | POA: Diagnosis not present

## 2019-01-12 NOTE — Progress Notes (Signed)
Virtual Visit via Video Note  I connected with Andrea [name] on 01/12/19 at  2:00 PM EDT by a video enabled telemedicine application and verified that I am speaking with the correct person using two identifiers.  Location: Patient: Andrea [name] Provider: Lise Auer, LCSW   I discussed the limitations of evaluation and management by telemedicine and the availability of in person appointments. The patient expressed understanding and agreed to proceed.  History of Present Illness: PTSD and MDD   Observations/Objective: Counselor met with Andrea [name] for individual therapy via Webex. Counselor assessed MH symptoms and progress on treatment plan goals. Kayonna denied suicidal ideation or self-harm behaviors, but indorsed thoughts of wanting to harm self with sharp object. Solita shared that she continues to be very triggered by kid and school related interactions, as well as pairing knives. Counselor processed safety considerations and coping skills to use when triggered. Jood shared about a time when she was triggered in public and her dad had to remove her from the store. She continued being upset and demanded he let her out of the car on a high traffic road. She walked along the road for a while, cooled down then got back in the car with him. Counselor assessed her thought process and emotions in this incident and we discussed alternative ways to handle situation. Counselor and Andrea [name] brainstormed adult outlets for her to healthily express her anger/frustrations/grief, such as the gym or a "Rage Room", and finding places to spend time with other adults outside of support groups. Counselor to send some resources for her to look into.   Assessment and Plan: Counselor will continue to meet with patient to address treatment plan goals. Patient will continue to follow recommendations of providers and implement skills learned in session.  Follow Up Instructions: Counselor will send  information for next session via Webex.     I discussed the assessment and treatment plan with the patient. The patient was provided an opportunity to ask questions and all were answered. The patient agreed with the plan and demonstrated an understanding of the instructions.   The patient was advised to call back or seek an in-person evaluation if the symptoms worsen or if the condition fails to improve as anticipated.  I provided 60 minutes of non-face-to-face time during this encounter.   Lise Auer, LCSW

## 2019-01-13 ENCOUNTER — Ambulatory Visit: Payer: Medicare HMO | Admitting: Family Medicine

## 2019-01-13 ENCOUNTER — Encounter (HOSPITAL_COMMUNITY): Payer: Self-pay | Admitting: Psychiatry

## 2019-01-19 ENCOUNTER — Ambulatory Visit (HOSPITAL_COMMUNITY): Payer: Medicare HMO | Admitting: Psychiatry

## 2019-01-21 ENCOUNTER — Other Ambulatory Visit: Payer: Self-pay

## 2019-01-21 ENCOUNTER — Encounter (HOSPITAL_COMMUNITY): Payer: Self-pay | Admitting: Psychiatry

## 2019-01-21 ENCOUNTER — Ambulatory Visit (INDEPENDENT_AMBULATORY_CARE_PROVIDER_SITE_OTHER): Payer: Medicare HMO | Admitting: Psychiatry

## 2019-01-21 DIAGNOSIS — F332 Major depressive disorder, recurrent severe without psychotic features: Secondary | ICD-10-CM | POA: Diagnosis not present

## 2019-01-21 DIAGNOSIS — R69 Illness, unspecified: Secondary | ICD-10-CM | POA: Diagnosis not present

## 2019-01-21 DIAGNOSIS — F4310 Post-traumatic stress disorder, unspecified: Secondary | ICD-10-CM | POA: Diagnosis not present

## 2019-01-21 NOTE — Progress Notes (Signed)
Virtual Visit via Video Note  I connected with Andrea [name] on 01/21/19 at  2:00 PM EDT by a video enabled telemedicine application and verified that I am speaking with the correct person using two identifiers.  Location: Patient: Andrea [name] Provider: Lise Auer, LCSW   I discussed the limitations of evaluation and management by telemedicine and the availability of in person appointments. The patient expressed understanding and agreed to proceed.  History of Present Illness: PTSD and MDD   Observations/Objective: Counselor met with Andrea [name] for individual therapy via Webex. Counselor assessed MH symptoms and progress on treatment plan goals. Andrea [name] denied suicidal ideation or self-harm behaviors. Andrea [name] shared that she has been stretching herself to be in uncomfortable situations life shopping with her dad, meeting a friend for coffee and attending her oldest son's church. Counselor celebrated her ability to take on these activities despite her high anxiety and likelihood of being triggers. Andrea [name] shared that they were all emotionally draining situations that put her on emotional overdrive once she returned home, but acknowledged that they were small successes. Counselor explored her current daily functioning and we processed upcoming dates that she anticipates will be difficult without her deceased son. Andrea [name] would like to be more prepared for these dates to avoid hospitalization again like on his birthday in June. Counselor and Andrea [name] processed the changes in her living sons behaviors towards her and her understanding that it will take time for him to grieve and heal as well. Andrea [name] stated that she is realizing more and more that she wants to want to live again. Counselor explored ways that she has started allowing herself to "live again'". We closed by discussing alternative treatments like brain spotting, EMDR and her interest in hypnotherapy.    Assessment and  Plan: Counselor will continue to meet with patient to address treatment plan goals. Patient will continue to follow recommendations of providers and implement skills learned in session.  Follow Up Instructions: Counselor will send information for next session via Webex.     I discussed the assessment and treatment plan with the patient. The patient was provided an opportunity to ask questions and all were answered. The patient agreed with the plan and demonstrated an understanding of the instructions.   The patient was advised to call back or seek an in-person evaluation if the symptoms worsen or if the condition fails to improve as anticipated.  I provided 60 minutes of non-face-to-face time during this encounter.   Lise Auer, LCSW

## 2019-01-26 NOTE — Progress Notes (Deleted)
Subjective:    Andrea [name] is a 43 y.o. female and is here for a comprehensive physical exam.   Current Outpatient Medications:  .  ALPRAZolam (XANAX XR) 2 MG 24 hr tablet, Take 1 tablet (2 mg total) by mouth daily. For anxiety, Disp: 5 tablet, Rfl: 0 .  ALPRAZolam (XANAX) 0.5 MG tablet, Take 1 tablet (0.5 mg total) by mouth 2 (two) times daily as needed for anxiety., Disp: 10 tablet, Rfl: 0 .  ARIPiprazole (ABILIFY) 5 MG tablet, Take 1 tablet (5 mg total) by mouth daily., Disp: 30 tablet, Rfl: 0 .  aspirin EC 81 MG tablet, Take 81 mg by mouth daily., Disp: , Rfl:  .  colestipol (COLESTID) 1 g tablet, TAKE 2 TABLETS(2 GRAMS) BY MOUTH TWICE DAILY, Disp: 120 tablet, Rfl: 1 .  ergocalciferol (VITAMIN D2) 1.25 MG (50000 UT) capsule, Take 50,000 Units by mouth once a week., Disp: , Rfl:  .  esomeprazole (NEXIUM) 40 MG capsule, Take 1 capsule (40 mg total) by mouth daily. For acid reflux, Disp: 5 capsule, Rfl: 0 .  FLUoxetine (PROZAC) 40 MG capsule, Take 1 capsule (40 mg total) by mouth daily., Disp: 30 capsule, Rfl: 0 .  metoprolol tartrate (LOPRESSOR) 50 MG tablet, Take 1 tablet (50 mg total) by mouth 2 (two) times daily., Disp: 60 tablet, Rfl: 0 .  prazosin (MINIPRESS) 1 MG capsule, Take 1 capsule (1 mg total) by mouth at bedtime., Disp: 30 capsule, Rfl: 0 .  rosuvastatin (CRESTOR) 20 MG tablet, Take one tablet by mouth once daily: For high cholesterol, Disp: 5 tablet, Rfl: 0 .  SYNTHROID 125 MCG tablet, TAKE 1 TABLET(125 MCG) BY MOUTH DAILY BEFORE BREAKFAST: For thyroid hormone replacement, Disp: 90 tablet, Rfl: 1 .  traZODone (DESYREL) 50 MG tablet, Take 1 tablet (50 mg total) by mouth at bedtime as needed for sleep., Disp: 30 tablet, Rfl: 0  Health Maintenance Due  Topic Date Due  . PAP SMEAR-Modifier  02/14/2018  . INFLUENZA VACCINE  12/19/2018    PMHx, SurgHx, SocialHx, Medications, and Allergies were reviewed in the Visit Navigator and updated as appropriate.   Past Medical  History:  Diagnosis Date  . Anxiety   . Arthritis    ruptured lumbar disc-careful with positioning  . Blood dyscrasia    protein s deficiency-no treatment since 2006  . Depression   . Diabetes mellitus without complication ([place])   . GERD (gastroesophageal reflux disease)   . Headache(784.0)   . Hemorrhoids   . High cholesterol   . History of fainting spells of unknown cause   . Hyperlipemia   . Hypertension   . Hypothyroidism   . IBS (irritable bowel syndrome)   . MVP (mitral valve prolapse)    echo per dr Dagmar Hait  . Protein S deficiency ([place]) 2003   dvt/pulmonary embolus-on coumadin for 6 months then off-Sees [facility]  . Pulmonary embolism ([place])   . PVC (premature ventricular contraction)      Past Surgical History:  Procedure Laterality Date  . CESAREAN SECTION  2006  . CHOLECYSTECTOMY N/A 11/03/2014   Procedure: LAPAROSCOPIC CHOLECYSTECTOMY WITH INTRAOPERATIVE CHOLANGIOGRAM;  Surgeon: Georganna Skeans, MD;  Location: [facility];  Service: General;  Laterality: N/A;  . LAPAROSCOPIC ASSISTED VAGINAL HYSTERECTOMY  03/27/2011   Procedure: LAPAROSCOPIC ASSISTED VAGINAL HYSTERECTOMY;  Surgeon: Cyril Mourning, MD;  Location: [facility];  Service: Gynecology;  Laterality: N/A;  . SALPINGOOPHORECTOMY  03/27/2011   Procedure: SALPINGO OOPHERECTOMY;  Surgeon: Cyril Mourning, MD;  Location: [facility];  Service: Gynecology;  Laterality: Bilateral;  . TONSILLECTOMY  92  . vaginal reconstructive surgery  2001     Family History  Problem Relation Age of Onset  . High blood pressure Mother   . Anxiety disorder Mother   . Depression Mother   . OCD Mother   . Physical abuse Mother   . Alcohol abuse Mother   . Hyperlipidemia Father        fathers side of family  . High blood pressure Father   . Hypertension Father   . ADD / ADHD Son   . Seizures Son   . ADD / ADHD Son   . Hypertension Other        entire family on both sides  . Asthma Son   . Asthma Son   . Clotting disorder  Maternal Uncle   . Clotting disorder Paternal Grandmother   . Heart disease Maternal Grandfather   . Alcohol abuse Maternal Grandfather   . Anxiety disorder Maternal Grandmother     Social History   Tobacco Use  . Smoking status: Former Smoker    Packs/day: 1.00    Years: 15.00    Pack years: 15.00    Types: Cigarettes, E-cigarettes    Quit date: 03/18/2004    Years since quitting: 14.8  . Smokeless tobacco: Never Used  . Tobacco comment: States she vaps several times a weeks but no nicotine in the ones she uses  Substance Use Topics  . Alcohol use: Never    Frequency: Never  . Drug use: No    Review of Systems:   Pertinent items are noted in the HPI. Otherwise, ROS is negative.  Objective:   LMP 03/19/2011    General appearance: alert, cooperative and appears stated age. Head: normocephalic, without obvious abnormality, atraumatic. Neck: no adenopathy, supple, symmetrical, trachea midline; thyroid not enlarged, symmetric, no tenderness/mass/nodules. Lungs: clear to auscultation bilaterally. Breasts: inspection negative, no nipple retraction or dimpling, no nipple discharge or bleeding, no axillary or supraclavicular adenopathy, normal to palpation without dominant masses. Heart: regular rate and rhythm Abdomen: soft, non-tender; no masses,  no organomegaly. Extremities: extremities normal, atraumatic, no cyanosis or edema. Skin: skin color, texture, turgor normal, no rashes or lesions. Lymph: cervical, supraclavicular, and axillary nodes normal; no abnormal inguinal nodes palpated. Neurologic: grossly normal.  Pelvic:  External genitalia: no lesions. Urethra: normal appearing urethra with no masses, tenderness or lesions. Bartholins and Skenes: normal. Vagina: normal appearing vagina with normal color and discharge, no lesions. Cervix: normal appearance. Pap and high risk HPV testing done: {yes no:[id number]} Bimanual Exam:   Uterus: uterus is normal size, shape, consistency  and nontender. Adnexa: normal adnexa in size, nontender and no masses.  Assessment/Plan:   There are no diagnoses linked to this encounter.  Patient Counseling:   [x]     Nutrition: Stressed importance of moderation in sodium/caffeine intake, saturated fat and cholesterol, caloric balance, sufficient intake of fresh fruits, vegetables, fiber, calcium, iron, and 1 mg of folate supplement per day (for females capable of pregnancy).   [x]      Stressed the importance of regular exercise.    [x]     Substance Abuse: Discussed cessation/primary prevention of tobacco, alcohol, or other drug use; driving or other dangerous activities under the influence; availability of treatment for abuse.    [x]      Injury prevention: Discussed safety belts, safety helmets, smoke detector, smoking near bedding or upholstery.    [x]      Sexuality:  Discussed sexually transmitted diseases, partner selection, use of condoms, avoidance of unintended pregnancy  and contraceptive alternatives.    [x]     Dental health: Discussed importance of regular tooth brushing, flossing, and dental visits.   [x]      Health maintenance and immunizations reviewed. Please refer to Health maintenance section.   Briscoe Deutscher, DO [place]

## 2019-01-27 ENCOUNTER — Encounter: Payer: Medicare HMO | Admitting: Family Medicine

## 2019-01-27 ENCOUNTER — Telehealth: Payer: Self-pay | Admitting: Family Medicine

## 2019-01-27 NOTE — Telephone Encounter (Signed)
Patient Andrea [name] called our office at 10:49 and said the patient was running late and construction was in the way and she was upset. Hailey advised of the no show/ late cancellation policy. Hailey took the call and attempted to walk the patient through next steps of rescheduling, the patient said she got turned around and was now at [employer] and construction was refusing to move. As Hailey continued to encourage the patient to reschedule, the patient was adamant about being seen today for her physical and only wanting Dr. Juleen China and that it was not her fault. Hailey spoke with the patient for 12 min and tried to resolve this by rescheduling and directing the patient at the same time. Hailey placed the call on hold and came to [facility] and I to assist as it was an overwhelming phone call and she could not resolve the issue for the patient.   I introduced myself to the patient and listened as she explained that construction would not let her into the parking lot. She said she was in the parking lot now. She said that Hailey was directing her how to get in and that she was going to "go after the construction workers and let them have it."  I stated that I suggest she "not go after them and that the construction workers are not tied to [employer] it's the city." I stated that "we understand inconvienences happen such as traffic, Architect, etc which is why we have the 10 min late policy and encourage patient's to get here early." She said that "she is about to get them" and I said "they may call 911 so I highly suggest against it and let's focus on rescheduling the appointment." I checked the schedule and spoke with JoEllen who approved to see her in office at 3:20pm Monday. I explained to JoEllen that due to the severity of the call and patient need, to please make the accomodation so we can move forward.   I rescheduled the appointment and told the patient to "be here at 3pm Monday to allow for  plenty of time for construction." She began to get very overwhelmed and saying that she does not want to be a no show and it was not her fault and kept repeating this statement. At this point, I told her that "we would waive the $50 fee for her and just cancel the appointment for this visit so we can move forward." The patient has not had previous no show appointments.   The patient then began yelling and saying "it's not her fault and that the cops were at her house yesterday and it's all too much." I told her that "it seems that she needs clinical help and I would like her to speak with a triage CMA or nurse here at the office." She said "she just needs to end the call." I told her that "she does not seem stable and I want to help her but she has to agree to speak with someone." She declined. I told her that "if she gets worse with her stability she needs to call 911 or go to the emergency room." She said "yes, she will probably end up there." I again said "that's not what we want and that we want to help but she needs clinical advise." She declined again.   I asked the patient "what else can I do moving forward for her?" She said "she just needed to end the call." I said "okay and  told her to send Dr. Juleen China a mychart message or call if needed." She said "she was ending the call" and I agreed I was as well. The call ended.  I am including Lea in case this situation escalates further.

## 2019-01-27 NOTE — Telephone Encounter (Signed)
Read and appreciate note. Patient followed by Psychiatry and Psychology. Will reassure her at upcoming visit that missed appointment okay.

## 2019-01-28 DIAGNOSIS — Z79891 Long term (current) use of opiate analgesic: Secondary | ICD-10-CM | POA: Diagnosis not present

## 2019-01-28 DIAGNOSIS — G894 Chronic pain syndrome: Secondary | ICD-10-CM | POA: Diagnosis not present

## 2019-01-28 DIAGNOSIS — M5416 Radiculopathy, lumbar region: Secondary | ICD-10-CM | POA: Diagnosis not present

## 2019-01-28 DIAGNOSIS — M4726 Other spondylosis with radiculopathy, lumbar region: Secondary | ICD-10-CM | POA: Diagnosis not present

## 2019-01-31 NOTE — Progress Notes (Signed)
Subjective:    Andrea [name] is a 43 y.o. female and is here for a comprehensive physical exam.  There are no preventive care reminders to display for this patient.  Current Outpatient Medications:  .  ALPRAZolam (XANAX XR) 2 MG 24 hr tablet, Take 1 tablet (2 mg total) by mouth daily. For anxiety, Disp: 5 tablet, Rfl: 0 .  ALPRAZolam (XANAX) 0.5 MG tablet, Take 1 tablet (0.5 mg total) by mouth 2 (two) times daily as needed for anxiety., Disp: 10 tablet, Rfl: 0 .  ARIPiprazole (ABILIFY) 5 MG tablet, Take 1 tablet (5 mg total) by mouth daily., Disp: 30 tablet, Rfl: 0 .  aspirin EC 81 MG tablet, Take 81 mg by mouth daily., Disp: , Rfl:  .  colestipol (COLESTID) 1 g tablet, TAKE 2 TABLETS(2 GRAMS) BY MOUTH TWICE DAILY, Disp: 120 tablet, Rfl: 1 .  ergocalciferol (VITAMIN D2) 1.25 MG (50000 UT) capsule, Take 50,000 Units by mouth once a week., Disp: , Rfl:  .  esomeprazole (NEXIUM) 40 MG capsule, Take 1 capsule (40 mg total) by mouth daily. For acid reflux, Disp: 5 capsule, Rfl: 0 .  FLUoxetine (PROZAC) 40 MG capsule, Take 1 capsule (40 mg total) by mouth daily., Disp: 30 capsule, Rfl: 0 .  metoprolol tartrate (LOPRESSOR) 50 MG tablet, Take 1 tablet (50 mg total) by mouth 2 (two) times daily., Disp: 60 tablet, Rfl: 3 .  prazosin (MINIPRESS) 1 MG capsule, Take 1 capsule (1 mg total) by mouth at bedtime., Disp: 30 capsule, Rfl: 0 .  rosuvastatin (CRESTOR) 20 MG tablet, Take one tablet by mouth once daily: For high cholesterol, Disp: 5 tablet, Rfl: 0 .  SYNTHROID 125 MCG tablet, TAKE 1 TABLET(125 MCG) BY MOUTH DAILY BEFORE BREAKFAST: For thyroid hormone replacement, Disp: 90 tablet, Rfl: 1 .  traZODone (DESYREL) 50 MG tablet, Take 1 tablet (50 mg total) by mouth at bedtime as needed for sleep. (Patient taking differently: Take 100 mg by mouth at bedtime as needed for sleep. ), Disp: 30 tablet, Rfl: 0 .  ezetimibe (ZETIA) 10 MG tablet, Take 1 tablet (10 mg total) by mouth daily., Disp: 90 tablet,  Rfl: 3 .  fluticasone (FLONASE) 50 MCG/ACT nasal spray, Place 2 sprays into both nostrils daily., Disp: 16 g, Rfl: 6 .  ondansetron (ZOFRAN) 4 MG tablet, Take 1 tablet (4 mg total) by mouth every 8 (eight) hours as needed for nausea or vomiting., Disp: 20 tablet, Rfl: 3 .  oxyCODONE-acetaminophen (PERCOCET/ROXICET) 5-325 MG tablet, Take 1 tablet by mouth every 6 (six) hours as needed for severe pain., Disp: , Rfl:   PMHx, SurgHx, SocialHx, Medications, and Allergies were reviewed in the Visit Navigator and updated as appropriate.   Past Medical History:  Diagnosis Date  . Anxiety   . Arthritis    ruptured lumbar disc-careful with positioning  . Blood dyscrasia    protein s deficiency-no treatment since 2006  . Depression   . Diabetes mellitus without complication ([place])   . GERD (gastroesophageal reflux disease)   . Headache(784.0)   . Hemorrhoids   . High cholesterol   . History of fainting spells of unknown cause   . Hyperlipemia   . Hypertension   . Hypothyroidism   . IBS (irritable bowel syndrome)   . MVP (mitral valve prolapse)    echo per dr Dagmar Hait  . Protein S deficiency ([place]) 2003   dvt/pulmonary embolus-on coumadin for 6 months then off-Sees [facility]  . Pulmonary embolism ([place])   .  PVC (premature ventricular contraction)      Past Surgical History:  Procedure Laterality Date  . CESAREAN SECTION  2006  . CHOLECYSTECTOMY N/A 11/03/2014   Procedure: LAPAROSCOPIC CHOLECYSTECTOMY WITH INTRAOPERATIVE CHOLANGIOGRAM;  Surgeon: Georganna Skeans, MD;  Location: [facility];  Service: General;  Laterality: N/A;  . LAPAROSCOPIC ASSISTED VAGINAL HYSTERECTOMY  03/27/2011   Procedure: LAPAROSCOPIC ASSISTED VAGINAL HYSTERECTOMY;  Surgeon: Cyril Mourning, MD;  Location: [facility];  Service: Gynecology;  Laterality: N/A;  . SALPINGOOPHORECTOMY  03/27/2011   Procedure: SALPINGO OOPHERECTOMY;  Surgeon: Cyril Mourning, MD;  Location: [facility];  Service: Gynecology;  Laterality: Bilateral;    . TONSILLECTOMY  92  . VAGINAL RECONSTRUCTIVE SURGERY  2001     Family History  Problem Relation Age of Onset  . High blood pressure Mother   . Anxiety disorder Mother   . Depression Mother   . OCD Mother   . Physical abuse Mother   . Alcohol abuse Mother   . Hyperlipidemia Father   . High blood pressure Father   . Hypertension Father   . ADD / ADHD Son   . Seizures Son   . ADD / ADHD Son   . Hypertension Other        entire family on both sides  . Asthma Son   . Asthma Son   . Clotting disorder Maternal Uncle   . Clotting disorder Paternal Grandmother   . Heart disease Maternal Grandfather   . Alcohol abuse Maternal Grandfather   . Anxiety disorder Maternal Grandmother     Social History   Tobacco Use  . Smoking status: Former Smoker    Packs/day: 1.00    Years: 15.00    Pack years: 15.00    Types: Cigarettes, E-cigarettes    Quit date: 03/18/2004    Years since quitting: 14.9  . Smokeless tobacco: Never Used  . Tobacco comment: States she vaps several times a weeks but no nicotine in the ones she uses  Substance Use Topics  . Alcohol use: Never    Frequency: Never  . Drug use: No    Review of Systems:   Pertinent items are noted in the HPI. Otherwise, ROS is negative.  Objective:   BP (!) 150/90   Pulse 82   Temp 98.2 F (36.8 C) (Temporal)   Ht 5\' 9"  (1.753 m)   Wt 224 lb 6.4 oz (101.8 kg)   LMP 03/19/2011   SpO2 96%   BMI 33.14 kg/m   General appearance: alert, cooperative and appears stated age. Head: normocephalic, without obvious abnormality, atraumatic. Neck: no adenopathy, supple, symmetrical, trachea midline; thyroid not enlarged, symmetric, no tenderness/mass/nodules. Lungs: clear to auscultation bilaterally. Heart: regular rate and rhythm Abdomen: soft, non-tender; no masses,  no organomegaly. Extremities: extremities normal, atraumatic, no cyanosis or edema. Skin: skin color, texture, turgor normal, no rashes or lesions. Lymph:  cervical, supraclavicular, and axillary nodes normal; no abnormal inguinal nodes palpated. Neurologic: grossly normal.  Results for orders placed or performed in visit on 02/01/19  Comprehensive metabolic panel  Result Value Ref Range   Sodium 140 135 - 145 mEq/L   Potassium 4.3 3.5 - 5.1 mEq/L   Chloride 102 96 - 112 mEq/L   CO2 26 19 - 32 mEq/L   Glucose, Bld 97 70 - 99 mg/dL   BUN 7 6 - 23 mg/dL   Creatinine, Ser 0.83 0.40 - 1.20 mg/dL   Total Bilirubin 0.6 0.2 - 1.2 mg/dL   Alkaline Phosphatase 81 39 -  117 U/L   AST 43 (H) 0 - 37 U/L   ALT 57 (H) 0 - 35 U/L   Total Protein 6.6 6.0 - 8.3 g/dL   Albumin 4.3 3.5 - 5.2 g/dL   Calcium 10.0 8.4 - 10.5 mg/dL   GFR 74.38 >60.00 mL/min                             Assessment/Plan:   Sunita was seen today for annual exam.  Diagnoses and all orders for this visit:  Routine physical examination  Elevated LFTs -     Comprehensive metabolic panel  Thyroid nodule Comments: Hx of left thyroid nodule.  Orders: -     Cancel: US Soft Tissue Head/Neck; Future  Dysphagia, unspecified type -     Ambulatory referral to Gastroenterology  Postmenopausal -     Ambulatory referral to Gynecology  Hyperlipidemia LDL goal <100 -     ezetimibe (ZETIA) 10 MG tablet; Take 1 tablet (10 mg total) by mouth daily.  PTSD (post-traumatic stress disorder)  Irritable bowel syndrome with diarrhea  Hypothyroidism due to Hashimoto's thyroiditis  Gastroesophageal reflux disease, on Nexium  Dysfunction of right eustachian tube -     fluticasone (FLONASE) 50 MCG/ACT nasal spray; Place 2 sprays into both nostrils daily.  Chronic low back pain, followed by Pain Management, Rx Percocet  Essential hypertension -     metoprolol tartrate (LOPRESSOR) 50 MG tablet; Take 1 tablet (50 mg total) by mouth 2 (two) times daily.  Mild nausea, intermittent, associated with vertigo -     ondansetron (ZOFRAN) 4 MG tablet; Take 1 tablet (4 mg total) by mouth  every 8 (eight) hours as needed for nausea or vomiting.  Need for immunization against influenza -     Flu Vaccine QUAD 36+ mos IM  MDD (major depressive disorder), recurrent severe, without psychosis ([place]) Comments: Always suicidal. Safety contracts in place. She is followed by Dr. Toy Care and several therapists/groups. Very vocal about SI and when she needs inpt.    Patient Counseling: [x]    Nutrition: Stressed importance of moderation in sodium/caffeine intake, saturated fat and cholesterol, caloric balance, sufficient intake of fresh fruits, vegetables, fiber, calcium, iron, and 1 mg of folate supplement per day (for females capable of pregnancy).  [x]    Stressed the importance of regular exercise.   [x]    Substance Abuse: Discussed cessation/primary prevention of tobacco, alcohol, or other drug use; driving or other dangerous activities under the influence; availability of treatment for abuse.   [x]    Injury prevention: Discussed safety belts, safety helmets, smoke detector, smoking near bedding or upholstery.   [x]    Sexuality: Discussed sexually transmitted diseases, partner selection, use of condoms, avoidance of unintended pregnancy  and contraceptive alternatives.  [x]    Dental health: Discussed importance of regular tooth brushing, flossing, and dental visits.  [x]    Health maintenance and immunizations reviewed. Please refer to Health maintenance section.   Briscoe Deutscher, DO [place]

## 2019-02-01 ENCOUNTER — Ambulatory Visit (INDEPENDENT_AMBULATORY_CARE_PROVIDER_SITE_OTHER): Payer: Medicare HMO | Admitting: Family Medicine

## 2019-02-01 ENCOUNTER — Other Ambulatory Visit: Payer: Self-pay

## 2019-02-01 ENCOUNTER — Encounter: Payer: Self-pay | Admitting: Family Medicine

## 2019-02-01 VITALS — BP 150/90 | HR 82 | Temp 98.2°F | Ht 69.0 in | Wt 224.4 lb

## 2019-02-01 DIAGNOSIS — Z23 Encounter for immunization: Secondary | ICD-10-CM

## 2019-02-01 DIAGNOSIS — E063 Autoimmune thyroiditis: Secondary | ICD-10-CM

## 2019-02-01 DIAGNOSIS — E785 Hyperlipidemia, unspecified: Secondary | ICD-10-CM

## 2019-02-01 DIAGNOSIS — R1310 Dysphagia, unspecified: Secondary | ICD-10-CM | POA: Diagnosis not present

## 2019-02-01 DIAGNOSIS — Z0000 Encounter for general adult medical examination without abnormal findings: Secondary | ICD-10-CM | POA: Diagnosis not present

## 2019-02-01 DIAGNOSIS — E038 Other specified hypothyroidism: Secondary | ICD-10-CM

## 2019-02-01 DIAGNOSIS — Z780 Asymptomatic menopausal state: Secondary | ICD-10-CM | POA: Diagnosis not present

## 2019-02-01 DIAGNOSIS — K219 Gastro-esophageal reflux disease without esophagitis: Secondary | ICD-10-CM | POA: Diagnosis not present

## 2019-02-01 DIAGNOSIS — R945 Abnormal results of liver function studies: Secondary | ICD-10-CM

## 2019-02-01 DIAGNOSIS — M545 Low back pain, unspecified: Secondary | ICD-10-CM

## 2019-02-01 DIAGNOSIS — K580 Irritable bowel syndrome with diarrhea: Secondary | ICD-10-CM | POA: Diagnosis not present

## 2019-02-01 DIAGNOSIS — E041 Nontoxic single thyroid nodule: Secondary | ICD-10-CM | POA: Diagnosis not present

## 2019-02-01 DIAGNOSIS — G8929 Other chronic pain: Secondary | ICD-10-CM

## 2019-02-01 DIAGNOSIS — R69 Illness, unspecified: Secondary | ICD-10-CM | POA: Diagnosis not present

## 2019-02-01 DIAGNOSIS — H6991 Unspecified Eustachian tube disorder, right ear: Secondary | ICD-10-CM

## 2019-02-01 DIAGNOSIS — F332 Major depressive disorder, recurrent severe without psychotic features: Secondary | ICD-10-CM

## 2019-02-01 DIAGNOSIS — R110 Nausea: Secondary | ICD-10-CM

## 2019-02-01 DIAGNOSIS — H6981 Other specified disorders of Eustachian tube, right ear: Secondary | ICD-10-CM

## 2019-02-01 DIAGNOSIS — R7989 Other specified abnormal findings of blood chemistry: Secondary | ICD-10-CM

## 2019-02-01 DIAGNOSIS — F4310 Post-traumatic stress disorder, unspecified: Secondary | ICD-10-CM

## 2019-02-01 DIAGNOSIS — I10 Essential (primary) hypertension: Secondary | ICD-10-CM

## 2019-02-01 MED ORDER — ONDANSETRON HCL 4 MG PO TABS: 4.0000 mg | ORAL_TABLET | Freq: Three times a day (TID) | ORAL | 3 refills | Status: DC | PRN

## 2019-02-01 MED ORDER — EZETIMIBE 10 MG PO TABS: 10.0000 mg | ORAL_TABLET | Freq: Every day | ORAL | 3 refills | Status: DC

## 2019-02-01 MED ORDER — METOPROLOL TARTRATE 50 MG PO TABS: 50.0000 mg | ORAL_TABLET | Freq: Two times a day (BID) | ORAL | 3 refills | Status: DC

## 2019-02-01 MED ORDER — FLUTICASONE PROPIONATE 50 MCG/ACT NA SUSP: 2.0000 | Freq: Every day | NASAL | 6 refills | Status: AC

## 2019-02-01 NOTE — Patient Instructions (Signed)

## 2019-02-02 ENCOUNTER — Other Ambulatory Visit: Payer: Self-pay | Admitting: Family Medicine

## 2019-02-02 DIAGNOSIS — E041 Nontoxic single thyroid nodule: Secondary | ICD-10-CM

## 2019-02-02 LAB — COMPREHENSIVE METABOLIC PANEL
ALT: 57 U/L — ABNORMAL HIGH (ref 0–35)
AST: 43 U/L — ABNORMAL HIGH (ref 0–37)
Albumin: 4.3 g/dL (ref 3.5–5.2)
Alkaline Phosphatase: 81 U/L (ref 39–117)
BUN: 7 mg/dL (ref 6–23)
CO2: 26 mEq/L (ref 19–32)
Calcium: 10 mg/dL (ref 8.4–10.5)
Chloride: 102 mEq/L (ref 96–112)
Creatinine, Ser: 0.83 mg/dL (ref 0.40–1.20)
GFR: 74.38 mL/min (ref >60.00)
Glucose, Bld: 97 mg/dL (ref 70–99)
Potassium: 4.3 mEq/L (ref 3.5–5.1)
Sodium: 140 mEq/L (ref 135–145)
Total Bilirubin: 0.6 mg/dL (ref 0.2–1.2)
Total Protein: 6.6 g/dL (ref 6.0–8.3)

## 2019-02-03 ENCOUNTER — Ambulatory Visit (INDEPENDENT_AMBULATORY_CARE_PROVIDER_SITE_OTHER): Payer: Medicare HMO | Admitting: Psychiatry

## 2019-02-03 ENCOUNTER — Other Ambulatory Visit: Payer: Self-pay

## 2019-02-03 ENCOUNTER — Encounter (HOSPITAL_COMMUNITY): Payer: Self-pay | Admitting: Psychiatry

## 2019-02-03 DIAGNOSIS — F332 Major depressive disorder, recurrent severe without psychotic features: Secondary | ICD-10-CM | POA: Diagnosis not present

## 2019-02-03 DIAGNOSIS — R69 Illness, unspecified: Secondary | ICD-10-CM | POA: Diagnosis not present

## 2019-02-03 DIAGNOSIS — F4310 Post-traumatic stress disorder, unspecified: Secondary | ICD-10-CM
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
## 2019-02-04 ENCOUNTER — Encounter: Payer: Self-pay | Admitting: Obstetrics and Gynecology

## 2019-02-04 ENCOUNTER — Telehealth: Payer: Self-pay | Admitting: Physical Therapy

## 2019-02-04 NOTE — Telephone Encounter (Signed)
Copied from [place] [id number]. Topic: General - Other >> Feb 04, 2019  2:26 PM Rayann Heman wrote: Reason for CRM: pt calling and stated that she missed a call regarding labs. Please advise

## 2019-02-05 NOTE — Telephone Encounter (Signed)
Called patient reviewed all information.  

## 2019-02-09 NOTE — Telephone Encounter (Signed)
Patient requesting a call back to go over results again. Patient states that she cannot remember what was said.

## 2019-02-10 ENCOUNTER — Telehealth: Payer: Self-pay | Admitting: Internal Medicine

## 2019-02-10 ENCOUNTER — Other Ambulatory Visit: Payer: Self-pay

## 2019-02-10 ENCOUNTER — Ambulatory Visit (INDEPENDENT_AMBULATORY_CARE_PROVIDER_SITE_OTHER): Payer: Medicare HMO | Admitting: Psychiatry

## 2019-02-10 DIAGNOSIS — R69 Illness, unspecified: Secondary | ICD-10-CM | POA: Diagnosis not present

## 2019-02-10 DIAGNOSIS — F4310 Post-traumatic stress disorder, unspecified: Secondary | ICD-10-CM | POA: Diagnosis not present

## 2019-02-10 DIAGNOSIS — F332 Major depressive disorder, recurrent severe without psychotic features: Secondary | ICD-10-CM

## 2019-02-10 MED ORDER — COLESTIPOL HCL 1 G PO TABS: ORAL_TABLET | ORAL | 1 refills | Status: DC

## 2019-02-10 NOTE — Telephone Encounter (Signed)
Left message to return call to our office.  

## 2019-02-10 NOTE — Telephone Encounter (Signed)
[place] refilled 
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
## 2019-02-11 ENCOUNTER — Ambulatory Visit: Payer: Medicare HMO | Admitting: Internal Medicine

## 2019-02-11 ENCOUNTER — Other Ambulatory Visit: Payer: Self-pay

## 2019-02-11 DIAGNOSIS — R7989 Other specified abnormal findings of blood chemistry: Secondary | ICD-10-CM

## 2019-02-11 NOTE — Telephone Encounter (Signed)
See lab note for documentation.  

## 2019-02-12 ENCOUNTER — Encounter (HOSPITAL_COMMUNITY): Payer: Self-pay | Admitting: Psychiatry

## 2019-02-17 ENCOUNTER — Encounter (HOSPITAL_COMMUNITY): Payer: Self-pay | Admitting: Psychiatry

## 2019-02-17 ENCOUNTER — Ambulatory Visit (INDEPENDENT_AMBULATORY_CARE_PROVIDER_SITE_OTHER): Payer: Medicare HMO | Admitting: Psychiatry

## 2019-02-17 ENCOUNTER — Other Ambulatory Visit: Payer: Self-pay

## 2019-02-17 DIAGNOSIS — R69 Illness, unspecified: Secondary | ICD-10-CM | POA: Diagnosis not present

## 2019-02-17 DIAGNOSIS — F4310 Post-traumatic stress disorder, unspecified: Secondary | ICD-10-CM

## 2019-02-17 DIAGNOSIS — F332 Major depressive disorder, recurrent severe without psychotic features: Secondary | ICD-10-CM | POA: Diagnosis not present

## 2019-02-17 NOTE — Progress Notes (Signed)
Virtual Visit via Video Note  I connected with Shahed N [name] on 02/17/19 at  4:00 PM EDT by a video enabled telemedicine application and verified that I am speaking with the correct person using two identifiers.  Location: Patient: Andrea [name] Provider:  , LCSW   I discussed the limitations of evaluation and management by telemedicine and the availability of in person appointments. The patient expressed understanding and agreed to proceed.  History of Present Illness: MDD and PTSD   Observations/Objective: Counselor met with Marria for individual therapy via Webex. Counselor assessed MH symptoms and progress on treatment plan goals. Jeaninne denied suicidal ideationor self-harm behaviors, however she continues to endores morbid thoughts of a desire to get a terminal illness and pass naturally. Simonne shared that she will have test completed next week regarding her recent diagnoses of Hashimoto and questionable blood work regarding her kidneys. Ottilia continues to endorse that she would deny treatment for any terminal illness. Counselor explored these thoughts and feelings, assessing safety and compliance with safety plan. Undrea shared that she is reengaged in [facility], Hosp[facility], and attending church online and in person. Riona shared that she attended a social gathering on Sunday, which was very triggering, but was able to enjoy moments and stretch herself to do something different than sitting at home watching television or grieving. Counselor praised Kwanza for her bravery, ability to cope and for trying something new. Iraida shared how she prepared before the event, coped during and expressed intense emotions afterwards. Counselor assessed status of relationship dynamics within her family. Ranette shared updates and progress. Counselor and Ambrosia discussed the duality of her current goals in life, one being to be with her son who passed, the other is a desire to  enjoy life and give back through her strengths, knowledge and abilities. Athea presented as more stable today. She assured Counselor that she will continue to take medications as prescribed, will follow safety plan and will reach out if suicidal ideations occur.   Assessment and Plan: Counselor will continue to meet with patient to address treatment plan goals. Patient will continue to follow recommendations of providers and implement skills learned in session.  Follow Up Instructions: Counselor will send information for next session via Webex.     I discussed the assessment and treatment plan with the patient. The patient was provided an opportunity to ask questions and all were answered. The patient agreed with the plan and demonstrated an understanding of the instructions.   The patient was advised to call back or seek an in-person evaluation if the symptoms worsen or if the condition fails to improve as anticipated.  I provided 60 minutes of non-face-to-face time during this encounter.    , LCSW  

## 2019-02-22 ENCOUNTER — Ambulatory Visit
Admission: RE | Admit: 2019-02-22 | Discharge: 2019-02-22 | Disposition: A | Discharge status: Home / Self Care | Payer: Medicare HMO | Source: Ambulatory Visit | Attending: Family Medicine | Admitting: Family Medicine

## 2019-02-22 ENCOUNTER — Other Ambulatory Visit: Payer: Medicare HMO

## 2019-02-22 DIAGNOSIS — E041 Nontoxic single thyroid nodule: Secondary | ICD-10-CM | POA: Diagnosis not present

## 2019-02-22 DIAGNOSIS — R7989 Other specified abnormal findings of blood chemistry: Secondary | ICD-10-CM

## 2019-02-22 DIAGNOSIS — R945 Abnormal results of liver function studies: Secondary | ICD-10-CM | POA: Diagnosis not present

## 2019-02-24 ENCOUNTER — Ambulatory Visit (INDEPENDENT_AMBULATORY_CARE_PROVIDER_SITE_OTHER): Payer: Medicare HMO | Admitting: Psychiatry

## 2019-02-24 ENCOUNTER — Other Ambulatory Visit: Payer: Self-pay

## 2019-02-24 DIAGNOSIS — F332 Major depressive disorder, recurrent severe without psychotic features: Secondary | ICD-10-CM | POA: Diagnosis not present

## 2019-02-24 DIAGNOSIS — F4310 Post-traumatic stress disorder, unspecified: Secondary | ICD-10-CM | POA: Diagnosis not present

## 2019-02-24 DIAGNOSIS — R69 Illness, unspecified: Secondary | ICD-10-CM | POA: Diagnosis not present

## 2019-02-24 NOTE — Progress Notes (Signed)
Virtual Visit via Video Note  I connected with Andrea [name] on 02/24/19 at  4:00 PM EDT by a video enabled telemedicine application and verified that I am speaking with the correct person using two identifiers.  Location: Patient: Andrea [name] (formerly Andrea [name]) Provider: Lise Auer, LCSW   I discussed the limitations of evaluation and management by telemedicine and the availability of in person appointments. The patient expressed understanding and agreed to proceed.  History of Present Illness: MDD and PTSD   Observations/Objective: Counselor met with Andrea [name] for individual therapy via Webex. Counselor assessed MH symptoms and progress on treatment plan goals. Andrea [name]'s depression is moderate to severe and anxiety moderate. Andrea [name] endorsed suicidal ideations without a plan, stating that she thinks about dying daily and has a desire that her life would end of natural causes. Andrea [name] denied self-harm behaviors. Andrea [name] discussed her SI and trauma triggers in relation to the loss of her son. Andrea [name]'s primary trigger at this time is her upcoming birthday (also mentioned Halloween, Thanksgiving, Christmas and the anniversary of his passing in late Jan). Andrea [name] states that she wants to go into a whole in the earth until Feb 2021, as she fears she is not strong enough to make it through the next 4 months. Counselor discussed coping strategies and preventative ways to deal with grief and loss, trauma reminders and depression. Andrea [name] endorsed that she continues to be engaged in support groups and wellness programs through [employer], [facility], H[facility], [facility] and [facility]. Andrea [name] is involved in her church 2-3 days a week as well. She states that these outlets have kept her alive and given her purpose and support. Andrea [name] endorsed that her agoraphobia has increased, however, all providers are encouraging that she get out of the house and connect with others in person, as a way of  distraction and limiting time to ruminate on SI or triggering thoughts. With Andrea [name]'s permission and encouragement, her father was invited into the session to discuss her safety plan, crisis numbers and to gain insight about her functioning. Her father, Andrea [name] shared insightful observations and committed to monitoring her symptoms and reaching out for support as needed. Andrea [name] committed to contacting crisis numbers and to her primary supports if SI increases or she has an actual plan. Andrea [name] is onboard with initiating [facility] crisis or inpatient behavioral health services if needed.   Assessment and Plan: Counselor will continue to meet with patient to address treatment plan goals. Patient will continue to follow recommendations of providers and implement skills learned in session.  Follow Up Instructions: Counselor will send information for next session via Webex.    I discussed the assessment and treatment plan with the patient. The patient was provided an opportunity to ask questions and all were answered. The patient agreed with the plan and demonstrated an understanding of the instructions.   The patient was advised to call back or seek an in-person evaluation if the symptoms worsen or if the condition fails to improve as anticipated.  I provided 65 minutes of non-face-to-face time during this encounter.   Lise Auer, LCSW

## 2019-02-27 ENCOUNTER — Encounter (HOSPITAL_COMMUNITY): Payer: Self-pay | Admitting: Psychiatry

## 2019-03-02 ENCOUNTER — Telehealth: Payer: Self-pay | Admitting: Family Medicine

## 2019-03-02 NOTE — Telephone Encounter (Signed)
See note  Copied from [place] [id number]. Topic: General - Other >> Mar 02, 2019  9:31 AM Rayann Heman wrote: Reason for CRM: pt called and stated that she would like a call regarding recent imaging results. Pt would like a call back regarding. Please advise

## 2019-03-03 ENCOUNTER — Other Ambulatory Visit: Payer: Self-pay

## 2019-03-03 ENCOUNTER — Encounter (HOSPITAL_COMMUNITY): Payer: Self-pay | Admitting: Psychiatry

## 2019-03-03 ENCOUNTER — Ambulatory Visit (INDEPENDENT_AMBULATORY_CARE_PROVIDER_SITE_OTHER): Payer: Medicare HMO | Admitting: Psychiatry

## 2019-03-03 DIAGNOSIS — F332 Major depressive disorder, recurrent severe without psychotic features: Secondary | ICD-10-CM | POA: Diagnosis not present

## 2019-03-03 DIAGNOSIS — R69 Illness, unspecified: Secondary | ICD-10-CM | POA: Diagnosis not present

## 2019-03-03 DIAGNOSIS — F4310 Post-traumatic stress disorder, unspecified: Secondary | ICD-10-CM

## 2019-03-03 NOTE — Progress Notes (Signed)
Virtual Visit via Video Note  I connected with Andrea [name] on 03/03/19 at  4:00 PM EDT by a video enabled telemedicine application and verified that I am speaking with the correct person using two identifiers.  Location: Patient: Andrea [name], [place] Provider: Regino Bellow   I discussed the limitations of evaluation and management by telemedicine and the availability of in person appointments. The patient expressed understanding and agreed to proceed.  History of Present Illness: PTSD and Severe MDD   Observations/Objective: Counselor met with Andrea [name] and her father, Andrea [name] for family therapy via Webex. Andrea [name] originally did not log onto Andrea [name], so Counselor called her cell phone and it went straight to vm. Counselor was concerned about Andrea [name] well-being due increasing depressive symptoms over the past 2 weeks, so her father Andrea [name] was contacted. Counselor connected with Andrea [name], who shared that Andrea [name] had an episode of rage last night until about midnight and has been in the bed all day refusing to eat or drink. Andrea [name] stated that she said she wants to give up on therapy and support group because no one can bring her son back. She did not plan to commit suicide but wanted to die of natural causes. Counselor reviewed safety plan with Andrea [name], checked in on his thoughts and how this is impacting him. Andrea [name] stated that he too is skeptical that anything will help her at this point, however, he was willing to do all in his power to keep her safe. Andrea [name] plans to take off work tomorrow and Friday to ensure her safety and monitor symptoms. Counselor requested to speak with Andrea [name]. Andrea [name] asked and she agreed. Counselor assessed MH symptoms and current functioning. It took a few moments for Andrea [name] to become awake and oriented. She was able to answer questions in a coherent manner. Andrea [name] endorsed her dad's account of her overall outlook. She believed that she had been asleep/disaccociated in her room for 3 days  (according to dad it had been 18 hours. She reported that she had not ate or drank anything, as well as, refused her medication. Counselor assessed rot causes of change in outlook and behaviors, identifying 3 major trauma triggers in the past few days as well as being in the anger stage of grief. Counselor discussed motivators to live, supportive services, suicidal ideations, a safety plan and coping strategies she can utilizing presently, in the next hour and through tomorrow. Andrea [name] denied a clear plan or means for suicide, however she endorsed that she wants to "be with her son" who passed in Jan 2020 by suicide because she wants to understand why he did it. Andrea [name] denied self-harm behaviors and endorsed actively attempting to disassociate. Andrea [name] denied substance use or overdosing with medications. Andrea [name] started stating that she was very confused as she stated to realized that is was only Wednesday and becoming more connected with reality. She stated several times that she "did not want to go through another hospitalization. Counselor discussed mandated protocols that will have to be taken if she does verbalize a plan or is attempting to commit suicide. She understood and stated that she was willing to take her medications as prescribed and that the Counselor could contact her Peer Support through [facility] to make them aware of changes in functioning. Counselor highlighted the progress Andrea [name] has made over the past 9 months, the skills she has mastered, her positive traits and a variety of people and groups who care about her and her well-being. Counselor ended with Andrea [name] and closed by  following up with Andrea [name] to reiterate the safety plan and ideas for promoting wellness for her over the next several days.   Assessment and Plan: Andrea [name]/Andrea [name] will contact crisis numbers and agencies if symptoms worsen. Counselor will have her assessed at [facility] inpatient if made aware of suicidal behaviors/plan. Counselor  will contact supervisor to staff situation and gain guidance for additional action. Counselor will contact Andrea [name] and Andrea [name] tomorrow and Friday to assess functioning and adherence to safety plan. Counselor will continue to meet with patient to address treatment plan goals. Patient will continue to follow recommendations of providers and implement skills learned in session.   Follow Up Instructions: Counselor will send information for next session via Webex.     I discussed the assessment and treatment plan with the patient. The patient was provided an opportunity to ask questions and all were answered. The patient agreed with the plan and demonstrated an understanding of the instructions.   The patient was advised to call back or seek an in-person evaluation if the symptoms worsen or if the condition fails to improve as anticipated.  I provided 60 minutes of non-face-to-face time during this encounter.   Andrea Auer, LCSW

## 2019-03-05 NOTE — Telephone Encounter (Signed)
Called patient reviewed all information and answered questions.

## 2019-03-07 ENCOUNTER — Other Ambulatory Visit: Payer: Self-pay | Admitting: Internal Medicine

## 2019-03-10 ENCOUNTER — Other Ambulatory Visit: Payer: Self-pay

## 2019-03-10 ENCOUNTER — Ambulatory Visit (INDEPENDENT_AMBULATORY_CARE_PROVIDER_SITE_OTHER): Payer: Medicare HMO | Admitting: Psychiatry

## 2019-03-10 DIAGNOSIS — F332 Major depressive disorder, recurrent severe without psychotic features: Secondary | ICD-10-CM

## 2019-03-10 DIAGNOSIS — R69 Illness, unspecified: Secondary | ICD-10-CM | POA: Diagnosis not present

## 2019-03-10 DIAGNOSIS — F4310 Post-traumatic stress disorder, unspecified: Secondary | ICD-10-CM | POA: Diagnosis not present

## 2019-03-11 ENCOUNTER — Encounter (HOSPITAL_COMMUNITY): Payer: Self-pay | Admitting: Psychiatry

## 2019-03-11 ENCOUNTER — Ambulatory Visit: Payer: Medicare HMO | Admitting: Obstetrics and Gynecology

## 2019-03-11 NOTE — Progress Notes (Signed)
Virtual Visit via Video Note  I connected with [name] B. Ouk on 03/11/19 at  4:00 PM EDT by a video enabled telemedicine application and verified that I am speaking with the correct person using two identifiers.  Location: Patient: Andrea [name] Provider: Lise Auer, LCSW   I discussed the limitations of evaluation and management by telemedicine and the availability of in person appointments. The patient expressed understanding and agreed to proceed.  History of Present Illness: PTSD and MDD   Observations/Objective: Counselor met with [name] for individual therapy via Webex. Counselor assessed MH symptoms and progress on treatment plan goals. Margrett presented as more emotional stable today, compared to her dissociative state during our session last week. Avarey presents with moderate to high anxiety and moderate to severe depression. Jerzy denied suicidal ideation or self-harm behaviors. Hosanna shared that she is experiencing a lot of anger this week related to her dad and others involvement in her son's passing, as well as childhood issues she experienced with her dad. Jamari also discussed being triggered by mothers who complain about their children, as she stated, she would do anything to have her son back. Counselor took time to better understand the dynamics between [place] and her parents during her childhood and into present day interactions. Kalene shared that she has reengaged with peer support, support groups, church and volunteering. Counselor and Gianella discussed a plan for her birthday coming up on Saturday. She anticipates being triggered on this day, as it will be her first birthday without her son. Counselor promoted connection with others, attending support group, self-care activities, such as walking, watching a favorite movie, pampering herself, and sleeping or resting if the emotions become too overwhelming. We reviewed her safety plan again and she  verbalized that she would reach out if needed.   Assessment and Plan: Counselor will continue to meet with patient to address treatment plan goals. Patient will continue to follow recommendations of providers and implement skills learned in session.  Follow Up Instructions: Counselor will send information for next session via Webex.     I discussed the assessment and treatment plan with the patient. The patient was provided an opportunity to ask questions and all were answered. The patient agreed with the plan and demonstrated an understanding of the instructions.   The patient was advised to call back or seek an in-person evaluation if the symptoms worsen or if the condition fails to improve as anticipated.  I provided 60 minutes of non-face-to-face time during this encounter.   Lise Auer, LCSW

## 2019-03-17 ENCOUNTER — Ambulatory Visit (INDEPENDENT_AMBULATORY_CARE_PROVIDER_SITE_OTHER): Payer: Medicare HMO | Admitting: Psychiatry

## 2019-03-17 ENCOUNTER — Other Ambulatory Visit: Payer: Self-pay

## 2019-03-17 DIAGNOSIS — F4310 Post-traumatic stress disorder, unspecified: Secondary | ICD-10-CM | POA: Diagnosis not present

## 2019-03-17 DIAGNOSIS — F332 Major depressive disorder, recurrent severe without psychotic features: Secondary | ICD-10-CM

## 2019-03-17 DIAGNOSIS — R69 Illness, unspecified: Secondary | ICD-10-CM | POA: Diagnosis not present

## 2019-03-17 NOTE — Progress Notes (Signed)
Virtual Visit via Video Note  I connected with [name] B. Wilczak on 03/17/19 at  4:00 PM EDT by a video enabled telemedicine application and verified that I am speaking with the correct person using two identifiers.  Location: Patient: Andrea [name] Provider: Lise Auer, LCSW   I discussed the limitations of evaluation and management by telemedicine and the availability of in person appointments. The patient expressed understanding and agreed to proceed.  History of Present Illness: PTSD and MDD   Observations/Objective: Counselor met with [name] for individual therapy via Webex. Counselor assessed MH symptoms and progress on treatment plan goals. Sha presented as severely depressed with moderate Memory denied suicidal ideation or self-harm behaviors, however she continues to be content with the idea of her life ending early by natural causes. Sherice shared that she made it through her birthday weekend by participating in peer support, support groups, attending church and staying at home and sleeping the remainder of the time. Counselor praised Engineer, civil (consulting) for her ability to overcome the challenges of this weekend of significance, that she was able to successfully utilize her coping skills, her support system and intentionally followed her plan without the need of residential treatment. Junell shared that she will begin a new type of treatment tomorrow, sharing her curiosity, concerns and fears with the treatment. Counselor shared psychoeducation about the treatment modality as well as what to anticipate with the therapeutic process. Counselor and Kanoelani explored current triggers, intense emotions, grief and loss cycle and current coping strategies. Counselor processed roots of anger as Zara identified parts of her son's death that evoke anger in her. Lasharn was able to process more of her trauma experience. Counselor to continue daily check ins, as she anticipates this coming  weekend being difficult as well, Halloween was her son's favorite holiday. Marisue will continue to stay connected with local support services and will utilize crisis numbers if needed.  Assessment and Plan: Counselor will continue to meet with patient to address treatment plan goals. Patient will continue to follow recommendations of providers and implement skills learned in session.  Follow Up Instructions: Counselor will send information for next session via Webex.     I discussed the assessment and treatment plan with the patient. The patient was provided an opportunity to ask questions and all were answered. The patient agreed with the plan and demonstrated an understanding of the instructions.   The patient was advised to call back or seek an in-person evaluation if the symptoms worsen or if the condition fails to improve as anticipated.  I provided 65 minutes of non-face-to-face time during this encounter.   Lise Auer, LCSW

## 2019-03-18 ENCOUNTER — Ambulatory Visit: Payer: Medicare HMO | Admitting: Internal Medicine

## 2019-03-21 ENCOUNTER — Encounter (HOSPITAL_COMMUNITY): Payer: Self-pay | Admitting: Psychiatry

## 2019-03-23 ENCOUNTER — Ambulatory Visit (HOSPITAL_COMMUNITY): Payer: Medicare HMO | Admitting: Psychiatry

## 2019-03-23 ENCOUNTER — Other Ambulatory Visit: Payer: Self-pay

## 2019-03-23 DIAGNOSIS — Z0100 Encounter for examination of eyes and vision without abnormal findings: Secondary | ICD-10-CM | POA: Diagnosis not present

## 2019-03-23 DIAGNOSIS — H5213 Myopia, bilateral: Secondary | ICD-10-CM | POA: Diagnosis not present

## 2019-03-25 DIAGNOSIS — M5416 Radiculopathy, lumbar region: Secondary | ICD-10-CM | POA: Diagnosis not present

## 2019-03-25 DIAGNOSIS — G894 Chronic pain syndrome: Secondary | ICD-10-CM | POA: Diagnosis not present

## 2019-03-25 DIAGNOSIS — M4726 Other spondylosis with radiculopathy, lumbar region: Secondary | ICD-10-CM | POA: Diagnosis not present

## 2019-03-25 DIAGNOSIS — Z79891 Long term (current) use of opiate analgesic: Secondary | ICD-10-CM | POA: Diagnosis not present

## 2019-03-31 ENCOUNTER — Other Ambulatory Visit: Payer: Self-pay

## 2019-03-31 ENCOUNTER — Ambulatory Visit (INDEPENDENT_AMBULATORY_CARE_PROVIDER_SITE_OTHER): Payer: Medicare HMO | Admitting: Psychiatry

## 2019-03-31 ENCOUNTER — Encounter (HOSPITAL_COMMUNITY): Payer: Self-pay | Admitting: Psychiatry

## 2019-03-31 DIAGNOSIS — R69 Illness, unspecified: Secondary | ICD-10-CM | POA: Diagnosis not present

## 2019-03-31 DIAGNOSIS — F4310 Post-traumatic stress disorder, unspecified: Secondary | ICD-10-CM

## 2019-03-31 DIAGNOSIS — F332 Major depressive disorder, recurrent severe without psychotic features: Secondary | ICD-10-CM | POA: Diagnosis not present

## 2019-03-31 NOTE — Progress Notes (Signed)
Virtual Visit via Video Note  I connected with Andrea [name] on 03/31/19 at  3:00 PM EST by a video enabled telemedicine application and verified that I am speaking with the correct person using two identifiers.  Location: Patient: Andrea [name] Provider: Lise Auer, LCSW   I discussed the limitations of evaluation and management by telemedicine and the availability of in person appointments. The patient expressed understanding and agreed to proceed.  History of Present Illness: PTSD and MDD   Observations/Objective: Counselor met with Andrea [name] for individual therapy via Webex. Counselor assessed MH symptoms and progress on treatment plan goals. Andrea [name] presents with moderate depression and high anxiety. Andrea [name] denied suicidal ideation or self-harm behaviors. Andrea [name] shared that she had a difficult conversation with her son today that has impacted her mental health and outlook on life. Counselor explored the conversation as Andrea [name] identified the various emotions felt during and after they talked. Counselor and Andrea [name] processed grief and loss connected with her other son's passing in connection with the distressing relationship with the above mentioned son. Andrea [name] continues to participate in support groups and volunteering. She reported talking with her psychiatrist about ECT to address depressive symptoms. Andrea [name] reported that "Rewind Therapy" was not helpful for her, and that she believes it actually caused more emotional pain than healing. We discussed the process of trauma based therapies and her resistance to allow herself to relieve the traumatic event of her son's suicide. Andrea [name] shared about attempts to socially connect with others and her concerns and hesitations with that process. Counselor praised Andrea [name] for her ability to stay engaged in community resources and in how she is expressing her emotions in session.   Assessment and Plan: Counselor will continue to meet  with patient to address treatment plan goals. Patient will continue to follow recommendations of providers and implement skills learned in session.  Follow Up Instructions: Counselor will send information for next session via Webex.     I discussed the assessment and treatment plan with the patient. The patient was provided an opportunity to ask questions and all were answered. The patient agreed with the plan and demonstrated an understanding of the instructions.   The patient was advised to call back or seek an in-person evaluation if the symptoms worsen or if the condition fails to improve as anticipated.  I provided 55 minutes of non-face-to-face time during this encounter.   Lise Auer, LCSW

## 2019-04-01 ENCOUNTER — Telehealth: Payer: Self-pay | Admitting: Family Medicine

## 2019-04-01 DIAGNOSIS — E559 Vitamin D deficiency, unspecified: Secondary | ICD-10-CM

## 2019-04-01 NOTE — Telephone Encounter (Signed)
Requested medication (s) are due for refill today: yes  Requested medication (s) are on the active medication list: yes  Future visit scheduled: no  Notes to clinic: last filled by historical provider Refill cannot be delegated    Requested Prescriptions  Pending Prescriptions Disp Refills   ergocalciferol (VITAMIN D2) 1.25 MG (50000 UT) capsule       Sig: Take 1 capsule (50,000 Units total) by mouth once a week.     Endocrinology:  Vitamins - Vitamin D Supplementation Failed - 04/01/2019  9:09 AM      Failed - 50,000 IU strengths are not delegated      Failed - Phosphate in normal range and within 360 days    No results found for: PHOS       Failed - Vitamin D in normal range and within 360 days    Vit D, 25-Hydroxy  Date Value Ref Range Status  09/30/2016 36 30 - 100 ng/mL Final    Comment:    Vitamin D Status           25-OH Vitamin D        Deficiency                <20 ng/mL        Insufficiency         20 - 29 ng/mL        Optimal             > or = 30 ng/mL   For 25-OH Vitamin D testing on patients on D2-supplementation and patients for whom quantitation of D2 and D3 fractions is required, the QuestAssureD 25-OH VIT D, (D2,D3), LC/MS/MS is recommended: order code [id number] (patients > 2 yrs).          Passed - Ca in normal range and within 360 days    Calcium  Date Value Ref Range Status  02/01/2019 10.0 8.4 - 10.5 mg/dL Final         Passed - Valid encounter within last 12 months    Recent Outpatient Visits          1 month ago Routine physical examination   [place], DO   3 months ago Acquired hypothyroidism   [place], DO   7 years ago Foot pain   [facility], Janalee Dane, MD

## 2019-04-01 NOTE — Telephone Encounter (Signed)
ergocalciferol (VITAMIN D2) 1.25 MG (50000 UT) capsule  [id number] - [place], [place] - [address] AT [place] [id number] (Phone) [phone] (Fax)

## 2019-04-02 NOTE — Telephone Encounter (Signed)
So you want her to stay on high dose?

## 2019-04-02 NOTE — Telephone Encounter (Signed)
Pt called in to follow up on Rx request. Pt says that her Rx is for vit D level   50,000 units.     [id number] - [place], [place] - [place] [phone] (Phone) [phone] (Fax)

## 2019-04-02 NOTE — Telephone Encounter (Signed)
See request °

## 2019-04-02 NOTE — Telephone Encounter (Signed)
Treatment typically is for 4-12 weeks, then rechecked. I don't see a vit D level since 2017??  No refill.  rec OTC 1000-2000 units daily.

## 2019-04-05 ENCOUNTER — Telehealth: Payer: Self-pay

## 2019-04-05 NOTE — Telephone Encounter (Addendum)
Called and spoke with patient and advised of notes per Dr. Jonni Sanger.  Patient was extremely upset and angry that we would not refill her Vitamin D.  Advised that our office protocol was that a patient would not be on prescription strength Vitamin D (given that their Vit D level had been tested recently and they were found to be deficient in Vitamin D) for more than 12 weeks.  I further advised that she should get an OTC supplement of Vitamin D of 2000 iu and take daily.  Patient had a very defensive tone and repeatedly keep stating that she had been on this "rx strength" Vitamin D for 15 years.  Upon review of patient's chart, she has only been seen in our office twice (previous pt of Dr. Juleen China) and she has never had her Vitamin D level checked here in our office.  Per Dr. Jonni Sanger, Vitamin D level has not been checked since 2017.  Patient stated that her son committed suicide recently (I believe she said January of this year) and that she, herself was suicidal.  I told her that I was extremely concerned for her safety and well being and advised that she either call 911 or go to nearest ED.  Patient was extremely verbally abusive and using extreme profanity and being very disrespectful.  I explained to patient that I was going above and beyond trying to do everything possible that I could to try and help her.  I also reminded her that I was speaking to calmly and respectfully; if she could not extend me the same courtesy, perhaps this was not the best time for she and I to be having this conversation, because we were not getting any closer to a solution.    I told patient that I would see if there was a supervisor or someone else that could speak with her and help.  I placed the call on hold and found that there was no one else available to take the call.  I resumed call and explained to patient that it appeared that I was the only one left in the office and I would forward this information to my supervisor and make  sure that she reached out to patient on Monday 11/16.  Patient again repeatedly kept going back to being angry and defensive questioning why we couldn't refill a "simple fucking Vitamin D prescription"  Again, patient using extremely profane language and becoming extremely personal with her comments, asking me "Have you ever lost a child?" "Have you?" I replied that I had not and expressed my heartfelt compassion stating "I cannot possibly begin to imagine what you've been through and what I'm certain that you are still going through"  At that point, she stated "I hope that you do!" "I hope that you do lose a child and go through what I went through one day!" She repeated this same statement multiple times throughout the rest of conversation.  This went on for more than 10 minutes and after hearing a voice in the hallway, I put my hand over the phone asking "is there someone out there that can help me?"  When I got back on the phone the patient stated "What are you doing asking someone for help? I thought you said you were alone? I'm not going to come there and hurt you!" My co worker, Lonell Grandchild was there and I asked her if she could speak to this patient because I was extremely distraught and upset.

## 2019-04-05 NOTE — Telephone Encounter (Signed)
Noted events from below.  Due to abusive profanity, I decline transfer of care.  I recommend dismissal.   I will not refill Vitamin D.

## 2019-04-05 NOTE — Telephone Encounter (Signed)
Copied from [place] [id number]. Topic: General - Call Back - No Documentation >> Apr 05, 2019 11:24 AM Erick Blinks wrote: Reason for CRM: Pt called and is requesting call back from [facility] contact:  [phone]

## 2019-04-05 NOTE — Telephone Encounter (Signed)
Please advise of okay to refill Vit D 50,000 units. I do not see a VIt D level. Pt scheduled for TOC on 07/12/2019.

## 2019-04-05 NOTE — Telephone Encounter (Signed)
No refill.  Hasn't been checked for > 1 year. This is not a long term supplement typically.

## 2019-04-06 ENCOUNTER — Telehealth (HOSPITAL_COMMUNITY): Payer: Self-pay | Admitting: Psychiatry

## 2019-04-06 NOTE — Telephone Encounter (Signed)
Friday afternoon on my way out when I passed By Nhung's area I over heard her conversation with the patient. She was very upset and trying her very best to reason with and help the patient. Delsa Sale was very upset rightfully with the way  That the patient was treating her. I offered to take over the call. When I picked up on the call the patient was repeated in the response in a calm and firm manor. Patient then started to become manipulative in her argument. At that time I repeated that we could not refill medication that we have a protocol and with out provider approval we could not do. Dr. Jonni Sanger had denied the medication per phone note from her early. It was well after 5 pm on Friday and Dr. Jonni Sanger was not in the office. That we would discuss with her on Monday as this is not an urgent medical mater. She continued to escalate her argument with me at that point I kindly repeated the information wished her a nice night and disconnected the call.

## 2019-04-06 NOTE — Telephone Encounter (Addendum)
D:  A former MH-IOP pt phoned Probation officer to voice concern about [place].  According to pt, he has been keeping in contact with patient and the most recent conversation was alarming.  He states that pt mentioned that she would be quitting all her medications and therapy in order to join her deceased son.  A:  Placed call to pt.  Provided pt with support.  Pt admits to saying that, but said she wasn't in a good place at that time.  "I just don't know how I'm going to handle the upcoming holidays.  I was able to get through Halloween and my birthday; but I just don't know about Thanksgiving and Christmas."  Case manager asked pt how she got through those times (Halloween and her b-day)?  "I slept the whole time." Inquired if pt was alone?  Pt still is residing with her father and she states he is home.  Discussed at length the importance of  pt having structure. Upcoming appt with Lise Auer, LCSW on 04-21-19.  Offered to see if therapist could see her sooner; pt declined; stating she would wait until 12-2.  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  Strongly encouraged pt to go to the nearest ED or walk into [facility] if she ever feels unsafe or even contact 911. Pt was able to verbally contract for safety at this time.  Informed Lise Auer, LCSW and Beather Arbour, RNC.  R:  Pt receptive.   Dellia Nims, M.Ed,CNA

## 2019-04-07 ENCOUNTER — Ambulatory Visit (HOSPITAL_COMMUNITY): Payer: Medicare HMO | Admitting: Psychiatry

## 2019-04-07 ENCOUNTER — Ambulatory Visit (INDEPENDENT_AMBULATORY_CARE_PROVIDER_SITE_OTHER): Payer: Medicare HMO | Admitting: Psychiatry

## 2019-04-07 DIAGNOSIS — F332 Major depressive disorder, recurrent severe without psychotic features: Secondary | ICD-10-CM | POA: Diagnosis not present

## 2019-04-07 DIAGNOSIS — F4310 Post-traumatic stress disorder, unspecified: Secondary | ICD-10-CM

## 2019-04-07 DIAGNOSIS — R69 Illness, unspecified: Secondary | ICD-10-CM | POA: Diagnosis not present

## 2019-04-08 ENCOUNTER — Other Ambulatory Visit: Payer: Self-pay

## 2019-04-08 ENCOUNTER — Encounter (HOSPITAL_COMMUNITY): Payer: Self-pay | Admitting: Psychiatry

## 2019-04-08 NOTE — Progress Notes (Signed)
Virtual Visit via Video Note  I connected with Andrea [name] on 04/08/19 at  4:00 PM EST by a video enabled telemedicine application and verified that I am speaking with the correct person using two identifiers.  Location: Patient: Andrea [name] Provider: Lise Auer, LCSW   I discussed the limitations of evaluation and management by telemedicine and the availability of in person appointments. The patient expressed understanding and agreed to proceed.  History of Present Illness: PTSD and MDD   Observations/Objective: Counselor met with Andrea [name] for individual therapy via Webex. Counselor assessed MH symptoms and progress on treatment plan goals. Andrea [name] presents with severe depression and high anxiety. Andrea [name] endorsed passive suicidal ideations, with no plan or intent and denied self-harm behaviors. Counselor reviewed safety plan and assessed to ensure involvement in wrap around services. Counselor assessed daily functioning. Andrea [name] shared that she has been staying home more to tend to her ad abdominal surgery this week. She continues to be involved in volunteering in the community, support groups, peer support and is in the process of seeking a church to attend. Andrea [name] expressed despair over the current state of her relationship with her adult son, father and mother. She is anxious about going into Thanksgiving week without the ability to celebrate as she did in the past with her son who passed in January. Counselor and Andrea [name] discussed grief and loss factors and focused time on developing coping strategies and activities to help her endure the upcoming week without him. Andrea [name] identified multiple distraction methods, communication of needs, grief and loss work, change of environment, things to do in the home and out of the home, and accessing Inpatient Behavioral health services if needed. Andrea [name] reports feeling very supported by her providers and was encouraged by her ability to  identify a variety of coping strategies to combat her PTSD and Depression. Andrea [name] plans to contact providers if her shift in mood or increased suicidal ideations occurs.   Assessment and Plan: Counselor will continue to meet with patient to address treatment plan goals. Patient will continue to follow recommendations of providers and implement skills learned in session.  Follow Up Instructions: Counselor will send information for next session via Webex.     I discussed the assessment and treatment plan with the patient. The patient was provided an opportunity to ask questions and all were answered. The patient agreed with the plan and demonstrated an understanding of the instructions.   The patient was advised to call back or seek an in-person evaluation if the symptoms worsen or if the condition fails to improve as anticipated.  I provided 45 minutes of non-face-to-face time during this encounter.   Lise Auer, LCSW

## 2019-04-12 ENCOUNTER — Other Ambulatory Visit (HOSPITAL_COMMUNITY)
Admission: RE | Admit: 2019-04-12 | Discharge: 2019-04-12 | Disposition: A | Discharge status: Home / Self Care | Payer: Medicare HMO | Source: Ambulatory Visit | Attending: Obstetrics and Gynecology | Admitting: Obstetrics and Gynecology

## 2019-04-12 ENCOUNTER — Other Ambulatory Visit: Payer: Self-pay

## 2019-04-12 ENCOUNTER — Encounter: Payer: Self-pay | Admitting: Obstetrics and Gynecology

## 2019-04-12 ENCOUNTER — Ambulatory Visit (INDEPENDENT_AMBULATORY_CARE_PROVIDER_SITE_OTHER): Payer: Medicare HMO | Admitting: Obstetrics and Gynecology

## 2019-04-12 VITALS — BP 138/82 | HR 68 | Temp 97.1°F | Wt 224.6 lb

## 2019-04-12 DIAGNOSIS — K589 Irritable bowel syndrome without diarrhea: Secondary | ICD-10-CM | POA: Insufficient documentation

## 2019-04-12 DIAGNOSIS — D6859 Other primary thrombophilia: Secondary | ICD-10-CM | POA: Diagnosis not present

## 2019-04-12 DIAGNOSIS — F3289 Other specified depressive episodes: Secondary | ICD-10-CM

## 2019-04-12 DIAGNOSIS — F419 Anxiety disorder, unspecified: Secondary | ICD-10-CM | POA: Insufficient documentation

## 2019-04-12 DIAGNOSIS — N951 Menopausal and female climacteric states: Secondary | ICD-10-CM | POA: Diagnosis not present

## 2019-04-12 DIAGNOSIS — Z90710 Acquired absence of both cervix and uterus: Secondary | ICD-10-CM | POA: Insufficient documentation

## 2019-04-12 DIAGNOSIS — N898 Other specified noninflammatory disorders of vagina: Secondary | ICD-10-CM | POA: Diagnosis not present

## 2019-04-12 DIAGNOSIS — Z1151 Encounter for screening for human papillomavirus (HPV): Secondary | ICD-10-CM | POA: Insufficient documentation

## 2019-04-12 DIAGNOSIS — N952 Postmenopausal atrophic vaginitis: Secondary | ICD-10-CM

## 2019-04-12 DIAGNOSIS — Z1272 Encounter for screening for malignant neoplasm of vagina: Secondary | ICD-10-CM | POA: Diagnosis not present

## 2019-04-12 DIAGNOSIS — F322 Major depressive disorder, single episode, severe without psychotic features: Secondary | ICD-10-CM

## 2019-04-12 DIAGNOSIS — Z01419 Encounter for gynecological examination (general) (routine) without abnormal findings: Secondary | ICD-10-CM | POA: Diagnosis not present

## 2019-04-12 DIAGNOSIS — F32A Depression, unspecified: Secondary | ICD-10-CM | POA: Insufficient documentation

## 2019-04-12 DIAGNOSIS — F329 Major depressive disorder, single episode, unspecified: Secondary | ICD-10-CM | POA: Insufficient documentation

## 2019-04-12 DIAGNOSIS — E8940 Asymptomatic postprocedural ovarian failure: Secondary | ICD-10-CM | POA: Diagnosis not present

## 2019-04-12 DIAGNOSIS — F4321 Adjustment disorder with depressed mood: Secondary | ICD-10-CM

## 2019-04-12 DIAGNOSIS — K649 Unspecified hemorrhoids: Secondary | ICD-10-CM | POA: Insufficient documentation

## 2019-04-12 DIAGNOSIS — R69 Illness, unspecified: Secondary | ICD-10-CM | POA: Diagnosis not present

## 2019-04-12 DIAGNOSIS — K588 Other irritable bowel syndrome: Secondary | ICD-10-CM

## 2019-04-12 DIAGNOSIS — M1990 Unspecified osteoarthritis, unspecified site: Secondary | ICD-10-CM

## 2019-04-12 DIAGNOSIS — D759 Disease of blood and blood-forming organs, unspecified: Secondary | ICD-10-CM

## 2019-04-12 DIAGNOSIS — Z86711 Personal history of pulmonary embolism: Secondary | ICD-10-CM | POA: Diagnosis not present

## 2019-04-12 DIAGNOSIS — E119 Type 2 diabetes mellitus without complications: Secondary | ICD-10-CM | POA: Insufficient documentation

## 2019-04-12 DIAGNOSIS — E7800 Pure hypercholesterolemia, unspecified: Secondary | ICD-10-CM

## 2019-04-12 DIAGNOSIS — I2699 Other pulmonary embolism without acute cor pulmonale: Secondary | ICD-10-CM | POA: Insufficient documentation

## 2019-04-12 DIAGNOSIS — K648 Other hemorrhoids: Secondary | ICD-10-CM

## 2019-04-12 MED ORDER — ESTRACE 0.1 MG/GM VA CREA: TOPICAL_CREAM | VAGINAL | 1 refills | Status: DC

## 2019-04-12 NOTE — Progress Notes (Addendum)
43 y.o. [id number] Divorced White or Caucasian Not Hispanic or Latino female here for annual exam and to discuss postmenopausal symptoms.  H/O hysterectomy when she was 12. She hasn't been on ERT secondary to a protein S deficiency. H/O PE on OCP's in the past.  She vasomotor symptoms are about the same as they have been. Prior GYN gave her biest cream 1 mg/ml, she is taking 1 ml 2 x a day.  She has hot flashes all day long, miserable. As long as she uses the biest cream her symptoms are controlled. She has been in and out of the hospital, so not using.  Night sweats are more tolerable.  She thinks she tried gabapentin in the past and felt loopy.   Her vagina is always irritated, since her hysterectomy. She has estrace cream, she is using it 3 x a week. She skips and do the boric acid capsules.   She uses boric acid 2 x a week, long term. She was having recurrent BV.  Not sexually active for over a year.   Her 4 year old son committed suicide in 1/20.   She is seeing a Social worker, has a Teacher, music. Severe depression, she has attempted suicide this year.     Patient's last menstrual period was 03/19/2011.          Sexually active: No.  The current method of family planning is status post hysterectomy.    Exercising: Yes.    walking, plans to start boxing Smoker:  Yes, vaping, no nicotine   Health Maintenance: Pap:  02/15/2015 WNL History of abnormal Pap:  Yes, cryosurgery in 2005.  MMG:  05/27/2018 right breast bx fibrocystic changes BMD:  Never Colonoscopy: 12/20/2014 WNL TDaP:  01/07/2014 Gardasil: None   reports that she quit smoking about 15 years ago. Her smoking use included cigarettes and e-cigarettes. She has a 15.00 pack-year smoking history. She has never used smokeless tobacco. She reports that she does not drink alcohol or use drugs. 18 year old son committed suicide in 1/20. 30 year old son, won't see her because his brother is gone. She is living with her Dad. She lost all her  friends when this happened. She has a support team.   Past Medical History:  Diagnosis Date  . Anxiety   . Arthritis    ruptured lumbar disc-careful with positioning  . Blood dyscrasia    protein s deficiency-no treatment since 2006  . Depression   . Diabetes mellitus without complication ([place])   . GERD (gastroesophageal reflux disease)   . Headache(784.0)   . Hemorrhoids   . High cholesterol   . History of fainting spells of unknown cause   . History of pulmonary embolism   . Hyperlipemia   . Hypertension   . Hypothyroidism   . IBS (irritable bowel syndrome)   . MVP (mitral valve prolapse)    echo per dr Dagmar Hait  . Protein S deficiency ([place]) 2003   dvt/pulmonary embolus-on coumadin for 6 months then off-Sees [facility]  . PVC (premature ventricular contraction)     Past Surgical History:  Procedure Laterality Date  . CESAREAN SECTION  2006  . CHOLECYSTECTOMY N/A 11/03/2014   Procedure: LAPAROSCOPIC CHOLECYSTECTOMY WITH INTRAOPERATIVE CHOLANGIOGRAM;  Surgeon: Georganna Skeans, MD;  Location: [facility];  Service: General;  Laterality: N/A;  . GYNECOLOGIC CRYOSURGERY    . LAPAROSCOPIC ASSISTED VAGINAL HYSTERECTOMY  03/27/2011   Procedure: LAPAROSCOPIC ASSISTED VAGINAL HYSTERECTOMY;  Surgeon: Cyril Mourning, MD;  Location: [facility];  Service: Gynecology;  Laterality: N/A;  . SALPINGOOPHORECTOMY  03/27/2011   Procedure: SALPINGO OOPHERECTOMY;  Surgeon: Cyril Mourning, MD;  Location: [facility];  Service: Gynecology;  Laterality: Bilateral;  . TONSILLECTOMY  92  . VAGINAL RECONSTRUCTIVE SURGERY  2001    Current Outpatient Medications  Medication Sig Dispense Refill  . ALPRAZolam (XANAX XR) 2 MG 24 hr tablet Take 1 tablet (2 mg total) by mouth daily. For anxiety 5 tablet 0  . ALPRAZolam (XANAX) 0.5 MG tablet Take 1 tablet (0.5 mg total) by mouth 2 (two) times daily as needed for anxiety. 10 tablet 0  . ARIPiprazole (ABILIFY) 5 MG tablet Take 1 tablet (5 mg total) by mouth daily. 30  tablet 0  . aspirin EC 81 MG tablet Take 81 mg by mouth daily.    . colestipol (COLESTID) 1 g tablet TAKE 2 TABLETS(2 GRAMS) BY MOUTH TWICE DAILY 120 tablet 1  . esomeprazole (NEXIUM) 40 MG capsule Take 1 capsule (40 mg total) by mouth daily. For acid reflux 5 capsule 0  . ESTRACE VAGINAL 0.1 MG/GM vaginal cream Place vaginally 2 x a week at bedtime. 42.5 g 1  . ezetimibe (ZETIA) 10 MG tablet Take 1 tablet (10 mg total) by mouth daily. 90 tablet 3  . FLUoxetine (PROZAC) 40 MG capsule Take 1 capsule (40 mg total) by mouth daily. 30 capsule 0  . fluticasone (FLONASE) 50 MCG/ACT nasal spray Place 2 sprays into both nostrils daily. 16 g 6  . metoprolol tartrate (LOPRESSOR) 50 MG tablet Take 1 tablet (50 mg total) by mouth 2 (two) times daily. 60 tablet 3  . NONFORMULARY OR COMPOUNDED ITEM Place 1 suppository vaginally 2 (two) times a week.    . NONFORMULARY OR COMPOUNDED ITEM Apply 1 mL topically 2 (two) times daily.    . ondansetron (ZOFRAN) 4 MG tablet Take 1 tablet (4 mg total) by mouth every 8 (eight) hours as needed for nausea or vomiting. 20 tablet 3  . oxyCODONE-acetaminophen (PERCOCET/ROXICET) 5-325 MG tablet Take 1 tablet by mouth every 6 (six) hours as needed for severe pain.    . prazosin (MINIPRESS) 1 MG capsule Take 1 capsule (1 mg total) by mouth at bedtime. 30 capsule 0  . rosuvastatin (CRESTOR) 20 MG tablet Take one tablet by mouth once daily: For high cholesterol 5 tablet 0  . SYNTHROID 125 MCG tablet TAKE 1 TABLET(125 MCG) BY MOUTH DAILY BEFORE BREAKFAST: For thyroid hormone replacement 90 tablet 1  . traZODone (DESYREL) 50 MG tablet Take 1 tablet (50 mg total) by mouth at bedtime as needed for sleep. (Patient taking differently: Take 100 mg by mouth at bedtime as needed for sleep. ) 30 tablet 0  . ergocalciferol (VITAMIN D2) 1.25 MG (50000 UT) capsule Take 50,000 Units by mouth once a week.     No current facility-administered medications for this visit.     Family History   Problem Relation Age of Onset  . High blood pressure Mother   . Anxiety disorder Mother   . Depression Mother   . OCD Mother   . Physical abuse Mother   . Alcohol abuse Mother   . Hyperlipidemia Father   . High blood pressure Father   . Hypertension Father   . Cancer Father        skin  . ADD / ADHD Son   . Seizures Son   . ADD / ADHD Son   . Hypertension Other        entire family  on both sides  . Asthma Son   . Asthma Son   . Clotting disorder Maternal Uncle   . Clotting disorder Paternal Grandmother   . Heart disease Maternal Grandfather   . Alcohol abuse Maternal Grandfather   . Anxiety disorder Maternal Grandmother     Review of Systems  Constitutional:       Hot flashes Night sweats  HENT: Negative.   Eyes: Negative.   Respiratory: Negative.   Cardiovascular: Negative.   Gastrointestinal: Negative.   Endocrine: Negative.   Genitourinary:       Vaginal dryness  Musculoskeletal: Negative.   Skin: Negative.   Allergic/Immunologic: Negative.   Neurological: Negative.   Hematological: Negative.   Psychiatric/Behavioral: Positive for sleep disturbance and suicidal ideas. The patient is nervous/anxious.        Depression    Exam:   BP 138/82 (BP Location: Right Arm, Patient Position: Sitting, Cuff Size: Normal)   Pulse 68   Temp (!) 97.1 F (36.2 C) (Skin)   Wt 224 lb 9.6 oz (101.9 kg)   LMP 03/19/2011   BMI 33.17 kg/m   Weight change: @WEIGHTCHANGE @ Height:      Ht Readings from Last 3 Encounters:  02/01/19 5\' 9"  (1.753 m)  12/14/18 5' 6.25" (1.683 m)  10/29/18 5\' 8"  (1.727 m)    General appearance: alert, cooperative and appears stated age Head: Normocephalic, without obvious abnormality, atraumatic Neck: no adenopathy, supple, symmetrical, trachea midline and thyroid normal to inspection and palpation Lungs: clear to auscultation bilaterally Cardiovascular: regular rate and rhythm Breasts: normal appearance, no masses or tenderness Abdomen:  soft, non-tender; non distended,  no masses,  no organomegaly Extremities: extremities normal, atraumatic, no cyanosis or edema Skin: Skin color, texture, turgor normal. No rashes or lesions Lymph nodes: Cervical, supraclavicular, and axillary nodes normal. No abnormal inguinal nodes palpated Neurologic: Grossly normal   Pelvic: External genitalia:  no lesions              Urethra:  normal appearing urethra with no masses, tenderness or lesions              Bartholins and Skenes: normal                 Vagina: atrophic appearing vagina with normal color and discharge, no lesions              Cervix: absent               Bimanual Exam:  Uterus:  uterus absent              Adnexa: no mass, fullness, tenderness               Rectovaginal: Confirms               Anus:  normal sphincter tone, no lesions  Chaperone was present for exam.  A:  Well Woman with normal exam  Severe depression since the loss of her son earlier this year  H/O hysterectomy/BSO at 32  Protein S def, h/o PE  On biest cream  C/O vaginal atrophy  H/O recurrent BV  Chronic vaginal irritation, I suspect a lot of this is from her atrophy.   H/O cervical dysplasia  P:   Pap from vaginal apex  F/U with Psychiatry and her counselor for management of her depression.   Will continue vaginal estrogen, I think the risk is very low and the symptoms are very bothersome  Can use replens vaginal moisturizer  Stop the 2 x a week boric acid  I'm not comfortable refilling the biest with her protein S deficiency. She is resistant to go off so I will get the opinion of Hematology    In additionally to the annual exam an additional 30 minutes was spent discussing her depression, over 50% in counseling.   Addendum: nuswab vaginitis panel sent.

## 2019-04-12 NOTE — Patient Instructions (Signed)
EXERCISE AND DIET:  We recommended that you start or continue a regular exercise program for good health. Regular exercise means any activity that makes your heart beat faster and makes you sweat.  We recommend exercising at least 30 minutes per day at least 3 days a week, preferably 4 or 5.  We also recommend a diet low in fat and sugar.  Inactivity, poor dietary choices and obesity can cause diabetes, heart attack, stroke, and kidney damage, among others.    ALCOHOL AND SMOKING:  Women should limit their alcohol intake to no more than 7 drinks/beers/glasses of wine (combined, not each!) per week. Moderation of alcohol intake to this level decreases your risk of breast cancer and liver damage. And of course, no recreational drugs are part of a healthy lifestyle.  And absolutely no smoking or even second hand smoke. Most people know smoking can cause heart and lung diseases, but did you know it also contributes to weakening of your bones? Aging of your skin?  Yellowing of your teeth and nails?  CALCIUM AND VITAMIN D:  Adequate intake of calcium and Vitamin D are recommended.  The recommendations for exact amounts of these supplements seem to change often, but generally speaking 1,200 mg of calcium (between diet and supplement) and 800 units of Vitamin D per day seems prudent. Certain women may benefit from higher intake of Vitamin D.  If you are among these women, your doctor will have told you during your visit.    PAP SMEARS:  Pap smears, to check for cervical cancer or precancers,  have traditionally been done yearly, although recent scientific advances have shown that most women can have pap smears less often.  However, every woman still should have a physical exam from her gynecologist every year. It will include a breast check, inspection of the vulva and vagina to check for abnormal growths or skin changes, a visual exam of the cervix, and then an exam to evaluate the size and shape of the uterus and  ovaries.  And after 45 years of age, a rectal exam is indicated to check for rectal cancers. We will also provide age appropriate advice regarding health maintenance, like when you should have certain vaccines, screening for sexually transmitted diseases, bone density testing, colonoscopy, mammograms, etc.   MAMMOGRAMS:  All women over 40 years old should have a yearly mammogram. Many facilities now offer a "3D" mammogram, which may cost around $50 extra out of pocket. If possible,  we recommend you accept the option to have the 3D mammogram performed.  It both reduces the number of women who will be called back for extra views which then turn out to be normal, and it is better than the routine mammogram at detecting truly abnormal areas.    COLON CANCER SCREENING: Now recommend starting at age 45. At this time colonoscopy is not covered for routine screening until 50. There are take home tests that can be done between 45-49.   COLONOSCOPY:  Colonoscopy to screen for colon cancer is recommended for all women at age 50.  We know, you hate the idea of the prep.  We agree, BUT, having colon cancer and not knowing it is worse!!  Colon cancer so often starts as a polyp that can be seen and removed at colonscopy, which can quite literally save your life!  And if your first colonoscopy is normal and you have no family history of colon cancer, most women don't have to have it again for   10 years.  Once every ten years, you can do something that may end up saving your life, right?  We will be happy to help you get it scheduled when you are ready.  Be sure to check your insurance coverage so you understand how much it will cost.  It may be covered as a preventative service at no cost, but you should check your particular policy.      Breast Self-Awareness Breast self-awareness means being familiar with how your breasts look and feel. It involves checking your breasts regularly and reporting any changes to your  health care provider. Practicing breast self-awareness is important. A change in your breasts can be a sign of a serious medical problem. Being familiar with how your breasts look and feel allows you to find any problems early, when treatment is more likely to be successful. All women should practice breast self-awareness, including women who have had breast implants. How to do a breast self-exam One way to learn what is normal for your breasts and whether your breasts are changing is to do a breast self-exam. To do a breast self-exam: Look for Changes  1. Remove all the clothing above your waist. 2. Stand in front of a mirror in a room with good lighting. 3. Put your hands on your hips. 4. Push your hands firmly downward. 5. Compare your breasts in the mirror. Look for differences between them (asymmetry), such as: ? Differences in shape. ? Differences in size. ? Puckers, dips, and bumps in one breast and not the other. 6. Look at each breast for changes in your skin, such as: ? Redness. ? Scaly areas. 7. Look for changes in your nipples, such as: ? Discharge. ? Bleeding. ? Dimpling. ? Redness. ? A change in position. Feel for Changes Carefully feel your breasts for lumps and changes. It is best to do this while lying on your back on the floor and again while sitting or standing in the shower or tub with soapy water on your skin. Feel each breast in the following way:  Place the arm on the side of the breast you are examining above your head.  Feel your breast with the other hand.  Start in the nipple area and make  inch (2 cm) overlapping circles to feel your breast. Use the pads of your three middle fingers to do this. Apply light pressure, then medium pressure, then firm pressure. The light pressure will allow you to feel the tissue closest to the skin. The medium pressure will allow you to feel the tissue that is a little deeper. The firm pressure will allow you to feel the tissue  close to the ribs.  Continue the overlapping circles, moving downward over the breast until you feel your ribs below your breast.  Move one finger-width toward the center of the body. Continue to use the  inch (2 cm) overlapping circles to feel your breast as you move slowly up toward your collarbone.  Continue the up and down exam using all three pressures until you reach your armpit.  Write Down What You Find  Write down what is normal for each breast and any changes that you find. Keep a written record with breast changes or normal findings for each breast. By writing this information down, you do not need to depend only on memory for size, tenderness, or location. Write down where you are in your menstrual cycle, if you are still menstruating. If you are having trouble noticing differences   in your breasts, do not get discouraged. With time you will become more familiar with the variations in your breasts and more comfortable with the exam. How often should I examine my breasts? Examine your breasts every month. If you are breastfeeding, the best time to examine your breasts is after a feeding or after using a breast pump. If you menstruate, the best time to examine your breasts is 5-7 days after your period is over. During your period, your breasts are lumpier, and it may be more difficult to notice changes. When should I see my health care provider? See your health care provider if you notice:  A change in shape or size of your breasts or nipples.  A change in the skin of your breast or nipples, such as a reddened or scaly area.  Unusual discharge from your nipples.  A lump or thick area that was not there before.  Pain in your breasts.  Anything that concerns you.  

## 2019-04-13 DIAGNOSIS — N898 Other specified noninflammatory disorders of vagina: Secondary | ICD-10-CM | POA: Diagnosis not present

## 2019-04-13 NOTE — Addendum Note (Signed)
Addended by: Dorothy Spark on: 04/13/2019 01:16 PM   Modules accepted: Orders

## 2019-04-14 ENCOUNTER — Encounter (HOSPITAL_COMMUNITY): Payer: Self-pay | Admitting: Psychiatry

## 2019-04-14 ENCOUNTER — Ambulatory Visit (INDEPENDENT_AMBULATORY_CARE_PROVIDER_SITE_OTHER): Payer: Medicare HMO | Admitting: Psychiatry

## 2019-04-14 ENCOUNTER — Other Ambulatory Visit: Payer: Self-pay

## 2019-04-14 ENCOUNTER — Ambulatory Visit (HOSPITAL_COMMUNITY): Payer: Medicare HMO | Admitting: Psychiatry

## 2019-04-14 DIAGNOSIS — F4310 Post-traumatic stress disorder, unspecified: Secondary | ICD-10-CM | POA: Diagnosis not present

## 2019-04-14 DIAGNOSIS — R69 Illness, unspecified: Secondary | ICD-10-CM | POA: Diagnosis not present

## 2019-04-14 DIAGNOSIS — F332 Major depressive disorder, recurrent severe without psychotic features: Secondary | ICD-10-CM | POA: Diagnosis not present

## 2019-04-14 LAB — CYTOLOGY - PAP
Comment: NEGATIVE
Diagnosis: NEGATIVE
High risk HPV: NEGATIVE

## 2019-04-14 NOTE — Progress Notes (Signed)
Virtual Visit via Video Note  I connected with [name] B. [place]avan on 04/14/19 at  1:00 PM EST by a video enabled telemedicine application and verified that I am speaking with the correct person using two identifiers.  Location: Patient: Andrea [name] Provider: Lise Auer, LCSW   I discussed the limitations of evaluation and management by telemedicine and the availability of in person appointments. The patient expressed understanding and agreed to proceed.  History of Present Illness: PTSD and MDD   Observations/Objective: Counselor met with [name] for individual therapy via Webex. Counselor assessed MH symptoms and progress on treatment plan goals. Zeta presents with moderate anxiety and moderate depression. Yani denied suicidal ideation or self-harm behaviors. Omaria holds that she would be ok with death by natural causes, giving the example of a doctor telling her she has a month to live. Meredith shared that over the past week she has been involved in grief and loss support group, volunteering, peer support, anger management, seasonal course on "Attitude of Gratitude". She has connected with a friend to discuss alternative housing, as well as seeking resources for housing and other needs at the [employer]. Counselor and [place] processed a stressful situation experienced at [facility]. 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 Counselor assessed daily functioning and connections with her primary support. [place] discussed triggers and her action plan to prevent exposure to and handle triggering situations. Counselor and [place] spent remaining time preparing for the long holiday weekend, identifying helpful coping skills and strategies to implement as she experiences her first Thanksgiving without her son. Mozell expressed gratitude for the support from mental health providers, affirmed that she knows how to access crisis services and will follow her safety plan.    Assessment and Plan: Counselor will continue to meet with patient to address treatment plan goals. Patient will continue to follow recommendations of providers and implement skills learned in session.  Follow Up Instructions: Counselor will send information for next session via Webex.     I discussed the assessment and treatment plan with the patient. The patient was provided an opportunity to ask questions and all were answered. The patient agreed with the plan and demonstrated an understanding of the instructions.   The patient was advised to call back or seek an in-person evaluation if the symptoms worsen or if the condition fails to improve as anticipated.  I provided 60 minutes of non-face-to-face time during this encounter.   Lise Auer, LCSW

## 2019-04-15 ENCOUNTER — Ambulatory Visit (HOSPITAL_COMMUNITY): Payer: Medicare HMO | Admitting: Psychiatry

## 2019-04-19 LAB — NUSWAB VAGINITIS (VG)
Candida albicans, NAA: NEGATIVE
Candida glabrata, NAA: NEGATIVE
Trich vag by NAA: NEGATIVE

## 2019-04-21 ENCOUNTER — Ambulatory Visit (INDEPENDENT_AMBULATORY_CARE_PROVIDER_SITE_OTHER): Payer: Medicare HMO | Admitting: Psychiatry

## 2019-04-21 ENCOUNTER — Other Ambulatory Visit: Payer: Self-pay

## 2019-04-21 ENCOUNTER — Encounter (HOSPITAL_COMMUNITY): Payer: Self-pay | Admitting: Psychiatry

## 2019-04-21 DIAGNOSIS — F4310 Post-traumatic stress disorder, unspecified: Secondary | ICD-10-CM

## 2019-04-21 DIAGNOSIS — R69 Illness, unspecified: Secondary | ICD-10-CM | POA: Diagnosis not present

## 2019-04-21 DIAGNOSIS — F332 Major depressive disorder, recurrent severe without psychotic features: Secondary | ICD-10-CM | POA: Diagnosis not present

## 2019-04-21 NOTE — Progress Notes (Signed)
Virtual Visit via Video Note  I connected with Andrea [name] on 04/21/19 at  2:00 PM EST by a video enabled telemedicine application and verified that I am speaking with the correct person using two identifiers.  Location: Patient: Andrea [name] Provider: Lise Auer, LCSW   I discussed the limitations of evaluation and management by telemedicine and the availability of in person appointments. The patient expressed understanding and agreed to proceed.  History of Present Illness: PTSD and MDD   Observations/Objective: Counselor met with Andrea [name] for individual therapy via Webex. Counselor assessed MH symptoms and progress on treatment plan goals. Andrea [name] presents with severe depression and high anxiety. Andrea [name] denied suicidal ideation or self-harm behaviors, however, continues to state contentment if her life were to end naturally due to her desire to reconnect with her son who passed by suicide earlier this year. Andrea [name] shared that she made attempts to gain understanding and answers related to the role her sons paternal grandfather played in his final weeks. Counselor processed thoughts and feelings on the conversation and response. Andrea [name] expressed anger, resentment, frustration and grief. Counselor used CBT interventions to address current cognitions. Andrea [name] identified that she is experiencing prolonged disassociations, flashbacks and increased trauma triggers. Counselor assessed trauma responses and coping skills. Andrea [name] has been engaging in support groups, wellness classes, volunteering and working out. She stated that due to increased triggers related to holiday traditions and children she prefers to stay in as much as possible to avoid emotional breakdowns. Andrea [name] discussed recommendation from Hospice to engage in EMDR therapy. Counselor and Andrea [name] discussed treatment goals in relation to this mode of therapy and best timing. Counselor and Andrea [name] planned for upcoming  appointments and strategically planned for adding additional supports during the holiday season.    Assessment and Plan: Counselor will continue to meet with patient to address treatment plan goals. Patient will continue to follow recommendations of providers and implement skills learned in session.  Follow Up Instructions: Counselor will send information for next session via Webex.     I discussed the assessment and treatment plan with the patient. The patient was provided an opportunity to ask questions and all were answered. The patient agreed with the plan and demonstrated an understanding of the instructions.   The patient was advised to call back or seek an in-person evaluation if the symptoms worsen or if the condition fails to improve as anticipated.  I provided 60 minutes of non-face-to-face time during this encounter.   Lise Auer, LCSW

## 2019-04-22 ENCOUNTER — Telehealth: Payer: Self-pay | Admitting: General Practice

## 2019-04-22 ENCOUNTER — Encounter: Payer: Self-pay | Admitting: Obstetrics and Gynecology

## 2019-04-22 NOTE — Telephone Encounter (Signed)
Pt doesn't have any more meds and needs her refill for SYNTHROID 125 MCG tablet to be sent to the correct pharmacy it needs to be sent to  [facility] #[id number] - [place], [place] [phone] (Phone) [phone] (Fax)

## 2019-04-22 NOTE — Telephone Encounter (Signed)
See note

## 2019-04-22 NOTE — Telephone Encounter (Signed)
Patient has an upcoming appt schedule with you on 07/12/2019. Ok to send Synthroid to correct pharmacy?

## 2019-04-23 ENCOUNTER — Telehealth: Payer: Self-pay | Admitting: Obstetrics and Gynecology

## 2019-04-23 ENCOUNTER — Ambulatory Visit: Payer: Medicare HMO | Admitting: Internal Medicine

## 2019-04-23 NOTE — Telephone Encounter (Signed)
Lab Results  Component Value Date   TSH 0.71 12/14/2018   Yes, please

## 2019-04-23 NOTE — Telephone Encounter (Signed)
Per review of 04/12/19 OV notes and result notes, waiting for hematology recommendations regarding Biest.   Routing to Dr. Talbert Nan to advise.

## 2019-04-23 NOTE — Telephone Encounter (Signed)
Patient sent the following correspondence through [facility].  Hello Dr. Talbert Nan   If you have to stop the Biest, will you prescribe something for the hot flashes? I don't know much about Gabapentin, but you mentioned that may help. Thank you so much and it was a pleasure meeting you and I look forward to you following my gyn care.  Sincerely,   Andrea [name]

## 2019-04-24 ENCOUNTER — Telehealth: Payer: Self-pay | Admitting: Family Medicine

## 2019-04-24 DIAGNOSIS — E034 Atrophy of thyroid (acquired): Secondary | ICD-10-CM

## 2019-04-24 MED ORDER — SYNTHROID 125 MCG PO TABS: ORAL_TABLET | ORAL | 1 refills | Status: DC

## 2019-04-24 NOTE — Telephone Encounter (Signed)
On call message received that patient has not received her thyroid medication. She called clinic and was told it would be sent in for her.   Of note, this patient was recommended to be dismissed from clinic by dr. Jonni Sanger who approved her thyroid refill. She has transfer of care to dr. Jonni Sanger coming up, but in November dr. Jonni Sanger recommended she be dismissed from clinic. Forwarding to dr. Jonni Sanger to review this log note.  Orma Flaming, MD [place]

## 2019-04-26 ENCOUNTER — Telehealth: Payer: Self-pay | Admitting: General Practice

## 2019-04-26 ENCOUNTER — Encounter: Payer: Self-pay | Admitting: General Practice

## 2019-04-26 ENCOUNTER — Other Ambulatory Visit: Payer: Self-pay

## 2019-04-26 ENCOUNTER — Other Ambulatory Visit: Payer: Self-pay | Admitting: Family Medicine

## 2019-04-26 DIAGNOSIS — E034 Atrophy of thyroid (acquired): Secondary | ICD-10-CM

## 2019-04-26 MED ORDER — SYNTHROID 125 MCG PO TABS: ORAL_TABLET | ORAL | 0 refills | Status: DC
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
## 2019-04-26 NOTE — Telephone Encounter (Signed)
Synthroid sent to the correct pharmacy. LVM notifying patient and to call back if there are any questions or concerns.

## 2019-04-26 NOTE — Telephone Encounter (Signed)
Patient has received her medication.

## 2019-04-26 NOTE — Telephone Encounter (Signed)
[facility] RECORD AccessNurse Patient Name: Andrea [name] Gender: Female DOB: Mar 29, 1976 Age: 43 Y 1 M 11 D Return Phone Number: [phone] (Primary) Address: City/State/Zip: [place] [address] Client [facility] Client Site [facility] Night Physician Briscoe Deutscher- DO Contact Type Call Who Is Calling Patient / Member / Family / Caregiver Call Type Triage / Clinical Relationship To Patient Self Return Phone Number [phone] (Primary) Chief Complaint Heart palpitations or irregular heartbeat Reason for Call Symptomatic / Request for Health Information Initial Comment Caller requesting thyroid medication refilled. She has a headache and her heart is racing; she has been off of it for the past few days. Translation No Nurse Assessment Nurse: Fredderick Phenix, RN, Lelan Pons Date/Time Eilene Ghazi Time): 04/24/2019 2:37:22 PM Confirm and document reason for call. If symptomatic, describe symptoms. ---Caller requesting thyroid medication refilled. She has a headache and her heart is racing; she has been off of it for the past few days. Was seen in October for bloodwork and was supposed to be seen. Has the patient had close contact with a person known or suspected to have the novel coronavirus illness OR traveled / lives in area with major community spread (including international travel) in the last 14 days from the onset of symptoms? * If Asymptomatic, screen for exposure and travel within the last 14 days. ---No Does the patient have any new or worsening symptoms? ---Yes Will a triage be completed? ---Yes Related visit to physician within the last 2 weeks? ---No Does the PT have any chronic conditions? (i.e. diabetes, asthma, this includes High risk factors for pregnancy, etc.) ---Yes List chronic conditions. ---HTN, hypothyroidism Is the patient pregnant or  possibly pregnant? (Ask all females between the ages of 71-55) ---No Is this a behavioral health or substance abuse call? ---No Guidelines Guideline Title Affirmed Question Affirmed Notes Nurse Date/Time (Eastern Time) Heart Rate and Heartbeat Questions [1] Dizziness, lightheadedness, or Fredderick Phenix, RN, Lelan Pons 04/24/2019 2:43:01 PM PLEASE NOTE: All timestamps contained within this report are represented as [place] Standard Time. CONFIDENTIALTY NOTICE: This fax transmission is intended only for the addressee. It contains information that is legally privileged, confidential or otherwise protected from use or disclosure. If you are not the intended recipient, you are strictly prohibited from reviewing, disclosing, copying using or disseminating any of this information or taking any action in reliance on or regarding this information. If you have received this fax in error, please notify us immediately by telephone so that we can arrange for its return to [place]. Phone: [phone], Toll-Free: [phone], Fax: [phone] Page: 2 of 2 Call Id: [id number] Guidelines Guideline Title Affirmed Question Affirmed Notes Nurse Date/Time Eilene Ghazi Time) weakness AND [2] heart beating very rapidly (e.g., > 140 / minute) Disp. Time Eilene Ghazi Time) Disposition Final User 04/24/2019 2:11:32 PM Attempt made - message left Stormy Card 04/24/2019 3:09:59 PM Called On-Call Provider Fredderick Phenix, RN, Lelan Pons 04/24/2019 2:49:55 PM Call EMS 911 Now Yes Fredderick Phenix, RN, [place] Disagree/Comply Disagree Caller Understands Yes PreDisposition InappropriateToAsk Care Advice Given Per Guideline CALL EMS 911 NOW: * Immediate medical attention is needed. You need to hang up and call 911 (or an ambulance). CARE ADVICE given per Heart Rate and Heartbeat Questions (Adult) guideline. Paging DoctorName Phone DateTime Result/Outcome Message Type Notes Orma Flaming- MD [id number] 04/24/2019 3:09:59 PM Called On Call Provider -  Reached Doctor Paged Orma Flaming- MD 04/24/2019 3:10:07 PM [facility] Msg Received -  Rx - Provider to Call in [place]

## 2019-04-26 NOTE — Telephone Encounter (Signed)
This has been completed and mailed out.

## 2019-04-28 ENCOUNTER — Other Ambulatory Visit: Payer: Self-pay

## 2019-04-28 ENCOUNTER — Encounter (HOSPITAL_COMMUNITY): Payer: Self-pay | Admitting: Psychiatry

## 2019-04-28 ENCOUNTER — Ambulatory Visit (INDEPENDENT_AMBULATORY_CARE_PROVIDER_SITE_OTHER): Payer: Medicare HMO | Admitting: Psychiatry

## 2019-04-28 DIAGNOSIS — F4310 Post-traumatic stress disorder, unspecified: Secondary | ICD-10-CM

## 2019-04-28 DIAGNOSIS — R69 Illness, unspecified: Secondary | ICD-10-CM | POA: Diagnosis not present

## 2019-04-28 DIAGNOSIS — F332 Major depressive disorder, recurrent severe without psychotic features: Secondary | ICD-10-CM

## 2019-04-28 NOTE — Progress Notes (Signed)
Virtual Visit via Video Note  I connected with Andrea [name] on 04/28/19 at  3:00 PM EST by a video enabled telemedicine application and verified that I am speaking with the correct person using two identifiers.  Location: Patient: Andrea [name] Provider: Lise Auer, LCSW   I discussed the limitations of evaluation and management by telemedicine and the availability of in person appointments. The patient expressed understanding and agreed to proceed.  History of Present Illness: PTSD and MDD   Observations/Objective: Counselor met with Andrea [name] for individual therapy via Webex. Counselor assessed MH symptoms and progress on treatment plan goals. Andrea [name] presents as moderately depression, high anxiety and daily experiences with trauma triggers, including flashbacks, disassociating, increased agoraphobia and avoidance of reminders. Andrea [name] denied suicidal ideation or self-harm behaviors and continues to endorse being at peace if she were to pass from natural causes or unforeseeable accidents. Andrea [name] shared that she met with her psychiatrist and adjusted meds due to anticipation of the holiday season being more difficult to cope with. Counselor assessed daily functioning, with Andrea [name] stating that she is attempting to stay home more than usual to avoid triggers. Andrea [name] leaves home to attend support groups, volunteering and classes at [facility]. Counselor and Andrea [name] explored and processed current grief and loss symptoms. Andrea [name] identified that she is "exhausted from seeking blame or purpose" of son completing suicide. She would like to complete self-work and self-reflection on this topic through journaling between now and next session. Counselor and Andrea [name] identified additional coping strategies to utilize in preparing for potential holiday trauma triggers. Counselor and Andrea [name] discussed EMDR treatment as a future therapeutic intervention to process trauma differently.   Assessment and  Plan: Counselor will continue to meet with patient to address treatment plan goals. Patient will continue to follow recommendations of providers and implement skills learned in session.  Follow Up Instructions: Counselor will send information for next session via Webex.    I discussed the assessment and treatment plan with the patient. The patient was provided an opportunity to ask questions and all were answered. The patient agreed with the plan and demonstrated an understanding of the instructions.   The patient was advised to call back or seek an in-person evaluation if the symptoms worsen or if the condition fails to improve as anticipated.  I provided 55 minutes of non-face-to-face time during this encounter.   Lise Auer, LCSW

## 2019-04-29 NOTE — Telephone Encounter (Signed)
Please let the patient know that I haven't heard back from Hematology, but I really think the biest isn't safe for her given her thrombophilia (protein S def).  She is already on Prozac, which is probably helping some. She can try the gabapentin, the limiting factor with the gabapentin is fatigue.  Please let her know that we start with a very low dose and increase as tolerated.  If she would like to try it, please call in Gabapentin 100 mg, start with 100 mg po qhs, she can increase by 1 tablet every 3 nights until she gets to 300 mg (3 tablets) at night (1 hour prior to bedtime). Depending on how she is feeling, she can then add 100 mg po qam 3 days after she has gotten to 300 mg at night.  Please call in 90 tablets. Have her schedule a virtual visit in 2 weeks to see how she is doing.

## 2019-04-29 NOTE — Telephone Encounter (Signed)
Call to patient, no answer, mailbox full. ?

## 2019-04-29 NOTE — Telephone Encounter (Signed)
Patient returned call

## 2019-04-30 NOTE — Telephone Encounter (Signed)
Left message to call Graycie Halley, RN at [facility] [phone].   

## 2019-05-05 ENCOUNTER — Ambulatory Visit (HOSPITAL_COMMUNITY): Payer: Medicare HMO | Admitting: Psychiatry

## 2019-05-05 NOTE — Telephone Encounter (Signed)
Left message to call Asha Grumbine, RN at [facility] [phone].   

## 2019-05-06 ENCOUNTER — Other Ambulatory Visit: Payer: Self-pay

## 2019-05-06 ENCOUNTER — Ambulatory Visit (INDEPENDENT_AMBULATORY_CARE_PROVIDER_SITE_OTHER): Payer: Medicare HMO | Admitting: Psychiatry

## 2019-05-06 DIAGNOSIS — R4589 Other symptoms and signs involving emotional state: Secondary | ICD-10-CM | POA: Diagnosis not present

## 2019-05-06 DIAGNOSIS — F332 Major depressive disorder, recurrent severe without psychotic features: Secondary | ICD-10-CM | POA: Diagnosis not present

## 2019-05-06 DIAGNOSIS — F4310 Post-traumatic stress disorder, unspecified: Secondary | ICD-10-CM

## 2019-05-06 DIAGNOSIS — R69 Illness, unspecified: Secondary | ICD-10-CM | POA: Diagnosis not present

## 2019-05-06 NOTE — Progress Notes (Signed)
Virtual Visit via Video Note  I connected with Andrea [name] on 05/06/19 at  2:00 PM EST by a video enabled telemedicine application and verified that I am speaking with the correct person using two identifiers.  Location: Patient: Andrea [name] Provider: Lise Auer, LCSW   I discussed the limitations of evaluation and management by telemedicine and the availability of in person appointments. The patient expressed understanding and agreed to proceed.  History of Present Illness: MDD and PTSD   Observations/Objective: Counselor met with Andrea Malta for individual therapy via Webex. Counselor assessed MH symptoms and progress on treatment plan goals. Concepcion presents with severe depression and severe anxiety, along with daily trauma triggers and responses. Christne denied suicidal plan or self-harm behaviors, but continues to endorse thoughts of contentment with death and dying. Ranie shared that she engaged in [employer], Peer Support, and Grief and Loss Groups since our last session. Natosha reported on a negative interaction with a church staff that was triggering and caused intense anger. Counselor processed thoughts and emotions connected with her since of identity, advocating for self and beliefs, empathy with her son's experience and how to balance emotions and needs. Counselor engaged [place] in therapeutic intervention related to grief and loss, in preparation for upcoming holiday's without her son, due to his passing. Counselor and Karia identified a variety of coping skills and strategies to combat depressive/manic responses to her loss. Kamaljit expressed a need to process challenges she is facing around self-image, self-esteem and self-care. Counselor provided psychoeducation and therapy homework to address these concerns.   Assessment and Plan: Counselor will continue to meet with patient to address treatment plan goals. Patient will continue to follow recommendations of providers  and implement skills learned in session.  Follow Up Instructions: Counselor will send information for next session via Webex.     I discussed the assessment and treatment plan with the patient. The patient was provided an opportunity to ask questions and all were answered. The patient agreed with the plan and demonstrated an understanding of the instructions.   The patient was advised to call back or seek an in-person evaluation if the symptoms worsen or if the condition fails to improve as anticipated.  I provided 60 minutes of non-face-to-face time during this encounter.   Lise Auer, LCSW

## 2019-05-07 MED ORDER — GABAPENTIN 100 MG PO CAPS: ORAL_CAPSULE | ORAL | 0 refills | Status: DC

## 2019-05-07 NOTE — Telephone Encounter (Signed)
Spoke with patient, advised per Dr. Talbert Nan. Patient reports she is no longer taking biest. Patient request RX to pharmacy on file. MyChart visit scheduled for 05/27/19 at 4:30pm.   Patient tearful, talked about her son who passed this past year. Patient feels safe, denies SI/HI.  Patient is aware to return call to office if any additional assistance needed. Patient thankful for call.   Routing to provider for final review. Patient is agreeable to disposition. Will close encounter.

## 2019-05-11 ENCOUNTER — Encounter (HOSPITAL_COMMUNITY): Payer: Self-pay | Admitting: Psychiatry

## 2019-05-12 ENCOUNTER — Other Ambulatory Visit: Payer: Self-pay

## 2019-05-12 ENCOUNTER — Ambulatory Visit (HOSPITAL_COMMUNITY): Payer: Medicare HMO | Admitting: Psychiatry

## 2019-05-12 DIAGNOSIS — F332 Major depressive disorder, recurrent severe without psychotic features: Secondary | ICD-10-CM

## 2019-05-12 DIAGNOSIS — F4310 Post-traumatic stress disorder, unspecified: Secondary | ICD-10-CM

## 2019-05-12 DIAGNOSIS — R4589 Other symptoms and signs involving emotional state: Secondary | ICD-10-CM

## 2019-05-13 ENCOUNTER — Encounter (HOSPITAL_COMMUNITY): Payer: Self-pay | Admitting: Psychiatry

## 2019-05-13 NOTE — Progress Notes (Signed)
Virtual Visit via Video Note  I connected with Andrea [name] on 05/13/19 at  3:00 PM EST by a video enabled telemedicine application and verified that I am speaking with the correct person using two identifiers.  Location: Patient: Andrea [name] Provider: Lise Auer, LCSW   I discussed the limitations of evaluation and management by telemedicine and the availability of in person appointments. The patient expressed understanding and agreed to proceed.  History of Present Illness: PTSD, MDD and Difficulty Coping   Observations/Objective: Counselor met with Andrea Malta for individual therapy via Webex. Counselor assessed MH symptoms and progress on treatment plan goals. Jenette presents with severe depression, high anxiety and increased PTSD symptoms, such as re-experiencing, flashbacks, avoidance, and anger. [place] denied plan to act on suicidal ideations, but continues to have intrusive thoughts of death and dying. Alasha denies self-harm behaviors. Oliwia shared that she is focusing energy on caring for her late sons dog to divert from severe depression. Counselor assessed daily functioning. Pearley is continuing to connect with support groups, [employer] courses, and peer support. Sharron expressed concerns about medications running out before she feels comfortable leaving the house due to avoidance of triggers. Counselor encouraged adherence to medications as prescribed and to contact provider and pharmacy for further guidance. Counselor and Kanijah processed issues within her support system and plan for future housing. Avenly is aware of crisis numbers and providers in the area if her depression/ptsd worsens over the holiday weekend. She has a plan to avoid triggers by remaining at home, resting, journaling, and planning for her upcoming trip with a friend.   Assessment and Plan: Counselor will continue to meet with patient to address treatment plan goals. Patient will continue to follow  recommendations of providers and implement skills learned in session.  Follow Up Instructions: Counselor will send information for next session via Webex.     I discussed the assessment and treatment plan with the patient. The patient was provided an opportunity to ask questions and all were answered. The patient agreed with the plan and demonstrated an understanding of the instructions.   The patient was advised to call back or seek an in-person evaluation if the symptoms worsen or if the condition fails to improve as anticipated.  I provided 55 minutes of non-face-to-face time during this encounter.   Lise Auer, LCSW

## 2019-05-20 ENCOUNTER — Ambulatory Visit (HOSPITAL_COMMUNITY): Payer: Medicare HMO | Admitting: Psychiatry

## 2019-05-20 ENCOUNTER — Other Ambulatory Visit: Payer: Self-pay

## 2019-05-24 ENCOUNTER — Other Ambulatory Visit: Payer: Self-pay

## 2019-05-24 ENCOUNTER — Ambulatory Visit: Payer: Medicare HMO | Attending: Family Medicine | Admitting: Family Medicine

## 2019-05-24 ENCOUNTER — Ambulatory Visit: Payer: Medicare HMO | Admitting: Licensed Clinical Social Worker

## 2019-05-24 VITALS — BP 140/92 | HR 68 | Temp 98.0°F | Ht 69.0 in | Wt 220.0 lb

## 2019-05-24 DIAGNOSIS — E034 Atrophy of thyroid (acquired): Secondary | ICD-10-CM

## 2019-05-24 DIAGNOSIS — I10 Essential (primary) hypertension: Secondary | ICD-10-CM

## 2019-05-24 DIAGNOSIS — E785 Hyperlipidemia, unspecified: Secondary | ICD-10-CM

## 2019-05-24 DIAGNOSIS — F4310 Post-traumatic stress disorder, unspecified: Secondary | ICD-10-CM

## 2019-05-24 DIAGNOSIS — F332 Major depressive disorder, recurrent severe without psychotic features: Secondary | ICD-10-CM

## 2019-05-24 DIAGNOSIS — R7303 Prediabetes: Secondary | ICD-10-CM

## 2019-05-24 DIAGNOSIS — R69 Illness, unspecified: Secondary | ICD-10-CM | POA: Diagnosis not present

## 2019-05-24 LAB — POCT GLYCOSYLATED HEMOGLOBIN (HGB A1C): HbA1c, POC (controlled diabetic range): 5.5 % (ref 0.0–7.0)

## 2019-05-24 MED ORDER — EZETIMIBE 10 MG PO TABS: 10.0000 mg | ORAL_TABLET | Freq: Every day | ORAL | 1 refills | Status: DC

## 2019-05-24 MED ORDER — METOPROLOL TARTRATE 50 MG PO TABS: 50.0000 mg | ORAL_TABLET | Freq: Two times a day (BID) | ORAL | 1 refills | Status: DC

## 2019-05-24 MED ORDER — ROSUVASTATIN CALCIUM 20 MG PO TABS: ORAL_TABLET | ORAL | 1 refills | Status: DC

## 2019-05-24 MED ORDER — SYNTHROID 125 MCG PO TABS: ORAL_TABLET | ORAL | 1 refills | Status: DC

## 2019-05-24 NOTE — Patient Instructions (Signed)
Once I review your test results, I will be in touch with you via 'my chart' or a phone call from my office (if you are not signed up for my chart).  

## 2019-05-24 NOTE — Progress Notes (Signed)
Subjective:  Patient ID: Andrea [name], female    DOB: [birth year]  Age: 44 y.o. MRN: [id number]  CC: New Patient (Initial Visit)   HPI Andrea [name]. Lundrigan is a 44 year old female with a history of hypertension, hyperlipidemia, anxiety and depression, PTSD who presents today to establish care.  Previously followed by Dr Juleen China of [facility] until Dr Juleen China left the Practice. She lost her Son to suicide Jan last year and the last 1 year has been difficult for her.  She has a Social worker and attends therapy once/week She has had previous suicidal attempts and has been hospitalized for same; does not feel like hurting herself but sometimes wishes she were not here so she could be with her son. She goes to grief share at church and also goes to [facility] and these have been beneficial.  Also followed by Dr Toy Care of psychiatry. Her major support is her father whom she lives with and her church.  She only takes Synthroid brand name as the generic "throws her level off"; her last thyroid panel was normal.  Also on Crestor and Zetia for hyperlipidemia. She uses Zofran as needed for nausea Her blood pressure is elevated today and she attributes this to drinking coffee today but states she had been taking her antihypertensive prior to this visit.  Compliant with her statin and Zetia.  Past Medical History:  Diagnosis Date  . Anxiety   . Arthritis    ruptured lumbar disc-careful with positioning  . Blood dyscrasia    protein s deficiency-no treatment since 2006  . Depression   . Diabetes mellitus without complication ([place])   . GERD (gastroesophageal reflux disease)   . Headache(784.0)   . Hemorrhoids   . High cholesterol   . History of fainting spells of unknown cause   . History of pulmonary embolism   . Hyperlipemia   . Hypertension   . Hypothyroidism   . IBS (irritable bowel syndrome)   . MVP (mitral valve prolapse)    echo per dr Dagmar Hait  . Protein S  deficiency ([place]) 2003   dvt/pulmonary embolus-on coumadin for 6 months then off-Sees [facility]  . PVC (premature ventricular contraction)     Past Surgical History:  Procedure Laterality Date  . CESAREAN SECTION  2006  . CHOLECYSTECTOMY N/A 11/03/2014   Procedure: LAPAROSCOPIC CHOLECYSTECTOMY WITH INTRAOPERATIVE CHOLANGIOGRAM;  Surgeon: Georganna Skeans, MD;  Location: [facility];  Service: General;  Laterality: N/A;  . GYNECOLOGIC CRYOSURGERY    . LAPAROSCOPIC ASSISTED VAGINAL HYSTERECTOMY  03/27/2011   Procedure: LAPAROSCOPIC ASSISTED VAGINAL HYSTERECTOMY;  Surgeon: Cyril Mourning, MD;  Location: [facility];  Service: Gynecology;  Laterality: N/A;  . SALPINGOOPHORECTOMY  03/27/2011   Procedure: SALPINGO OOPHERECTOMY;  Surgeon: Cyril Mourning, MD;  Location: [facility];  Service: Gynecology;  Laterality: Bilateral;  . TONSILLECTOMY  92  . VAGINAL RECONSTRUCTIVE SURGERY  2001    Family History  Problem Relation Age of Onset  . High blood pressure Mother   . Anxiety disorder Mother   . Depression Mother   . OCD Mother   . Physical abuse Mother   . Alcohol abuse Mother   . Hyperlipidemia Father   . High blood pressure Father   . Hypertension Father   . Cancer Father        skin  . ADD / ADHD Son   . Seizures Son   . ADD / ADHD Son   . Hypertension Other  entire family on both sides  . Asthma Son   . Asthma Son   . Clotting disorder Maternal Uncle   . Clotting disorder Paternal Grandmother   . Heart disease Maternal Grandfather   . Alcohol abuse Maternal Grandfather   . Anxiety disorder Maternal Grandmother     No Known Allergies  Outpatient Medications Prior to Visit  Medication Sig Dispense Refill  . ALPRAZolam (XANAX XR) 2 MG 24 hr tablet Take 1 tablet (2 mg total) by mouth daily. For anxiety 5 tablet 0  . ALPRAZolam (XANAX) 0.5 MG tablet Take 1 tablet (0.5 mg total) by mouth 2 (two) times daily as needed for anxiety. 10 tablet 0  . ARIPiprazole (ABILIFY) 5 MG  tablet Take 1 tablet (5 mg total) by mouth daily. 30 tablet 0  . aspirin EC 81 MG tablet Take 81 mg by mouth daily.    . colestipol (COLESTID) 1 g tablet TAKE 2 TABLETS(2 GRAMS) BY MOUTH TWICE DAILY 120 tablet 1  . ergocalciferol (VITAMIN D2) 1.25 MG (50000 UT) capsule Take 50,000 Units by mouth once a week.    . esomeprazole (NEXIUM) 40 MG capsule Take 1 capsule (40 mg total) by mouth daily. For acid reflux 5 capsule 0  . ESTRACE VAGINAL 0.1 MG/GM vaginal cream Place vaginally 2 x a week at bedtime. 42.5 g 1  . ezetimibe (ZETIA) 10 MG tablet Take 1 tablet (10 mg total) by mouth daily. 90 tablet 3  . FLUoxetine (PROZAC) 40 MG capsule Take 1 capsule (40 mg total) by mouth daily. 30 capsule 0  . fluticasone (FLONASE) 50 MCG/ACT nasal spray Place 2 sprays into both nostrils daily. 16 g 6  . gabapentin (NEURONTIN) 100 MG capsule 100 mg tab PO qhs x3 nights, can increase by 1 tab q3 nights until 300 mg. 90 capsule 0  . metoprolol tartrate (LOPRESSOR) 50 MG tablet Take 1 tablet (50 mg total) by mouth 2 (two) times daily. 60 tablet 3  . NONFORMULARY OR COMPOUNDED ITEM Place 1 suppository vaginally 2 (two) times a week.    . NONFORMULARY OR COMPOUNDED ITEM Apply 1 mL topically 2 (two) times daily.    . ondansetron (ZOFRAN) 4 MG tablet Take 1 tablet (4 mg total) by mouth every 8 (eight) hours as needed for nausea or vomiting. 20 tablet 3  . oxyCODONE-acetaminophen (PERCOCET/ROXICET) 5-325 MG tablet Take 1 tablet by mouth every 6 (six) hours as needed for severe pain.    . prazosin (MINIPRESS) 1 MG capsule Take 1 capsule (1 mg total) by mouth at bedtime. 30 capsule 0  . rosuvastatin (CRESTOR) 20 MG tablet Take one tablet by mouth once daily: For high cholesterol 5 tablet 0  . SYNTHROID 125 MCG tablet TAKE 1 TABLET(125 MCG) BY MOUTH DAILY BEFORE BREAKFAST: For thyroid hormone replacement 90 tablet 0  . traZODone (DESYREL) 50 MG tablet Take 1 tablet (50 mg total) by mouth at bedtime as needed for sleep.  (Patient taking differently: Take 100 mg by mouth at bedtime as needed for sleep. ) 30 tablet 0   No facility-administered medications prior to visit.     ROS Review of Systems  Constitutional: Negative for activity change, appetite change and fatigue.  HENT: Negative for congestion, sinus pressure and sore throat.   Eyes: Negative for visual disturbance.  Respiratory: Negative for cough, chest tightness, shortness of breath and wheezing.   Cardiovascular: Negative for chest pain and palpitations.  Gastrointestinal: Negative for abdominal distention, abdominal pain and constipation.  Endocrine: Negative  for polydipsia.  Genitourinary: Negative for dysuria and frequency.  Musculoskeletal: Negative for arthralgias and back pain.  Skin: Negative for rash.  Neurological: Negative for tremors, light-headedness and numbness.  Hematological: Does not bruise/bleed easily.  Psychiatric/Behavioral: Positive for dysphoric mood. Negative for agitation and behavioral problems.    Objective:  BP (!) 140/92   Pulse 68   Temp 98 F (36.7 C) (Oral)   Ht 5\' 9"  (1.753 m)   Wt 220 lb (99.8 kg)   LMP 03/19/2011   SpO2 98%   BMI 32.49 kg/m   BP/Weight 05/24/2019 04/12/2019 [id number]  Systolic BP [id number]  Diastolic BP 92 82 90  Wt. (Lbs) 220 224.6 224.4  BMI 32.49 33.17 33.14      Physical Exam Constitutional:      Appearance: She is well-developed.  Neck:     Vascular: No JVD.  Cardiovascular:     Rate and Rhythm: Normal rate.     Heart sounds: Normal heart sounds. No murmur.  Pulmonary:     Effort: Pulmonary effort is normal.     Breath sounds: Normal breath sounds. No wheezing or rales.  Chest:     Chest wall: No tenderness.  Abdominal:     General: Bowel sounds are normal. There is no distension.     Palpations: Abdomen is soft. There is no mass.     Tenderness: There is no abdominal tenderness.  Musculoskeletal:        General: Normal range of motion.     Right lower  leg: No edema.     Left lower leg: No edema.  Neurological:     Mental Status: She is alert and oriented to person, place, and time.  Psychiatric:     Comments: Dysphoric mood     CMP Latest Ref Rng & Units 02/01/2019 12/14/2018 10/29/2018  Glucose 70 - 99 mg/dL 97 121(H) 123(H)  BUN 6 - 23 mg/dL 7 10 9   Creatinine 0.40 - 1.20 mg/dL 0.83 0.91 0.85  Sodium 135 - 145 mEq/L 140 138 139  Potassium 3.5 - 5.1 mEq/L 4.3 4.1 4.1  Chloride 96 - 112 mEq/L 102 99 103  CO2 19 - 32 mEq/L 26 31 26   Calcium 8.4 - 10.5 mg/dL 10.0 10.1 10.1  Total Protein 6.0 - 8.3 g/dL 6.6 6.5 7.5  Total Bilirubin 0.2 - 1.2 mg/dL 0.6 0.6 1.0  Alkaline Phos 39 - 117 U/L 81 76 63  AST 0 - 37 U/L 43(H) 48(H) 23  ALT 0 - 35 U/L 57(H) 57(H) 21    Lipid Panel     Component Value Date/Time   CHOL 198 12/14/2018 1125   CHOL 238 (H) 06/26/2015 0905   TRIG 243.0 (H) 12/14/2018 1125   HDL 43.20 12/14/2018 1125   HDL 42 06/26/2015 0905   CHOLHDL 5 12/14/2018 1125   VLDL 48.6 (H) 12/14/2018 1125   LDLCALC 171 (H) 06/21/2018 0620   LDLCALC 172 (H) 03/13/2018 1025   LDLDIRECT 124.0 12/14/2018 1125    CBC    Component Value Date/Time   WBC 5.5 12/14/2018 1125   RBC 4.62 12/14/2018 1125   HGB 13.7 12/14/2018 1125   HGB 13.5 06/26/2015 0905   HCT 41.7 12/14/2018 1125   HCT 40.5 06/26/2015 0905   PLT 235.0 12/14/2018 1125   PLT 242 06/26/2015 0905   MCV 90.1 12/14/2018 1125   MCV 89 06/26/2015 0905   MCH 29.7 10/29/2018 1129   MCHC 32.9 12/14/2018 1125   RDW 12.6 12/14/2018  1125   RDW 13.2 06/26/2015 0905   LYMPHSABS 2.0 12/14/2018 1125   LYMPHSABS 2.5 06/26/2015 0905   MONOABS 0.4 12/14/2018 1125   EOSABS 0.5 12/14/2018 1125   EOSABS 0.2 06/26/2015 0905   BASOSABS 0.0 12/14/2018 1125   BASOSABS 0.0 06/26/2015 [id number]    Lab Results  Component Value Date   HGBA1C 5.8 12/14/2018    Assessment & Plan:   1. Prediabetes Stable with A1c of 5.8 - HgB A1c  2. Essential hypertension Slightly elevated  blood pressure- will attribute to caffeine use If still elevated at next visit consider adjusting regimen Counseled on blood pressure goal of less than 130/80, low-sodium, DASH diet, medication compliance, 150 minutes of moderate intensity exercise per week. Discussed medication compliance, adverse effects. - Complete Metabolic Panel with GFR - metoprolol tartrate (LOPRESSOR) 50 MG tablet; Take 1 tablet (50 mg total) by mouth 2 (two) times daily.  Dispense: 180 tablet; Refill: 1  3. Hyperlipidemia LDL goal <100 Normal total cholesterol but elevated triglycerides from last set of labs Counseled on low-cholesterol diet Continue Crestor and Zetia - ezetimibe (ZETIA) 10 MG tablet; Take 1 tablet (10 mg total) by mouth daily.  Dispense: 90 tablet; Refill: 1  4. Hypothyroidism due to acquired atrophy of thyroid Controlled - SYNTHROID 125 MCG tablet; TAKE 1 TABLET(125 MCG) BY MOUTH DAILY BEFORE BREAKFAST: For thyroid hormone replacement  Dispense: 90 tablet; Refill: 1 - T4, free - TSH  5. MDD (major depressive disorder), recurrent severe, without psychosis ([place]) Uncontrolled with ongoing grief causing exacerbation Grief share has been beneficial for her-advised to continue with this She will be working on [employer] against the anniversary of her son's passing Continue Abilify, Prozac Followed by psych  6. PTSD (post-traumatic stress disorder) See #5 above   Return in about 3 months (around 08/22/2019) for chronic medical conditions.    Charlott Rakes, MD, FAAFP. [facility] [place], [place]   05/24/2019, 1:54 PM

## 2019-05-25 ENCOUNTER — Encounter: Payer: Self-pay | Admitting: Family Medicine

## 2019-05-25 LAB — CMP14+EGFR
ALT: 27 IU/L (ref 0–32)
AST: 24 IU/L (ref 0–40)
Albumin/Globulin Ratio: 1.7 (ref 1.2–2.2)
Albumin: 4.2 g/dL (ref 3.8–4.8)
Alkaline Phosphatase: 86 IU/L (ref 39–117)
BUN/Creatinine Ratio: 10 (ref 9–23)
BUN: 9 mg/dL (ref 6–24)
Bilirubin Total: 0.4 mg/dL (ref 0.0–1.2)
CO2: 26 mmol/L (ref 20–29)
Calcium: 9.6 mg/dL (ref 8.7–10.2)
Chloride: 103 mmol/L (ref 96–106)
Creatinine, Ser: 0.92 mg/dL (ref 0.57–1.00)
GFR calc Af Amer: 87 mL/min/{1.73_m2} (ref >59)
GFR calc non Af Amer: 75 mL/min/{1.73_m2} (ref >59)
Globulin, Total: 2.5 g/dL (ref 1.5–4.5)
Glucose: 103 mg/dL — ABNORMAL HIGH (ref 65–99)
Potassium: 4.8 mmol/L (ref 3.5–5.2)
Sodium: 141 mmol/L (ref 134–144)
Total Protein: 6.7 g/dL (ref 6.0–8.5)

## 2019-05-25 LAB — T4, FREE: Free T4: 1.49 ng/dL (ref 0.82–1.77)

## 2019-05-25 LAB — TSH: TSH: 0.407 u[IU]/mL — ABNORMAL LOW (ref 0.450–4.500)

## 2019-05-26 ENCOUNTER — Other Ambulatory Visit: Payer: Self-pay

## 2019-05-26 ENCOUNTER — Ambulatory Visit (INDEPENDENT_AMBULATORY_CARE_PROVIDER_SITE_OTHER): Payer: Medicare HMO | Admitting: Psychiatry

## 2019-05-26 DIAGNOSIS — F4310 Post-traumatic stress disorder, unspecified: Secondary | ICD-10-CM | POA: Diagnosis not present

## 2019-05-26 DIAGNOSIS — F332 Major depressive disorder, recurrent severe without psychotic features: Secondary | ICD-10-CM

## 2019-05-26 DIAGNOSIS — R69 Illness, unspecified: Secondary | ICD-10-CM | POA: Diagnosis not present

## 2019-05-26 NOTE — Progress Notes (Signed)
Virtual Visit via Video Note  I connected with Andrea [name] on 05/26/19 at  2:00 PM EST by a video enabled telemedicine application and verified that I am speaking with the correct person using two identifiers.  Location: Patient: Andrea [name] Provider: Lise Auer, LCSW   I discussed the limitations of evaluation and management by telemedicine and the availability of in person appointments. The patient expressed understanding and agreed to proceed.  History of Present Illness: PTSD and MDD   Observations/Objective: Counselor met with Andrea [name] for individual therapy via Webex. Counselor assessed MH symptoms and progress on treatment plan goals. Andrea [name] presents with severe depression, moderate anxiety and increased PTSD symptoms.  Andrea [name] endorsed intrusive suicidal ideation spanning over the past 1.5 weeks, including impulsive and poor decision making such as attempting to hitchhike multiple times, and envisioning a variety of ways she could lose her life. Andrea [name] denied self-harm behaviors. Andrea [name] shared that she was connected with her peer mentor to develop strategies to coping in a healthier manner and assessed need for hospitalization. Counselor explored and processed triggers which led to increased SI. Counselor assessed for current safety and application of safety plan. Andrea [name] endorsed that she was not currently or in the past 24 hours experiencing SI, she did not have a plan, means or desire to take her life. Counselor processed grief and loss concerns related to her son and his upcoming anniversary of passing. Andrea [name] and Counselor developed a plan for coping with the upcoming date and strategies for maintaining through the year. Andrea [name] continues to have concerns about housing, finances, and conflicts with natural supports. Andrea [name] remains involved in Anger Management, Peer Support, Grief Support Groups/Courses, medication management, therapy and attending meetings with her  faith community, for added coping and support.   Assessment and Plan: Counselor will continue to meet with patient to address treatment plan goals. Patient will continue to follow recommendations of providers and implement skills learned in session.  Follow Up Instructions: Counselor will send information for next session via Webex.     I discussed the assessment and treatment plan with the patient. The patient was provided an opportunity to ask questions and all were answered. The patient agreed with the plan and demonstrated an understanding of the instructions.   The patient was advised to call back or seek an in-person evaluation if the symptoms worsen or if the condition fails to improve as anticipated.  I provided 55 minutes of non-face-to-face time during this encounter.   Lise Auer, LCSW

## 2019-05-27 ENCOUNTER — Encounter (HOSPITAL_COMMUNITY): Payer: Self-pay | Admitting: Psychiatry

## 2019-05-27 ENCOUNTER — Telehealth: Payer: Medicare HMO | Admitting: Obstetrics and Gynecology

## 2019-06-02 ENCOUNTER — Other Ambulatory Visit: Payer: Self-pay

## 2019-06-02 ENCOUNTER — Ambulatory Visit (INDEPENDENT_AMBULATORY_CARE_PROVIDER_SITE_OTHER): Payer: Medicare HMO | Admitting: Psychiatry

## 2019-06-02 DIAGNOSIS — F332 Major depressive disorder, recurrent severe without psychotic features: Secondary | ICD-10-CM

## 2019-06-02 DIAGNOSIS — R69 Illness, unspecified: Secondary | ICD-10-CM | POA: Diagnosis not present

## 2019-06-02 DIAGNOSIS — F4310 Post-traumatic stress disorder, unspecified: Secondary | ICD-10-CM | POA: Diagnosis not present

## 2019-06-02 DIAGNOSIS — R4589 Other symptoms and signs involving emotional state: Secondary | ICD-10-CM

## 2019-06-02 NOTE — Progress Notes (Signed)
Virtual Visit via Video Note  I connected with Andrea [name] on 06/02/19 at  2:00 PM EST by a video enabled telemedicine application and verified that I am speaking with the correct person using two identifiers.  Location: Patient: Andrea [name] Provider: Lise Auer, LCSW   I discussed the limitations of evaluation and management by telemedicine and the availability of in person appointments. The patient expressed understanding and agreed to proceed.  History of Present Illness: MDD, PtSD and Difficulty Coping   Observations/Objective: Counselor met with Andrea [name] for individual therapy via Webex. Counselor assessed MH symptoms and progress on treatment plan goals. Andrea [name] presents with moderate to severe depression and moderate anxiety. Andrea [name] endorsed suicidal ideations without a plan or intent and denied self-harm behaviors. Andrea [name] shared that she has established an intake appointment for EMDR for April 1st. We discussed therapeutic needs between discharge with Counselor and intake. Andrea [name] would like to have a weekly or EOW session with a [facility] Provider in the meantime. Counselor to establish care before going on leave. Counselor assessed daily functioning, with Andrea [name] sharing that she has been able to participate in MH/GL support groups, volunteering, church meetings and following up with providers. Andrea [name] reports poor appetite, isolating, lack of hygiene practices, difficulties falling and staying asleep and intrusive/unwanted content in dreams. Counselor and [name] manager of dreams and recent disturbing flashbacks, with counselor providing psychoeducation on Trauma and the Brain. Counselor and Andrea [name] spent time planning and preparing for the 1 year anniversary of the passing of her son, to reduce need for hospitalization. Counselor explored current grief and loss reactions and identified coping strategies to implement.   Assessment and Plan: Counselor will  continue to meet with patient to address treatment plan goals. Patient will continue to follow recommendations of providers and implement skills learned in session.  Follow Up Instructions: Counselor will send information for next session via Webex.     I discussed the assessment and treatment plan with the patient. The patient was provided an opportunity to ask questions and all were answered. The patient agreed with the plan and demonstrated an understanding of the instructions.   The patient was advised to call back or seek an in-person evaluation if the symptoms worsen or if the condition fails to improve as anticipated.  I provided 55 minutes of non-face-to-face time during this encounter.   Lise Auer, LCSW

## 2019-06-04 ENCOUNTER — Other Ambulatory Visit: Payer: Self-pay | Admitting: Obstetrics and Gynecology

## 2019-06-04 ENCOUNTER — Encounter (HOSPITAL_COMMUNITY): Payer: Self-pay | Admitting: Psychiatry

## 2019-06-04 NOTE — Telephone Encounter (Addendum)
Left message for patient to callback to see if she has enough of her gabapentin to last her until her virtual visit with dr Talbert Nan on 06-11-2019.  Spoke to patient & she is afraid she will run out of medication before her virtual visit. Please approve if appropriate. Medication refill request: gabapentin Last AEX:  04-12-2019 Next AEX: not scheduled Last MMG (if hormonal medication request): none

## 2019-06-07 ENCOUNTER — Other Ambulatory Visit: Payer: Self-pay

## 2019-06-07 MED ORDER — COLESTIPOL HCL 1 G PO TABS: ORAL_TABLET | ORAL | 1 refills | Status: AC

## 2019-06-09 ENCOUNTER — Encounter (HOSPITAL_COMMUNITY): Payer: Self-pay | Admitting: Psychiatry

## 2019-06-09 ENCOUNTER — Other Ambulatory Visit: Payer: Self-pay

## 2019-06-09 ENCOUNTER — Ambulatory Visit (INDEPENDENT_AMBULATORY_CARE_PROVIDER_SITE_OTHER): Payer: Medicare HMO | Admitting: Psychiatry

## 2019-06-09 DIAGNOSIS — F332 Major depressive disorder, recurrent severe without psychotic features: Secondary | ICD-10-CM | POA: Diagnosis not present

## 2019-06-09 DIAGNOSIS — R69 Illness, unspecified: Secondary | ICD-10-CM | POA: Diagnosis not present

## 2019-06-09 DIAGNOSIS — F4310 Post-traumatic stress disorder, unspecified: Secondary | ICD-10-CM | POA: Diagnosis not present

## 2019-06-09 NOTE — Progress Notes (Signed)
Virtual Visit via Video Note  I connected with Andrea [name] on 06/09/19 at  2:00 PM EST by a video enabled telemedicine application and verified that I am speaking with the correct person using two identifiers.  Location: Patient: Andrea [name] Provider: Lise Auer, LCSW   I discussed the limitations of evaluation and management by telemedicine and the availability of in person appointments. The patient expressed understanding and agreed to proceed.  History of Present Illness: MDD and PTSD   Observations/Objective: Counselor met with Andrea [name] for individual therapy via Webex. Counselor assessed MH symptoms and progress on treatment plan goals. Andrea [name] presents with moderate depression and moderate anxiety. Andrea [name]  denied suicidal ideation or self-harm behaviors. Andrea [name] shared that she addressed an issue with a peer directly using assertive communication and establishing boundaries that aligned with her needs and values. Counselor processed thoughts and feelings about the outcomes of her actions, with Andrea [name] identifying contentment and relief. Counselor explored a recent loss and disconnect with a provider with Andrea [name], highlighting progress, growth, emotional response and an action plan for moving forward. Counselor assessed daily functioning with Andrea [name] endorsing improvements in social engagement and personal hygiene. Andrea [name] expressed concern about GI issues, and has an appointment scheduled to address needs. Counselor and Andrea [name] spend remaining time planning and preparing for how she will spend the anniversary of her son's passing in a healthy and supported way. Andrea [name] has identified a variety of activities she would like to complete to memorialize her son. She will work on a letter to him for homework to review at next session. Andrea [name] continues to participate in Grief and Loss support groups, attends church and Psychologist, occupational.   Assessment and Plan: Counselor will continue  to meet with patient to address treatment plan goals. Patient will continue to follow recommendations of providers and implement skills learned in session.  Follow Up Instructions: Counselor will send information for next session via Webex.     I discussed the assessment and treatment plan with the patient. The patient was provided an opportunity to ask questions and all were answered. The patient agreed with the plan and demonstrated an understanding of the instructions.   The patient was advised to call back or seek an in-person evaluation if the symptoms worsen or if the condition fails to improve as anticipated.  I provided 60 minutes of non-face-to-face time during this encounter.   Lise Auer, LCSW

## 2019-06-11 ENCOUNTER — Encounter: Payer: Self-pay | Admitting: Obstetrics and Gynecology

## 2019-06-11 ENCOUNTER — Telehealth (INDEPENDENT_AMBULATORY_CARE_PROVIDER_SITE_OTHER): Payer: Medicare HMO | Admitting: Obstetrics and Gynecology

## 2019-06-11 ENCOUNTER — Other Ambulatory Visit: Payer: Self-pay

## 2019-06-11 ENCOUNTER — Ambulatory Visit: Payer: Medicare HMO | Admitting: Internal Medicine

## 2019-06-11 DIAGNOSIS — N951 Menopausal and female climacteric states: Secondary | ICD-10-CM | POA: Diagnosis not present

## 2019-06-11 DIAGNOSIS — F3289 Other specified depressive episodes: Secondary | ICD-10-CM | POA: Diagnosis not present

## 2019-06-11 DIAGNOSIS — E8940 Asymptomatic postprocedural ovarian failure: Secondary | ICD-10-CM | POA: Diagnosis not present

## 2019-06-11 DIAGNOSIS — F4321 Adjustment disorder with depressed mood: Secondary | ICD-10-CM | POA: Diagnosis not present

## 2019-06-11 DIAGNOSIS — R69 Illness, unspecified: Secondary | ICD-10-CM | POA: Diagnosis not present

## 2019-06-11 MED ORDER — GABAPENTIN 100 MG PO CAPS: ORAL_CAPSULE | ORAL | 0 refills | Status: DC

## 2019-06-11 MED ORDER — GABAPENTIN 300 MG PO CAPS: 300.0000 mg | ORAL_CAPSULE | Freq: Every day | ORAL | 0 refills | Status: DC
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
## 2019-06-14 ENCOUNTER — Other Ambulatory Visit: Payer: Self-pay | Admitting: Obstetrics and Gynecology

## 2019-06-15 NOTE — Telephone Encounter (Signed)
Medication refill request: Estradiol  Last AEX:  04/12/19  Next AEX: Nothing scheduled at this time  Last MMG (if hormonal medication request): 05/27/18 Density B Bi-rads 4 suspicious BX same day Fibrocystic Changes.  Refill authorized: 42.5g with 1 RF

## 2019-06-15 NOTE — Telephone Encounter (Signed)
I called in one tube of estrace in 11/20 with one refill. That should last her almost a year. Please make sure she is only using 1 gram 2 x a week. She shouldn't need a refill yet.

## 2019-06-16 ENCOUNTER — Ambulatory Visit (INDEPENDENT_AMBULATORY_CARE_PROVIDER_SITE_OTHER): Payer: Medicare HMO | Admitting: Psychiatry

## 2019-06-16 ENCOUNTER — Other Ambulatory Visit: Payer: Self-pay

## 2019-06-16 ENCOUNTER — Encounter (HOSPITAL_COMMUNITY): Payer: Self-pay | Admitting: Psychiatry

## 2019-06-16 DIAGNOSIS — R69 Illness, unspecified: Secondary | ICD-10-CM | POA: Diagnosis not present

## 2019-06-16 DIAGNOSIS — F332 Major depressive disorder, recurrent severe without psychotic features: Secondary | ICD-10-CM | POA: Diagnosis not present

## 2019-06-16 DIAGNOSIS — F4310 Post-traumatic stress disorder, unspecified: Secondary | ICD-10-CM | POA: Diagnosis not present

## 2019-06-16 NOTE — Progress Notes (Signed)
Virtual Visit via Video Note  I connected with Andrea [name] on 06/16/19 at  2:00 PM EST by a video enabled telemedicine application and verified that I am speaking with the correct person using two identifiers.  Location: Patient: Patient Home Provider: Home Office   I discussed the limitations of evaluation and management by telemedicine and the availability of in person appointments. The patient expressed understanding and agreed to proceed.  History of Present Illness: MDD and PTSD   Treatment Plan Goals: Andrea [name] would like to find new purpose in life, grieve the loss of her son, decrease depressive symptoms and suicidal ideations.   Observations/Objective: Counselor met with Andrea Malta for individual therapy via Webex. Counselor assessed MH symptoms and progress on treatment plan goals, with patient reporting she has devoted significant time over the past month planning for the anniversary of her son's passing, feeling more capable of sustaining at home without need of hospitalization. Andrea [name] presents with moderate depression and moderate anxiety. Andrea [name] denied suicidal ideation or self-harm behaviors.   Andrea [name] shared about her plans for honoring the memory of her son on the anniversary of his passing. Counselor processed grief responses and praised Andrea [name] for the work she has put into the meaningful sentiments. Counselor and Andrea [name] discussed planned support through friends and family over the weekend, reviewing the crisis/safety plan, if the need arise. Counselor highlighted progress and growth made in goals over the past year. Andrea [name], too, acknowledged areas of growth and identified fulfillment in her efforts to discover her new identity apart from being a mother to her son. Counselor and Andrea Malta discussed discharge plan for the Counselor's leave. Andrea [name] has arranged to meet with a Hospice Counselor weekly during the month of March and will begin EMDR treatment with an  additional therapist in April. Counselor has arranged for an internal therapist to be on standby in case there are issues with access to services. Counselor and Andrea [name] discussed questions and concerns she presented about EMDR/Trauma treatment. Andrea [name] additionally noted that she remains in [employer], Volunteering and with her church, which she finds is a comfort.   Assessment and Plan: Counselor will continue to meet with patient to address treatment plan goals. Patient will continue to follow recommendations of providers and implement skills learned in session.  Follow Up Instructions: Counselor will send information for next session via Webex.    The patient was advised to call back or seek an in-person evaluation if the symptoms worsen or if the condition fails to improve as anticipated.  I provided 55 minutes of non-face-to-face time during this encounter.   Lise Auer, LCSW

## 2019-06-18 ENCOUNTER — Other Ambulatory Visit: Payer: Self-pay | Admitting: Obstetrics and Gynecology

## 2019-06-23 ENCOUNTER — Ambulatory Visit (INDEPENDENT_AMBULATORY_CARE_PROVIDER_SITE_OTHER): Payer: Medicare HMO | Admitting: Psychiatry

## 2019-06-23 ENCOUNTER — Other Ambulatory Visit: Payer: Self-pay

## 2019-06-23 ENCOUNTER — Encounter (HOSPITAL_COMMUNITY): Payer: Self-pay | Admitting: Psychiatry

## 2019-06-23 DIAGNOSIS — F4310 Post-traumatic stress disorder, unspecified: Secondary | ICD-10-CM | POA: Diagnosis not present

## 2019-06-23 DIAGNOSIS — F332 Major depressive disorder, recurrent severe without psychotic features: Secondary | ICD-10-CM | POA: Diagnosis not present

## 2019-06-23 DIAGNOSIS — R69 Illness, unspecified: Secondary | ICD-10-CM | POA: Diagnosis not present

## 2019-06-23 NOTE — Progress Notes (Signed)
Virtual Visit via Video Note  I connected with [name] B. Allan on 06/23/19 at  2:00 PM EST by a video enabled telemedicine application and verified that I am speaking with the correct person using two identifiers.  Location: Patient: Patient Home Provider: Home Office   I discussed the limitations of evaluation and management by telemedicine and the availability of in person appointments. The patient expressed understanding and agreed to proceed.  History of Present Illness: MDD and PTSD   Treatment Plan Goals: Kelcy would like to find new purpose in life, grieve the loss of her son, decrease depressive symptoms and suicidal ideations.  Observations/Objective: Counselor met with [name] for individual therapy via Webex. Counselor assessed MH symptoms and progress on treatment plan goals, with patient reporting increased trauma triggers and a new awareness of her role/purpose in her living son's life. Symiah presents with moderate depression and severe anxiety, with frequent panic attacks over the past 5 days. Ayanna denied suicidal ideation or self-harm behaviors.   Matina shared about her experience in honoring/remembering the one year anniversary of her son's death by suicide. Armentha shared she invited her adult son to participate in the ceremony she planned, and that he attended, along with his girlfriend. Counselor processed grief responses, thoughts, emotions, as Lilyana remained teary, anxious and emotional throughout the session. Cyriah shared that she was very triggered by learning of her adult son's experience on the day of her son's passing. Rayetta identified feelings of anger, confusion, deep sadness and disbelief. Counselor worked to help [employer] and body during session as she began the onset of a panic attack. Vinnie was able to calm herself using coping skills. Counselor praised Engineer, civil (consulting) for her ability to hold the ceremony, be present in the  moment, make meaning of his loss, interact with her adult son, and for her deep concern about how he is grieving and has been impacted. Dereka was experiencing self-doubt, feeling her reality was challenged by his perspective of events and desired questions to be answered about her recollections. Counselor share psychoeducation about the impact of trauma and grief experiences and engaged [place] in "examining the evidence." Sharion was able to calm down by the end of the session and stated that she was emotionally drained and would rest after the session. Shalana noted that she is reengaging with [facility] [employer] [facility] [employer], continues to Psychologist, occupational, attend church and grief and loss support groups.   Assessment and Plan: Counselor will continue to meet with patient to address treatment plan goals. Patient will continue to follow recommendations of providers and implement skills learned in session.  Follow Up Instructions: Counselor will send information for next session via Webex.    The patient was advised to call back or seek an in-person evaluation if the symptoms worsen or if the condition fails to improve as anticipated.  I provided 55 minutes of non-face-to-face time during this encounter.   Lise Auer, LCSW

## 2019-06-24 DIAGNOSIS — M4726 Other spondylosis with radiculopathy, lumbar region: Secondary | ICD-10-CM | POA: Diagnosis not present

## 2019-06-24 DIAGNOSIS — G894 Chronic pain syndrome: Secondary | ICD-10-CM | POA: Diagnosis not present

## 2019-06-24 DIAGNOSIS — Z79891 Long term (current) use of opiate analgesic: Secondary | ICD-10-CM | POA: Diagnosis not present

## 2019-06-24 DIAGNOSIS — M5416 Radiculopathy, lumbar region: Secondary | ICD-10-CM | POA: Diagnosis not present

## 2019-06-28 ENCOUNTER — Encounter: Payer: Self-pay | Admitting: Obstetrics and Gynecology

## 2019-06-28 ENCOUNTER — Telehealth: Payer: Self-pay | Admitting: Obstetrics and Gynecology

## 2019-06-28 DIAGNOSIS — R1310 Dysphagia, unspecified: Secondary | ICD-10-CM | POA: Diagnosis not present

## 2019-06-28 DIAGNOSIS — K625 Hemorrhage of anus and rectum: Secondary | ICD-10-CM | POA: Diagnosis not present

## 2019-06-28 DIAGNOSIS — R1030 Lower abdominal pain, unspecified: Secondary | ICD-10-CM | POA: Diagnosis not present

## 2019-06-28 DIAGNOSIS — K529 Noninfective gastroenteritis and colitis, unspecified: Secondary | ICD-10-CM | POA: Diagnosis not present

## 2019-06-28 NOTE — Telephone Encounter (Signed)
Patient sent the following message through [place]. Routing to triage to assist patient with request.  [facility]keeper [facility]  Phone Number: [phone]  Good morning Dr. Talbert Nan. I hope you are doing well.  I have been trying to fill my Estrace since January 22nd and everytime they fax the office, it's denied.   I know you put it in and dispensed as written for Brand name Estrace on January 22 when we met.  I go to the [facility]- [facility] [address] in [place].   Thank you in advance for your assistance.   Sincerely,   Andrea [name]   Please let me know what to do. I have had any problems getting the Gabapentin.

## 2019-06-28 NOTE — Telephone Encounter (Signed)
Rx for vaginal Estrace cream sent to pharmacy on 04/12/19, 42.5g/1 refill.   [facility] with [facility], was advised patient filled RX on 04/14/19 and again on 05/11/19.   Spoke with patient. Patient has been using vaginal estrace cream 3-4x/week. Patient states she has been placing a "full applicator". Advised patient typically place 1 gram and 1 tube should last approximately 5 months. I will confirm dosage and Rx with Dr. Talbert Nan and return call. Patient agreeable.   Dr. Talbert Nan -please review and advise on estrace vaginal cream Rx.

## 2019-06-28 NOTE — Telephone Encounter (Signed)
She should be using 1 gram 2 x a week at hs. Please have her adjust. Refill the script with the above directions

## 2019-06-29 MED ORDER — ESTRACE 0.1 MG/GM VA CREA: TOPICAL_CREAM | VAGINAL | 0 refills | Status: DC

## 2019-06-29 NOTE — Telephone Encounter (Signed)
Call to patient, left detailed message, ok per dpr. Advised of estrace RX instructions per Dr. Talbert Nan, [facility] has [facility]. Return call to office if any additional questions.   Encounter closed.

## 2019-06-30 ENCOUNTER — Encounter (HOSPITAL_COMMUNITY): Payer: Self-pay | Admitting: Psychiatry

## 2019-06-30 ENCOUNTER — Other Ambulatory Visit: Payer: Self-pay | Admitting: Obstetrics and Gynecology

## 2019-06-30 ENCOUNTER — Other Ambulatory Visit: Payer: Self-pay

## 2019-06-30 ENCOUNTER — Ambulatory Visit (INDEPENDENT_AMBULATORY_CARE_PROVIDER_SITE_OTHER): Payer: Medicare HMO | Admitting: Psychiatry

## 2019-06-30 DIAGNOSIS — F332 Major depressive disorder, recurrent severe without psychotic features: Secondary | ICD-10-CM

## 2019-06-30 DIAGNOSIS — Z1231 Encounter for screening mammogram for malignant neoplasm of breast: Secondary | ICD-10-CM

## 2019-06-30 DIAGNOSIS — R69 Illness, unspecified: Secondary | ICD-10-CM | POA: Diagnosis not present

## 2019-06-30 DIAGNOSIS — F4310 Post-traumatic stress disorder, unspecified: Secondary | ICD-10-CM

## 2019-06-30 NOTE — Progress Notes (Signed)
Virtual Visit via Video Note  I connected with Andrea [name] on 06/30/19 at  2:00 PM EST by a video enabled telemedicine application and verified that I am speaking with the correct person using two identifiers.  Location: Patient: Patient Home Provider: Home Office   I discussed the limitations of evaluation and management by telemedicine and the availability of in person appointments. The patient expressed understanding and agreed to proceed.  History of Present Illness: MDD and PTSD   Treatment Plan Goals: Andrea [name] would like to find new purpose in life, grieve the loss of her son, decrease depressive symptoms and suicidal ideations.  Observations/Objective: Counselor met with Andrea Malta for individual therapy via Webex. Counselor assessed MH symptoms and progress on treatment plan goals, with patient reporting that she experienced increase in depressive symptoms due to trauma triggers resulting in insomnia and dissociative episodes. Andrea [name] presents with moderate depression and severe anxiety. Andrea [name] denied suicidal ideation or self-harm behaviors.  Andrea [name] shared that she was made aware of disturbing information on two accounts since out last session that caused her anxiety to increase and truama responses to be triggered, causing insomnia, ruminations, dissociations, and inability to care for basic needs such as lack of food/fluid intake and neglecting to bathe. Andrea [name] noted that she experienced these symptoms from Thursday to Monday. On Monday she reports contacting her psychiatrist to share symptoms and request a medication to help with sleep. In assessing current functioning Andrea [name] reports that she was able to rest a few hours Monday night and Tuesday night. She stated that she has been able to eat and intake fluids and plans to bath today. Andrea [name] stated that she was not suicidal nor engaged in self-harm, but was concerned that if she wasn't able to get rest that she would need  to be assessed and admitted to inpatient behavioral health. Counselor processed the two triggering events, discussed alternative coping strategies to reduce her anxiety, self-regulate and allow herself to rest to recover from the distressed state she has experienced for days. Andrea [name] made a plan for today to eat a meal, take medications, attend church meeting and rest earlier than normal tonight. Counselor to follow up with Andrea Malta to assess mental health needs/progress before our next session. Andrea [name] stated awareness of how to assess crisis services and/or assessment/admission to inpatient treatment if symptoms worsen.   Assessment and Plan: Counselor will continue to meet with patient to address treatment plan goals. Patient will continue to follow recommendations of providers and implement skills learned in session.  Follow Up Instructions: Counselor will send information for next session via Webex.    The patient was advised to call back or seek an in-person evaluation if the symptoms worsen or if the condition fails to improve as anticipated.  I provided 55 minutes of non-face-to-face time during this encounter.   Lise Auer, LCSW

## 2019-07-07 ENCOUNTER — Ambulatory Visit (HOSPITAL_COMMUNITY): Payer: Medicare HMO | Admitting: Psychiatry

## 2019-07-09 DIAGNOSIS — Z79891 Long term (current) use of opiate analgesic: Secondary | ICD-10-CM | POA: Diagnosis not present

## 2019-07-12 ENCOUNTER — Encounter: Payer: Medicare HMO | Admitting: Family Medicine

## 2019-07-14 ENCOUNTER — Ambulatory Visit (INDEPENDENT_AMBULATORY_CARE_PROVIDER_SITE_OTHER): Payer: Medicare HMO | Admitting: Psychiatry

## 2019-07-14 ENCOUNTER — Other Ambulatory Visit: Payer: Self-pay

## 2019-07-14 ENCOUNTER — Encounter (HOSPITAL_COMMUNITY): Payer: Self-pay | Admitting: Psychiatry

## 2019-07-14 DIAGNOSIS — R4589 Other symptoms and signs involving emotional state: Secondary | ICD-10-CM

## 2019-07-14 DIAGNOSIS — F4310 Post-traumatic stress disorder, unspecified: Secondary | ICD-10-CM | POA: Diagnosis not present

## 2019-07-14 DIAGNOSIS — F332 Major depressive disorder, recurrent severe without psychotic features: Secondary | ICD-10-CM

## 2019-07-14 DIAGNOSIS — R69 Illness, unspecified: Secondary | ICD-10-CM | POA: Diagnosis not present

## 2019-07-14 NOTE — Progress Notes (Signed)
Virtual Visit via Video Note  I connected with Andrea [name] B. Geck on 07/14/19 at  2:00 PM EST by a video enabled telemedicine application and verified that I am speaking with the correct person using two identifiers.  Location: Patient: Patient Home Provider: Home Office   I discussed the limitations of evaluation and management by telemedicine and the availability of in person appointments. The patient expressed understanding and agreed to proceed.  History of Present Illness: MDD and PTSD  Treatment Plan Goals: Andrea [name] would like to find new purpose in life, grieve the loss of her son, decrease depressive symptoms and suicidal ideations.  Observations/Objective: Counselor met with Andrea [name] for individual therapy via Webex. Counselor assessed MH symptoms and progress on treatment plan goals, with patient reporting that she continues to have increased depression and trauma triggers and responses. Julieana presents with moderate depression and severe anxiety. Yena denied suicidal ideation or self-harm behaviors.  Latanja shared updates on her housing situation and the impact her strained relationship with father/housemate has been negatively impacting her mental health. Counselor assessed the situation and dynamics, processing thoughts, feelings, needs and safety. Elen expressed significant grief around new thoughts about the loss of her son, rewinding back to the questioning and anger stages of grief. Maisee was teary and emotional as she processed. She expressed a loss of purpose and identity, feeling hopeless about being able to be happy or care for others again. Counselor used CBT interventions to challenge black and white thinking and to explore her strengths and potential outlets for finding new purpose in life. Poppy was receptive to interventions.   Counselor and Andrea [name] discussed discharge planning, as today is our last planned session before Counselor goes on temporary  leave. Asmara is set up to start EMDR treatment on April 1st with an outside provider. She will meet for 2 follow up appointments with Jackey Loge, [facility] Counselor in March for continuity of care. She understands how to implement her safety plan. Counselor to send additional information about accessing [facility] and local services while Counselor is on Leave.   Assessment and Plan: New Counselor will continue to meet with patient to address treatment plan goals. Patient will continue to follow recommendations of providers and implement skills learned in session.  Follow Up Instructions: New Counselor will send information for next session via Webex.    The patient was advised to call back or seek an in-person evaluation if the symptoms worsen or if the condition fails to improve as anticipated.  I provided 55 minutes of non-face-to-face time during this encounter.   Lise Auer, LCSW

## 2019-07-20 ENCOUNTER — Telehealth (HOSPITAL_COMMUNITY): Payer: Self-pay | Admitting: Licensed Clinical Social Worker

## 2019-07-20 ENCOUNTER — Ambulatory Visit (HOSPITAL_COMMUNITY): Payer: Medicare HMO | Admitting: Licensed Clinical Social Worker

## 2019-07-20 ENCOUNTER — Other Ambulatory Visit: Payer: Self-pay

## 2019-07-20 NOTE — Telephone Encounter (Signed)
Cln called to check-in and follow-up when pt did not present to scheduled virtual therapy appointment. Cln called at 2:05 for a 2:00 appt to problem solve in case there was a technical issue preventing pt from attending session. There was no answer and cln did not leave message as voicemail attached to number was a female's voice with no identifying information. Cln remained on virtual call for 25 minutes and pt did not present.

## 2019-07-21 ENCOUNTER — Telehealth (HOSPITAL_COMMUNITY): Payer: Self-pay | Admitting: Licensed Clinical Social Worker

## 2019-07-22 ENCOUNTER — Telehealth (HOSPITAL_COMMUNITY): Payer: Self-pay | Admitting: Licensed Clinical Social Worker

## 2019-07-22 NOTE — Telephone Encounter (Signed)
Pt returned cln's message and left voicemail. Pt states on voicemail that she is aware she was scheduled for appointments with this cln while her therapist B. Morris is on leave. Pt states she was able to get in with her trauma therapist earlier and cannot see 2 therapists at once. Pt states she will not need appointments that were scheduled with this cln. Pt states she is safe and denies SI. Pt states she will not answer future calls from this cln as pt will not answer calls from private numbers.  Cln will cancel future appointments scheduled.

## 2019-07-27 ENCOUNTER — Encounter: Payer: Self-pay | Admitting: Family Medicine

## 2019-07-27 ENCOUNTER — Ambulatory Visit: Payer: Medicare HMO | Attending: Family Medicine | Admitting: Family Medicine

## 2019-07-27 ENCOUNTER — Other Ambulatory Visit: Payer: Self-pay

## 2019-07-27 VITALS — BP 119/85 | HR 81 | Ht 69.0 in | Wt 221.0 lb

## 2019-07-27 DIAGNOSIS — E034 Atrophy of thyroid (acquired): Secondary | ICD-10-CM | POA: Diagnosis not present

## 2019-07-27 DIAGNOSIS — Z1211 Encounter for screening for malignant neoplasm of colon: Secondary | ICD-10-CM | POA: Diagnosis not present

## 2019-07-27 DIAGNOSIS — J029 Acute pharyngitis, unspecified: Secondary | ICD-10-CM | POA: Diagnosis not present

## 2019-07-27 DIAGNOSIS — B001 Herpesviral vesicular dermatitis: Secondary | ICD-10-CM

## 2019-07-27 DIAGNOSIS — Z90710 Acquired absence of both cervix and uterus: Secondary | ICD-10-CM | POA: Diagnosis not present

## 2019-07-27 DIAGNOSIS — Z20828 Contact with and (suspected) exposure to other viral communicable diseases: Secondary | ICD-10-CM | POA: Diagnosis not present

## 2019-07-27 MED ORDER — CETIRIZINE HCL 10 MG PO TABS: 10.0000 mg | ORAL_TABLET | Freq: Every day | ORAL | 1 refills | Status: DC

## 2019-07-27 MED ORDER — VALACYCLOVIR HCL 1 G PO TABS: 1000.0000 mg | ORAL_TABLET | Freq: Two times a day (BID) | ORAL | 0 refills | Status: DC

## 2019-07-27 NOTE — Progress Notes (Signed)
Subjective:  Patient ID: Andrea [name], female    DOB: June 15, 1975  Age: 44 y.o. MRN: [id number]  CC: Hypertension   HPI Andrea [name] is a 44 year old female with a history of hypertension, hyperlipidemia, Anxiety and Depression, PTSD who presents today with the following complaints.  She complains of a sore throat for a couple of days with associated nasal drip. She stays at home and does not mingle with people No sick contacts, fever. She has had sinus headaches but no facial pressure.  She is wondering if she has strep throat. She had a COVID test as [facility] today which is yet to be resulted.  She breaks out in cold sores when she is under stress and is requesting a refill of Valtrex; she received this from her previous PCP for recurrent herpes labialis in the past. Topical abreva does not work. She is good on all her current medications.  Last thyroid labs revealed suppressed TSH and she has been compliant with her levothyroxine.  Past Medical History:  Diagnosis Date  . Anxiety   . Arthritis    ruptured lumbar disc-careful with positioning  . Blood dyscrasia    protein s deficiency-no treatment since 2006  . Depression   . Diabetes mellitus without complication ([place])   . GERD (gastroesophageal reflux disease)   . Headache(784.0)   . Hemorrhoids   . High cholesterol   . History of fainting spells of unknown cause   . History of pulmonary embolism   . Hyperlipemia   . Hypertension   . Hypothyroidism   . IBS (irritable bowel syndrome)   . MVP (mitral valve prolapse)    echo per dr Dagmar Hait  . Protein S deficiency ([place]) 2003   dvt/pulmonary embolus-on coumadin for 6 months then off-Sees [facility]  . PVC (premature ventricular contraction)     Past Surgical History:  Procedure Laterality Date  . CESAREAN SECTION  2006  . CHOLECYSTECTOMY N/A 11/03/2014   Procedure: LAPAROSCOPIC CHOLECYSTECTOMY WITH INTRAOPERATIVE CHOLANGIOGRAM;  Surgeon: Georganna Skeans, MD;   Location: [place];  Service: General;  Laterality: N/A;  . GYNECOLOGIC CRYOSURGERY    . LAPAROSCOPIC ASSISTED VAGINAL HYSTERECTOMY  03/27/2011   Procedure: LAPAROSCOPIC ASSISTED VAGINAL HYSTERECTOMY;  Surgeon: Cyril Mourning, MD;  Location: [facility];  Service: Gynecology;  Laterality: N/A;  . SALPINGOOPHORECTOMY  03/27/2011   Procedure: SALPINGO OOPHERECTOMY;  Surgeon: Cyril Mourning, MD;  Location: [facility];  Service: Gynecology;  Laterality: Bilateral;  . TONSILLECTOMY  92  . VAGINAL RECONSTRUCTIVE SURGERY  2001    Family History  Problem Relation Age of Onset  . High blood pressure Mother   . Anxiety disorder Mother   . Depression Mother   . OCD Mother   . Physical abuse Mother   . Alcohol abuse Mother   . Hyperlipidemia Father   . High blood pressure Father   . Hypertension Father   . Cancer Father        skin  . ADD / ADHD Son   . Seizures Son   . ADD / ADHD Son   . Hypertension Other        entire family on both sides  . Asthma Son   . Asthma Son   . Clotting disorder Maternal Uncle   . Clotting disorder Paternal Grandmother   . Heart disease Maternal Grandfather   . Alcohol abuse Maternal Grandfather   . Anxiety disorder Maternal Grandmother     No Known Allergies  Outpatient Medications Prior  to Visit  Medication Sig Dispense Refill  . ALPRAZolam (XANAX XR) 2 MG 24 hr tablet Take 1 tablet (2 mg total) by mouth daily. For anxiety 5 tablet 0  . ALPRAZolam (XANAX) 0.5 MG tablet Take 1 tablet (0.5 mg total) by mouth 2 (two) times daily as needed for anxiety. 10 tablet 0  . ARIPiprazole (ABILIFY) 5 MG tablet Take 1 tablet (5 mg total) by mouth daily. 30 tablet 0  . aspirin EC 81 MG tablet Take 81 mg by mouth daily.    . colestipol (COLESTID) 1 g tablet Take two tablets by mouth twice a day 120 tablet 1  . ergocalciferol (VITAMIN D2) 1.25 MG (50000 UT) capsule Take 50,000 Units by mouth once a week.    . esomeprazole (NEXIUM) 40 MG capsule Take 1 capsule (40 mg total)  by mouth daily. For acid reflux 5 capsule 0  . ESTRACE VAGINAL 0.1 MG/GM vaginal cream Place 1 gram vaginally 2 x a week at bedtime. 42.5 g 0  . ezetimibe (ZETIA) 10 MG tablet Take 1 tablet (10 mg total) by mouth daily. 90 tablet 1  . FLUoxetine (PROZAC) 40 MG capsule Take 1 capsule (40 mg total) by mouth daily. 30 capsule 0  . fluticasone (FLONASE) 50 MCG/ACT nasal spray Place 2 sprays into both nostrils daily. 16 g 6  . gabapentin (NEURONTIN) 100 MG capsule TAKE 1 CAPSULE AT BEDTIME with the 300 mg tablet of gabapentin. If needed you can take one tablet in the morning as well. 90 capsule 0  . gabapentin (NEURONTIN) 300 MG capsule Take 1 capsule (300 mg total) by mouth at bedtime. 90 capsule 0  . metoprolol tartrate (LOPRESSOR) 50 MG tablet Take 1 tablet (50 mg total) by mouth 2 (two) times daily. 180 tablet 1  . NONFORMULARY OR COMPOUNDED ITEM Place 1 suppository vaginally 2 (two) times a week.    . NONFORMULARY OR COMPOUNDED ITEM Apply 1 mL topically 2 (two) times daily.    . ondansetron (ZOFRAN) 4 MG tablet Take 1 tablet (4 mg total) by mouth every 8 (eight) hours as needed for nausea or vomiting. 20 tablet 3  . oxyCODONE-acetaminophen (PERCOCET/ROXICET) 5-325 MG tablet Take 1 tablet by mouth every 6 (six) hours as needed for severe pain.    . prazosin (MINIPRESS) 1 MG capsule Take 1 capsule (1 mg total) by mouth at bedtime. 30 capsule 0  . rosuvastatin (CRESTOR) 20 MG tablet Take one tablet by mouth once daily: For high cholesterol 90 tablet 1  . SYNTHROID 125 MCG tablet TAKE 1 TABLET(125 MCG) BY MOUTH DAILY BEFORE BREAKFAST: For thyroid hormone replacement 90 tablet 1  . traZODone (DESYREL) 50 MG tablet Take 1 tablet (50 mg total) by mouth at bedtime as needed for sleep. (Patient taking differently: Take 100 mg by mouth at bedtime as needed for sleep. ) 30 tablet 0   No facility-administered medications prior to visit.     ROS Review of Systems  Constitutional: Negative for activity  change, appetite change and fatigue.  HENT: Positive for mouth sores and sore throat. Negative for congestion and sinus pressure.   Eyes: Negative for visual disturbance.  Respiratory: Negative for cough, chest tightness, shortness of breath and wheezing.   Cardiovascular: Negative for chest pain and palpitations.  Gastrointestinal: Negative for abdominal distention, abdominal pain and constipation.  Endocrine: Negative for polydipsia.  Genitourinary: Negative for dysuria and frequency.  Musculoskeletal: Negative for arthralgias and back pain.  Skin: Negative for rash.  Neurological: Negative for  tremors, light-headedness and numbness.  Hematological: Does not bruise/bleed easily.  Psychiatric/Behavioral: Negative for agitation and behavioral problems.    Objective:  BP 119/85   Pulse 81   Ht 5\' 9"  (1.753 m)   Wt 221 lb (100.2 kg)   LMP 03/19/2011   SpO2 99%   BMI 32.64 kg/m   BP/Weight 07/27/2019 05/24/2019 [id number]  Systolic BP [id number] [id number]  Diastolic BP 85 92 82  Wt. (Lbs) 221 220 224.6  BMI 32.64 32.49 33.17  Some encounter information is confidential and restricted. Go to Review Flowsheets activity to see all data.      Physical Exam Constitutional:      Appearance: She is well-developed.  HENT:     Mouth/Throat:     Mouth: Mucous membranes are moist.     Pharynx: No oropharyngeal exudate or posterior oropharyngeal erythema.  Neck:     Vascular: No JVD.  Cardiovascular:     Rate and Rhythm: Normal rate.     Heart sounds: Normal heart sounds. No murmur.  Pulmonary:     Effort: Pulmonary effort is normal.     Breath sounds: Normal breath sounds. No wheezing or rales.  Chest:     Chest wall: No tenderness.  Abdominal:     General: Bowel sounds are normal. There is no distension.     Palpations: Abdomen is soft. There is no mass.     Tenderness: There is no abdominal tenderness.  Musculoskeletal:        General: Normal range of motion.     Right lower leg: No  edema.     Left lower leg: No edema.  Lymphadenopathy:     Cervical: No cervical adenopathy.  Neurological:     Mental Status: She is alert and oriented to person, place, and time.  Psychiatric:        Mood and Affect: Mood normal.     CMP Latest Ref Rng & Units 05/24/2019 02/01/2019 12/14/2018  Glucose 65 - 99 mg/dL 103(H) 97 121(H)  BUN 6 - 24 mg/dL 9 7 10   Creatinine 0.57 - 1.00 mg/dL 0.92 0.83 0.91  Sodium 134 - 144 mmol/L 141 140 138  Potassium 3.5 - 5.2 mmol/L 4.8 4.3 4.1  Chloride 96 - 106 mmol/L 103 102 99  CO2 20 - 29 mmol/L 26 26 31   Calcium 8.7 - 10.2 mg/dL 9.6 10.0 10.1  Total Protein 6.0 - 8.5 g/dL 6.7 6.6 6.5  Total Bilirubin 0.0 - 1.2 mg/dL 0.4 0.6 0.6  Alkaline Phos 39 - 117 IU/L 86 81 76  AST 0 - 40 IU/L 24 43(H) 48(H)  ALT 0 - 32 IU/L 27 57(H) 57(H)    Lipid Panel     Component Value Date/Time   CHOL 198 12/14/2018 1125   CHOL 238 (H) 06/26/2015 0905   TRIG 243.0 (H) 12/14/2018 1125   HDL 43.20 12/14/2018 1125   HDL 42 06/26/2015 0905   CHOLHDL 5 12/14/2018 1125   VLDL 48.6 (H) 12/14/2018 1125   LDLCALC 171 (H) 06/21/2018 0620   LDLCALC 172 (H) 03/13/2018 1025   LDLDIRECT 124.0 12/14/2018 1125    CBC    Component Value Date/Time   WBC 5.5 12/14/2018 1125   RBC 4.62 12/14/2018 1125   HGB 13.7 12/14/2018 1125   HGB 13.5 06/26/2015 0905   HCT 41.7 12/14/2018 1125   HCT 40.5 06/26/2015 0905   PLT 235.0 12/14/2018 1125   PLT 242 06/26/2015 0905   MCV 90.1 12/14/2018 1125  MCV 89 06/26/2015 0905   MCH 29.7 10/29/2018 1129   MCHC 32.9 12/14/2018 1125   RDW 12.6 12/14/2018 1125   RDW 13.2 06/26/2015 0905   LYMPHSABS 2.0 12/14/2018 1125   LYMPHSABS 2.5 06/26/2015 0905   MONOABS 0.4 12/14/2018 1125   EOSABS 0.5 12/14/2018 1125   EOSABS 0.2 06/26/2015 0905   BASOSABS 0.0 12/14/2018 1125   BASOSABS 0.0 06/26/2015 [id number]    Lab Results  Component Value Date   HGBA1C 5.5 05/24/2019    Assessment & Plan:  1. Recurrent herpes labialis Topical  cream ineffective We will prescribe Valtrex as per request - valACYclovir (VALTREX) 1000 MG tablet; Take 1 tablet (1,000 mg total) by mouth 2 (two) times daily.  Dispense: 20 tablet; Refill: 0  2. Hypothyroidism due to acquired atrophy of thyroid Suppressed TSH from last visit We will send of thyroid panel and adjust regimen accordingly - T4, free - TSH  3. Sorethroat Likely of sinus origin Placed on antihistamine, advised to use Flonase - cetirizine (ZYRTEC) 10 MG tablet; Take 1 tablet (10 mg total) by mouth daily.  Dispense: 30 tablet; Refill: 1   Follow-up: Return in about 6 months (around 01/27/2020) for Medical conditions.       Charlott Rakes, MD, FAAFP. [facility], [place]   07/27/2019, 3:45 PM

## 2019-07-27 NOTE — Progress Notes (Signed)
Having a sore throat denies any other covid symptoms.

## 2019-07-27 NOTE — Patient Instructions (Signed)
Cold Sore  A cold sore, also called a fever blister, is a small, fluid-filled sore that forms inside of the mouth or on the lips, gums, nose, chin, or cheeks. Cold sores can spread to other parts of the body, such as the eyes or fingers. Cold sores can spread from person to person (are contagious) until the sores crust over completely. Most cold sores go away within 2 weeks. What are the causes? Cold sores are caused by a virus (herpes simplex virus type 1, HSV-1). The virus can spread from person to person through close contact, such as through:  Kissing.  Touching the affected area.  Sharing personal items such as lip balm, razors, a drinking glass, or eating utensils. What increases the risk? You are more likely to develop this condition if you:  Are tired, stressed, or sick.  Are having your period (menstruating).  Are pregnant.  Take certain medicines.  Are out in cold weather or get too much sun. What are the signs or symptoms? Symptoms of a cold sore outbreak go through different stages. These are the stages of a cold sore:  Tingling, itching, or burning is felt 1-2 days before the outbreak.  Fluid-filled blisters appear on the lips, inside the mouth, on the nose, or on the cheeks.  The blisters start to ooze clear fluid.  The blisters dry up, and a yellow crust appears in their place.  The crust falls off. In some cases, other symptoms can develop during a cold sore outbreak. These can include:  Fever.  Sore throat.  Headache.  Muscle aches.  Swollen neck glands. How is this treated? There is no cure for cold sores or the virus that causes them. There is also no vaccine to prevent the virus. Most cold sores go away on their own without treatment within 2 weeks. Your doctor may prescribe medicines to:  Help with pain.  Keep the virus from growing.  Help you heal faster. Medicines may be in the form of creams, gels, pills, or a shot. Follow these  instructions at home: Medicines  Take or apply over-the-counter and prescription medicines only as told by your doctor.  Use a cotton-tip swab to apply creams or gels to your sores.  Ask your doctor if you can take lysine supplements. These may help with healing. Sore care   Do not touch the sores or pick the scabs.  Wash your hands often. Do not touch your eyes without washing your hands first.  Keep the sores clean and dry.  If told, put ice on the sores: ? Put ice in a plastic bag. ? Place a towel between your skin and the bag. ? Leave the ice on for 20 minutes, 2-3 times a day. Eating and drinking  Eat a soft, bland diet. Avoid eating hot, cold, or salty foods. These can hurt your mouth.  Use a straw if it hurts to drink out of a glass.  Eat foods that have a lot of lysine in them. These include meat, fish, and dairy products.  Avoid sugary foods, chocolates, nuts, and grains. These foods have a high amount of a substance (arginine) that can cause the virus to grow. Lifestyle  Do not kiss, have oral sex, or share personal items until your sores heal.  Stress, poor sleep, and being out in the sun can trigger outbreaks. Make sure you: ? Do activities that help you relax, such as deep breathing exercises or meditation. ? Get enough sleep. ? Apply sunscreen   on your lips before you go out in the sun. Contact a doctor if:  You have symptoms for more than 2 weeks.  You have pus coming from the sores.  You have redness that is spreading.  You have pain or irritation in your eye.  You get sores on your genitals.  Your sores do not heal within 2 weeks.  You get cold sores often. Get help right away if:  You have a fever and your symptoms suddenly get worse.  You have a headache and confusion.  You have tiredness (fatigue).  You do not want to eat as much as normal (loss of appetite).  You have a stiff neck or are sensitive to light. Summary  A cold sore is  a small, fluid-filled sore that forms inside of the mouth or on the lips, gums, nose, chin, or cheeks.  Cold sores can spread from person to person (are contagious) until the sores crust over completely. Most cold sores go away within 2 weeks.  Wash your hands often. Do not touch your eyes without washing your hands first.  Do not kiss, have oral sex, or share personal items until your sores heal.  Contact a doctor if your sores do not heal within 2 weeks. This information is not intended to replace advice given to you by your health care provider. Make sure you discuss any questions you have with your health care provider. Document Revised: 08/26/2018 Document Reviewed: 10/06/2017 Elsevier Patient Education  2020 Elsevier Inc.  

## 2019-07-28 ENCOUNTER — Encounter: Payer: Self-pay | Admitting: Family Medicine

## 2019-07-28 ENCOUNTER — Other Ambulatory Visit: Payer: Self-pay | Admitting: Family Medicine

## 2019-07-28 DIAGNOSIS — E034 Atrophy of thyroid (acquired): Secondary | ICD-10-CM

## 2019-07-28 LAB — TSH: TSH: 0.18 u[IU]/mL — ABNORMAL LOW (ref 0.450–4.500)

## 2019-07-28 LAB — T4, FREE: Free T4: 1.53 ng/dL (ref 0.82–1.77)

## 2019-07-28 MED ORDER — LEVOTHYROXINE SODIUM 112 MCG PO TABS: 112.0000 ug | ORAL_TABLET | Freq: Every day | ORAL | 3 refills | Status: DC

## 2019-07-28 MED ORDER — SYNTHROID 112 MCG PO TABS: 112.0000 ug | ORAL_TABLET | Freq: Every day | ORAL | 1 refills | Status: DC

## 2019-07-29 ENCOUNTER — Encounter: Payer: Self-pay | Admitting: Family Medicine

## 2019-07-31 ENCOUNTER — Other Ambulatory Visit: Payer: Self-pay | Admitting: Family Medicine

## 2019-07-31 DIAGNOSIS — J029 Acute pharyngitis, unspecified: Secondary | ICD-10-CM

## 2019-08-02 NOTE — Telephone Encounter (Signed)
Patient is requesting refill with change. Please advise patient on request.

## 2019-08-03 ENCOUNTER — Ambulatory Visit (HOSPITAL_COMMUNITY): Payer: Medicare HMO | Admitting: Licensed Clinical Social Worker

## 2019-08-06 ENCOUNTER — Telehealth: Payer: Self-pay | Admitting: Obstetrics and Gynecology

## 2019-08-06 NOTE — Telephone Encounter (Signed)
Representative from [employer] called to speak with nurse about an alternative to the estrace cream which is not covered. They recommend estradiol. Phone number is [phone].

## 2019-08-09 NOTE — Telephone Encounter (Signed)
Spoke with Animator at [employer].  Estrace vaginal cream no longer covered under plan. Requires PA.  PA started in [email or web address] Key: [id number]  Covered alternatives: estradiol vaginal cream, estradiol vaginal tab, Yuvafem, Premarin vaginal cream, Estring.

## 2019-08-09 NOTE — Telephone Encounter (Signed)
Left message to call Milferd Ansell, RN at [facility] [phone].   

## 2019-08-10 ENCOUNTER — Ambulatory Visit: Payer: Medicare HMO

## 2019-08-11 NOTE — Telephone Encounter (Signed)
Spoke with patient. Advised as seen below, patient request PA be submitted to plan. Has tried estradiol vaginal cream, not effective, experienced vaginal burning and redness. Advised patient I will submit PA, will advise of response once received. Patient agreeable.    PA submitted to plan for Estrace Vaginal Cream, via [email or web address] Key: [id number] - PA Case ID: [id number]

## 2019-08-11 NOTE — Telephone Encounter (Signed)
Left message to call Andrea Strehle, RN at [facility] [phone].   

## 2019-08-12 NOTE — Telephone Encounter (Signed)
PA denied for Estrace vaginal cream.   Appeal submitted via [email or web address]

## 2019-08-12 NOTE — Telephone Encounter (Signed)
Appeal approved for estrace vaginal cream, valid 05/21/19 -05/19/20.   Patient notified. Patient thankful for f/u.   Routing to provider for final review. Patient is agreeable to disposition. Will close encounter.

## 2019-08-16 ENCOUNTER — Other Ambulatory Visit: Payer: Self-pay

## 2019-08-16 ENCOUNTER — Ambulatory Visit
Admission: RE | Admit: 2019-08-16 | Discharge: 2019-08-16 | Disposition: A | Discharge status: Home / Self Care | Payer: Medicare HMO | Source: Ambulatory Visit | Attending: Obstetrics and Gynecology | Admitting: Obstetrics and Gynecology

## 2019-08-16 DIAGNOSIS — Z1231 Encounter for screening mammogram for malignant neoplasm of breast: Secondary | ICD-10-CM | POA: Diagnosis not present

## 2019-08-18 DIAGNOSIS — Z79891 Long term (current) use of opiate analgesic: Secondary | ICD-10-CM | POA: Diagnosis not present

## 2019-08-18 DIAGNOSIS — G894 Chronic pain syndrome: Secondary | ICD-10-CM | POA: Diagnosis not present

## 2019-08-18 DIAGNOSIS — M5416 Radiculopathy, lumbar region: Secondary | ICD-10-CM | POA: Diagnosis not present

## 2019-08-18 DIAGNOSIS — M4726 Other spondylosis with radiculopathy, lumbar region: Secondary | ICD-10-CM | POA: Diagnosis not present

## 2019-08-19 DIAGNOSIS — R69 Illness, unspecified: Secondary | ICD-10-CM | POA: Diagnosis not present

## 2019-08-23 ENCOUNTER — Ambulatory Visit: Payer: Medicare HMO | Admitting: Family Medicine

## 2019-08-27 DIAGNOSIS — R69 Illness, unspecified: Secondary | ICD-10-CM | POA: Diagnosis not present

## 2019-08-31 ENCOUNTER — Other Ambulatory Visit: Payer: Self-pay | Admitting: Obstetrics and Gynecology

## 2019-08-31 NOTE — Telephone Encounter (Signed)
Medication refill request: Estrace 42.5 g  Last AEX: 04/12/2019 with JJ  Next AEX: Not scheduled  Last MMG (if hormonal medication request): 08/16/2019 BI-RADS 1: Neg  Refill authorized: 42.5 g, 2 RF pended.   Please advice and refill if appropriate.

## 2019-08-31 NOTE — Telephone Encounter (Signed)
I called in 42.5 grams of estrace cream for her on 06/29/19. She should be using 1 gram 2 x a week. In 2 months she shouldn't be done with half the tube. Please call and check with the patient. I don't want her to be using too much of the cream. If she is out of the estrace, then send one refill with caution about dosing.

## 2019-09-01 NOTE — Telephone Encounter (Signed)
Left message to call Wilhelmenia Addis, RN at [facility] [phone].   

## 2019-09-12 ENCOUNTER — Other Ambulatory Visit: Payer: Self-pay | Admitting: Family Medicine

## 2019-09-12 DIAGNOSIS — R110 Nausea: Secondary | ICD-10-CM

## 2019-09-16 DIAGNOSIS — R69 Illness, unspecified: Secondary | ICD-10-CM | POA: Diagnosis not present

## 2019-09-26 ENCOUNTER — Other Ambulatory Visit: Payer: Self-pay | Admitting: Family Medicine

## 2019-09-26 DIAGNOSIS — B001 Herpesviral vesicular dermatitis: Secondary | ICD-10-CM

## 2019-09-28 ENCOUNTER — Other Ambulatory Visit: Payer: Self-pay | Admitting: Family Medicine

## 2019-09-28 DIAGNOSIS — R110 Nausea: Secondary | ICD-10-CM

## 2019-09-29 DIAGNOSIS — Z1159 Encounter for screening for other viral diseases: Secondary | ICD-10-CM | POA: Diagnosis not present

## 2019-09-29 NOTE — Telephone Encounter (Signed)
Call to patient, left detailed message, ok per dpr. Advised our office received a refill request for vaginal estrogen cream, please return call to office to advise if refill needed.

## 2019-09-30 ENCOUNTER — Other Ambulatory Visit: Payer: Self-pay | Admitting: Family Medicine

## 2019-09-30 ENCOUNTER — Encounter: Payer: Self-pay | Admitting: Family Medicine

## 2019-09-30 ENCOUNTER — Other Ambulatory Visit: Payer: Self-pay

## 2019-09-30 DIAGNOSIS — R69 Illness, unspecified: Secondary | ICD-10-CM | POA: Diagnosis not present

## 2019-09-30 DIAGNOSIS — R110 Nausea: Secondary | ICD-10-CM

## 2019-09-30 MED ORDER — ONDANSETRON HCL 4 MG PO TABS: 4.0000 mg | ORAL_TABLET | Freq: Three times a day (TID) | ORAL | 1 refills | Status: DC | PRN

## 2019-10-01 ENCOUNTER — Encounter: Payer: Self-pay | Admitting: Obstetrics and Gynecology

## 2019-10-03 ENCOUNTER — Other Ambulatory Visit: Payer: Self-pay | Admitting: Obstetrics and Gynecology

## 2019-10-04 DIAGNOSIS — K228 Other specified diseases of esophagus: Secondary | ICD-10-CM | POA: Diagnosis not present

## 2019-10-04 DIAGNOSIS — K648 Other hemorrhoids: Secondary | ICD-10-CM | POA: Diagnosis not present

## 2019-10-04 DIAGNOSIS — K2980 Duodenitis without bleeding: Secondary | ICD-10-CM | POA: Diagnosis not present

## 2019-10-04 DIAGNOSIS — K625 Hemorrhage of anus and rectum: Secondary | ICD-10-CM | POA: Diagnosis not present

## 2019-10-04 DIAGNOSIS — R1310 Dysphagia, unspecified: Secondary | ICD-10-CM | POA: Diagnosis not present

## 2019-10-04 DIAGNOSIS — K2930 Chronic superficial gastritis without bleeding: Secondary | ICD-10-CM | POA: Diagnosis not present

## 2019-10-04 DIAGNOSIS — R197 Diarrhea, unspecified: Secondary | ICD-10-CM | POA: Diagnosis not present

## 2019-10-04 DIAGNOSIS — K644 Residual hemorrhoidal skin tags: Secondary | ICD-10-CM | POA: Diagnosis not present

## 2019-10-04 DIAGNOSIS — K3189 Other diseases of stomach and duodenum: Secondary | ICD-10-CM | POA: Diagnosis not present

## 2019-10-04 DIAGNOSIS — K6289 Other specified diseases of anus and rectum: Secondary | ICD-10-CM | POA: Diagnosis not present

## 2019-10-04 DIAGNOSIS — R1030 Lower abdominal pain, unspecified: Secondary | ICD-10-CM | POA: Diagnosis not present

## 2019-10-04 NOTE — Telephone Encounter (Signed)
Left message for pt to return call to triage RN. 

## 2019-10-04 NOTE — Telephone Encounter (Signed)
See refill encounter dated 08/31/19.   Encounter closed.

## 2019-10-04 NOTE — Telephone Encounter (Signed)
Medication refill request: Gabapentin  Last AEX:  04-12-2019 JJ Next AEX: not currently scheduled Last MMG (if hormonal medication request): 08-16-19 density B/BIRADS 1 negative  Refill authorized: Today, please advise.   Medication pended for #90, 1 RF. Please refill if appropriate.

## 2019-10-04 NOTE — Telephone Encounter (Signed)
Can you please check with the patient how she is taking the gabapentin. Previously she was taking 400 mg at night and I told her she could take one 100 mg tablet during the day. I will refill her gabapentin 300 mg tablets. Please see if she needs more 100 mg tablets.

## 2019-10-04 NOTE — Telephone Encounter (Signed)
Terrence Dupont, Francesca Jewett, MD 3 days ago   Hello Dr. Talbert Nan  I hope you are doing well.   I got a message from [place] and haven't had a chance to respond. My dad and I moved to [facility] so I have been extremely busy the past few weeks.  I requested a refill on my Estrace (brand name). She just left a message to make sure if I was out of it. I have about 1 more use.   Can you ask her to approve the refill?   Thank you so much. Have a great day.  Sincerely,   Regino Bellow     Rx to pharmacy on file.   MyChart message to patient.   Routing to Dr. Rosann Auerbach.   Encounter closed.

## 2019-10-07 DIAGNOSIS — K2930 Chronic superficial gastritis without bleeding: Secondary | ICD-10-CM | POA: Diagnosis not present

## 2019-10-07 DIAGNOSIS — K2980 Duodenitis without bleeding: Secondary | ICD-10-CM | POA: Diagnosis not present

## 2019-10-07 DIAGNOSIS — K228 Other specified diseases of esophagus: Secondary | ICD-10-CM | POA: Diagnosis not present

## 2019-10-07 MED ORDER — GABAPENTIN 100 MG PO CAPS: ORAL_CAPSULE | ORAL | 1 refills | Status: DC

## 2019-10-07 NOTE — Telephone Encounter (Signed)
Spoke with pt. Pt updated on Rx refill. Pt agreeable and verbalized understanding.   Routing to Dr Talbert Nan for review.  Encounter closed.

## 2019-10-07 NOTE — Addendum Note (Signed)
Addended by: Georgia Lopes on: 10/07/2019 01:53 PM   Modules accepted: Orders

## 2019-10-07 NOTE — Telephone Encounter (Signed)
Spoke with pt. Pt states is taking gabapentin 400 mg during night and is taking 100mg  during day. Pt states is out of 100mg  tablets. Pharmacy verified. Advised will review with Dr Talbert Nan and send in Rx to pharmacy. Pt agreeable.   Rx gabapentin 100mg  #90, 0RF pended if approved.   Routing to Dr Talbert Nan

## 2019-10-07 NOTE — Telephone Encounter (Signed)
Spoke to [facility] and changed Rx to dispense 180 caps with 1 RF per Dr Talbert Nan. Lanelle Bal confirmed Rx received at [facility].

## 2019-10-07 NOTE — Telephone Encounter (Signed)
Patient is returning call for [facility].

## 2019-10-13 ENCOUNTER — Encounter: Payer: Self-pay | Admitting: Obstetrics and Gynecology

## 2019-10-13 DIAGNOSIS — M4726 Other spondylosis with radiculopathy, lumbar region: Secondary | ICD-10-CM | POA: Diagnosis not present

## 2019-10-13 DIAGNOSIS — Z0100 Encounter for examination of eyes and vision without abnormal findings: Secondary | ICD-10-CM | POA: Diagnosis not present

## 2019-10-13 DIAGNOSIS — Z79891 Long term (current) use of opiate analgesic: Secondary | ICD-10-CM | POA: Diagnosis not present

## 2019-10-13 DIAGNOSIS — G894 Chronic pain syndrome: Secondary | ICD-10-CM | POA: Diagnosis not present

## 2019-10-13 DIAGNOSIS — M5416 Radiculopathy, lumbar region: Secondary | ICD-10-CM | POA: Diagnosis not present

## 2019-10-14 ENCOUNTER — Telehealth: Payer: Self-pay

## 2019-10-14 NOTE — Telephone Encounter (Signed)
Andrea [name] [facility]  Phone Number: [phone]  Good evening Dr. Talbert Nan   I hope you are well.  I went to pick up my Estrace today and they filled Estridole. I didn't realize until I got home and I have a 45 minute drive there. My dad recently moved which is a haul back and forth.   I called them and they said it should have been prescribed as Estrace, no generic, dispensed as written.   I specifically told Colletta Maryland that.   Sharee Pimple worked hard with my insurance to approve Estrace.   The Estridole causes irritation and itching and Sharee Pimple knows about it.   Colletta Maryland didn't specify when she approved it with my pharmacy.   They said I can bring the Estridole back and get Estrace, but it would need to be specified only Estrace. Can Sharee Pimple call and do that for me? I will be back in [facility] Friday for an appointment.   Please advise and thank you in advance for your assistance with this matter.   It's just the story of my life :(   Sincerely,   Andrea [name]

## 2019-10-14 NOTE — Telephone Encounter (Signed)
[facility] with [facility]. Confirmed RX received on 10/04/19 was for Estrace vaginal cream, DAW, brand only. Was advised patient can return estradiol vaginal cream to pharmacy, to be filled as Estrace.   MyChart message to patient advising as seen above.    Routing to provider for final review. Patient is agreeable to disposition. Will close encounter.

## 2019-10-20 ENCOUNTER — Encounter: Payer: Self-pay | Admitting: Family Medicine

## 2019-10-20 DIAGNOSIS — R69 Illness, unspecified: Secondary | ICD-10-CM | POA: Diagnosis not present

## 2019-10-20 DIAGNOSIS — E034 Atrophy of thyroid (acquired): Secondary | ICD-10-CM

## 2019-10-25 ENCOUNTER — Other Ambulatory Visit: Payer: Self-pay | Admitting: Anesthesiology

## 2019-10-25 ENCOUNTER — Ambulatory Visit
Admission: RE | Admit: 2019-10-25 | Discharge: 2019-10-25 | Disposition: A | Discharge status: Home / Self Care | Payer: Medicare HMO | Source: Ambulatory Visit | Attending: Anesthesiology | Admitting: Anesthesiology

## 2019-10-25 ENCOUNTER — Ambulatory Visit: Payer: Medicare HMO | Attending: Family Medicine

## 2019-10-25 ENCOUNTER — Other Ambulatory Visit: Payer: Self-pay

## 2019-10-25 ENCOUNTER — Other Ambulatory Visit: Payer: Self-pay | Admitting: Physical Medicine and Rehabilitation

## 2019-10-25 DIAGNOSIS — R52 Pain, unspecified: Secondary | ICD-10-CM

## 2019-10-25 DIAGNOSIS — E034 Atrophy of thyroid (acquired): Secondary | ICD-10-CM

## 2019-10-25 DIAGNOSIS — S199XXA Unspecified injury of neck, initial encounter: Secondary | ICD-10-CM | POA: Diagnosis not present

## 2019-10-26 ENCOUNTER — Encounter: Payer: Self-pay | Admitting: Family Medicine

## 2019-10-26 DIAGNOSIS — R69 Illness, unspecified: Secondary | ICD-10-CM | POA: Diagnosis not present

## 2019-10-26 LAB — TSH: TSH: 1.24 u[IU]/mL (ref 0.450–4.500)

## 2019-10-27 ENCOUNTER — Other Ambulatory Visit: Payer: Self-pay | Admitting: Family Medicine

## 2019-10-27 MED ORDER — SYNTHROID 112 MCG PO TABS: 112.0000 ug | ORAL_TABLET | Freq: Every day | ORAL | 1 refills | Status: DC

## 2019-11-01 ENCOUNTER — Telehealth: Payer: Self-pay | Admitting: Obstetrics and Gynecology

## 2019-11-01 ENCOUNTER — Encounter: Payer: Self-pay | Admitting: Obstetrics and Gynecology

## 2019-11-01 DIAGNOSIS — Z6833 Body mass index (BMI) 33.0-33.9, adult: Secondary | ICD-10-CM

## 2019-11-01 NOTE — Telephone Encounter (Signed)
Dr. Talbert Nan -please review and advise if OV recommended.

## 2019-11-01 NOTE — Telephone Encounter (Signed)
Health concern Received: Today [name] sent to [facility] Good morning Dr. Talbert Nan   I hope you are well.   I have been trying to lose weight and doing everything from drinking only water or green tea to just drinking smoothies and not eating complete meals.  I asked my doctor to check my thyroid levels which came back good.   I started basically starving myself and ended up passing out and got injured pretty bad from lack of substance in my body.   I also have been exercising 7 days a week.   I lost alot of weight last year after the grief of losing my son, but that was different. I was not eating much at all and was basically bed-ridden. I started actually eating this year and the pounds flooded my body, but all I was trying to do was live and not lay down and die.  Since I am active now and actually eating, I cannot lose this weight, but I want to do it the healthy way.   It is basically abdominal and hip fat.   I have been doing research and I am no doctor or professional on this, but I did find some information that if women don't have estrogen, it decreases the cortisol and blocks weight loss, or low estrogen in general which I am certain I have very low estrogen.  I am sure my estrogen is almost non-existent from my post-menopausal state. I read there are ways to test this.  I don't want to pass out and get hurt again.   Any suggestions?   Thank you very much.   Sincerely,   Andrea [name]

## 2019-11-01 NOTE — Telephone Encounter (Signed)
Please let the patient know that I'm so sorry she is having a hard time. A large metananalysis was done with over 28,000 women and there was no evidence the hormone replacement (including just estrogen replacement) effected body weight. Given her history of a PE, she is not a candidate for ERT.  Please refer her to Dr Alcario Drought weight loss clinic.

## 2019-11-01 NOTE — Telephone Encounter (Signed)
Spoke with patient, advised per Dr. Talbert Nan. Patient is agreeable to referral, order placed. Advised patient she will be contacted by our office referral coordinator with appt details once scheduled. Questions answered. Patient verbalizes understanding and is agreeable.   Routing to [employer] .   Encounter closed.

## 2019-11-03 DIAGNOSIS — R69 Illness, unspecified: Secondary | ICD-10-CM | POA: Diagnosis not present

## 2019-11-14 DIAGNOSIS — J029 Acute pharyngitis, unspecified: Secondary | ICD-10-CM | POA: Diagnosis not present

## 2019-11-19 DIAGNOSIS — R69 Illness, unspecified: Secondary | ICD-10-CM | POA: Diagnosis not present

## 2019-11-22 DIAGNOSIS — R69 Illness, unspecified: Secondary | ICD-10-CM | POA: Diagnosis not present

## 2019-11-23 ENCOUNTER — Telehealth: Payer: Self-pay

## 2019-11-23 NOTE — Telephone Encounter (Signed)
Patient called in regards to having pelvic pain, vaginal pain, and uncomfortable when urinating.

## 2019-11-23 NOTE — Telephone Encounter (Signed)
Call to patient. Patient states she has been having intermittent pelvic/vaginal pain for a "few weeks now." Patient states she has a history of IBS and had a colonoscopy/endoscopy a few weeks ago to rule out anything GI related. States workup was negative. States her pain is mostly in the vaginal area and is having some dysuria that lasts for a while after voiding. Denies fever/chills. Patient states she takes oxycodone as needed for ruptured discs in her back that she states may be masking some of the vaginal pain. Patient states she has not had intercourse in years and had intercourse once with a new partner ~1.5 months ago. Patient reports dyspareunia. States they did use a condom, but patient would like STD screening as a precaution. Hysterectomy for contraception. Using estrace vaginal cream that patient states does help. Requesting OV to make sure "nothing got damaged." OV scheduled for 11-24-19 at 1030. Patient agreeable to date and time of appointment. Covid prescreening negative. Pain precautions for when to seek ER/UC reviewed with patient and she verbalized understanding.   Routing to provider and will close encounter.

## 2019-11-24 ENCOUNTER — Other Ambulatory Visit: Payer: Self-pay

## 2019-11-24 ENCOUNTER — Ambulatory Visit: Payer: Medicare HMO | Admitting: Obstetrics and Gynecology

## 2019-11-24 ENCOUNTER — Encounter: Payer: Self-pay | Admitting: Obstetrics and Gynecology

## 2019-11-24 ENCOUNTER — Other Ambulatory Visit: Payer: Self-pay | Admitting: Family Medicine

## 2019-11-24 VITALS — BP 120/62 | HR 80 | Ht 69.0 in | Wt 214.0 lb

## 2019-11-24 DIAGNOSIS — R300 Dysuria: Secondary | ICD-10-CM | POA: Diagnosis not present

## 2019-11-24 DIAGNOSIS — N9089 Other specified noninflammatory disorders of vulva and perineum: Secondary | ICD-10-CM

## 2019-11-24 DIAGNOSIS — Z113 Encounter for screening for infections with a predominantly sexual mode of transmission: Secondary | ICD-10-CM | POA: Diagnosis not present

## 2019-11-24 DIAGNOSIS — N763 Subacute and chronic vulvitis: Secondary | ICD-10-CM

## 2019-11-24 DIAGNOSIS — R69 Illness, unspecified: Secondary | ICD-10-CM | POA: Diagnosis not present

## 2019-11-24 DIAGNOSIS — R102 Pelvic and perineal pain: Secondary | ICD-10-CM

## 2019-11-24 DIAGNOSIS — E785 Hyperlipidemia, unspecified: Secondary | ICD-10-CM

## 2019-11-24 DIAGNOSIS — R350 Frequency of micturition: Secondary | ICD-10-CM | POA: Diagnosis not present

## 2019-11-24 LAB — POCT URINALYSIS DIPSTICK
Bilirubin, UA: NEGATIVE
Blood, UA: NEGATIVE
Glucose, UA: NEGATIVE
Ketones, UA: NEGATIVE
Nitrite, UA: NEGATIVE
Protein, UA: NEGATIVE
Urobilinogen, UA: NEGATIVE E.U./dL — AB
pH, UA: 7 (ref 5.0–8.0)
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
## 2019-11-25 ENCOUNTER — Encounter: Payer: Self-pay | Admitting: Obstetrics and Gynecology

## 2019-11-25 LAB — URINALYSIS, MICROSCOPIC ONLY
Bacteria, UA: NONE SEEN
Casts: NONE SEEN /lpf
RBC, Urine: NONE SEEN /hpf (ref 0–2)
WBC, UA: NONE SEEN /hpf (ref 0–5)

## 2019-11-25 LAB — RPR: RPR Ser Ql: NONREACTIVE

## 2019-11-25 LAB — HIV ANTIBODY (ROUTINE TESTING W REFLEX): HIV Screen 4th Generation wRfx: NONREACTIVE

## 2019-11-26 ENCOUNTER — Telehealth: Payer: Self-pay

## 2019-11-26 LAB — NUSWAB VAGINITIS PLUS (VG+)
Candida albicans, NAA: NEGATIVE
Candida glabrata, NAA: POSITIVE — AB
Chlamydia trachomatis, NAA: NEGATIVE
Neisseria gonorrhoeae, NAA: NEGATIVE
Trich vag by NAA: NEGATIVE

## 2019-11-26 LAB — HSV NAA
HSV 1 NAA: NEGATIVE
HSV 2 NAA: NEGATIVE

## 2019-11-26 LAB — URINE CULTURE

## 2019-11-26 MED ORDER — SULFAMETHOXAZOLE-TRIMETHOPRIM 800-160 MG PO TABS: 1.0000 | ORAL_TABLET | Freq: Two times a day (BID) | ORAL | 0 refills | Status: AC

## 2019-11-26 MED ORDER — NONFORMULARY OR COMPOUNDED ITEM: 0 refills | Status: DC

## 2019-11-26 NOTE — Telephone Encounter (Signed)
Spoke with pt. Pt given results and recommendations per Dr Talbert Nan. Pt agreeable and verbalized understanding. Rx for Bactrim and Boric Acid sent to pharmacy at [facility]. Pt verbalized understanding.  Pt will use OTC AZO and Ibuprofen as needed.   [name], MD  Georgia Lopes, RN Please let her know that her yeast culture returned with candida galbrata. This type of yeast is less symptomatic and doesn't respond to typical medication.  Please call in boric acid capsules 600 mg VAGINALLY qhs x 14 days. #14, no refills. Toxic if taken orally   Routing to Dr Talbert Nan.  Encounter closed.

## 2019-11-26 NOTE — Telephone Encounter (Signed)
-----   Message from [name], MD sent at 11/26/2019 12:55 PM EDT ----- Please let the patient know that her preliminary urine culture does look consistent with a UTI. Please call in bactrim DS, 1 po bid x 3 days for her. She can use OTC AZO if needed for 2 days.  The herpes culture is negative, the yeast portion of the vaginitis panel is pending, but the rest of her vaginal/cervical swabs are negative.

## 2019-11-26 NOTE — Telephone Encounter (Signed)
Left message for pt to return call to triage RN. 

## 2019-11-29 ENCOUNTER — Encounter (INDEPENDENT_AMBULATORY_CARE_PROVIDER_SITE_OTHER): Payer: Self-pay

## 2019-11-29 DIAGNOSIS — R69 Illness, unspecified: Secondary | ICD-10-CM | POA: Diagnosis not present

## 2019-11-30 DIAGNOSIS — R69 Illness, unspecified: Secondary | ICD-10-CM | POA: Diagnosis not present

## 2019-12-01 ENCOUNTER — Other Ambulatory Visit: Payer: Self-pay | Admitting: Family Medicine

## 2019-12-01 DIAGNOSIS — I10 Essential (primary) hypertension: Secondary | ICD-10-CM

## 2019-12-01 NOTE — Telephone Encounter (Signed)
Requested Prescriptions  Pending Prescriptions Disp Refills  . metoprolol tartrate (LOPRESSOR) 50 MG tablet [Pharmacy Med Name: METOPROLOL TARTRATE 50 MG TAB] 180 tablet 1    Sig: TAKE 1 TABLET BY MOUTH TWICE A DAY     Cardiovascular:  Beta Blockers Passed - 12/01/2019 12:11 AM      Passed - Last BP in normal range    BP Readings from Last 1 Encounters:  11/24/19 120/62         Passed - Last Heart Rate in normal range    Pulse Readings from Last 1 Encounters:  11/24/19 80         Passed - Valid encounter within last 6 months    Recent Outpatient Visits          4 months ago Recurrent herpes labialis   [place], Charlane Ferretti, MD   6 months ago Prediabetes   [place], MD   7 years ago Foot pain   [facility], Janalee Dane, MD

## 2019-12-06 DIAGNOSIS — R69 Illness, unspecified: Secondary | ICD-10-CM | POA: Diagnosis not present

## 2019-12-13 DIAGNOSIS — R69 Illness, unspecified: Secondary | ICD-10-CM | POA: Diagnosis not present

## 2019-12-21 DIAGNOSIS — M5416 Radiculopathy, lumbar region: Secondary | ICD-10-CM | POA: Diagnosis not present

## 2019-12-21 DIAGNOSIS — G894 Chronic pain syndrome: Secondary | ICD-10-CM | POA: Diagnosis not present

## 2019-12-21 DIAGNOSIS — Z79891 Long term (current) use of opiate analgesic: Secondary | ICD-10-CM | POA: Diagnosis not present

## 2019-12-21 DIAGNOSIS — M4726 Other spondylosis with radiculopathy, lumbar region: Secondary | ICD-10-CM | POA: Diagnosis not present

## 2019-12-27 DIAGNOSIS — R69 Illness, unspecified: Secondary | ICD-10-CM | POA: Diagnosis not present

## 2020-01-03 DIAGNOSIS — R69 Illness, unspecified: Secondary | ICD-10-CM | POA: Diagnosis not present

## 2020-01-10 DIAGNOSIS — R69 Illness, unspecified: Secondary | ICD-10-CM | POA: Diagnosis not present

## 2020-01-17 DIAGNOSIS — R69 Illness, unspecified: Secondary | ICD-10-CM | POA: Diagnosis not present

## 2020-01-24 ENCOUNTER — Other Ambulatory Visit: Payer: Self-pay | Admitting: Family Medicine

## 2020-01-24 DIAGNOSIS — B001 Herpesviral vesicular dermatitis: Secondary | ICD-10-CM

## 2020-01-27 DIAGNOSIS — R69 Illness, unspecified: Secondary | ICD-10-CM | POA: Diagnosis not present

## 2020-02-04 DIAGNOSIS — R69 Illness, unspecified: Secondary | ICD-10-CM | POA: Diagnosis not present

## 2020-02-09 ENCOUNTER — Other Ambulatory Visit: Payer: Self-pay | Admitting: Family Medicine

## 2020-02-09 DIAGNOSIS — R110 Nausea: Secondary | ICD-10-CM

## 2020-02-09 DIAGNOSIS — E785 Hyperlipidemia, unspecified: Secondary | ICD-10-CM

## 2020-02-09 NOTE — Telephone Encounter (Signed)
Requested medication (s) are due for refill today: Yes  Requested medication (s) are on the active medication list: Yes  Last refill:  05/24/19 and 09/30/19  Future visit scheduled: No  Notes to clinic:  Unable to refill per protocol, cannot delegate, failed labs, failed encounter     Requested Prescriptions  Pending Prescriptions Disp Refills   ondansetron (ZOFRAN) 4 MG tablet [Pharmacy Med Name: ONDANSETRON HCL 4 MG TABLET] 20 tablet 1    Sig: TAKE 1 TABLET BY MOUTH EVERY 8 HOURS AS NEEDED FOR NAUSEA AND VOMITING      Not Delegated - Gastroenterology: Antiemetics Failed - 02/09/2020  3:58 PM      Failed - This refill cannot be delegated      Failed - Valid encounter within last 6 months    Recent Outpatient Visits           6 months ago Recurrent herpes labialis   [place], [place], MD   8 months ago Prediabetes   [place], Charlane Ferretti, MD   7 years ago Foot pain   [facility], Janalee Dane, MD                ezetimibe (ZETIA) 10 MG tablet [Pharmacy Med Name: EZETIMIBE 10 MG TABLET] 90 tablet 1    Sig: TAKE 1 TABLET BY MOUTH EVERY DAY      Cardiovascular:  Antilipid - Sterol Transport Inhibitors Failed - 02/09/2020  3:58 PM      Failed - Total Cholesterol in normal range and within 360 days    Cholesterol, Total  Date Value Ref Range Status  06/26/2015 238 (H) 100 - 199 mg/dL Final   Cholesterol  Date Value Ref Range Status  12/14/2018 198 0 - 200 mg/dL Final    Comment:    ATP III Classification       Desirable:  < 200 mg/dL               Borderline High:  200 - 239 mg/dL          High:  > = 240 mg/dL          Failed - LDL in normal range and within 360 days    LDL Cholesterol (Calc)  Date Value Ref Range Status  03/13/2018 172 (H) mg/dL (calc) Final    Comment:    Reference range: <100 . Desirable range <100 mg/dL for primary prevention;   <70 mg/dL for patients  with CHD or diabetic patients  with > or = 2 CHD risk factors. Marland Kitchen LDL-C is now calculated using the Martin-Hopkins  calculation, which is a validated novel method providing  better accuracy than the Friedewald equation in the  estimation of LDL-C.  Cresenciano Genre et al. Annamaria Helling. 9381;829(93): 2061-2068  (http://education.[facility].com/faq/FAQ164)    LDL Cholesterol  Date Value Ref Range Status  06/21/2018 171 (H) 0 - 99 mg/dL Final    Comment:           Total Cholesterol/HDL:CHD Risk Coronary Heart Disease Risk Table                     Men   Women  1/2 Average Risk   3.4   3.3  Average Risk       5.0   4.4  2 X Average Risk   9.6   7.1  3 X Average Risk  23.4   11.0  Use the calculated Patient Ratio above and the CHD Risk Table to determine the patient's CHD Risk.        ATP III CLASSIFICATION (LDL):  <100     mg/dL   Optimal  100-129  mg/dL   Near or Above                    Optimal  130-159  mg/dL   Borderline  160-189  mg/dL   High  >190     mg/dL   Very High Performed at [facility] [address] [place], [place] [address]    Direct LDL  Date Value Ref Range Status  12/14/2018 124.0 mg/dL Final    Comment:    Optimal:  <100 mg/dLNear or Above Optimal:  100-129 mg/dLBorderline High:  130-159 mg/dLHigh:  160-189 mg/dLVery High:  >190 mg/dL          Failed - HDL in normal range and within 360 days    HDL  Date Value Ref Range Status  12/14/2018 43.20 >39.00 mg/dL Final  06/26/2015 42 >39 mg/dL Final          Failed - Triglycerides in normal range and within 360 days    Triglycerides  Date Value Ref Range Status  12/14/2018 243.0 (H) 0 - 149 mg/dL Final    Comment:    Normal:  <150 mg/dLBorderline High:  150 - 199 mg/dL          Passed - Valid encounter within last 12 months    Recent Outpatient Visits           6 months ago Recurrent herpes labialis   [place], Charlane Ferretti, MD   8  months ago Prediabetes   [place], [place], MD   7 years ago Foot pain   [facility], Janalee Dane, MD

## 2020-02-10 DIAGNOSIS — R69 Illness, unspecified: Secondary | ICD-10-CM | POA: Diagnosis not present

## 2020-02-15 DIAGNOSIS — Z79891 Long term (current) use of opiate analgesic: Secondary | ICD-10-CM | POA: Diagnosis not present

## 2020-02-15 DIAGNOSIS — M5416 Radiculopathy, lumbar region: Secondary | ICD-10-CM | POA: Diagnosis not present

## 2020-02-15 DIAGNOSIS — M4726 Other spondylosis with radiculopathy, lumbar region: Secondary | ICD-10-CM | POA: Diagnosis not present

## 2020-02-15 DIAGNOSIS — G894 Chronic pain syndrome: Secondary | ICD-10-CM | POA: Diagnosis not present

## 2020-02-15 DIAGNOSIS — R69 Illness, unspecified: Secondary | ICD-10-CM | POA: Diagnosis not present

## 2020-02-17 DIAGNOSIS — R69 Illness, unspecified: Secondary | ICD-10-CM | POA: Diagnosis not present

## 2020-02-22 DIAGNOSIS — R69 Illness, unspecified: Secondary | ICD-10-CM | POA: Diagnosis not present

## 2020-02-26 ENCOUNTER — Other Ambulatory Visit: Payer: Self-pay | Admitting: Obstetrics and Gynecology

## 2020-02-26 ENCOUNTER — Other Ambulatory Visit: Payer: Self-pay | Admitting: Family Medicine

## 2020-02-26 DIAGNOSIS — Z780 Asymptomatic menopausal state: Secondary | ICD-10-CM

## 2020-02-26 DIAGNOSIS — R110 Nausea: Secondary | ICD-10-CM

## 2020-02-26 NOTE — Telephone Encounter (Signed)
Requested medication (s) are due for refill today: yes  Requested medication (s) are on the active medication list: yes  Last refill:  02/09/20   Future visit scheduled: no   Notes to clinic:  called pt and LM on VM to call and make appt.    Requested Prescriptions  Pending Prescriptions Disp Refills   ondansetron (ZOFRAN) 4 MG tablet [Pharmacy Med Name: ONDANSETRON HCL 4 MG TABLET] 20 tablet 0    Sig: TAKE 1 TABLET BY MOUTH EVERY 8 HOURS AS NEEDED FOR NAUSEA AND VOMITING      Not Delegated - Gastroenterology: Antiemetics Failed - 02/26/2020  1:40 PM      Failed - This refill cannot be delegated      Failed - Valid encounter within last 6 months    Recent Outpatient Visits           7 months ago Recurrent herpes labialis   [place], Enobong, MD   9 months ago Prediabetes   [place], MD   7 years ago Foot pain   [facility], Janalee Dane, MD

## 2020-02-28 NOTE — Telephone Encounter (Signed)
Medication refill request: Estrace 0.01%  Last AEX:  04/12/19 Next AEX: 04/19/20 Last MMG (if hormonal medication request): NA Refill authorized: 42.5/0

## 2020-03-06 ENCOUNTER — Other Ambulatory Visit: Payer: Self-pay | Admitting: Family Medicine

## 2020-03-06 DIAGNOSIS — E785 Hyperlipidemia, unspecified: Secondary | ICD-10-CM

## 2020-03-06 NOTE — Telephone Encounter (Signed)
Requested medication (s) are due for refill today: Yes  Requested medication (s) are on the active medication list: Yes  Last refill:  02/09/20  Future visit scheduled: No  Notes to clinic:  Left pt. A message to call and make an appointment.    Requested Prescriptions  Pending Prescriptions Disp Refills   ezetimibe (ZETIA) 10 MG tablet [Pharmacy Med Name: EZETIMIBE 10 MG TABLET] 30 tablet 0    Sig: TAKE 1 TABLET BY MOUTH EVERY DAY      Cardiovascular:  Antilipid - Sterol Transport Inhibitors Failed - 03/06/2020  2:30 PM      Failed - Total Cholesterol in normal range and within 360 days    Cholesterol, Total  Date Value Ref Range Status  06/26/2015 238 (H) 100 - 199 mg/dL Final   Cholesterol  Date Value Ref Range Status  12/14/2018 198 0 - 200 mg/dL Final    Comment:    ATP III Classification       Desirable:  < 200 mg/dL               Borderline High:  200 - 239 mg/dL          High:  > = 240 mg/dL          Failed - LDL in normal range and within 360 days    LDL Cholesterol (Calc)  Date Value Ref Range Status  03/13/2018 172 (H) mg/dL (calc) Final    Comment:    Reference range: <100 . Desirable range <100 mg/dL for primary prevention;   <70 mg/dL for patients with CHD or diabetic patients  with > or = 2 CHD risk factors. Marland Kitchen LDL-C is now calculated using the Martin-Hopkins  calculation, which is a validated novel method providing  better accuracy than the Friedewald equation in the  estimation of LDL-C.  Cresenciano Genre et al. Annamaria Helling. 9675;916(38): 2061-2068  (http://education.[facility].com/faq/FAQ164)    LDL Cholesterol  Date Value Ref Range Status  06/21/2018 171 (H) 0 - 99 mg/dL Final    Comment:           Total Cholesterol/HDL:CHD Risk Coronary Heart Disease Risk Table                     Men   Women  1/2 Average Risk   3.4   3.3  Average Risk       5.0   4.4  2 X Average Risk   9.6   7.1  3 X Average Risk  23.4   11.0        Use the calculated  Patient Ratio above and the CHD Risk Table to determine the patient's CHD Risk.        ATP III CLASSIFICATION (LDL):  <100     mg/dL   Optimal  100-129  mg/dL   Near or Above                    Optimal  130-159  mg/dL   Borderline  160-189  mg/dL   High  >190     mg/dL   Very High Performed at [facility] [address] [place],  [address]    Direct LDL  Date Value Ref Range Status  12/14/2018 124.0 mg/dL Final    Comment:    Optimal:  <100 mg/dLNear or Above Optimal:  100-129 mg/dLBorderline High:  130-159 mg/dLHigh:  160-189 mg/dLVery High:  >190 mg/dL  Failed - HDL in normal range and within 360 days    HDL  Date Value Ref Range Status  12/14/2018 43.20 >39.00 mg/dL Final  06/26/2015 42 >39 mg/dL Final          Failed - Triglycerides in normal range and within 360 days    Triglycerides  Date Value Ref Range Status  12/14/2018 243.0 (H) 0 - 149 mg/dL Final    Comment:    Normal:  <150 mg/dLBorderline High:  150 - 199 mg/dL          Passed - Valid encounter within last 12 months    Recent Outpatient Visits           7 months ago Recurrent herpes labialis   [facility] [place], Charlane Ferretti, MD   9 months ago Prediabetes   [place], Enobong, MD   7 years ago Foot pain   [facility], Janalee Dane, MD

## 2020-03-09 DIAGNOSIS — R69 Illness, unspecified: Secondary | ICD-10-CM | POA: Diagnosis not present

## 2020-03-13 ENCOUNTER — Other Ambulatory Visit: Payer: Self-pay | Admitting: Family Medicine

## 2020-03-13 DIAGNOSIS — I10 Essential (primary) hypertension: Secondary | ICD-10-CM

## 2020-03-14 DIAGNOSIS — R69 Illness, unspecified: Secondary | ICD-10-CM | POA: Diagnosis not present

## 2020-03-20 DIAGNOSIS — R69 Illness, unspecified: Secondary | ICD-10-CM | POA: Diagnosis not present

## 2020-03-26 ENCOUNTER — Other Ambulatory Visit: Payer: Self-pay | Admitting: Family Medicine

## 2020-03-26 DIAGNOSIS — I10 Essential (primary) hypertension: Secondary | ICD-10-CM

## 2020-03-26 DIAGNOSIS — E785 Hyperlipidemia, unspecified: Secondary | ICD-10-CM

## 2020-03-26 NOTE — Telephone Encounter (Signed)
Called pt regarding making app for refills. Pt was given an appt in December. Pt stated that her hair is falling out and she is fatigued most of the time. Pt wanting to check B 12, TSH, Vitamin D and lipid panel.  Pt is in treatment for trauma induced psychosis and is under treatment for SI, and visual and auditory hallucinations.   Pt in search of a support group specifically for families who have experience with or familiar with support for families who have experienced suicide. Pt's son died 07/10/2018 and she is dealing with great loss,sadness  and grief. Pt mainly interested in checking her thyroid levels. Pt asked if lab work could be done before an OV with PCP.  Pt needs an appointment before December. Please call pt.  Pt needs refills of Metoprolol and Zetia.  Requested Prescriptions  Pending Prescriptions Disp Refills  . metoprolol tartrate (LOPRESSOR) 50 MG tablet [Pharmacy Med Name: METOPROLOL TARTRATE 50 MG TAB] 30 tablet 0    Sig: TAKE 1 TABLET BY MOUTH TWICE A DAY     Cardiovascular:  Beta Blockers Failed - 03/26/2020  2:49 PM      Failed - Valid encounter within last 6 months    Recent Outpatient Visits          8 months ago Recurrent herpes labialis   [place], Charlane Ferretti, MD   10 months ago Prediabetes   [place], Enobong, MD   8 years ago Foot pain   [facility], Janalee Dane, MD      Future Appointments            In 1 month Charlott Rakes, MD [place] BP in normal range    BP Readings from Last 1 Encounters:  11/24/19 120/62         Passed - Last Heart Rate in normal range    Pulse Readings from Last 1 Encounters:  11/24/19 80         . ezetimibe (ZETIA) 10 MG tablet 30 tablet 0    Sig: Take 1 tablet (10 mg total) by mouth daily.     Cardiovascular:  Antilipid - Sterol Transport Inhibitors Failed -  03/26/2020  2:49 PM      Failed - Total Cholesterol in normal range and within 360 days    Cholesterol, Total  Date Value Ref Range Status  06/26/2015 238 (H) 100 - 199 mg/dL Final   Cholesterol  Date Value Ref Range Status  12/14/2018 198 0 - 200 mg/dL Final    Comment:    ATP III Classification       Desirable:  < 200 mg/dL               Borderline High:  200 - 239 mg/dL          High:  > = 240 mg/dL         Failed - LDL in normal range and within 360 days    LDL Cholesterol (Calc)  Date Value Ref Range Status  03/13/2018 172 (H) mg/dL (calc) Final    Comment:    Reference range: <100 . Desirable range <100 mg/dL for primary prevention;   <70 mg/dL for patients with CHD or diabetic patients  with > or = 2 CHD risk factors. Marland Kitchen LDL-C is now calculated using the  Martin-Hopkins  calculation, which is a validated novel method providing  better accuracy than the Friedewald equation in the  estimation of LDL-C.  Cresenciano Genre et al. Annamaria Helling. 4098;119(14): 2061-2068  (http://education.[facility].com/faq/FAQ164)    LDL Cholesterol  Date Value Ref Range Status  06/21/2018 171 (H) 0 - 99 mg/dL Final    Comment:           Total Cholesterol/HDL:CHD Risk Coronary Heart Disease Risk Table                     Men   Women  1/2 Average Risk   3.4   3.3  Average Risk       5.0   4.4  2 X Average Risk   9.6   7.1  3 X Average Risk  23.4   11.0        Use the calculated Patient Ratio above and the CHD Risk Table to determine the patient's CHD Risk.        ATP III CLASSIFICATION (LDL):  <100     mg/dL   Optimal  100-129  mg/dL   Near or Above                    Optimal  130-159  mg/dL   Borderline  160-189  mg/dL   High  >190     mg/dL   Very High Performed at [facility] [address] [place], [place] [address]    Direct LDL  Date Value Ref Range Status  12/14/2018 124.0 mg/dL Final    Comment:    Optimal:  <100 mg/dLNear or Above Optimal:  100-129  mg/dLBorderline High:  130-159 mg/dLHigh:  160-189 mg/dLVery High:  >190 mg/dL         Failed - HDL in normal range and within 360 days    HDL  Date Value Ref Range Status  12/14/2018 43.20 >39.00 mg/dL Final  06/26/2015 42 >39 mg/dL Final         Failed - Triglycerides in normal range and within 360 days    Triglycerides  Date Value Ref Range Status  12/14/2018 243.0 (H) 0 - 149 mg/dL Final    Comment:    Normal:  <150 mg/dLBorderline High:  150 - 199 mg/dL         Passed - Valid encounter within last 12 months    Recent Outpatient Visits          8 months ago Recurrent herpes labialis   [place], Charlane Ferretti, MD   10 months ago Prediabetes   [place], Enobong, MD   8 years ago Foot pain   [facility], Janalee Dane, MD      Future Appointments            In 1 month Charlott Rakes, MD [place]             Metoprolol:  Last RF: 03/13/20  RF due: yes  Active med list: yes    Zetia: Last RF:02/09/20 RF due: yes Active med list: yes

## 2020-03-27 DIAGNOSIS — R69 Illness, unspecified: Secondary | ICD-10-CM | POA: Diagnosis not present

## 2020-03-27 MED ORDER — EZETIMIBE 10 MG PO TABS: 10.0000 mg | ORAL_TABLET | Freq: Every day | ORAL | 0 refills | Status: DC

## 2020-04-03 DIAGNOSIS — R69 Illness, unspecified: Secondary | ICD-10-CM | POA: Diagnosis not present

## 2020-04-11 DIAGNOSIS — R69 Illness, unspecified: Secondary | ICD-10-CM | POA: Diagnosis not present

## 2020-04-17 DIAGNOSIS — R69 Illness, unspecified: Secondary | ICD-10-CM | POA: Diagnosis not present

## 2020-04-17 NOTE — Progress Notes (Signed)
44 y.o. G62P2001 Divorced White or Caucasian Not Hispanic or Latino female here for annual exam.    H/O PE on OCP's, Protein S def, hysterectomy/bso at 2. On gabapentin for vasomotor symptoms, she is taking 200 mg at night and 100 mg in the am. Using estrace vaginal cream for atrophy.   80 year old son committed suicide in 1/20. She struggles with depression, she developed trauma induced psychosis. She has had hallucinations, doesn't sleep well. Just started on Seroquel. She is seeing a Teacher, music and a trauma therapist.     C/O dysuria, thinks its external not internal. She has frequent urination. These symptoms have been going on for a long time. She c/o vulvar irritation, some odor. She hasn't tried being sexually active since 7/21 (it hurt).   Patient's last menstrual period was 03/19/2011.          Sexually active: No.  The current method of family planning is none.    Exercising: Yes.    Walking the dog  Smoker:  She is vaping.   Health Maintenance: Pap:  04/12/19 WNL, negative hpv; 02/15/2015 WNL History of abnormal Pap: Yes, cryosurgery in 2005.  MMG:  08/17/19 Density B Bi-rads 1 neg  BMD:   Never  Colonoscopy: 12/20/14 WNL  TDaP:  01/07/14 Gardasil: none    reports that she quit smoking about 16 years ago. Her smoking use included cigarettes and e-cigarettes. She has a 15.00 pack-year smoking history. She has never used smokeless tobacco. She reports that she does not drink alcohol and does not use drugs. She is on disability, lives with her Dad. Difficult relationship with her 77 year old son.   Past Medical History:  Diagnosis Date  . Anxiety   . Arthritis    ruptured lumbar disc-careful with positioning  . Blood dyscrasia    protein s deficiency-no treatment since 2006  . Depression   . Diabetes mellitus without complication ([place])   . GERD (gastroesophageal reflux disease)   . Headache(784.0)   . Hemorrhoids   . High cholesterol   . History of fainting spells of  unknown cause   . History of pulmonary embolism   . Hyperlipemia   . Hypertension   . Hypothyroidism   . IBS (irritable bowel syndrome)   . MVP (mitral valve prolapse)    echo per dr Dagmar Hait  . Protein S deficiency ([place]) 2003   dvt/pulmonary embolus-on coumadin for 6 months then off-Sees [facility]  . PVC (premature ventricular contraction)     Past Surgical History:  Procedure Laterality Date  . BREAST BIOPSY Right 05/27/2018  . CESAREAN SECTION  2006  . CHOLECYSTECTOMY N/A 11/03/2014   Procedure: LAPAROSCOPIC CHOLECYSTECTOMY WITH INTRAOPERATIVE CHOLANGIOGRAM;  Surgeon: Georganna Skeans, MD;  Location: [facility];  Service: General;  Laterality: N/A;  . GYNECOLOGIC CRYOSURGERY    . LAPAROSCOPIC ASSISTED VAGINAL HYSTERECTOMY  03/27/2011   Procedure: LAPAROSCOPIC ASSISTED VAGINAL HYSTERECTOMY;  Surgeon: Cyril Mourning, MD;  Location: [facility];  Service: Gynecology;  Laterality: N/A;  . SALPINGOOPHORECTOMY  03/27/2011   Procedure: SALPINGO OOPHERECTOMY;  Surgeon: Cyril Mourning, MD;  Location: [facility];  Service: Gynecology;  Laterality: Bilateral;  . TONSILLECTOMY  92  . VAGINAL RECONSTRUCTIVE SURGERY  2001    Current Outpatient Medications  Medication Sig Dispense Refill  . ALPRAZolam (XANAX XR) 2 MG 24 hr tablet Take 1 tablet (2 mg total) by mouth daily. For anxiety 5 tablet 0  . ALPRAZolam (XANAX) 1 MG tablet Take 1 mg by mouth  at bedtime as needed for anxiety.    . ARIPiprazole (ABILIFY) 10 MG tablet 1 tablet    . aspirin EC 81 MG tablet Take 81 mg by mouth daily.    . colestipol (COLESTID) 1 g tablet Take two tablets by mouth twice a day 120 tablet 1  . doxepin (SINEQUAN) 10 MG capsule Take 10 mg by mouth at bedtime.    Marland Kitchen ESTRACE VAGINAL 0.1 MG/GM vaginal cream PLACE 1 GRAM VAGINALLY 2 X A WEEK AT BEDTIME. 42.5 g 0  . ezetimibe (ZETIA) 10 MG tablet Take 1 tablet (10 mg total) by mouth daily. 30 tablet 0  . FLUoxetine (PROZAC) 40 MG capsule Take 1 capsule (40 mg total) by mouth  daily. (Patient taking differently: Take 40 mg by mouth 2 (two) times daily. ) 30 capsule 0  . fluticasone (FLONASE) 50 MCG/ACT nasal spray Place 2 sprays into both nostrils daily. 16 g 6  . gabapentin (NEURONTIN) 100 MG capsule TAKE 1 CAPSULE AT BEDTIME with the 300 mg tablet of gabapentin. If needed you can take one tablet in the morning as well. 180 capsule 1  . gabapentin (NEURONTIN) 300 MG capsule TAKE 1 CAPSULE BY MOUTH EVERYDAY AT BEDTIME 90 capsule 1  . metoprolol tartrate (LOPRESSOR) 50 MG tablet TAKE 1 TABLET BY MOUTH TWICE A DAY 60 tablet 0  . ondansetron (ZOFRAN) 4 MG tablet TAKE 1 TABLET BY MOUTH EVERY 8 HOURS AS NEEDED FOR NAUSEA AND VOMITING 20 tablet 0  . oxyCODONE-acetaminophen (PERCOCET) 7.5-325 MG tablet Take 1 tablet by mouth 4 (four) times daily as needed.    Marland Kitchen oxyCODONE-acetaminophen (PERCOCET/ROXICET) 5-325 MG tablet Take 1 tablet by mouth every 6 (six) hours as needed for severe pain.    . prazosin (MINIPRESS) 2 MG capsule Take by mouth 2 (two) times daily.     . QUEtiapine (SEROQUEL) 400 MG tablet Take 400 mg by mouth at bedtime.    . rosuvastatin (CRESTOR) 20 MG tablet TAKE ONE TABLET BY MOUTH ONCE DAILY: FOR HIGH CHOLESTEROL 90 tablet 1  . SYNTHROID 112 MCG tablet Take 1 tablet (112 mcg total) by mouth daily before breakfast. 90 tablet 1  . traZODone (DESYREL) 100 MG tablet     . valACYclovir (VALTREX) 1000 MG tablet TAKE 1 TABLET BY MOUTH TWICE A DAY 20 tablet 0   No current facility-administered medications for this visit.    Family History  Problem Relation Age of Onset  . High blood pressure Mother   . Anxiety disorder Mother   . Depression Mother   . OCD Mother   . Physical abuse Mother   . Alcohol abuse Mother   . Hyperlipidemia Father   . High blood pressure Father   . Hypertension Father   . Cancer Father        skin  . ADD / ADHD Son   . Seizures Son   . ADD / ADHD Son   . Hypertension Other        entire family on both sides  . Asthma Son   .  Asthma Son   . Clotting disorder Maternal Uncle   . Clotting disorder Paternal Grandmother   . Heart disease Maternal Grandfather   . Alcohol abuse Maternal Grandfather   . Anxiety disorder Maternal Grandmother     Review of Systems  All other systems reviewed and are negative.   Exam:   BP 122/64   Pulse 63   Ht 5\' 9"  (1.753 m)   Wt 199 lb  9.6 oz (90.5 kg)   LMP 03/19/2011   SpO2 98%   BMI 29.48 kg/m   Weight change: @WEIGHTCHANGE @ Height:   Height: 5\' 9"  (175.3 cm)  Ht Readings from Last 3 Encounters:  04/19/20 5\' 9"  (1.753 m)  11/24/19 5\' 9"  (1.753 m)  07/27/19 5\' 9"  (1.753 m)    General appearance: alert, cooperative and appears stated age Head: Normocephalic, without obvious abnormality, atraumatic Neck: no adenopathy, supple, symmetrical, trachea midline and thyroid normal to inspection and palpation Lungs: clear to auscultation bilaterally Cardiovascular: regular rate and rhythm Breasts: normal appearance, no masses or tenderness Abdomen: soft, non-tender; non distended,  no masses,  no organomegaly Extremities: extremities normal, atraumatic, no cyanosis or edema Skin: Skin color, texture, turgor normal. No rashes or lesions Lymph nodes: Cervical, supraclavicular, and axillary nodes normal. No abnormal inguinal nodes palpated Neurologic: Grossly normal   Pelvic: External genitalia:  no lesions, mild erythema.               Urethra:  normal appearing urethra with no masses, tenderness or lesions. Just above the urethra is some mild erythema, small cut              Bartholins and Skenes: normal                 Vagina: normal appearing vagina with normal color and discharge, no lesions              Cervix: absent               Bimanual Exam:  Uterus:  uterus absent              Adnexa: no mass, fullness, tenderness               Rectovaginal: Confirms               Anus:  normal sphincter tone, no lesions  Gae Dry chaperoned for the exam.  A:  Well Woman  with normal exam  Dysuria, trace leuk on dip  Vulvar irritation and vaginal odor  Oral HSV  Premature menopause  Clotting disorder  Vasomotor symptoms  Vaginal atrophy  P:   No pap this year  Mammogram in the spring  Colonoscopy 3 months ago was normal.  Labs with primary  Continue gabapentin  Continue vaginal estrogen  Nuswab vaginitis panel  Steroid ointment for short term use  Discussed vulvar skin care  Urine for [place], c&s

## 2020-04-19 ENCOUNTER — Encounter: Payer: Self-pay | Admitting: Obstetrics and Gynecology

## 2020-04-19 ENCOUNTER — Other Ambulatory Visit: Payer: Self-pay

## 2020-04-19 ENCOUNTER — Ambulatory Visit (INDEPENDENT_AMBULATORY_CARE_PROVIDER_SITE_OTHER): Payer: Medicare HMO | Admitting: Obstetrics and Gynecology

## 2020-04-19 VITALS — BP 122/64 | HR 63 | Ht 69.0 in | Wt 199.6 lb

## 2020-04-19 DIAGNOSIS — Z8659 Personal history of other mental and behavioral disorders: Secondary | ICD-10-CM

## 2020-04-19 DIAGNOSIS — N763 Subacute and chronic vulvitis: Secondary | ICD-10-CM | POA: Diagnosis not present

## 2020-04-19 DIAGNOSIS — Z01419 Encounter for gynecological examination (general) (routine) without abnormal findings: Secondary | ICD-10-CM

## 2020-04-19 DIAGNOSIS — N952 Postmenopausal atrophic vaginitis: Secondary | ICD-10-CM

## 2020-04-19 DIAGNOSIS — Z86711 Personal history of pulmonary embolism: Secondary | ICD-10-CM

## 2020-04-19 DIAGNOSIS — B001 Herpesviral vesicular dermatitis: Secondary | ICD-10-CM

## 2020-04-19 DIAGNOSIS — R300 Dysuria: Secondary | ICD-10-CM

## 2020-04-19 DIAGNOSIS — N898 Other specified noninflammatory disorders of vagina: Secondary | ICD-10-CM | POA: Diagnosis not present

## 2020-04-19 DIAGNOSIS — Z780 Asymptomatic menopausal state: Secondary | ICD-10-CM

## 2020-04-19 DIAGNOSIS — R350 Frequency of micturition: Secondary | ICD-10-CM | POA: Diagnosis not present

## 2020-04-19 DIAGNOSIS — R69 Illness, unspecified: Secondary | ICD-10-CM | POA: Diagnosis not present

## 2020-04-19 LAB — POCT URINALYSIS DIPSTICK
Bilirubin, UA: NEGATIVE
Blood, UA: NEGATIVE
Glucose, UA: NEGATIVE
Ketones, UA: NEGATIVE
Nitrite, UA: NEGATIVE
Protein, UA: NEGATIVE
Urobilinogen, UA: NEGATIVE E.U./dL — AB
pH, UA: 6.5 (ref 5.0–8.0)

## 2020-04-19 MED ORDER — GABAPENTIN 300 MG PO CAPS: ORAL_CAPSULE | ORAL | 3 refills | Status: DC

## 2020-04-19 MED ORDER — VALACYCLOVIR HCL 1 G PO TABS: ORAL_TABLET | ORAL | 1 refills | Status: AC

## 2020-04-19 MED ORDER — GABAPENTIN 100 MG PO CAPS
ORAL_CAPSULE | ORAL | 3 refills | Status: DC
Start: 2020-04-19 — End: 2021-03-05

## 2020-04-19 MED ORDER — BETAMETHASONE VALERATE 0.1 % EX OINT: 1.0000 "application " | TOPICAL_OINTMENT | Freq: Two times a day (BID) | CUTANEOUS | 0 refills | Status: DC

## 2020-04-19 MED ORDER — ESTRACE 0.1 MG/GM VA CREA: TOPICAL_CREAM | VAGINAL | 1 refills | Status: DC

## 2020-04-19 NOTE — Patient Instructions (Signed)
EXERCISE   We recommended that you start or continue a regular exercise program for good health. Physical activity is anything that gets your body moving, some is better than none. The CDC recommends 150 minutes per week of Moderate-Intensity Aerobic Activity and 2 or more days of Muscle Strengthening Activity.  Benefits of exercise are limitless: helps weight loss/weight maintenance, improves mood and energy, helps with depression and anxiety, improves sleep, tones and strengthens muscles, improves balance, improves bone density, protects from chronic conditions such as heart disease, high blood pressure and diabetes and so much more. To learn more visit: https://www.cdc.gov/physicalactivity/index.html  DIET: Good nutrition starts with a healthy diet of fruits, vegetables, whole grains, and lean protein sources. Drink plenty of water for hydration. Minimize empty calories, sodium, sweets. For more information about dietary recommendations visit: https://health.gov/our-work/nutrition-physical-activity/dietary-guidelines and https://www.myplate.gov/  ALCOHOL:  Women should limit their alcohol intake to no more than 7 drinks/beers/glasses of wine (combined, not each!) per week. Moderation of alcohol intake to this level decreases your risk of breast cancer and liver damage.  If you are concerned that you may have a problem, or your friends have told you they are concerned about your drinking, there are many resources to help. A well-known program that is free, effective, and available to all people all over the nation is Alcoholics Anonymous.  Check out this site to learn more: https://www.aa.org/   CALCIUM AND VITAMIN D:  Adequate intake of calcium and Vitamin D are recommended for bone health.  The recommendations for exact amounts of these supplements seem to change often, but generally speaking 1000-1500 mg of calcium (between diet and supplement) and 800 units of Vitamin D per day seems prudent.      PAP SMEARS:  Pap smears, to check for cervical cancer or precancers,  have traditionally been done yearly, although recent scientific advances have shown that most women can have pap smears less often.  However, every woman still should have a physical exam from her gynecologist every year. It will include a breast check, inspection of the vulva and vagina to check for abnormal growths or skin changes, a visual exam of the cervix, and then an exam to evaluate the size and shape of the uterus and ovaries.  And after 46 years of age, a rectal exam is indicated to check for rectal cancers. We will also provide age appropriate advice regarding health maintenance, like when you should have certain vaccines, screening for sexually transmitted diseases, bone density testing, colonoscopy, mammograms, etc.   MAMMOGRAMS:  All women over 40 years old should have a routine mammogram.   COLON CANCER SCREENING: Now recommend starting at age 45. At this time colonoscopy is not covered for routine screening until 50. There are take home tests that can be done between 45-49.   COLONOSCOPY:  Colonoscopy to screen for colon cancer is recommended for all women at age 50.  We know, you hate the idea of the prep.  We agree, BUT, having colon cancer and not knowing it is worse!!  Colon cancer so often starts as a polyp that can be seen and removed at colonscopy, which can quite literally save your life!  And if your first colonoscopy is normal and you have no family history of colon cancer, most women don't have to have it again for 10 years.  Once every ten years, you can do something that may end up saving your life, right?  We will be happy to help you get it scheduled when   you are ready.  Be sure to check your insurance coverage so you understand how much it will cost.  It may be covered as a preventative service at no cost, but you should check your particular policy.      Breast Self-Awareness Breast self-awareness  means being familiar with how your breasts look and feel. It involves checking your breasts regularly and reporting any changes to your health care provider. Practicing breast self-awareness is important. A change in your breasts can be a sign of a serious medical problem. Being familiar with how your breasts look and feel allows you to find any problems early, when treatment is more likely to be successful. All women should practice breast self-awareness, including women who have had breast implants. How to do a breast self-exam One way to learn what is normal for your breasts and whether your breasts are changing is to do a breast self-exam. To do a breast self-exam: Look for Changes  1. Remove all the clothing above your waist. 2. Stand in front of a mirror in a room with good lighting. 3. Put your hands on your hips. 4. Push your hands firmly downward. 5. Compare your breasts in the mirror. Look for differences between them (asymmetry), such as: ? Differences in shape. ? Differences in size. ? Puckers, dips, and bumps in one breast and not the other. 6. Look at each breast for changes in your skin, such as: ? Redness. ? Scaly areas. 7. Look for changes in your nipples, such as: ? Discharge. ? Bleeding. ? Dimpling. ? Redness. ? A change in position. Feel for Changes Carefully feel your breasts for lumps and changes. It is best to do this while lying on your back on the floor and again while sitting or standing in the shower or tub with soapy water on your skin. Feel each breast in the following way:  Place the arm on the side of the breast you are examining above your head.  Feel your breast with the other hand.  Start in the nipple area and make  inch (2 cm) overlapping circles to feel your breast. Use the pads of your three middle fingers to do this. Apply light pressure, then medium pressure, then firm pressure. The light pressure will allow you to feel the tissue closest to the  skin. The medium pressure will allow you to feel the tissue that is a little deeper. The firm pressure will allow you to feel the tissue close to the ribs.  Continue the overlapping circles, moving downward over the breast until you feel your ribs below your breast.  Move one finger-width toward the center of the body. Continue to use the  inch (2 cm) overlapping circles to feel your breast as you move slowly up toward your collarbone.  Continue the up and down exam using all three pressures until you reach your armpit.  Write Down What You Find  Write down what is normal for each breast and any changes that you find. Keep a written record with breast changes or normal findings for each breast. By writing this information down, you do not need to depend only on memory for size, tenderness, or location. Write down where you are in your menstrual cycle, if you are still menstruating. If you are having trouble noticing differences in your breasts, do not get discouraged. With time you will become more familiar with the variations in your breasts and more comfortable with the exam. How often should I examine   my breasts? Examine your breasts every month. If you are breastfeeding, the best time to examine your breasts is after a feeding or after using a breast pump. If you menstruate, the best time to examine your breasts is 5-7 days after your period is over. During your period, your breasts are lumpier, and it may be more difficult to notice changes. When should I see my health care provider? See your health care provider if you notice:  A change in shape or size of your breasts or nipples.  A change in the skin of your breast or nipples, such as a reddened or scaly area.  Unusual discharge from your nipples.  A lump or thick area that was not there before.  Pain in your breasts.  Anything that concerns you.  

## 2020-04-20 DIAGNOSIS — M4726 Other spondylosis with radiculopathy, lumbar region: Secondary | ICD-10-CM | POA: Diagnosis not present

## 2020-04-20 DIAGNOSIS — Z79891 Long term (current) use of opiate analgesic: Secondary | ICD-10-CM | POA: Diagnosis not present

## 2020-04-20 DIAGNOSIS — M5416 Radiculopathy, lumbar region: Secondary | ICD-10-CM | POA: Diagnosis not present

## 2020-04-20 DIAGNOSIS — G894 Chronic pain syndrome: Secondary | ICD-10-CM | POA: Diagnosis not present

## 2020-04-21 ENCOUNTER — Other Ambulatory Visit: Payer: Self-pay | Admitting: Obstetrics and Gynecology

## 2020-04-21 ENCOUNTER — Telehealth: Payer: Self-pay

## 2020-04-21 DIAGNOSIS — Z90710 Acquired absence of both cervix and uterus: Secondary | ICD-10-CM

## 2020-04-21 DIAGNOSIS — R300 Dysuria: Secondary | ICD-10-CM

## 2020-04-21 DIAGNOSIS — E119 Type 2 diabetes mellitus without complications: Secondary | ICD-10-CM

## 2020-04-21 LAB — URINE CULTURE

## 2020-04-21 MED ORDER — SULFAMETHOXAZOLE-TRIMETHOPRIM 800-160 MG PO TABS: 1.0000 | ORAL_TABLET | Freq: Two times a day (BID) | ORAL | 0 refills | Status: AC

## 2020-04-21 NOTE — Telephone Encounter (Signed)
Patient is calling in regards to concerns of having multiple UTIs. Wants to discuss possibly seeing Urologist.

## 2020-04-21 NOTE — Telephone Encounter (Signed)
Spoke with pt. Pt given results and recommendations per Dr Talbert Nan. Pt states having UTIs x 2 recently and asking if referral to urology is appropriate. Denies being SA and having flank pain at this time. Pt states " having multiple UTIs and problems since hysterectomy "  Pt aware not all results are back at this time.  Referral placed. Pt aware  Cc: Rosa for referral  Routing to Dr Talbert Nan for review Encounter closed

## 2020-04-22 ENCOUNTER — Other Ambulatory Visit: Payer: Self-pay | Admitting: Obstetrics and Gynecology

## 2020-04-22 DIAGNOSIS — Z780 Asymptomatic menopausal state: Secondary | ICD-10-CM

## 2020-04-23 LAB — NUSWAB VAGINITIS (VG)
Candida albicans, NAA: NEGATIVE
Candida glabrata, NAA: NEGATIVE
Trich vag by NAA: NEGATIVE

## 2020-04-23 LAB — URINALYSIS, MICROSCOPIC ONLY: Casts: NONE SEEN /lpf

## 2020-04-24 DIAGNOSIS — R69 Illness, unspecified: Secondary | ICD-10-CM | POA: Diagnosis not present

## 2020-04-26 DIAGNOSIS — R69 Illness, unspecified: Secondary | ICD-10-CM | POA: Diagnosis not present

## 2020-05-01 ENCOUNTER — Other Ambulatory Visit: Payer: Self-pay | Admitting: Family Medicine

## 2020-05-01 DIAGNOSIS — I10 Essential (primary) hypertension: Secondary | ICD-10-CM

## 2020-05-01 DIAGNOSIS — E785 Hyperlipidemia, unspecified: Secondary | ICD-10-CM

## 2020-05-01 NOTE — Telephone Encounter (Signed)
Courtesy refill provided. Patient has appt scheduled on 05/16/20

## 2020-05-03 DIAGNOSIS — R69 Illness, unspecified: Secondary | ICD-10-CM | POA: Diagnosis not present

## 2020-05-04 DIAGNOSIS — R69 Illness, unspecified: Secondary | ICD-10-CM | POA: Diagnosis not present

## 2020-05-05 ENCOUNTER — Other Ambulatory Visit: Payer: Self-pay | Admitting: Family Medicine

## 2020-05-05 DIAGNOSIS — R69 Illness, unspecified: Secondary | ICD-10-CM | POA: Diagnosis not present

## 2020-05-05 DIAGNOSIS — E785 Hyperlipidemia, unspecified: Secondary | ICD-10-CM

## 2020-05-05 NOTE — Telephone Encounter (Signed)
Requested medications are due for refill today yes  Requested medications are on the active medication list yes  Last refill 9/22  Last visit 05/2019  Future visit scheduled 12/28  Notes to clinic Failed protocol due to no valid labs within 360 days.

## 2020-05-08 DIAGNOSIS — R69 Illness, unspecified: Secondary | ICD-10-CM | POA: Diagnosis not present

## 2020-05-09 DIAGNOSIS — R69 Illness, unspecified: Secondary | ICD-10-CM | POA: Diagnosis not present

## 2020-05-10 DIAGNOSIS — R69 Illness, unspecified: Secondary | ICD-10-CM | POA: Diagnosis not present

## 2020-05-11 DIAGNOSIS — R69 Illness, unspecified: Secondary | ICD-10-CM | POA: Diagnosis not present

## 2020-05-15 DIAGNOSIS — R69 Illness, unspecified: Secondary | ICD-10-CM | POA: Diagnosis not present

## 2020-05-16 ENCOUNTER — Encounter: Payer: Self-pay | Admitting: Family Medicine

## 2020-05-16 ENCOUNTER — Other Ambulatory Visit: Payer: Self-pay

## 2020-05-16 ENCOUNTER — Ambulatory Visit: Payer: Medicare HMO | Attending: Family Medicine | Admitting: Family Medicine

## 2020-05-16 VITALS — BP 129/74 | HR 68 | Ht 69.0 in | Wt 199.6 lb

## 2020-05-16 DIAGNOSIS — R69 Illness, unspecified: Secondary | ICD-10-CM | POA: Diagnosis not present

## 2020-05-16 DIAGNOSIS — F332 Major depressive disorder, recurrent severe without psychotic features: Secondary | ICD-10-CM

## 2020-05-16 DIAGNOSIS — E039 Hypothyroidism, unspecified: Secondary | ICD-10-CM | POA: Diagnosis not present

## 2020-05-16 DIAGNOSIS — F4310 Post-traumatic stress disorder, unspecified: Secondary | ICD-10-CM

## 2020-05-16 DIAGNOSIS — E785 Hyperlipidemia, unspecified: Secondary | ICD-10-CM | POA: Diagnosis not present

## 2020-05-16 DIAGNOSIS — R110 Nausea: Secondary | ICD-10-CM

## 2020-05-16 DIAGNOSIS — I10 Essential (primary) hypertension: Secondary | ICD-10-CM | POA: Diagnosis not present

## 2020-05-16 MED ORDER — METOPROLOL TARTRATE 50 MG PO TABS: 50.0000 mg | ORAL_TABLET | Freq: Two times a day (BID) | ORAL | 1 refills | Status: DC

## 2020-05-16 MED ORDER — ROSUVASTATIN CALCIUM 20 MG PO TABS: 20.0000 mg | ORAL_TABLET | Freq: Every day | ORAL | 1 refills | Status: DC

## 2020-05-16 MED ORDER — ONDANSETRON HCL 4 MG PO TABS: ORAL_TABLET | ORAL | 0 refills | Status: AC

## 2020-05-16 NOTE — Progress Notes (Signed)
Subjective:  Patient ID: Andrea [name], female    DOB: 02-Feb-1976  Age: 44 y.o. MRN: [id number]  CC: Hypothyroidism   HPI Andrea [name]  is a 44 year old female with a history of hypertension, hyperlipidemia,  hypothyroidism, anxiety and Depression, PTSD who presents today  for chronic disease management.  She was diagnosed by her psychiatrist Dr. Toy Care with trauma induced psychosis (as a result of her son committing suicide-2 year anniversary comes up in 05/2020) and is undergoing brain treatments. She is tired of trying 'to kill herself' as she has been unsuccessful on different occasions.  States she is waiting to die from a cancer or an aneurysm.  She endorses anhedonia, lack of motivation. She is undergoing therapy session once a week.` Currently lives with her dad.  She has no friends and she states they all left her after her son passed.  Compliant with her statin, antihypertensive and levothyroxine Past Medical History:  Diagnosis Date  . Anxiety   . Arthritis    ruptured lumbar disc-careful with positioning  . Blood dyscrasia    protein s deficiency-no treatment since 2006  . Depression   . Diabetes mellitus without complication ([place])   . GERD (gastroesophageal reflux disease)   . Headache(784.0)   . Hemorrhoids   . High cholesterol   . History of fainting spells of unknown cause   . History of pulmonary embolism   . Hyperlipemia   . Hypertension   . Hypothyroidism   . IBS (irritable bowel syndrome)   . MVP (mitral valve prolapse)    echo per dr Dagmar Hait  . Protein S deficiency ([place]) 2003   dvt/pulmonary embolus-on coumadin for 6 months then off-Sees [facility]  . PVC (premature ventricular contraction)     Past Surgical History:  Procedure Laterality Date  . BREAST BIOPSY Right 05/27/2018  . CESAREAN SECTION  2006  . CHOLECYSTECTOMY N/A 11/03/2014   Procedure: LAPAROSCOPIC CHOLECYSTECTOMY WITH INTRAOPERATIVE CHOLANGIOGRAM;  Surgeon: Georganna Skeans, MD;   Location: [facility];  Service: General;  Laterality: N/A;  . GYNECOLOGIC CRYOSURGERY    . LAPAROSCOPIC ASSISTED VAGINAL HYSTERECTOMY  03/27/2011   Procedure: LAPAROSCOPIC ASSISTED VAGINAL HYSTERECTOMY;  Surgeon: Cyril Mourning, MD;  Location: [facility];  Service: Gynecology;  Laterality: N/A;  . SALPINGOOPHORECTOMY  03/27/2011   Procedure: SALPINGO OOPHERECTOMY;  Surgeon: Cyril Mourning, MD;  Location: [facility];  Service: Gynecology;  Laterality: Bilateral;  . TONSILLECTOMY  92  . VAGINAL RECONSTRUCTIVE SURGERY  2001    Family History  Problem Relation Age of Onset  . High blood pressure Mother   . Anxiety disorder Mother   . Depression Mother   . OCD Mother   . Physical abuse Mother   . Alcohol abuse Mother   . Hyperlipidemia Father   . High blood pressure Father   . Hypertension Father   . Cancer Father        skin  . ADD / ADHD Son   . Seizures Son   . ADD / ADHD Son   . Hypertension Other        entire family on both sides  . Asthma Son   . Asthma Son   . Clotting disorder Maternal Uncle   . Clotting disorder Paternal Grandmother   . Heart disease Maternal Grandfather   . Alcohol abuse Maternal Grandfather   . Anxiety disorder Maternal Grandmother     No Known Allergies  Outpatient Medications Prior to Visit  Medication Sig Dispense Refill  .  ALPRAZolam (XANAX XR) 2 MG 24 hr tablet Take 1 tablet (2 mg total) by mouth daily. For anxiety 5 tablet 0  . ALPRAZolam (XANAX) 1 MG tablet Take 1 mg by mouth at bedtime as needed for anxiety.    . ARIPiprazole (ABILIFY) 10 MG tablet 1 tablet    . aspirin EC 81 MG tablet Take 81 mg by mouth daily.    . betamethasone valerate ointment (VALISONE) 0.1 % Apply 1 application topically 2 (two) times daily. Can use for up to 2 weeks as needed 15 g 0  . colestipol (COLESTID) 1 g tablet Take two tablets by mouth twice a day 120 tablet 1  . doxepin (SINEQUAN) 10 MG capsule Take 10 mg by mouth at bedtime.    Marland Kitchen ESTRACE VAGINAL 0.1 MG/GM  vaginal cream Place 1 gram vaginally 2 x a week at hs. 42.5 g 1  . ezetimibe (ZETIA) 10 MG tablet TAKE 1 TABLET BY MOUTH EVERY DAY 30 tablet 0  . FLUoxetine (PROZAC) 40 MG capsule Take 1 capsule (40 mg total) by mouth daily. (Patient taking differently: Take 40 mg by mouth 2 (two) times daily.) 30 capsule 0  . fluticasone (FLONASE) 50 MCG/ACT nasal spray Place 2 sprays into both nostrils daily. 16 g 6  . gabapentin (NEURONTIN) 100 MG capsule 1 tablet po qam. Take in addition to the hs dose 90 capsule 3  . gabapentin (NEURONTIN) 300 MG capsule TAKE 1 CAPSULE BY MOUTH EVERYDAY AT BEDTIME 90 capsule 3  . oxyCODONE-acetaminophen (PERCOCET) 7.5-325 MG tablet Take 1 tablet by mouth 4 (four) times daily as needed.    Marland Kitchen oxyCODONE-acetaminophen (PERCOCET/ROXICET) 5-325 MG tablet Take 1 tablet by mouth every 6 (six) hours as needed for severe pain.    . prazosin (MINIPRESS) 2 MG capsule Take by mouth 2 (two) times daily.     . QUEtiapine (SEROQUEL) 400 MG tablet Take 400 mg by mouth at bedtime.    Marland Kitchen SYNTHROID 112 MCG tablet Take 1 tablet (112 mcg total) by mouth daily before breakfast. 90 tablet 1  . traZODone (DESYREL) 100 MG tablet     . valACYclovir (VALTREX) 1000 MG tablet Take 2 tablets po q 12 hours x 2 doses as needed. 30 tablet 1  . metoprolol tartrate (LOPRESSOR) 50 MG tablet TAKE 1 TABLET BY MOUTH TWICE A DAY 60 tablet 0  . ondansetron (ZOFRAN) 4 MG tablet TAKE 1 TABLET BY MOUTH EVERY 8 HOURS AS NEEDED FOR NAUSEA AND VOMITING 20 tablet 0  . rosuvastatin (CRESTOR) 20 MG tablet TAKE ONE TABLET BY MOUTH ONCE DAILY: FOR HIGH CHOLESTEROL 30 tablet 0  . sulfamethoxazole-trimethoprim (BACTRIM DS) 800-160 MG tablet Take 1 tablet by mouth 2 (two) times daily. One PO BID x 3 days (Patient not taking: Reported on 05/16/2020) 6 tablet 0   No facility-administered medications prior to visit.     ROS Review of Systems  Constitutional: Negative for activity change, appetite change and fatigue.  HENT:  Negative for congestion, sinus pressure and sore throat.   Eyes: Negative for visual disturbance.  Respiratory: Negative for cough, chest tightness, shortness of breath and wheezing.   Cardiovascular: Negative for chest pain and palpitations.  Gastrointestinal: Negative for abdominal distention, abdominal pain and constipation.  Endocrine: Negative for polydipsia.  Genitourinary: Negative for dysuria and frequency.  Musculoskeletal: Negative for arthralgias and back pain.  Skin: Negative for rash.  Neurological: Negative for tremors, light-headedness and numbness.  Hematological: Does not bruise/bleed easily.  Psychiatric/Behavioral: Positive for dysphoric mood.  Negative for agitation and behavioral problems.    Objective:  BP 129/74   Pulse 68   Ht 5' 9"  (1.753 m)   Wt 199 lb 9.6 oz (90.5 kg)   LMP 03/19/2011   SpO2 97%   BMI 29.48 kg/m   BP/Weight 05/16/2020 27/09/1698 05/26/4942  Systolic BP 967 591 638  Diastolic BP 74 64 62  Wt. (Lbs) 199.6 199.6 214  BMI 29.48 29.48 31.6  Some encounter information is confidential and restricted. Go to Review Flowsheets activity to see all data.      Physical Exam Constitutional:      Appearance: She is well-developed.  Neck:     Vascular: No JVD.  Cardiovascular:     Rate and Rhythm: Normal rate.     Heart sounds: Normal heart sounds. No murmur heard.   Pulmonary:     Effort: Pulmonary effort is normal.     Breath sounds: Normal breath sounds. No wheezing or rales.  Chest:     Chest wall: No tenderness.  Abdominal:     General: Bowel sounds are normal. There is no distension.     Palpations: Abdomen is soft. There is no mass.     Tenderness: There is no abdominal tenderness.  Musculoskeletal:        General: Normal range of motion.     Right lower leg: No edema.     Left lower leg: No edema.  Neurological:     Mental Status: She is alert and oriented to person, place, and time.  Psychiatric:     Comments: Dysphoric mood      CMP Latest Ref Rng & Units 05/24/2019 02/01/2019 12/14/2018  Glucose 65 - 99 mg/dL 103(H) 97 121(H)  BUN 6 - 24 mg/dL 9 7 10   Creatinine 0.57 - 1.00 mg/dL 0.92 0.83 0.91  Sodium 134 - 144 mmol/L 141 140 138  Potassium 3.5 - 5.2 mmol/L 4.8 4.3 4.1  Chloride 96 - 106 mmol/L 103 102 99  CO2 20 - 29 mmol/L 26 26 31   Calcium 8.7 - 10.2 mg/dL 9.6 10.0 10.1  Total Protein 6.0 - 8.5 g/dL 6.7 6.6 6.5  Total Bilirubin 0.0 - 1.2 mg/dL 0.4 0.6 0.6  Alkaline Phos 39 - 117 IU/L 86 81 76  AST 0 - 40 IU/L 24 43(H) 48(H)  ALT 0 - 32 IU/L 27 57(H) 57(H)    Lipid Panel     Component Value Date/Time   CHOL 198 12/14/2018 1125   CHOL 238 (H) 06/26/2015 0905   TRIG 243.0 (H) 12/14/2018 1125   HDL 43.20 12/14/2018 1125   HDL 42 06/26/2015 0905   CHOLHDL 5 12/14/2018 1125   VLDL 48.6 (H) 12/14/2018 1125   LDLCALC 171 (H) 06/21/2018 0620   LDLCALC 172 (H) 03/13/2018 1025   LDLDIRECT 124.0 12/14/2018 1125    CBC    Component Value Date/Time   WBC 5.5 12/14/2018 1125   RBC 4.62 12/14/2018 1125   HGB 13.7 12/14/2018 1125   HGB 13.5 06/26/2015 0905   HCT 41.7 12/14/2018 1125   HCT 40.5 06/26/2015 0905   PLT 235.0 12/14/2018 1125   PLT 242 06/26/2015 0905   MCV 90.1 12/14/2018 1125   MCV 89 06/26/2015 0905   MCH 29.7 10/29/2018 1129   MCHC 32.9 12/14/2018 1125   RDW 12.6 12/14/2018 1125   RDW 13.2 06/26/2015 0905   LYMPHSABS 2.0 12/14/2018 1125   LYMPHSABS 2.5 06/26/2015 0905   MONOABS 0.4 12/14/2018 1125   EOSABS 0.5 12/14/2018  1125   EOSABS 0.2 06/26/2015 0905   BASOSABS 0.0 12/14/2018 1125   BASOSABS 0.0 06/26/2015 9449    Lab Results  Component Value Date   HGBA1C 5.5 05/24/2019    Assessment & Plan:  1. Acquired hypothyroidism Controlled - T4, free - TSH  2. Hyperlipidemia LDL goal <100 Controlled Low-cholesterol diet - Lipid panel; Future - CMP14+EGFR; Future - rosuvastatin (CRESTOR) 20 MG tablet; Take 1 tablet (20 mg total) by mouth daily.  Dispense: 90 tablet;  Refill: 1  3. Mild nausea, intermittent, associated with vertigo Stable - ondansetron (ZOFRAN) 4 MG tablet; TAKE 1 TABLET BY MOUTH EVERY 8 HOURS AS NEEDED FOR NAUSEA AND VOMITING  Dispense: 20 tablet; Refill: 0  4. Essential hypertension Controlled Counseled on blood pressure goal of less than 130/80, low-sodium, DASH diet, medication compliance, 150 minutes of moderate intensity exercise per week. Discussed medication compliance, adverse effects. - metoprolol tartrate (LOPRESSOR) 50 MG tablet; Take 1 tablet (50 mg total) by mouth 2 (two) times daily.  Dispense: 180 tablet; Refill: 1  5. PTSD (post-traumatic stress disorder) Secondary to her son's suicide Second-year anniversary will be coming up in 05/2019 and this has heightened her symptoms We have discussed coping strategies, use of family support, grief counseling She is currently undergoing therapy sessions weekly and followed by psychiatrist  6. MDD (major depressive disorder), recurrent severe, without psychosis ([place]) Uncontrolled she continues to have suicidal thoughts See #5 above She is currently undergoing breathing treatments/ECT Keep appointment with psychiatry and therapist Continue medications as per psych   Meds ordered this encounter  Medications  . rosuvastatin (CRESTOR) 20 MG tablet    Sig: Take 1 tablet (20 mg total) by mouth daily.    Dispense:  90 tablet    Refill:  1    .  . ondansetron (ZOFRAN) 4 MG tablet    Sig: TAKE 1 TABLET BY MOUTH EVERY 8 HOURS AS NEEDED FOR NAUSEA AND VOMITING    Dispense:  20 tablet    Refill:  0  . metoprolol tartrate (LOPRESSOR) 50 MG tablet    Sig: Take 1 tablet (50 mg total) by mouth 2 (two) times daily.    Dispense:  180 tablet    Refill:  1    Follow-up: Return in about 6 months (around 11/14/2020) for Chronic medical conditions.       Charlott Rakes, MD, FAAFP. [facility] and [facility] [place], [place]   05/16/2020, 3:53 PM

## 2020-05-17 ENCOUNTER — Other Ambulatory Visit: Payer: Self-pay | Admitting: Family Medicine

## 2020-05-17 DIAGNOSIS — R69 Illness, unspecified: Secondary | ICD-10-CM | POA: Diagnosis not present

## 2020-05-17 LAB — TSH: TSH: 0.31 u[IU]/mL — ABNORMAL LOW (ref 0.450–4.500)

## 2020-05-17 LAB — T4, FREE: Free T4: 1.67 ng/dL (ref 0.82–1.77)

## 2020-05-17 MED ORDER — SYNTHROID 100 MCG PO TABS: 100.0000 ug | ORAL_TABLET | Freq: Every day | ORAL | 1 refills | Status: DC

## 2020-05-18 DIAGNOSIS — R69 Illness, unspecified: Secondary | ICD-10-CM | POA: Diagnosis not present

## 2020-05-22 DIAGNOSIS — R69 Illness, unspecified: Secondary | ICD-10-CM | POA: Diagnosis not present

## 2020-05-23 ENCOUNTER — Ambulatory Visit: Payer: Medicare HMO | Attending: Family Medicine

## 2020-05-23 ENCOUNTER — Other Ambulatory Visit: Payer: Self-pay

## 2020-05-23 DIAGNOSIS — R69 Illness, unspecified: Secondary | ICD-10-CM | POA: Diagnosis not present

## 2020-05-23 DIAGNOSIS — E785 Hyperlipidemia, unspecified: Secondary | ICD-10-CM | POA: Diagnosis not present

## 2020-05-24 DIAGNOSIS — R69 Illness, unspecified: Secondary | ICD-10-CM | POA: Diagnosis not present

## 2020-05-24 LAB — CMP14+EGFR
ALT: 16 IU/L (ref 0–32)
AST: 16 IU/L (ref 0–40)
Albumin/Globulin Ratio: 1.6 (ref 1.2–2.2)
Albumin: 4.2 g/dL (ref 3.8–4.8)
Alkaline Phosphatase: 76 IU/L (ref 44–121)
BUN/Creatinine Ratio: 11 (ref 9–23)
BUN: 11 mg/dL (ref 6–24)
Bilirubin Total: 0.7 mg/dL (ref 0.0–1.2)
CO2: 24 mmol/L (ref 20–29)
Calcium: 9.9 mg/dL (ref 8.7–10.2)
Chloride: 101 mmol/L (ref 96–106)
Creatinine, Ser: 1.03 mg/dL — ABNORMAL HIGH (ref 0.57–1.00)
GFR calc Af Amer: 75 mL/min/{1.73_m2} (ref >59)
GFR calc non Af Amer: 65 mL/min/{1.73_m2} (ref >59)
Globulin, Total: 2.6 g/dL (ref 1.5–4.5)
Glucose: 114 mg/dL — ABNORMAL HIGH (ref 65–99)
Potassium: 4.5 mmol/L (ref 3.5–5.2)
Sodium: 139 mmol/L (ref 134–144)
Total Protein: 6.8 g/dL (ref 6.0–8.5)

## 2020-05-24 LAB — LIPID PANEL
Chol/HDL Ratio: 4.7 ratio — ABNORMAL HIGH (ref 0.0–4.4)
Cholesterol, Total: 206 mg/dL — ABNORMAL HIGH (ref 100–199)
HDL: 44 mg/dL (ref >39)
LDL Chol Calc (NIH): 127 mg/dL — ABNORMAL HIGH (ref 0–99)
Triglycerides: 196 mg/dL — ABNORMAL HIGH (ref 0–149)
VLDL Cholesterol Cal: 35 mg/dL (ref 5–40)

## 2020-05-25 DIAGNOSIS — R69 Illness, unspecified: Secondary | ICD-10-CM | POA: Diagnosis not present

## 2020-05-26 DIAGNOSIS — R69 Illness, unspecified: Secondary | ICD-10-CM | POA: Diagnosis not present

## 2020-05-29 ENCOUNTER — Other Ambulatory Visit: Payer: Self-pay | Admitting: Family Medicine

## 2020-05-29 DIAGNOSIS — E785 Hyperlipidemia, unspecified: Secondary | ICD-10-CM

## 2020-05-29 DIAGNOSIS — R69 Illness, unspecified: Secondary | ICD-10-CM | POA: Diagnosis not present

## 2020-05-30 DIAGNOSIS — R69 Illness, unspecified: Secondary | ICD-10-CM | POA: Diagnosis not present

## 2020-05-31 ENCOUNTER — Ambulatory Visit: Payer: Medicare HMO | Admitting: Urology

## 2020-05-31 DIAGNOSIS — R69 Illness, unspecified: Secondary | ICD-10-CM | POA: Diagnosis not present

## 2020-06-01 DIAGNOSIS — R69 Illness, unspecified: Secondary | ICD-10-CM | POA: Diagnosis not present

## 2020-06-02 DIAGNOSIS — R69 Illness, unspecified: Secondary | ICD-10-CM | POA: Diagnosis not present

## 2020-06-06 DIAGNOSIS — R69 Illness, unspecified: Secondary | ICD-10-CM | POA: Diagnosis not present

## 2020-06-07 DIAGNOSIS — R69 Illness, unspecified: Secondary | ICD-10-CM | POA: Diagnosis not present

## 2020-06-08 DIAGNOSIS — R69 Illness, unspecified: Secondary | ICD-10-CM | POA: Diagnosis not present

## 2020-06-09 DIAGNOSIS — R69 Illness, unspecified: Secondary | ICD-10-CM | POA: Diagnosis not present

## 2020-06-09 IMAGING — MG STEREOTACTIC VACUUM ASSIST RIGHT
8 of 13 series · 8 of 21 positions shown · non-contrast
Comparison: Previous exams.

Addendum:
CLINICAL DATA: 42-year-old female for tissue sampling of focal
asymmetry within the UPPER-OUTER RIGHT breast.

EXAM:
RIGHT BREAST STEREOTACTIC CORE NEEDLE BIOPSY

[R (1 of 8)]
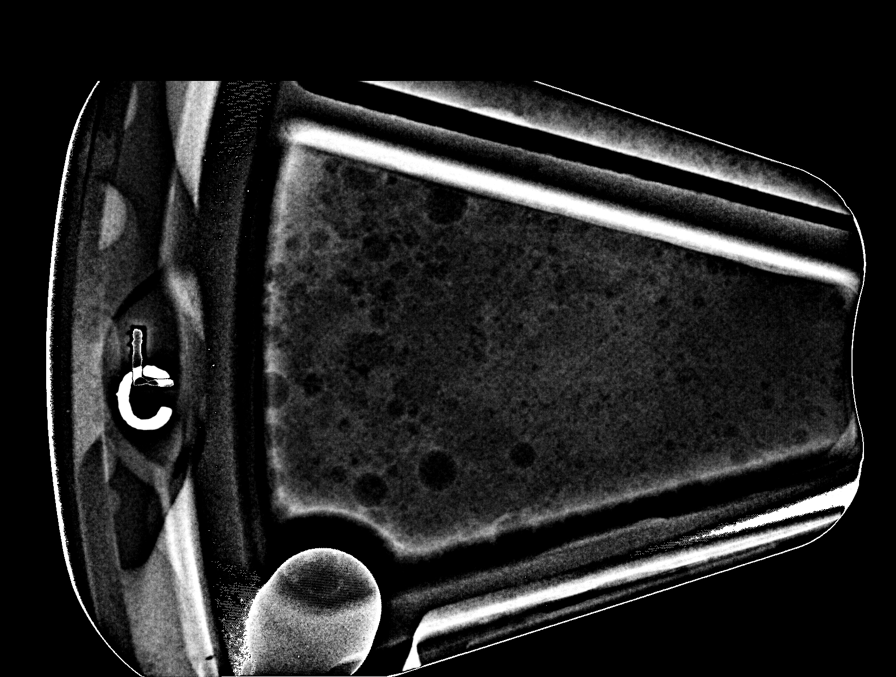

[R (2 of 8)]
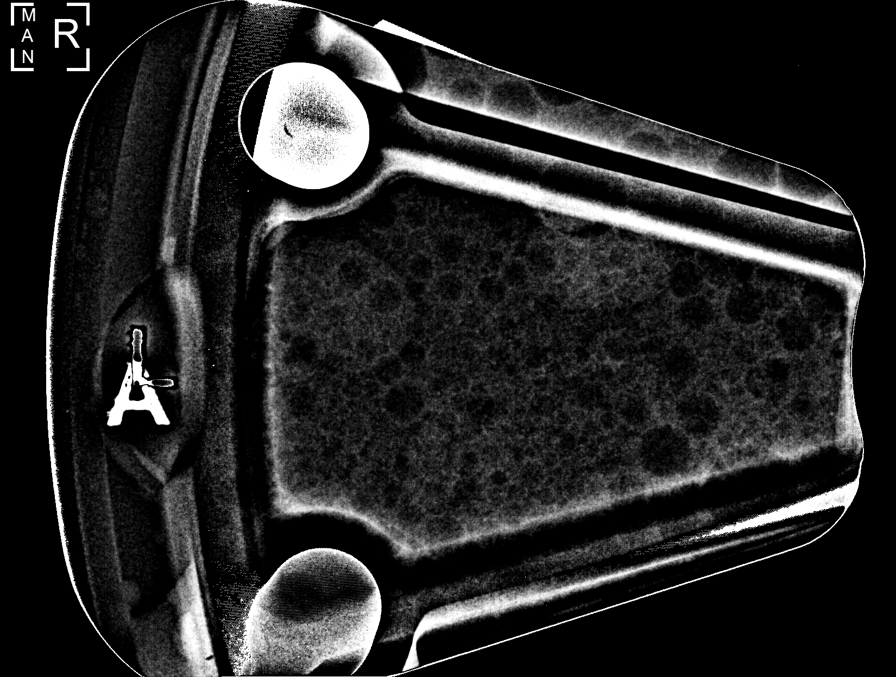

[R (3 of 8)]
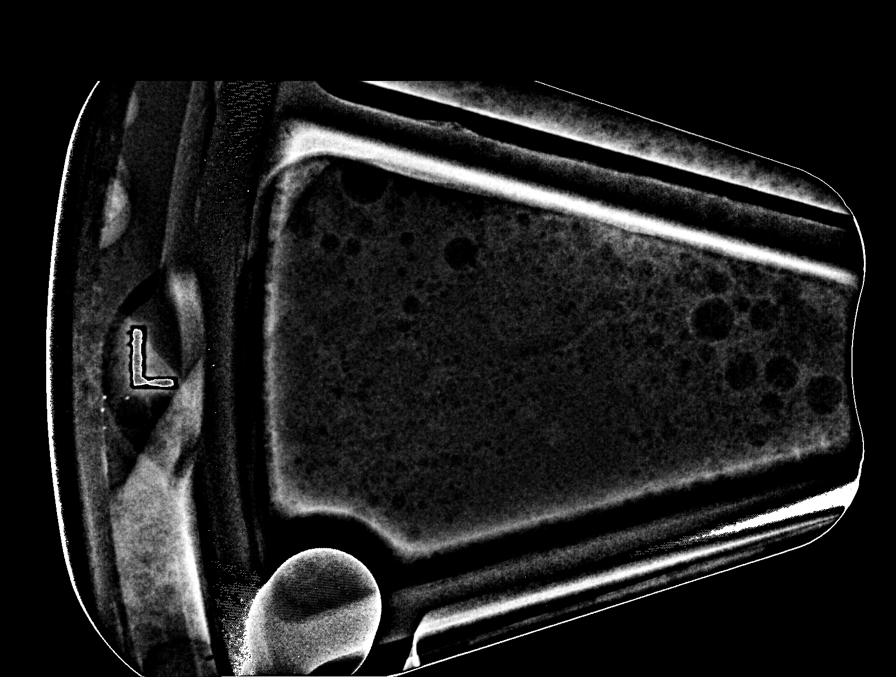

[R (4 of 8)]
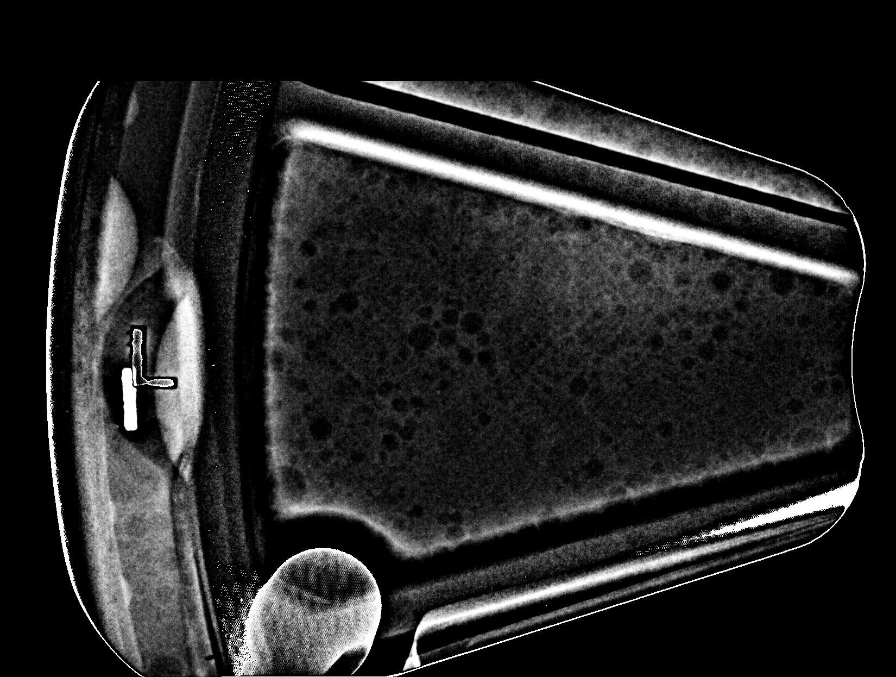

[R (5 of 8)]
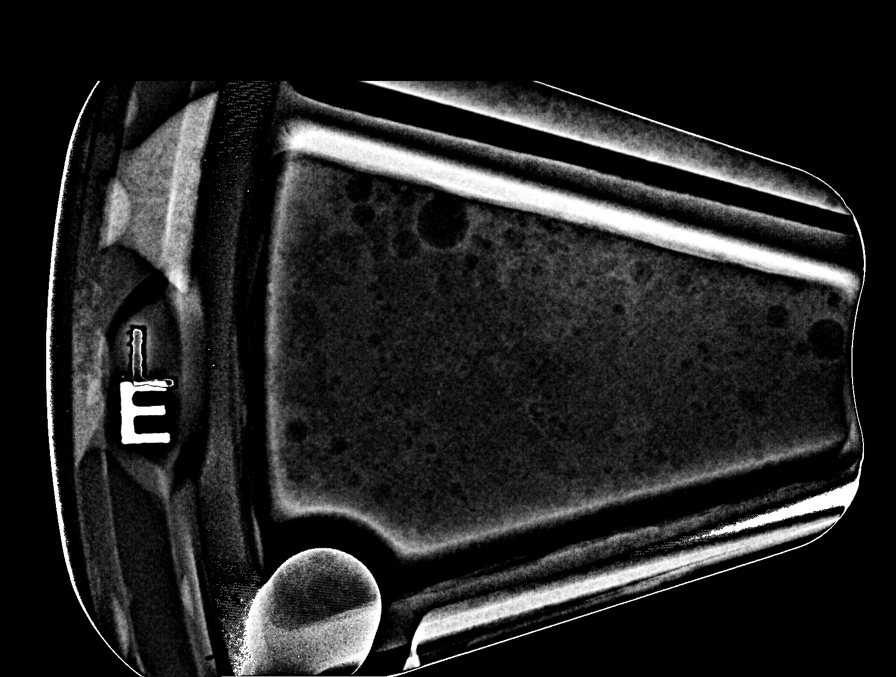

[R (6 of 8)]
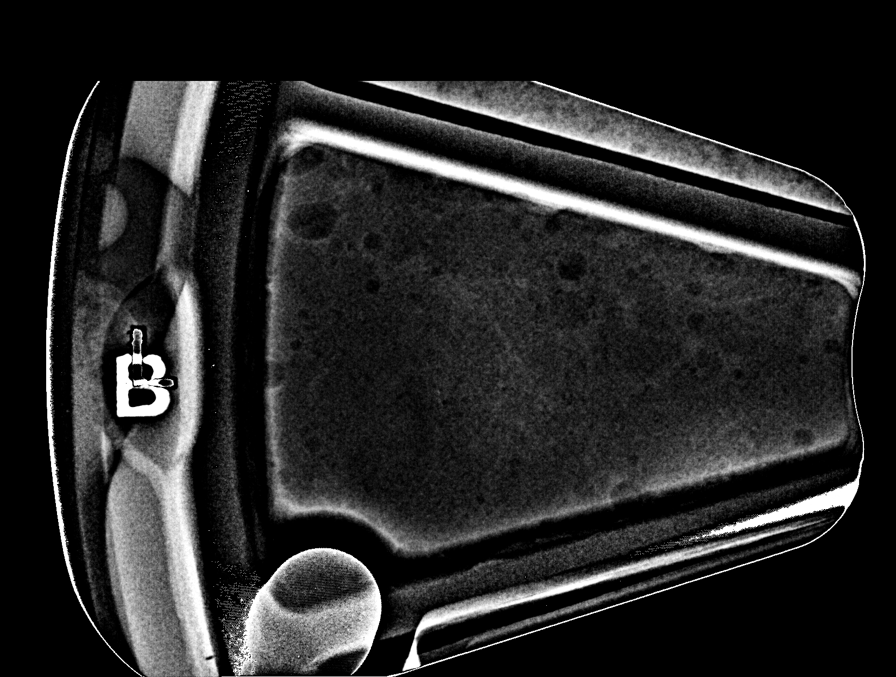

[R (7 of 8)]
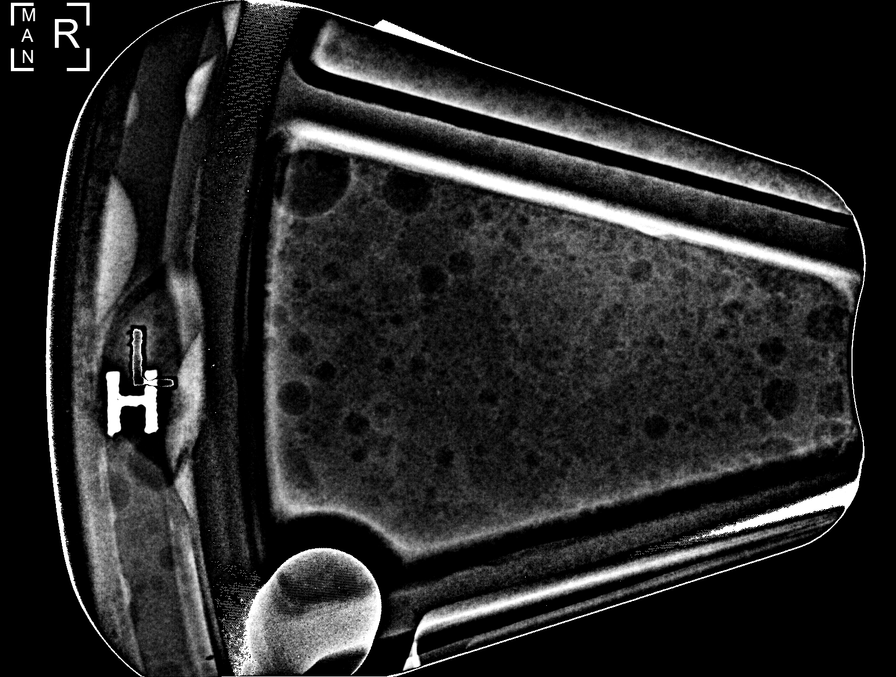

[R (8 of 8)]
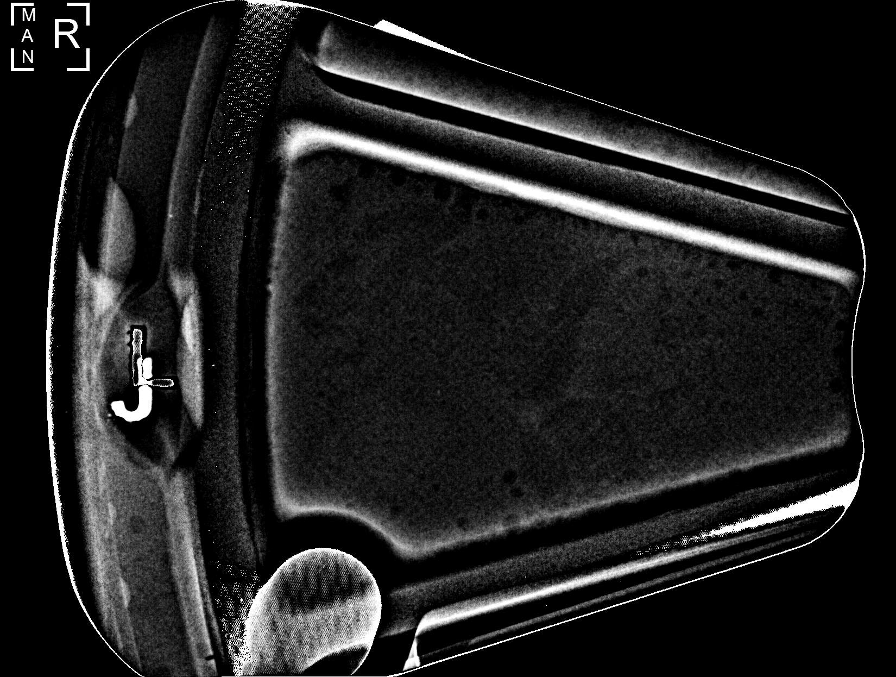

[8 of 21 positions shown; findings below may reference images not displayed]



Using sterile technique and 1% Lidocaine as local anesthetic, under
stereotactic guidance, a 9 gauge vacuum assisted device was used to
perform core needle biopsy of focal asymmetry within the UPPER-OUTER
quadrant of the RIGHT breast using a SUPERIOR approach.

Lesion quadrant: UPPER-OUTER RIGHT breast

At the conclusion of the procedure, a COIL tissue marker clip was
deployed into the biopsy cavity. Follow-up 2-view mammogram was
performed and dictated separately.
IMPRESSION: Stereotactic-guided biopsy of UPPER-OUTER RIGHT breast focal
asymmetry. No apparent complications.

ADDENDUM:
Pathology revealed FIBROCYSTIC CHANGES of the RIGHT breast, upper
outer. This was found to be concordant by Dr. Naila Sidhu.

Pathology results were discussed with the patient by telephone. The
patient reported doing well after the biopsy with tenderness at the
site. Post biopsy instructions and care were reviewed and questions
were answered. The patient was encouraged to call The [REDACTED]

The patient was instructed to return for annual screening
mammography and informed a reminder notice would be sent regarding
this appointment.

Pathology results reported by Ebadat Tiger, RN on 05/28/2018.

*** End of Addendum ***

## 2020-06-12 DIAGNOSIS — R69 Illness, unspecified: Secondary | ICD-10-CM | POA: Diagnosis not present

## 2020-06-13 DIAGNOSIS — G894 Chronic pain syndrome: Secondary | ICD-10-CM | POA: Diagnosis not present

## 2020-06-13 DIAGNOSIS — Z79891 Long term (current) use of opiate analgesic: Secondary | ICD-10-CM | POA: Diagnosis not present

## 2020-06-13 DIAGNOSIS — M5416 Radiculopathy, lumbar region: Secondary | ICD-10-CM | POA: Diagnosis not present

## 2020-06-13 DIAGNOSIS — R69 Illness, unspecified: Secondary | ICD-10-CM | POA: Diagnosis not present

## 2020-06-13 DIAGNOSIS — M4726 Other spondylosis with radiculopathy, lumbar region: Secondary | ICD-10-CM | POA: Diagnosis not present

## 2020-06-14 DIAGNOSIS — R69 Illness, unspecified: Secondary | ICD-10-CM | POA: Diagnosis not present

## 2020-06-15 DIAGNOSIS — R69 Illness, unspecified: Secondary | ICD-10-CM | POA: Diagnosis not present

## 2020-06-19 DIAGNOSIS — R69 Illness, unspecified: Secondary | ICD-10-CM | POA: Diagnosis not present

## 2020-06-20 DIAGNOSIS — R69 Illness, unspecified: Secondary | ICD-10-CM | POA: Diagnosis not present

## 2020-06-27 DIAGNOSIS — R69 Illness, unspecified: Secondary | ICD-10-CM | POA: Diagnosis not present

## 2020-06-29 ENCOUNTER — Other Ambulatory Visit: Payer: Self-pay | Admitting: Family Medicine

## 2020-06-29 DIAGNOSIS — E785 Hyperlipidemia, unspecified: Secondary | ICD-10-CM

## 2020-07-03 DIAGNOSIS — R69 Illness, unspecified: Secondary | ICD-10-CM | POA: Diagnosis not present

## 2020-07-05 DIAGNOSIS — R45851 Suicidal ideations: Secondary | ICD-10-CM | POA: Diagnosis not present

## 2020-07-05 DIAGNOSIS — Z20822 Contact with and (suspected) exposure to covid-19: Secondary | ICD-10-CM | POA: Diagnosis not present

## 2020-07-05 DIAGNOSIS — I10 Essential (primary) hypertension: Secondary | ICD-10-CM | POA: Insufficient documentation

## 2020-07-05 DIAGNOSIS — R69 Illness, unspecified: Secondary | ICD-10-CM | POA: Diagnosis not present

## 2020-07-05 DIAGNOSIS — F603 Borderline personality disorder: Secondary | ICD-10-CM | POA: Diagnosis not present

## 2020-07-05 DIAGNOSIS — F4329 Adjustment disorder with other symptoms: Secondary | ICD-10-CM | POA: Diagnosis not present

## 2020-07-05 DIAGNOSIS — F4381 Prolonged grief disorder: Secondary | ICD-10-CM | POA: Insufficient documentation

## 2020-07-05 DIAGNOSIS — I498 Other specified cardiac arrhythmias: Secondary | ICD-10-CM | POA: Diagnosis not present

## 2020-07-05 DIAGNOSIS — F4310 Post-traumatic stress disorder, unspecified: Secondary | ICD-10-CM | POA: Diagnosis not present

## 2020-07-05 DIAGNOSIS — E039 Hypothyroidism, unspecified: Secondary | ICD-10-CM | POA: Diagnosis not present

## 2020-07-05 DIAGNOSIS — F332 Major depressive disorder, recurrent severe without psychotic features: Secondary | ICD-10-CM | POA: Diagnosis not present

## 2020-07-06 DIAGNOSIS — R69 Illness, unspecified: Secondary | ICD-10-CM | POA: Diagnosis not present

## 2020-07-06 DIAGNOSIS — F4310 Post-traumatic stress disorder, unspecified: Secondary | ICD-10-CM | POA: Diagnosis not present

## 2020-07-06 DIAGNOSIS — F4329 Adjustment disorder with other symptoms: Secondary | ICD-10-CM | POA: Diagnosis not present

## 2020-07-06 DIAGNOSIS — E039 Hypothyroidism, unspecified: Secondary | ICD-10-CM | POA: Diagnosis not present

## 2020-07-06 DIAGNOSIS — I10 Essential (primary) hypertension: Secondary | ICD-10-CM | POA: Diagnosis not present

## 2020-07-07 ENCOUNTER — Other Ambulatory Visit: Payer: Self-pay | Admitting: Family Medicine

## 2020-07-07 DIAGNOSIS — R69 Illness, unspecified: Secondary | ICD-10-CM | POA: Diagnosis not present

## 2020-07-07 DIAGNOSIS — I10 Essential (primary) hypertension: Secondary | ICD-10-CM | POA: Diagnosis not present

## 2020-07-07 DIAGNOSIS — E039 Hypothyroidism, unspecified: Secondary | ICD-10-CM | POA: Diagnosis not present

## 2020-07-07 DIAGNOSIS — F4329 Adjustment disorder with other symptoms: Secondary | ICD-10-CM | POA: Diagnosis not present

## 2020-07-07 DIAGNOSIS — F4310 Post-traumatic stress disorder, unspecified: Secondary | ICD-10-CM | POA: Diagnosis not present

## 2020-07-08 DIAGNOSIS — R69 Illness, unspecified: Secondary | ICD-10-CM | POA: Diagnosis not present

## 2020-07-08 DIAGNOSIS — F4310 Post-traumatic stress disorder, unspecified: Secondary | ICD-10-CM | POA: Diagnosis not present

## 2020-07-08 DIAGNOSIS — I10 Essential (primary) hypertension: Secondary | ICD-10-CM | POA: Diagnosis not present

## 2020-07-08 DIAGNOSIS — F4329 Adjustment disorder with other symptoms: Secondary | ICD-10-CM | POA: Diagnosis not present

## 2020-07-08 DIAGNOSIS — E039 Hypothyroidism, unspecified: Secondary | ICD-10-CM | POA: Diagnosis not present

## 2020-07-09 DIAGNOSIS — F4310 Post-traumatic stress disorder, unspecified: Secondary | ICD-10-CM | POA: Diagnosis not present

## 2020-07-09 DIAGNOSIS — E039 Hypothyroidism, unspecified: Secondary | ICD-10-CM | POA: Diagnosis not present

## 2020-07-09 DIAGNOSIS — R69 Illness, unspecified: Secondary | ICD-10-CM | POA: Diagnosis not present

## 2020-07-09 DIAGNOSIS — F4329 Adjustment disorder with other symptoms: Secondary | ICD-10-CM | POA: Diagnosis not present

## 2020-07-09 DIAGNOSIS — I10 Essential (primary) hypertension: Secondary | ICD-10-CM | POA: Diagnosis not present

## 2020-07-10 DIAGNOSIS — E039 Hypothyroidism, unspecified: Secondary | ICD-10-CM | POA: Diagnosis not present

## 2020-07-10 DIAGNOSIS — F603 Borderline personality disorder: Secondary | ICD-10-CM | POA: Insufficient documentation

## 2020-07-10 DIAGNOSIS — F4329 Adjustment disorder with other symptoms: Secondary | ICD-10-CM | POA: Diagnosis not present

## 2020-07-10 DIAGNOSIS — F4310 Post-traumatic stress disorder, unspecified: Secondary | ICD-10-CM | POA: Diagnosis not present

## 2020-07-10 DIAGNOSIS — I10 Essential (primary) hypertension: Secondary | ICD-10-CM | POA: Diagnosis not present

## 2020-07-10 DIAGNOSIS — R69 Illness, unspecified: Secondary | ICD-10-CM | POA: Diagnosis not present

## 2020-07-11 DIAGNOSIS — F603 Borderline personality disorder: Secondary | ICD-10-CM | POA: Diagnosis not present

## 2020-07-11 DIAGNOSIS — R69 Illness, unspecified: Secondary | ICD-10-CM | POA: Diagnosis not present

## 2020-07-11 DIAGNOSIS — F4329 Adjustment disorder with other symptoms: Secondary | ICD-10-CM | POA: Diagnosis not present

## 2020-07-11 DIAGNOSIS — I10 Essential (primary) hypertension: Secondary | ICD-10-CM | POA: Diagnosis not present

## 2020-07-11 DIAGNOSIS — F4310 Post-traumatic stress disorder, unspecified: Secondary | ICD-10-CM | POA: Diagnosis not present

## 2020-07-11 DIAGNOSIS — E039 Hypothyroidism, unspecified: Secondary | ICD-10-CM | POA: Diagnosis not present

## 2020-07-12 DIAGNOSIS — R69 Illness, unspecified: Secondary | ICD-10-CM | POA: Diagnosis not present

## 2020-07-13 ENCOUNTER — Encounter: Payer: Self-pay | Admitting: Family Medicine

## 2020-07-14 ENCOUNTER — Encounter: Payer: Self-pay | Admitting: Obstetrics and Gynecology

## 2020-07-14 DIAGNOSIS — R3989 Other symptoms and signs involving the genitourinary system: Secondary | ICD-10-CM

## 2020-07-14 NOTE — Telephone Encounter (Signed)
Office notes faxed to [facility].

## 2020-07-17 ENCOUNTER — Other Ambulatory Visit: Payer: Self-pay | Admitting: Obstetrics and Gynecology

## 2020-07-17 ENCOUNTER — Telehealth: Payer: Self-pay | Admitting: *Deleted

## 2020-07-17 NOTE — Telephone Encounter (Signed)
Patient called to schedule her screening mammogram at the breast center. Patient mentioned breast cyst to scheduler, reports you and patient discussed bilateral breast cyst in past. She mentioned feeling a hard spot in her right breast.  Patient said that the breast center will not schedule her screening mammogram due to this.  The breast center is requesting a bilateral diag. Mammogram, patient is requesting order without office visit.  I did explain that office visit is recommended, however patient asked I double check with you. Please advise

## 2020-07-17 NOTE — Telephone Encounter (Signed)
I would recommend that she come for an office visit to evaluate the lump and for comparison in the future

## 2020-07-17 NOTE — BH Specialist Note (Signed)
Opened in error

## 2020-07-18 DIAGNOSIS — H5213 Myopia, bilateral: Secondary | ICD-10-CM | POA: Diagnosis not present

## 2020-07-18 DIAGNOSIS — Z0100 Encounter for examination of eyes and vision without abnormal findings: Secondary | ICD-10-CM | POA: Diagnosis not present

## 2020-07-18 NOTE — Telephone Encounter (Signed)
Patient informed with below note, will forward message to appointments to schedule.

## 2020-07-25 ENCOUNTER — Ambulatory Visit: Payer: Medicare HMO | Admitting: Obstetrics and Gynecology

## 2020-07-25 ENCOUNTER — Other Ambulatory Visit: Payer: Self-pay

## 2020-07-25 ENCOUNTER — Other Ambulatory Visit: Payer: Self-pay | Admitting: Family Medicine

## 2020-07-25 ENCOUNTER — Encounter: Payer: Self-pay | Admitting: Obstetrics and Gynecology

## 2020-07-25 VITALS — BP 122/68 | HR 79 | Ht 69.0 in | Wt 203.0 lb

## 2020-07-25 DIAGNOSIS — N6311 Unspecified lump in the right breast, upper outer quadrant: Secondary | ICD-10-CM

## 2020-07-25 DIAGNOSIS — E785 Hyperlipidemia, unspecified: Secondary | ICD-10-CM
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
## 2020-07-26 ENCOUNTER — Telehealth: Payer: Self-pay | Admitting: *Deleted

## 2020-07-26 DIAGNOSIS — N6311 Unspecified lump in the right breast, upper outer quadrant: Secondary | ICD-10-CM

## 2020-07-26 NOTE — Telephone Encounter (Signed)
-----   Message from [name], MD sent at 07/25/2020  4:37 PM EST ----- Please schedule this patient for diagnostic breast imaging at the breast center, attention 10 o'clock. Thank you! Andrea [name]

## 2020-07-26 NOTE — Telephone Encounter (Signed)
Patient scheduled on 09/07/20 @ 11:00am at the breast center. Sent my chart message

## 2020-07-28 ENCOUNTER — Ambulatory Visit: Payer: Medicare HMO | Admitting: Obstetrics and Gynecology

## 2020-08-02 DIAGNOSIS — R140 Abdominal distension (gaseous): Secondary | ICD-10-CM | POA: Diagnosis not present

## 2020-08-03 DIAGNOSIS — R69 Illness, unspecified: Secondary | ICD-10-CM | POA: Diagnosis not present

## 2020-08-08 DIAGNOSIS — G894 Chronic pain syndrome: Secondary | ICD-10-CM | POA: Diagnosis not present

## 2020-08-08 DIAGNOSIS — M4726 Other spondylosis with radiculopathy, lumbar region: Secondary | ICD-10-CM | POA: Diagnosis not present

## 2020-08-08 DIAGNOSIS — M5416 Radiculopathy, lumbar region: Secondary | ICD-10-CM | POA: Diagnosis not present

## 2020-08-08 DIAGNOSIS — Z79891 Long term (current) use of opiate analgesic: Secondary | ICD-10-CM | POA: Diagnosis not present

## 2020-08-09 DIAGNOSIS — R69 Illness, unspecified: Secondary | ICD-10-CM | POA: Diagnosis not present

## 2020-08-10 ENCOUNTER — Ambulatory Visit: Payer: Medicare HMO | Attending: Family Medicine | Admitting: Family Medicine

## 2020-08-10 ENCOUNTER — Encounter: Payer: Self-pay | Admitting: Family Medicine

## 2020-08-10 ENCOUNTER — Other Ambulatory Visit: Payer: Self-pay

## 2020-08-10 VITALS — BP 129/84 | HR 54 | Resp 17 | Ht 69.0 in | Wt 204.8 lb

## 2020-08-10 DIAGNOSIS — F332 Major depressive disorder, recurrent severe without psychotic features: Secondary | ICD-10-CM | POA: Diagnosis not present

## 2020-08-10 DIAGNOSIS — S0990XA Unspecified injury of head, initial encounter: Secondary | ICD-10-CM | POA: Diagnosis not present

## 2020-08-10 DIAGNOSIS — R69 Illness, unspecified: Secondary | ICD-10-CM | POA: Diagnosis not present

## 2020-08-10 DIAGNOSIS — I10 Essential (primary) hypertension: Secondary | ICD-10-CM | POA: Diagnosis not present

## 2020-08-10 DIAGNOSIS — Z131 Encounter for screening for diabetes mellitus: Secondary | ICD-10-CM | POA: Diagnosis not present

## 2020-08-10 DIAGNOSIS — E034 Atrophy of thyroid (acquired): Secondary | ICD-10-CM | POA: Diagnosis not present

## 2020-08-10 DIAGNOSIS — Z634 Disappearance and death of family member: Secondary | ICD-10-CM

## 2020-08-10 MED ORDER — METOPROLOL TARTRATE 25 MG PO TABS: 25.0000 mg | ORAL_TABLET | Freq: Two times a day (BID) | ORAL | 1 refills | Status: DC

## 2020-08-10 NOTE — Progress Notes (Signed)
Subjective:  Patient ID: Andrea [name], female    DOB: [birth year]  Age: 45 y.o. MRN: [id number]  CC: Hypothyroidism   HPI Andrea [name] is a 45 year old female with a history of hypertension, hyperlipidemia, hypothyroidism, anxiety andDepression, PTSD who presents today for chronic disease management. She was admitted at the [facility] ward for suicide intent.  States she had wanted to die ever since her son committed suicide 2 years ago.  She wanted God to take her away and had not been eating or drinking and had written a suicide letter but notified her psychiatrist and therapist.  Her dad had called 911 and the cops came to get her.  She voluntarily committed herself.  During her hospitalization she stated she had a time to reflect.  Since discharge she has been working with a psychiatrist and her therapist and no longer has suicidal intent but does have some suicidal ideation.  She wants to live and does not plan to hurt herself. During the time when she was starving herself, she fell and hit her head against a box but it didn't bleed.  She is wondering if she needs to get imaging.  Denies loss of consciousness, seizures, abnormal gait.  Compliant with levothyroxine but informs me her labs during hospitalization had revealed she was hypothyroid.  Past Medical History:  Diagnosis Date  . Anxiety   . Arthritis    ruptured lumbar disc-careful with positioning  . Blood dyscrasia    protein s deficiency-no treatment since 2006  . Depression   . Diabetes mellitus without complication ([employer])   . GERD (gastroesophageal reflux disease)   . Headache(784.0)   . Hemorrhoids   . High cholesterol   . History of fainting spells of unknown cause   . History of pulmonary embolism   . Hyperlipemia   . Hypertension   . Hypothyroidism   . IBS (irritable bowel syndrome)   . MVP (mitral valve prolapse)    echo per dr Dagmar Hait  . Protein S deficiency ([place]) 2003   dvt/pulmonary  embolus-on coumadin for 6 months then off-Sees [facility]  . PVC (premature ventricular contraction)     Past Surgical History:  Procedure Laterality Date  . BREAST BIOPSY Right 05/27/2018  . CESAREAN SECTION  2006  . CHOLECYSTECTOMY N/A 11/03/2014   Procedure: LAPAROSCOPIC CHOLECYSTECTOMY WITH INTRAOPERATIVE CHOLANGIOGRAM;  Surgeon: Georganna Skeans, MD;  Location: [facility];  Service: General;  Laterality: N/A;  . GYNECOLOGIC CRYOSURGERY    . LAPAROSCOPIC ASSISTED VAGINAL HYSTERECTOMY  03/27/2011   Procedure: LAPAROSCOPIC ASSISTED VAGINAL HYSTERECTOMY;  Surgeon: Cyril Mourning, MD;  Location: [facility];  Service: Gynecology;  Laterality: N/A;  . SALPINGOOPHORECTOMY  03/27/2011   Procedure: SALPINGO OOPHERECTOMY;  Surgeon: Cyril Mourning, MD;  Location: [facility];  Service: Gynecology;  Laterality: Bilateral;  . TONSILLECTOMY  92  . VAGINAL RECONSTRUCTIVE SURGERY  2001    Family History  Problem Relation Age of Onset  . High blood pressure Mother   . Anxiety disorder Mother   . Depression Mother   . OCD Mother   . Physical abuse Mother   . Alcohol abuse Mother   . Hyperlipidemia Father   . High blood pressure Father   . Hypertension Father   . Cancer Father        skin  . ADD / ADHD Son   . Seizures Son   . ADD / ADHD Son   . Hypertension Other  entire family on both sides  . Asthma Son   . Asthma Son   . Clotting disorder Maternal Uncle   . Clotting disorder Paternal Grandmother   . Heart disease Maternal Grandfather   . Alcohol abuse Maternal Grandfather   . Anxiety disorder Maternal Grandmother     Allergies  Allergen Reactions  . Quetiapine Swelling    Tongue which subsided on its own per pt report.    Outpatient Medications Prior to Visit  Medication Sig Dispense Refill  . ALPRAZolam (XANAX XR) 2 MG 24 hr tablet Take 1 tablet (2 mg total) by mouth daily. For anxiety 5 tablet 0  . ALPRAZolam (XANAX) 1 MG tablet Take 1 mg by mouth at bedtime as needed  for anxiety.    . ARIPiprazole (ABILIFY) 10 MG tablet 1 tablet    . aspirin EC 81 MG tablet Take 81 mg by mouth daily.    . betamethasone valerate ointment (VALISONE) 0.1 % Apply 1 application topically 2 (two) times daily. Can use for up to 2 weeks as needed 15 g 0  . colestipol (COLESTID) 1 g tablet Take two tablets by mouth twice a day 120 tablet 1  . cycloSPORINE (RESTASIS) 0.05 % ophthalmic emulsion Place 1 drop into both eyes 2 (two) times daily.    Marland Kitchen doxepin (SINEQUAN) 10 MG capsule Take 10 mg by mouth at bedtime.    Marland Kitchen ESTRACE VAGINAL 0.1 MG/GM vaginal cream Place 1 gram vaginally 2 x a week at hs. 42.5 g 1  . ezetimibe (ZETIA) 10 MG tablet TAKE 1 TABLET BY MOUTH EVERY DAY 90 tablet 3  . FLUoxetine (PROZAC) 40 MG capsule Take 1 capsule (40 mg total) by mouth daily. (Patient taking differently: Take 40 mg by mouth 2 (two) times daily.) 30 capsule 0  . fluticasone (FLONASE) 50 MCG/ACT nasal spray Place 2 sprays into both nostrils daily. 16 g 6  . gabapentin (NEURONTIN) 100 MG capsule 1 tablet po qam. Take in addition to the hs dose 90 capsule 3  . gabapentin (NEURONTIN) 300 MG capsule TAKE 1 CAPSULE BY MOUTH EVERYDAY AT BEDTIME 90 capsule 3  . metoprolol tartrate (LOPRESSOR) 50 MG tablet Take 1 tablet (50 mg total) by mouth 2 (two) times daily. 180 tablet 1  . ondansetron (ZOFRAN) 4 MG tablet TAKE 1 TABLET BY MOUTH EVERY 8 HOURS AS NEEDED FOR NAUSEA AND VOMITING 20 tablet 0  . oxyCODONE-acetaminophen (PERCOCET) 7.5-325 MG tablet Take 1 tablet by mouth 4 (four) times daily as needed.    Marland Kitchen oxyCODONE-acetaminophen (PERCOCET/ROXICET) 5-325 MG tablet Take 1 tablet by mouth every 6 (six) hours as needed for severe pain.    . prazosin (MINIPRESS) 2 MG capsule Take by mouth 2 (two) times daily.     . QUEtiapine (SEROQUEL) 400 MG tablet Take 400 mg by mouth at bedtime.    . rosuvastatin (CRESTOR) 20 MG tablet Take 1 tablet (20 mg total) by mouth daily. 90 tablet 1  . sulfamethoxazole-trimethoprim  (BACTRIM DS) 800-160 MG tablet Take 1 tablet by mouth 2 (two) times daily. One PO BID x 3 days 6 tablet 0  . SYNTHROID 100 MCG tablet Take 1 tablet (100 mcg total) by mouth daily before breakfast. 90 tablet 1  . traZODone (DESYREL) 100 MG tablet     . valACYclovir (VALTREX) 1000 MG tablet Take 2 tablets po q 12 hours x 2 doses as needed. 30 tablet 1   No facility-administered medications prior to visit.     ROS Review  of Systems  Constitutional: Negative for activity change, appetite change and fatigue.  HENT: Negative for congestion, sinus pressure and sore throat.   Eyes: Negative for visual disturbance.  Respiratory: Negative for cough, chest tightness, shortness of breath and wheezing.   Cardiovascular: Negative for chest pain and palpitations.  Gastrointestinal: Negative for abdominal distention, abdominal pain and constipation.  Endocrine: Negative for polydipsia.  Genitourinary: Negative for dysuria and frequency.  Musculoskeletal: Negative for arthralgias and back pain.  Skin: Negative for rash.  Neurological: Negative for tremors, light-headedness and numbness.  Hematological: Does not bruise/bleed easily.  Psychiatric/Behavioral: Negative for agitation and behavioral problems.    Objective:  BP 129/84   Pulse (!) 54   Resp 17   Ht 5\' 9"  (1.753 m)   Wt 204 lb 12.8 oz (92.9 kg)   LMP 03/19/2011   SpO2 97%   BMI 30.24 kg/m   BP/Weight 08/10/2020 07/25/2020 53/66/4403  Systolic BP 474 259 563  Diastolic BP 84 68 74  Wt. (Lbs) 204.8 203 199.6  BMI 30.24 29.98 29.48  Some encounter information is confidential and restricted. Go to Review Flowsheets activity to see all data.      Physical Exam Constitutional:      Appearance: She is well-developed.  Neck:     Vascular: No JVD.  Cardiovascular:     Rate and Rhythm: Bradycardia present.     Heart sounds: Normal heart sounds. No murmur heard.   Pulmonary:     Effort: Pulmonary effort is normal.     Breath  sounds: Normal breath sounds. No wheezing or rales.  Chest:     Chest wall: No tenderness.  Abdominal:     General: Bowel sounds are normal. There is no distension.     Palpations: Abdomen is soft. There is no mass.     Tenderness: There is no abdominal tenderness.  Musculoskeletal:        General: Normal range of motion.     Right lower leg: No edema.     Left lower leg: No edema.  Neurological:     Mental Status: She is alert and oriented to person, place, and time.  Psychiatric:        Mood and Affect: Mood normal.     CMP Latest Ref Rng & Units 05/23/2020 05/24/2019 02/01/2019  Glucose 65 - 99 mg/dL 114(H) 103(H) 97  BUN 6 - 24 mg/dL 11 9 7   Creatinine 0.57 - 1.00 mg/dL 1.03(H) 0.92 0.83  Sodium 134 - 144 mmol/L 139 141 140  Potassium 3.5 - 5.2 mmol/L 4.5 4.8 4.3  Chloride 96 - 106 mmol/L 101 103 102  CO2 20 - 29 mmol/L 24 26 26   Calcium 8.7 - 10.2 mg/dL 9.9 9.6 10.0  Total Protein 6.0 - 8.5 g/dL 6.8 6.7 6.6  Total Bilirubin 0.0 - 1.2 mg/dL 0.7 0.4 0.6  Alkaline Phos 44 - 121 IU/L 76 86 81  AST 0 - 40 IU/L 16 24 43(H)  ALT 0 - 32 IU/L 16 27 57(H)    Lipid Panel     Component Value Date/Time   CHOL 206 (H) 05/23/2020 1014   TRIG 196 (H) 05/23/2020 1014   HDL 44 05/23/2020 1014   CHOLHDL 4.7 (H) 05/23/2020 1014   CHOLHDL 5 12/14/2018 1125   VLDL 48.6 (H) 12/14/2018 1125   LDLCALC 127 (H) 05/23/2020 1014   LDLCALC 172 (H) 03/13/2018 1025   LDLDIRECT 124.0 12/14/2018 1125    CBC    Component Value Date/Time  WBC 5.5 12/14/2018 1125   RBC 4.62 12/14/2018 1125   HGB 13.7 12/14/2018 1125   HGB 13.5 06/26/2015 0905   HCT 41.7 12/14/2018 1125   HCT 40.5 06/26/2015 0905   PLT 235.0 12/14/2018 1125   PLT 242 06/26/2015 0905   MCV 90.1 12/14/2018 1125   MCV 89 06/26/2015 0905   MCH 29.7 10/29/2018 1129   MCHC 32.9 12/14/2018 1125   RDW 12.6 12/14/2018 1125   RDW 13.2 06/26/2015 0905   LYMPHSABS 2.0 12/14/2018 1125   LYMPHSABS 2.5 06/26/2015 0905   MONOABS 0.4  12/14/2018 1125   EOSABS 0.5 12/14/2018 1125   EOSABS 0.2 06/26/2015 0905   BASOSABS 0.0 12/14/2018 1125   BASOSABS 0.0 06/26/2015 1740    Lab Results  Component Value Date   HGBA1C 5.5 05/24/2019    Assessment & Plan:  1. Essential hypertension Controlled Due to bradycardia have decrease metoprolol from 50 to 25 mg twice daily - metoprolol tartrate (LOPRESSOR) 25 MG tablet; Take 1 tablet (25 mg total) by mouth 2 (two) times daily.  Dispense: 180 tablet; Refill: 1  2. Hypothyroidism due to acquired atrophy of thyroid Previously suppressed TSH We will check thyroid panel and adjust regimen - T4, free - TSH  3. Screening for diabetes mellitus Last A1c was 5.5 - Hemoglobin A1c  4. MDD (major depressive disorder), recurrent severe, without psychosis ([place]) Severe depression exacerbated by bereavement of son 2 years ago Recently hospitalized for suicidal intent She denies suicidal intent at this time and would like to live Depression is still severe but she has made some progress Followed by mental health  5. Traumatic injury of head, initial encounter No abnormal finding on examination of the head Explained to patient that imaging is not indicated at this time  6. Bereavement Bereavement of son 2 years ago She is working through this with her counselor  No orders of the defined types were placed in this encounter.   Follow-up: Return for medical conditions, keep previously scheduled appointment.       Charlott Rakes, MD, FAAFP. [facility], [place]   08/10/2020, 1:35 PM

## 2020-08-10 NOTE — Patient Instructions (Signed)
Managing Loss, Adult People experience loss in many different ways throughout their lives. Events such as moving, changing jobs, and losing friends can create a sense of loss. The loss may be as serious as a major health change, divorce, death of a pet, or death of a loved one. All of these types of loss are likely to create a physical and emotional reaction known as grief. Grief is the result of a major change or an absence of something or someone that you count on. Grief is a normal reaction to loss. A variety of factors can affect your grieving experience, including:  The nature of your loss.  Your relationship to what or whom you lost.  Your understanding of grief and how to manage it.  Your support system. How to manage lifestyle changes Keep to your normal routine as much as possible.  If you have trouble focusing or doing normal activities, it is acceptable to take some time away from your normal routine.  Spend time with friends and loved ones.  Eat a healthy diet, get plenty of sleep, and rest when you feel tired.   How to recognize changes  The way that you deal with your grief will affect your ability to function as you normally do. When grieving, you may experience these changes:  Numbness, shock, sadness, anxiety, anger, denial, and guilt.  Thoughts about death.  Unexpected crying.  A physical sensation of emptiness in your stomach.  Problems sleeping and eating.  Tiredness (fatigue).  Loss of interest in normal activities.  Dreaming about or imagining seeing the person who died.  A need to remember what or whom you lost.  Difficulty thinking about anything other than your loss for a period of time.  Relief. If you have been expecting the loss for a while, you may feel a sense of relief when it happens. Follow these instructions at home: Activity Express your feelings in healthy ways, such as:  Talking with others about your loss. It may be helpful to find  others who have had a similar loss, such as a support group.  Writing down your feelings in a journal.  Doing physical activities to release stress and emotional energy.  Doing creative activities like painting, sculpting, or playing or listening to music.  Practicing resilience. This is the ability to recover and adjust after facing challenges. Reading some resources that encourage resilience may help you to learn ways to practice those behaviors.   General instructions  Be patient with yourself and others. Allow the grieving process to happen, and remember that grieving takes time. ? It is likely that you may never feel completely done with some grief. You may find a way to move on while still cherishing memories and feelings about your loss. ? Accepting your loss is a process. It can take months or longer to adjust.  Keep all follow-up visits as told by your health care provider. This is important. Where to find support To get support for managing loss:  Ask your health care provider for help and recommendations, such as grief counseling or therapy.  Think about joining a support group for people who are managing a loss. Where to find more information You can find more information about managing loss from:  American Society of Clinical Oncology: www.cancer.net  American Psychological Association: www.apa.org Contact a health care provider if:  Your grief is extreme and keeps getting worse.  You have ongoing grief that does not improve.  Your body shows symptoms   of grief, such as illness.  You feel depressed, anxious, or lonely. Get help right away if:  You have thoughts about hurting yourself or others. If you ever feel like you may hurt yourself or others, or have thoughts about taking your own life, get help right away. You can go to your nearest emergency department or call:  Your local emergency services (911 in the [place]  A suicide crisis helpline, such as the  National Suicide Prevention Lifeline at 1-800-273-8255. This is open 24 hours a day. Summary  Grief is the result of a major change or an absence of someone or something that you count on. Grief is a normal reaction to loss.  The depth of grief and the period of recovery depend on the type of loss and your ability to adjust to the change and process your feelings.  Processing grief requires patience and a willingness to accept your feelings and talk about your loss with people who are supportive.  It is important to find resources that work for you and to realize that people experience grief differently. There is not one grieving process that works for everyone in the same way.  Be aware that when grief becomes extreme, it can lead to more severe issues like isolation, depression, anxiety, or suicidal thoughts. Talk with your health care provider if you have any of these issues. This information is not intended to replace advice given to you by your health care provider. Make sure you discuss any questions you have with your health care provider. Document Revised: 10/28/2019 Document Reviewed: 10/28/2019 Elsevier Patient Education  2021 Elsevier Inc.  

## 2020-08-11 ENCOUNTER — Encounter: Payer: Self-pay | Admitting: Family Medicine

## 2020-08-11 LAB — T4, FREE: Free T4: 1.43 ng/dL (ref 0.82–1.77)

## 2020-08-11 LAB — HEMOGLOBIN A1C
Est. average glucose Bld gHb Est-mCnc: 117 mg/dL
Hgb A1c MFr Bld: 5.7 % — ABNORMAL HIGH (ref 4.8–5.6)

## 2020-08-11 LAB — TSH: TSH: 0.67 u[IU]/mL (ref 0.450–4.500)

## 2020-08-15 DIAGNOSIS — R69 Illness, unspecified: Secondary | ICD-10-CM | POA: Diagnosis not present

## 2020-08-23 DIAGNOSIS — R69 Illness, unspecified: Secondary | ICD-10-CM | POA: Diagnosis not present

## 2020-08-28 DIAGNOSIS — R102 Pelvic and perineal pain: Secondary | ICD-10-CM | POA: Diagnosis not present

## 2020-08-28 DIAGNOSIS — R351 Nocturia: Secondary | ICD-10-CM | POA: Diagnosis not present

## 2020-08-28 DIAGNOSIS — R3914 Feeling of incomplete bladder emptying: Secondary | ICD-10-CM | POA: Diagnosis not present

## 2020-08-28 DIAGNOSIS — N3020 Other chronic cystitis without hematuria: Secondary | ICD-10-CM | POA: Diagnosis not present

## 2020-08-28 DIAGNOSIS — N3941 Urge incontinence: Secondary | ICD-10-CM | POA: Diagnosis not present

## 2020-08-28 DIAGNOSIS — N952 Postmenopausal atrophic vaginitis: Secondary | ICD-10-CM | POA: Diagnosis not present

## 2020-08-30 DIAGNOSIS — R69 Illness, unspecified: Secondary | ICD-10-CM | POA: Diagnosis not present

## 2020-09-04 DIAGNOSIS — R69 Illness, unspecified: Secondary | ICD-10-CM | POA: Diagnosis not present

## 2020-09-07 ENCOUNTER — Ambulatory Visit
Admission: RE | Admit: 2020-09-07 | Discharge: 2020-09-07 | Disposition: A | Discharge status: Home / Self Care | Payer: Medicare HMO | Source: Ambulatory Visit | Attending: Obstetrics and Gynecology | Admitting: Obstetrics and Gynecology

## 2020-09-07 ENCOUNTER — Other Ambulatory Visit: Payer: Self-pay

## 2020-09-07 DIAGNOSIS — R922 Inconclusive mammogram: Secondary | ICD-10-CM | POA: Diagnosis not present

## 2020-09-07 DIAGNOSIS — N6311 Unspecified lump in the right breast, upper outer quadrant: Secondary | ICD-10-CM

## 2020-09-07 DIAGNOSIS — N6310 Unspecified lump in the right breast, unspecified quadrant: Secondary | ICD-10-CM | POA: Diagnosis not present

## 2020-09-12 ENCOUNTER — Telehealth: Payer: Self-pay | Admitting: *Deleted

## 2020-09-12 NOTE — Telephone Encounter (Signed)
PA done via cover my meds for estrace cream 0.1mg /g medication approved by [employer] until 05/19/21.

## 2020-09-13 DIAGNOSIS — R69 Illness, unspecified: Secondary | ICD-10-CM | POA: Diagnosis not present

## 2020-09-22 DIAGNOSIS — R69 Illness, unspecified: Secondary | ICD-10-CM | POA: Diagnosis not present

## 2020-09-26 DIAGNOSIS — R69 Illness, unspecified: Secondary | ICD-10-CM | POA: Diagnosis not present

## 2020-10-05 DIAGNOSIS — R69 Illness, unspecified: Secondary | ICD-10-CM | POA: Diagnosis not present

## 2020-10-09 DIAGNOSIS — R69 Illness, unspecified: Secondary | ICD-10-CM | POA: Diagnosis not present

## 2020-10-17 DIAGNOSIS — M4726 Other spondylosis with radiculopathy, lumbar region: Secondary | ICD-10-CM | POA: Diagnosis not present

## 2020-10-17 DIAGNOSIS — Z79891 Long term (current) use of opiate analgesic: Secondary | ICD-10-CM | POA: Diagnosis not present

## 2020-10-17 DIAGNOSIS — M5416 Radiculopathy, lumbar region: Secondary | ICD-10-CM | POA: Diagnosis not present

## 2020-10-17 DIAGNOSIS — G894 Chronic pain syndrome: Secondary | ICD-10-CM | POA: Diagnosis not present

## 2020-11-06 DIAGNOSIS — R69 Illness, unspecified: Secondary | ICD-10-CM | POA: Diagnosis not present

## 2020-11-08 ENCOUNTER — Other Ambulatory Visit: Payer: Self-pay | Admitting: Family Medicine

## 2020-11-14 ENCOUNTER — Telehealth: Payer: Medicare HMO | Admitting: Family Medicine

## 2020-11-15 DIAGNOSIS — R69 Illness, unspecified: Secondary | ICD-10-CM | POA: Diagnosis not present

## 2020-11-21 DIAGNOSIS — R69 Illness, unspecified: Secondary | ICD-10-CM | POA: Diagnosis not present

## 2020-11-27 DIAGNOSIS — R69 Illness, unspecified: Secondary | ICD-10-CM | POA: Diagnosis not present

## 2020-12-04 DIAGNOSIS — R69 Illness, unspecified: Secondary | ICD-10-CM | POA: Diagnosis not present

## 2020-12-07 ENCOUNTER — Other Ambulatory Visit: Payer: Self-pay | Admitting: Family Medicine

## 2020-12-07 DIAGNOSIS — E785 Hyperlipidemia, unspecified: Secondary | ICD-10-CM

## 2020-12-07 NOTE — Telephone Encounter (Signed)
   Notes to clinic:  Patient had appointment on 11/14/2020 and was canceled  Review for refills    Requested Prescriptions  Pending Prescriptions Disp Refills   rosuvastatin (CRESTOR) 20 MG tablet [Pharmacy Med Name: ROSUVASTATIN CALCIUM 20 MG TAB] 90 tablet 1    Sig: TAKE 1 TABLET BY MOUTH EVERY DAY      Cardiovascular:  Antilipid - Statins Failed - 12/07/2020  4:07 AM      Failed - Total Cholesterol in normal range and within 360 days    Cholesterol, Total  Date Value Ref Range Status  05/23/2020 206 (H) 100 - 199 mg/dL Final          Failed - LDL in normal range and within 360 days    LDL Cholesterol (Calc)  Date Value Ref Range Status  03/13/2018 172 (H) mg/dL (calc) Final    Comment:    Reference range: <100 . Desirable range <100 mg/dL for primary prevention;   <70 mg/dL for patients with CHD or diabetic patients  with > or = 2 CHD risk factors. Marland Kitchen LDL-C is now calculated using the Martin-Hopkins  calculation, which is a validated novel method providing  better accuracy than the Friedewald equation in the  estimation of LDL-C.  Cresenciano Genre et al. Annamaria Helling. 5374;827(07): 2061-2068  (http://education.[facility].com/faq/FAQ164)    LDL Chol Calc (NIH)  Date Value Ref Range Status  05/23/2020 127 (H) 0 - 99 mg/dL Final   Direct LDL  Date Value Ref Range Status  12/14/2018 124.0 mg/dL Final    Comment:    Optimal:  <100 mg/dLNear or Above Optimal:  100-129 mg/dLBorderline High:  130-159 mg/dLHigh:  160-189 mg/dLVery High:  >190 mg/dL          Failed - Triglycerides in normal range and within 360 days    Triglycerides  Date Value Ref Range Status  05/23/2020 196 (H) 0 - 149 mg/dL Final          Passed - HDL in normal range and within 360 days    HDL  Date Value Ref Range Status  05/23/2020 44 >39 mg/dL Final          Passed - Patient is not pregnant      Passed - Valid encounter within last 12 months    Recent Outpatient Visits           3 months ago  Hypothyroidism due to acquired atrophy of thyroid   [place], Enobong, MD   6 months ago Acquired hypothyroidism   [place], Enobong, MD   1 year ago Recurrent herpes labialis   [place], Enobong, MD   1 year ago Prediabetes   [place], Enobong, MD   8 years ago Foot pain   [facility], Janalee Dane, MD

## 2020-12-11 DIAGNOSIS — R69 Illness, unspecified: Secondary | ICD-10-CM | POA: Diagnosis not present

## 2020-12-18 DIAGNOSIS — R69 Illness, unspecified: Secondary | ICD-10-CM | POA: Diagnosis not present

## 2020-12-21 DIAGNOSIS — M4726 Other spondylosis with radiculopathy, lumbar region: Secondary | ICD-10-CM | POA: Diagnosis not present

## 2020-12-21 DIAGNOSIS — G894 Chronic pain syndrome: Secondary | ICD-10-CM | POA: Diagnosis not present

## 2020-12-21 DIAGNOSIS — Z79891 Long term (current) use of opiate analgesic: Secondary | ICD-10-CM | POA: Diagnosis not present

## 2020-12-21 DIAGNOSIS — M5416 Radiculopathy, lumbar region: Secondary | ICD-10-CM | POA: Diagnosis not present

## 2020-12-22 ENCOUNTER — Other Ambulatory Visit: Payer: Self-pay | Admitting: Family Medicine

## 2020-12-22 DIAGNOSIS — E785 Hyperlipidemia, unspecified: Secondary | ICD-10-CM

## 2020-12-23 NOTE — Telephone Encounter (Signed)
Pt overdue lab work and [place]. MyChart message sent to pt to call office to make appt. Last RF was a 30 day courtesy RF: 12/08/20 #30. Requested Prescriptions  Pending Prescriptions Disp Refills   rosuvastatin (CRESTOR) 20 MG tablet [Pharmacy Med Name: ROSUVASTATIN CALCIUM 20 MG TAB] 90 tablet 1    Sig: TAKE 1 TABLET BY MOUTH EVERY DAY      Cardiovascular:  Antilipid - Statins Failed - 12/22/2020  6:53 PM      Failed - Total Cholesterol in normal range and within 360 days    Cholesterol, Total  Date Value Ref Range Status  05/23/2020 206 (H) 100 - 199 mg/dL Final          Failed - LDL in normal range and within 360 days    LDL Cholesterol (Calc)  Date Value Ref Range Status  03/13/2018 172 (H) mg/dL (calc) Final    Comment:    Reference range: <100 . Desirable range <100 mg/dL for primary prevention;   <70 mg/dL for patients with CHD or diabetic patients  with > or = 2 CHD risk factors. Marland Kitchen LDL-C is now calculated using the Martin-Hopkins  calculation, which is a validated novel method providing  better accuracy than the Friedewald equation in the  estimation of LDL-C.  Cresenciano Genre et al. Annamaria Helling. [id number]): 2061-2068  (http://education.[facility].com/faq/FAQ164)    LDL Chol Calc (NIH)  Date Value Ref Range Status  05/23/2020 127 (H) 0 - 99 mg/dL Final   Direct LDL  Date Value Ref Range Status  12/14/2018 124.0 mg/dL Final    Comment:    Optimal:  <100 mg/dLNear or Above Optimal:  100-129 mg/dLBorderline High:  130-159 mg/dLHigh:  160-189 mg/dLVery High:  >190 mg/dL          Failed - Triglycerides in normal range and within 360 days    Triglycerides  Date Value Ref Range Status  05/23/2020 196 (H) 0 - 149 mg/dL Final          Passed - HDL in normal range and within 360 days    HDL  Date Value Ref Range Status  05/23/2020 44 >39 mg/dL Final          Passed - Patient is not pregnant      Passed - Valid encounter within last 12 months    Recent Outpatient  Visits           4 months ago Hypothyroidism due to acquired atrophy of thyroid   [place], Enobong, MD   7 months ago Acquired hypothyroidism   [place], Enobong, MD   1 year ago Recurrent herpes labialis   [place], Enobong, MD   1 year ago Prediabetes   [place], Enobong, MD   8 years ago Foot pain   [facility], Janalee Dane, MD

## 2020-12-25 DIAGNOSIS — R69 Illness, unspecified: Secondary | ICD-10-CM | POA: Diagnosis not present

## 2021-01-01 DIAGNOSIS — R69 Illness, unspecified: Secondary | ICD-10-CM | POA: Diagnosis not present

## 2021-01-08 DIAGNOSIS — R69 Illness, unspecified: Secondary | ICD-10-CM | POA: Diagnosis not present

## 2021-01-14 ENCOUNTER — Telehealth: Payer: Self-pay

## 2021-01-14 NOTE — Telephone Encounter (Signed)
Called pt to schedule AWV. No answer left message to call and schedule when available. At [phone]

## 2021-01-15 ENCOUNTER — Other Ambulatory Visit: Payer: Self-pay | Admitting: Family Medicine

## 2021-01-15 ENCOUNTER — Encounter: Payer: Self-pay | Admitting: Family Medicine

## 2021-01-15 DIAGNOSIS — R69 Illness, unspecified: Secondary | ICD-10-CM | POA: Diagnosis not present

## 2021-01-15 DIAGNOSIS — E034 Atrophy of thyroid (acquired): Secondary | ICD-10-CM

## 2021-01-18 DIAGNOSIS — Z20822 Contact with and (suspected) exposure to covid-19: Secondary | ICD-10-CM | POA: Diagnosis not present

## 2021-01-19 ENCOUNTER — Ambulatory Visit: Payer: Medicare HMO | Attending: Family Medicine

## 2021-01-19 ENCOUNTER — Other Ambulatory Visit: Payer: Self-pay

## 2021-01-19 DIAGNOSIS — E034 Atrophy of thyroid (acquired): Secondary | ICD-10-CM | POA: Diagnosis not present

## 2021-01-20 ENCOUNTER — Encounter: Payer: Self-pay | Admitting: Family Medicine

## 2021-01-20 ENCOUNTER — Other Ambulatory Visit: Payer: Self-pay | Admitting: Family Medicine

## 2021-01-20 LAB — TSH: TSH: 0.27 u[IU]/mL — ABNORMAL LOW (ref 0.450–4.500)

## 2021-01-20 LAB — T4, FREE: Free T4: 1.48 ng/dL (ref 0.82–1.77)

## 2021-01-20 MED ORDER — SYNTHROID 88 MCG PO TABS: 88.0000 ug | ORAL_TABLET | Freq: Every day | ORAL | 3 refills | Status: DC

## 2021-01-22 DIAGNOSIS — F4310 Post-traumatic stress disorder, unspecified: Secondary | ICD-10-CM | POA: Diagnosis not present

## 2021-01-22 DIAGNOSIS — R69 Illness, unspecified: Secondary | ICD-10-CM | POA: Diagnosis not present

## 2021-01-29 ENCOUNTER — Telehealth: Payer: Self-pay

## 2021-01-29 NOTE — Telephone Encounter (Signed)
Called pt to schedule Annual Wellness Visit, No answer left message to call [facility] to schedule at [phone]  

## 2021-02-05 DIAGNOSIS — R69 Illness, unspecified: Secondary | ICD-10-CM | POA: Diagnosis not present

## 2021-02-05 DIAGNOSIS — F4310 Post-traumatic stress disorder, unspecified: Secondary | ICD-10-CM | POA: Diagnosis not present

## 2021-02-06 ENCOUNTER — Other Ambulatory Visit: Payer: Self-pay | Admitting: Family Medicine

## 2021-02-06 DIAGNOSIS — I10 Essential (primary) hypertension: Secondary | ICD-10-CM

## 2021-02-06 NOTE — Telephone Encounter (Signed)
Requested medications are due for refill today.  No  Requested medications are on the active medications list.  no  Last refill. 6/232022  Future visit scheduled.   yes  Notes to clinic. Medication d/c'd on 01/19/2021. Dosage was changed to 88 MCG.

## 2021-02-12 DIAGNOSIS — F4310 Post-traumatic stress disorder, unspecified: Secondary | ICD-10-CM | POA: Diagnosis not present

## 2021-02-12 DIAGNOSIS — R69 Illness, unspecified: Secondary | ICD-10-CM | POA: Diagnosis not present

## 2021-02-19 DIAGNOSIS — R69 Illness, unspecified: Secondary | ICD-10-CM | POA: Diagnosis not present

## 2021-02-19 DIAGNOSIS — F4310 Post-traumatic stress disorder, unspecified: Secondary | ICD-10-CM | POA: Diagnosis not present

## 2021-02-22 DIAGNOSIS — N3941 Urge incontinence: Secondary | ICD-10-CM | POA: Diagnosis not present

## 2021-02-22 DIAGNOSIS — R3914 Feeling of incomplete bladder emptying: Secondary | ICD-10-CM | POA: Diagnosis not present

## 2021-02-22 DIAGNOSIS — N3020 Other chronic cystitis without hematuria: Secondary | ICD-10-CM | POA: Diagnosis not present

## 2021-02-22 DIAGNOSIS — R102 Pelvic and perineal pain: Secondary | ICD-10-CM | POA: Diagnosis not present

## 2021-02-27 DIAGNOSIS — F4310 Post-traumatic stress disorder, unspecified: Secondary | ICD-10-CM | POA: Diagnosis not present

## 2021-02-27 DIAGNOSIS — R69 Illness, unspecified: Secondary | ICD-10-CM | POA: Diagnosis not present

## 2021-03-05 ENCOUNTER — Other Ambulatory Visit: Payer: Self-pay | Admitting: Obstetrics and Gynecology

## 2021-03-05 DIAGNOSIS — R69 Illness, unspecified: Secondary | ICD-10-CM | POA: Diagnosis not present

## 2021-03-05 DIAGNOSIS — F4310 Post-traumatic stress disorder, unspecified: Secondary | ICD-10-CM | POA: Diagnosis not present

## 2021-03-05 NOTE — Telephone Encounter (Signed)
Dr.Jertson patient  I called pharmacy to confirm, not refills left.   Annual exam scheduled on 04/25/21

## 2021-03-13 DIAGNOSIS — F4310 Post-traumatic stress disorder, unspecified: Secondary | ICD-10-CM | POA: Diagnosis not present

## 2021-03-13 DIAGNOSIS — R69 Illness, unspecified: Secondary | ICD-10-CM | POA: Diagnosis not present

## 2021-03-15 DIAGNOSIS — M4726 Other spondylosis with radiculopathy, lumbar region: Secondary | ICD-10-CM | POA: Diagnosis not present

## 2021-03-15 DIAGNOSIS — M5416 Radiculopathy, lumbar region: Secondary | ICD-10-CM | POA: Diagnosis not present

## 2021-03-15 DIAGNOSIS — G894 Chronic pain syndrome: Secondary | ICD-10-CM | POA: Diagnosis not present

## 2021-03-15 DIAGNOSIS — Z79891 Long term (current) use of opiate analgesic: Secondary | ICD-10-CM | POA: Diagnosis not present

## 2021-03-16 ENCOUNTER — Other Ambulatory Visit: Payer: Self-pay | Admitting: Obstetrics and Gynecology

## 2021-03-16 DIAGNOSIS — Z780 Asymptomatic menopausal state: Secondary | ICD-10-CM

## 2021-03-19 DIAGNOSIS — R69 Illness, unspecified: Secondary | ICD-10-CM | POA: Diagnosis not present

## 2021-03-19 DIAGNOSIS — F4310 Post-traumatic stress disorder, unspecified: Secondary | ICD-10-CM | POA: Diagnosis not present

## 2021-03-19 NOTE — Telephone Encounter (Signed)
04/19/20 Last AEX 04/25/21 Scheduled  Mammo 09/07/20 Annual screening recommended.

## 2021-03-26 DIAGNOSIS — R69 Illness, unspecified: Secondary | ICD-10-CM | POA: Diagnosis not present

## 2021-03-26 DIAGNOSIS — F4310 Post-traumatic stress disorder, unspecified: Secondary | ICD-10-CM | POA: Diagnosis not present

## 2021-03-29 DIAGNOSIS — R69 Illness, unspecified: Secondary | ICD-10-CM | POA: Diagnosis not present

## 2021-03-29 DIAGNOSIS — F4310 Post-traumatic stress disorder, unspecified: Secondary | ICD-10-CM | POA: Diagnosis not present

## 2021-04-02 DIAGNOSIS — R69 Illness, unspecified: Secondary | ICD-10-CM | POA: Diagnosis not present

## 2021-04-02 DIAGNOSIS — F4310 Post-traumatic stress disorder, unspecified: Secondary | ICD-10-CM | POA: Diagnosis not present

## 2021-04-03 ENCOUNTER — Telehealth: Payer: Self-pay

## 2021-04-03 NOTE — Telephone Encounter (Signed)
Andrea [name] with [facility] medical records called. She said our office had forwarded records to them. She said it had been awhile since this patient had seen them and she was not clear if we were referring her back or just sending records as an FYI to her provider.  I called back and left message that I do not see anything in her chart that would indicate we sent records. Patient has not been seen in almost a year and last labs were right at a year ago.  I asked her to call back if she can provide me more info I am happy to assist her.

## 2021-04-03 NOTE — Telephone Encounter (Signed)
[facility] left voice mail requesting records to be sent over from referral that was placed in Feb. Pt recently had appointment in October with [facility]. Notes faxed encounter closed

## 2021-04-04 ENCOUNTER — Ambulatory Visit: Payer: Medicare HMO | Admitting: Family Medicine

## 2021-04-09 ENCOUNTER — Other Ambulatory Visit: Payer: Self-pay | Admitting: Family Medicine

## 2021-04-09 DIAGNOSIS — R69 Illness, unspecified: Secondary | ICD-10-CM | POA: Diagnosis not present

## 2021-04-09 DIAGNOSIS — F4310 Post-traumatic stress disorder, unspecified: Secondary | ICD-10-CM | POA: Diagnosis not present

## 2021-04-09 NOTE — Telephone Encounter (Signed)
Last TSH was Sept 2022, it was abnormal.  Has upcoming appt 05/18/2021 with [place].  Requested Prescriptions  Pending Prescriptions Disp Refills  . SYNTHROID 88 MCG tablet [Pharmacy Med Name: SYNTHROID 88 MCG TABLET] 90 tablet 1    Sig: TAKE 1 TABLET BY MOUTH DAILY BEFORE BREAKFAST.     Endocrinology:  Hypothyroid Agents Failed - 04/09/2021  8:37 AM      Failed - TSH needs to be rechecked within 3 months after an abnormal result. Refill until TSH is due.      Failed - TSH in normal range and within 360 days    TSH  Date Value Ref Range Status  01/19/2021 0.270 (L) 0.450 - 4.500 uIU/mL Final         Passed - Valid encounter within last 12 months    Recent Outpatient Visits          8 months ago Hypothyroidism due to acquired atrophy of thyroid   [place], Enobong, MD   10 months ago Acquired hypothyroidism   [place], Enobong, MD   1 year ago Recurrent herpes labialis   [place], MD   1 year ago Prediabetes   [place], Enobong, MD   9 years ago Foot pain   [facility], Janalee Dane, MD      Future Appointments            In 1 month Charlott Rakes, MD [place]

## 2021-04-11 ENCOUNTER — Telehealth: Payer: Self-pay | Admitting: Family Medicine

## 2021-04-11 NOTE — Telephone Encounter (Signed)
Called patient and left vm to call [phone] appointment on 12/30 would need to be rescheduled. Dr. Margarita Rana will not be available

## 2021-04-16 DIAGNOSIS — F4310 Post-traumatic stress disorder, unspecified: Secondary | ICD-10-CM | POA: Diagnosis not present

## 2021-04-16 DIAGNOSIS — R69 Illness, unspecified: Secondary | ICD-10-CM | POA: Diagnosis not present

## 2021-04-19 ENCOUNTER — Telehealth: Payer: Self-pay | Admitting: Family Medicine

## 2021-04-19 NOTE — Telephone Encounter (Signed)
Copied from [place] [id number]. Topic: General - Inquiry >> Apr 19, 2021  9:16 AM Robina Ade, Helene Kelp D wrote: Reason for CRM: Patient called and would like to talk to Dr. Margarita Rana or her CMA about getting labs for her thyroids and other labs for her med refill because her appt is not until 06/26/21. Please call patient back, thanks.

## 2021-04-23 DIAGNOSIS — Z6831 Body mass index (BMI) 31.0-31.9, adult: Secondary | ICD-10-CM | POA: Diagnosis not present

## 2021-04-23 DIAGNOSIS — R030 Elevated blood-pressure reading, without diagnosis of hypertension: Secondary | ICD-10-CM | POA: Diagnosis not present

## 2021-04-23 DIAGNOSIS — E669 Obesity, unspecified: Secondary | ICD-10-CM | POA: Diagnosis not present

## 2021-04-23 DIAGNOSIS — Z202 Contact with and (suspected) exposure to infections with a predominantly sexual mode of transmission: Secondary | ICD-10-CM | POA: Diagnosis not present

## 2021-04-23 DIAGNOSIS — R0789 Other chest pain: Secondary | ICD-10-CM | POA: Diagnosis not present

## 2021-04-23 DIAGNOSIS — R69 Illness, unspecified: Secondary | ICD-10-CM | POA: Diagnosis not present

## 2021-04-23 NOTE — Progress Notes (Signed)
45 y.o. G7P2001 Divorced White or Caucasian Not Hispanic or Latino female here for annual exam.  H/O hysterectomy.   H/O PE on OCP's, Protein S def, hysterectomy/bso at 57. On gabapentin for vasomotor symptoms, she is taking 300 mg at night, 100 mg in the am.  She is having some flank pain.   She is still using the estrace. She is sexually active, new partner in the last few months. She thinks she may be in love with him. Pain in certain sexual positions. Using a lubricant.   She is having some vaginal irritation. In the past she used boric acid intermittently for an odor and it helped. She is asking for a refill.    43 year old son committed suicide in 1/20. She struggles with depression, she developed trauma induced psychosis. Overall she is doing better, the holidays are hard for her.   Patient's last menstrual period was 03/19/2011.          Sexually active:  yes  The current method of family planning is status post hysterectomy.    Exercising: No.  The patient does not participate in regular exercise at present. Smoker:  no  Health Maintenance: Pap: 04/12/19 WNL, negative hpv; 02/15/2015 WNL History of abnormal Pap: Yes, cryosurgery in 2005.  MMG:  09/07/20 Bi-rads 1 neg  BMD:   none  Colonoscopy: 12/20/14 normal  TDaP:  01/07/14  Gardasil: n/a   reports that she quit smoking about 17 years ago. Her smoking use included cigarettes and e-cigarettes. She has a 15.00 pack-year smoking history. She has never used smokeless tobacco. She reports that she does not drink alcohol and does not use drugs. She is on disability, lives with her Dad. Difficult relationship with her 84 year old son.     Past Medical History:  Diagnosis Date   Anxiety    Arthritis    ruptured lumbar disc-careful with positioning   Blood dyscrasia    protein s deficiency-no treatment since 2006   Depression    Diabetes mellitus without complication ([place])    GERD (gastroesophageal reflux disease)     Headache(784.0)    Hemorrhoids    High cholesterol    History of fainting spells of unknown cause    History of pulmonary embolism    Hyperlipemia    Hypertension    Hypothyroidism    IBS (irritable bowel syndrome)    MVP (mitral valve prolapse)    echo per dr Dagmar Hait   Protein S deficiency ([place]) 2003   dvt/pulmonary embolus-on coumadin for 6 months then off-Sees [facility]   PVC (premature ventricular contraction)     Past Surgical History:  Procedure Laterality Date   BREAST BIOPSY Right 05/27/2018   CESAREAN SECTION  2006   CHOLECYSTECTOMY N/A 11/03/2014   Procedure: LAPAROSCOPIC CHOLECYSTECTOMY WITH INTRAOPERATIVE CHOLANGIOGRAM;  Surgeon: Georganna Skeans, MD;  Location: [facility];  Service: General;  Laterality: N/A;   GYNECOLOGIC CRYOSURGERY     LAPAROSCOPIC ASSISTED VAGINAL HYSTERECTOMY  03/27/2011   Procedure: LAPAROSCOPIC ASSISTED VAGINAL HYSTERECTOMY;  Surgeon: Cyril Mourning, MD;  Location: [facility];  Service: Gynecology;  Laterality: N/A;   SALPINGOOPHORECTOMY  03/27/2011   Procedure: SALPINGO OOPHERECTOMY;  Surgeon: Cyril Mourning, MD;  Location: [facility];  Service: Gynecology;  Laterality: Bilateral;   TONSILLECTOMY  89   VAGINAL RECONSTRUCTIVE SURGERY  2001    Current Outpatient Medications  Medication Sig Dispense Refill   ALPRAZolam (XANAX XR) 2 MG 24 hr tablet Take 1 tablet (2 mg total)  by mouth daily. For anxiety 5 tablet 0   ALPRAZolam (XANAX) 1 MG tablet Take 1 mg by mouth at bedtime as needed for anxiety.     ARIPiprazole (ABILIFY) 10 MG tablet 1 tablet     aspirin EC 81 MG tablet Take 81 mg by mouth daily.     colestipol (COLESTID) 1 g tablet Take two tablets by mouth twice a day 120 tablet 1   cycloSPORINE (RESTASIS) 0.05 % ophthalmic emulsion Place 1 drop into both eyes 2 (two) times daily.     doxepin (SINEQUAN) 10 MG capsule Take 10 mg by mouth at bedtime.     ezetimibe (ZETIA) 10 MG tablet TAKE 1 TABLET BY MOUTH EVERY DAY 90 tablet 3   FLUoxetine  (PROZAC) 40 MG capsule Take 1 capsule (40 mg total) by mouth daily. (Patient taking differently: Take 40 mg by mouth 2 (two) times daily.) 30 capsule 0   fluticasone (FLONASE) 50 MCG/ACT nasal spray Place 2 sprays into both nostrils daily. 16 g 6   metoprolol tartrate (LOPRESSOR) 25 MG tablet TAKE 1 TABLET BY MOUTH TWICE A DAY 180 tablet 0   ondansetron (ZOFRAN) 4 MG tablet TAKE 1 TABLET BY MOUTH EVERY 8 HOURS AS NEEDED FOR NAUSEA AND VOMITING 20 tablet 0   prazosin (MINIPRESS) 2 MG capsule Take by mouth 2 (two) times daily.      QUEtiapine (SEROQUEL) 400 MG tablet Take 400 mg by mouth at bedtime.     rosuvastatin (CRESTOR) 20 MG tablet TAKE 1 TABLET BY MOUTH EVERY DAY 30 tablet 0   sulfamethoxazole-trimethoprim (BACTRIM DS) 800-160 MG tablet Take 1 tablet by mouth 2 (two) times daily. One PO BID x 3 days 6 tablet 0   SYNTHROID 88 MCG tablet TAKE 1 TABLET BY MOUTH DAILY BEFORE BREAKFAST. 90 tablet 0   traZODone (DESYREL) 100 MG tablet 300 mg.     valACYclovir (VALTREX) 1000 MG tablet Take 2 tablets po q 12 hours x 2 doses as needed. 30 tablet 1   ESTRACE VAGINAL 0.1 MG/GM vaginal cream PLACE 1 GRAM VAGINALLY 2 X A WEEK AT BEDTIME 42.5 g 1   gabapentin (NEURONTIN) 100 MG capsule TAKE 1 CAPSULE EVERY MORNING TAKE IN ADDITION TO THE AT BEDTIME DOSE 90 capsule 3   gabapentin (NEURONTIN) 300 MG capsule TAKE 1 CAPSULE BY MOUTH EVERYDAY AT BEDTIME 90 capsule 3   oxyCODONE-acetaminophen (PERCOCET) 7.5-325 MG tablet Take 1 tablet by mouth 4 (four) times daily as needed. (Patient not taking: Reported on 04/25/2021)     oxyCODONE-acetaminophen (PERCOCET/ROXICET) 5-325 MG tablet Take 1 tablet by mouth every 6 (six) hours as needed for severe pain. (Patient not taking: Reported on 04/25/2021)     No current facility-administered medications for this visit.    Family History  Problem Relation Age of Onset   High blood pressure Mother    Anxiety disorder Mother    Depression Mother    OCD Mother    Physical  abuse Mother    Alcohol abuse Mother    Hyperlipidemia Father    High blood pressure Father    Hypertension Father    Cancer Father        skin   ADD / ADHD Son    Seizures Son    ADD / ADHD Son    Hypertension Other        entire family on both sides   Asthma Son    Asthma Son    Clotting disorder Maternal Uncle  Clotting disorder Paternal Grandmother    Heart disease Maternal Grandfather    Alcohol abuse Maternal Grandfather    Anxiety disorder Maternal Grandmother     Review of Systems  Genitourinary:  Positive for flank pain.   Exam:   BP 120/80   Pulse 88   Ht 5\' 9"  (1.753 m)   Wt 204 lb (92.5 kg)   LMP 03/19/2011   SpO2 99%   BMI 30.13 kg/m   Weight change: @WEIGHTCHANGE @ Height:   Height: 5\' 9"  (175.3 cm)  Ht Readings from Last 3 Encounters:  04/25/21 5\' 9"  (1.753 m)  08/10/20 5\' 9"  (1.753 m)  07/25/20 5\' 9"  (1.753 m)    General appearance: alert, cooperative and appears stated age Head: Normocephalic, without obvious abnormality, atraumatic Neck: no adenopathy, supple, symmetrical, trachea midline and thyroid normal to inspection and palpation Lungs: clear to auscultation bilaterally Cardiovascular: regular rate and rhythm Breasts: normal appearance, no masses or tenderness Abdomen: soft, non-tender; non distended,  no masses,  no organomegaly Extremities: extremities normal, atraumatic, no cyanosis or edema Skin: Skin color, texture, turgor normal. No rashes or lesions Lymph nodes: Cervical, supraclavicular, and axillary nodes normal. No abnormal inguinal nodes palpated Neurologic: Grossly normal   Pelvic: External genitalia:  no lesions              Urethra:  normal appearing urethra with no masses, tenderness or lesions              Bartholins and Skenes: normal                 Vagina: atrophic appearing vagina with normal color, no discharge, no lesions              Cervix: absent               Bimanual Exam:  Uterus:  uterus absent               Adnexa: no mass, fullness, tenderness               Rectovaginal: Confirms               Anus:  normal sphincter tone, no lesions Bladder: mildly tender Pelvic floor: tender bilaterally  Gae Dry chaperoned for the exam.  1. Well woman exam Discussed breast self exam Discussed calcium and vit D intake No pap this year Mammogram due in 4/23 Colonoscopy UTD  2. Vaginal atrophy Helped with estrogen - ESTRACE VAGINAL 0.1 MG/GM vaginal cream; PLACE 1 GRAM VAGINALLY 2 X A WEEK AT BEDTIME  Dispense: 42.5 g; Refill: 1  3. Vasomotor symptoms due to menopause Controlled with gabapentin - gabapentin (NEURONTIN) 300 MG capsule; TAKE 1 CAPSULE BY MOUTH EVERYDAY AT BEDTIME  Dispense: 90 capsule; Refill: 3 - gabapentin (NEURONTIN) 100 MG capsule; TAKE 1 CAPSULE EVERY MORNING TAKE IN ADDITION TO THE AT BEDTIME DOSE  Dispense: 90 capsule; Refill: 3  4. Flank pain - Urinalysis,Complete w/RFL Culture: negative -She should f/u with her primary  5. Vaginal irritation - WET PREP FOR TRICH, YEAST, CLUE -She requested vaginal boric acid. Instead, I recommended that she try  replens for vaginal odor -Return with further symptoms  6. Screening examination for STD (sexually transmitted disease) - SURESWAB CT/NG/T. vaginalis -Declines blood work  7. Dyspareunia, female Pelvic floor is tender Discussed lubrication, she should control rate and depth of penetration  8. Pelvic floor dysfunction Recommend pelvic floor PT. She is starting pelvic floor PT, Urologist referred her. She will  discuss her dyspareunia with PT.   9. History of depression She is doing better overall, struggles around the holidays and around the anniversary of her sons death. Has support.  In addition to the breast and pelvic exam, 30 minutes was spent in total patient care counseling about her multiple medical issues.

## 2021-04-23 NOTE — Telephone Encounter (Signed)
Called patient and left VM for patient to return call and schedule an appointment to get blood work done

## 2021-04-24 ENCOUNTER — Ambulatory Visit: Payer: Self-pay

## 2021-04-24 NOTE — Telephone Encounter (Signed)
Pt c/o moderate fatigue and occasional dizziness for over a month. Pt thinks it could be a thyroid issue. Pt stated she wants appt earlier than February. Noted no appts earlier. Care advice given and pt verbalized understanding.Pt wants to see Dr. Margarita Rana only.        Reason for Disposition  [1] MODERATE weakness (i.e., interferes with work, school, normal activities) AND [2] persists > 3 days  Answer Assessment - Initial Assessment Questions 1. DESCRIPTION: "Describe how you are feeling."     fatigue 2. SEVERITY: "How bad is it?"  "Can you stand and walk?"   - MILD - Feels weak or tired, but does not interfere with work, school or normal activities   - Kathleen to stand and walk; weakness interferes with work, school, or normal activities   - SEVERE - Unable to stand or walk     moderate 3. ONSET:  "When did the weakness begin?"     Over a month 4. CAUSE: "What do you think is causing the weakness?"     Thyroid issue 5. MEDICINES: "Have you recently started a new medicine or had a change in the amount of a medicine?"     Changing doses-- 6. OTHER SYMPTOMS: "Do you have any other symptoms?" (e.g., chest pain, fever, cough, SOB, vomiting, diarrhea, bleeding, other areas of pain)     Palpitations,occasional SOB 7. PREGNANCY: "Is there any chance you are pregnant?" "When was your last menstrual period?"     N/a  Protocols used: Weakness (Generalized) and Fatigue-A-AH

## 2021-04-24 NOTE — Telephone Encounter (Signed)
Called pt unable to reach left VM to call back

## 2021-04-25 ENCOUNTER — Ambulatory Visit (INDEPENDENT_AMBULATORY_CARE_PROVIDER_SITE_OTHER): Payer: Medicare HMO | Admitting: Obstetrics and Gynecology

## 2021-04-25 ENCOUNTER — Encounter: Payer: Self-pay | Admitting: Obstetrics and Gynecology

## 2021-04-25 ENCOUNTER — Other Ambulatory Visit: Payer: Self-pay

## 2021-04-25 VITALS — BP 120/80 | HR 88 | Ht 69.0 in | Wt 204.0 lb

## 2021-04-25 DIAGNOSIS — N898 Other specified noninflammatory disorders of vagina: Secondary | ICD-10-CM

## 2021-04-25 DIAGNOSIS — N941 Unspecified dyspareunia: Secondary | ICD-10-CM | POA: Diagnosis not present

## 2021-04-25 DIAGNOSIS — B009 Herpesviral infection, unspecified: Secondary | ICD-10-CM | POA: Diagnosis not present

## 2021-04-25 DIAGNOSIS — N951 Menopausal and female climacteric states: Secondary | ICD-10-CM

## 2021-04-25 DIAGNOSIS — R10A0 Flank pain, unspecified side: Secondary | ICD-10-CM

## 2021-04-25 DIAGNOSIS — N952 Postmenopausal atrophic vaginitis: Secondary | ICD-10-CM | POA: Diagnosis not present

## 2021-04-25 DIAGNOSIS — Z9189 Other specified personal risk factors, not elsewhere classified: Secondary | ICD-10-CM | POA: Diagnosis not present

## 2021-04-25 DIAGNOSIS — R69 Illness, unspecified: Secondary | ICD-10-CM | POA: Diagnosis not present

## 2021-04-25 DIAGNOSIS — M6289 Other specified disorders of muscle: Secondary | ICD-10-CM

## 2021-04-25 DIAGNOSIS — Z01419 Encounter for gynecological examination (general) (routine) without abnormal findings: Secondary | ICD-10-CM

## 2021-04-25 DIAGNOSIS — Z8659 Personal history of other mental and behavioral disorders: Secondary | ICD-10-CM

## 2021-04-25 DIAGNOSIS — R109 Unspecified abdominal pain: Secondary | ICD-10-CM

## 2021-04-25 DIAGNOSIS — Z01411 Encounter for gynecological examination (general) (routine) with abnormal findings: Secondary | ICD-10-CM | POA: Diagnosis not present

## 2021-04-25 DIAGNOSIS — Z113 Encounter for screening for infections with a predominantly sexual mode of transmission: Secondary | ICD-10-CM | POA: Diagnosis not present

## 2021-04-25 LAB — URINALYSIS, COMPLETE W/RFL CULTURE
Bacteria, UA: NONE SEEN /HPF
Bilirubin Urine: NEGATIVE
Glucose, UA: NEGATIVE
Hgb urine dipstick: NEGATIVE
Hyaline Cast: NONE SEEN /LPF
Ketones, ur: NEGATIVE
Leukocyte Esterase: NEGATIVE
Nitrites, Initial: NEGATIVE
Protein, ur: NEGATIVE
RBC / HPF: NONE SEEN /HPF (ref 0–2)
Specific Gravity, Urine: 1.025 (ref 1.001–1.035)
WBC, UA: NONE SEEN /HPF (ref 0–5)
pH: 5.5 (ref 5.0–8.0)

## 2021-04-25 LAB — NO CULTURE INDICATED

## 2021-04-25 LAB — WET PREP FOR TRICH, YEAST, CLUE

## 2021-04-25 MED ORDER — ESTRACE 0.1 MG/GM VA CREA: TOPICAL_CREAM | VAGINAL | 1 refills | Status: DC

## 2021-04-25 MED ORDER — GABAPENTIN 300 MG PO CAPS: ORAL_CAPSULE | ORAL | 3 refills | Status: AC

## 2021-04-25 MED ORDER — GABAPENTIN 100 MG PO CAPS: ORAL_CAPSULE | ORAL | 3 refills | Status: AC

## 2021-04-25 NOTE — Patient Instructions (Addendum)
Try uberlube for vaginal lubrication.  EXERCISE   We recommended that you start or continue a regular exercise program for good health. Physical activity is anything that gets your body moving, some is better than none. The CDC recommends 150 minutes per week of Moderate-Intensity Aerobic Activity and 2 or more days of Muscle Strengthening Activity.  Benefits of exercise are limitless: helps weight loss/weight maintenance, improves mood and energy, helps with depression and anxiety, improves sleep, tones and strengthens muscles, improves balance, improves bone density, protects from chronic conditions such as heart disease, high blood pressure and diabetes and so much more. To learn more visit: https://www.cdc.gov/physicalactivity/index.html  DIET: Good nutrition starts with a healthy diet of fruits, vegetables, whole grains, and lean protein sources. Drink plenty of water for hydration. Minimize empty calories, sodium, sweets. For more information about dietary recommendations visit: https://health.gov/our-work/nutrition-physical-activity/dietary-guidelines and https://www.myplate.gov/  ALCOHOL:  Women should limit their alcohol intake to no more than 7 drinks/beers/glasses of wine (combined, not each!) per week. Moderation of alcohol intake to this level decreases your risk of breast cancer and liver damage.  If you are concerned that you may have a problem, or your friends have told you they are concerned about your drinking, there are many resources to help. A well-known program that is free, effective, and available to all people all over the nation is Alcoholics Anonymous.  Check out this site to learn more: https://www.aa.org/   CALCIUM AND VITAMIN D:  Adequate intake of calcium and Vitamin D are recommended for bone health.  You should be getting between 1000-1200 mg of calcium and 800 units of Vitamin D daily between diet and supplements  PAP SMEARS:  Pap smears, to check for cervical cancer  or precancers,  have traditionally been done yearly, scientific advances have shown that most women can have pap smears less often.  However, every woman still should have a physical exam from her gynecologist every year. It will include a breast check, inspection of the vulva and vagina to check for abnormal growths or skin changes, a visual exam of the cervix, and then an exam to evaluate the size and shape of the uterus and ovaries. We will also provide age appropriate advice regarding health maintenance, like when you should have certain vaccines, screening for sexually transmitted diseases, bone density testing, colonoscopy, mammograms, etc.   MAMMOGRAMS:  All women over 40 years old should have a routine mammogram.   COLON CANCER SCREENING: Now recommend starting at age 45. At this time colonoscopy is not covered for routine screening until 50. There are take home tests that can be done between 45-49.   COLONOSCOPY:  Colonoscopy to screen for colon cancer is recommended for all women at age 50.  We know, you hate the idea of the prep.  We agree, BUT, having colon cancer and not knowing it is worse!!  Colon cancer so often starts as a polyp that can be seen and removed at colonscopy, which can quite literally save your life!  And if your first colonoscopy is normal and you have no family history of colon cancer, most women don't have to have it again for 10 years.  Once every ten years, you can do something that may end up saving your life, right?  We will be happy to help you get it scheduled when you are ready.  Be sure to check your insurance coverage so you understand how much it will cost.  It may be covered as a preventative service at   no cost, but you should check your particular policy.      Breast Self-Awareness Breast self-awareness means being familiar with how your breasts look and feel. It involves checking your breasts regularly and reporting any changes to your health care  provider. Practicing breast self-awareness is important. A change in your breasts can be a sign of a serious medical problem. Being familiar with how your breasts look and feel allows you to find any problems early, when treatment is more likely to be successful. All women should practice breast self-awareness, including women who have had breast implants. How to do a breast self-exam One way to learn what is normal for your breasts and whether your breasts are changing is to do a breast self-exam. To do a breast self-exam: Look for Changes  Remove all the clothing above your waist. Stand in front of a mirror in a room with good lighting. Put your hands on your hips. Push your hands firmly downward. Compare your breasts in the mirror. Look for differences between them (asymmetry), such as: Differences in shape. Differences in size. Puckers, dips, and bumps in one breast and not the other. Look at each breast for changes in your skin, such as: Redness. Scaly areas. Look for changes in your nipples, such as: Discharge. Bleeding. Dimpling. Redness. A change in position. Feel for Changes Carefully feel your breasts for lumps and changes. It is best to do this while lying on your back on the floor and again while sitting or standing in the shower or tub with soapy water on your skin. Feel each breast in the following way: Place the arm on the side of the breast you are examining above your head. Feel your breast with the other hand. Start in the nipple area and make  inch (2 cm) overlapping circles to feel your breast. Use the pads of your three middle fingers to do this. Apply light pressure, then medium pressure, then firm pressure. The light pressure will allow you to feel the tissue closest to the skin. The medium pressure will allow you to feel the tissue that is a little deeper. The firm pressure will allow you to feel the tissue close to the ribs. Continue the overlapping circles,  moving downward over the breast until you feel your ribs below your breast. Move one finger-width toward the center of the body. Continue to use the  inch (2 cm) overlapping circles to feel your breast as you move slowly up toward your collarbone. Continue the up and down exam using all three pressures until you reach your armpit.  Write Down What You Find  Write down what is normal for each breast and any changes that you find. Keep a written record with breast changes or normal findings for each breast. By writing this information down, you do not need to depend only on memory for size, tenderness, or location. Write down where you are in your menstrual cycle, if you are still menstruating. If you are having trouble noticing differences in your breasts, do not get discouraged. With time you will become more familiar with the variations in your breasts and more comfortable with the exam. How often should I examine my breasts? Examine your breasts every month. If you are breastfeeding, the best time to examine your breasts is after a feeding or after using a breast pump. If you menstruate, the best time to examine your breasts is 5-7 days after your period is over. During your period, your breasts are   lumpier, and it may be more difficult to notice changes. When should I see my health care provider? See your health care provider if you notice: A change in shape or size of your breasts or nipples. A change in the skin of your breast or nipples, such as a reddened or scaly area. Unusual discharge from your nipples. A lump or thick area that was not there before. Pain in your breasts. Anything that concerns you.. 

## 2021-04-26 DIAGNOSIS — R69 Illness, unspecified: Secondary | ICD-10-CM | POA: Diagnosis not present

## 2021-04-26 DIAGNOSIS — F4310 Post-traumatic stress disorder, unspecified: Secondary | ICD-10-CM | POA: Diagnosis not present

## 2021-04-26 LAB — SURESWAB CT/NG/T. VAGINALIS
C. trachomatis RNA, TMA: NOT DETECTED
N. gonorrhoeae RNA, TMA: NOT DETECTED
Trichomonas vaginalis RNA: NOT DETECTED

## 2021-04-30 DIAGNOSIS — F4310 Post-traumatic stress disorder, unspecified: Secondary | ICD-10-CM | POA: Diagnosis not present

## 2021-04-30 DIAGNOSIS — R69 Illness, unspecified: Secondary | ICD-10-CM | POA: Diagnosis not present

## 2021-05-05 ENCOUNTER — Other Ambulatory Visit: Payer: Self-pay | Admitting: Family Medicine

## 2021-05-05 DIAGNOSIS — I10 Essential (primary) hypertension: Secondary | ICD-10-CM

## 2021-05-06 NOTE — Telephone Encounter (Signed)
Requested Prescriptions  Pending Prescriptions Disp Refills   metoprolol tartrate (LOPRESSOR) 25 MG tablet [Pharmacy Med Name: METOPROLOL TARTRATE 25 MG TAB] 180 tablet 0    Sig: TAKE 1 TABLET BY MOUTH TWICE A DAY     Cardiovascular:  Beta Blockers Failed - 05/05/2021  9:14 AM      Failed - Valid encounter within last 6 months    Recent Outpatient Visits          8 months ago Hypothyroidism due to acquired atrophy of thyroid   [place], Enobong, MD   11 months ago Acquired hypothyroidism   [place], Enobong, MD   1 year ago Recurrent herpes labialis   [place], Enobong, MD   1 year ago Prediabetes   [place], Enobong, MD   9 years ago Foot pain   [facility], Janalee Dane, MD      Future Appointments            In 1 month Charlott Rakes, MD [place] BP in normal range    BP Readings from Last 1 Encounters:  04/25/21 120/80         Passed - Last Heart Rate in normal range    Pulse Readings from Last 1 Encounters:  04/25/21 88

## 2021-05-07 ENCOUNTER — Encounter: Payer: Self-pay | Admitting: Family Medicine

## 2021-05-07 NOTE — Telephone Encounter (Signed)
Please advise patient.  

## 2021-05-08 ENCOUNTER — Other Ambulatory Visit: Payer: Self-pay | Admitting: Family Medicine

## 2021-05-08 DIAGNOSIS — E034 Atrophy of thyroid (acquired): Secondary | ICD-10-CM

## 2021-05-08 DIAGNOSIS — H16143 Punctate keratitis, bilateral: Secondary | ICD-10-CM | POA: Diagnosis not present

## 2021-05-10 ENCOUNTER — Other Ambulatory Visit: Payer: Self-pay

## 2021-05-10 ENCOUNTER — Ambulatory Visit: Payer: Medicare HMO | Attending: Family Medicine

## 2021-05-10 DIAGNOSIS — E034 Atrophy of thyroid (acquired): Secondary | ICD-10-CM

## 2021-05-11 DIAGNOSIS — F4310 Post-traumatic stress disorder, unspecified: Secondary | ICD-10-CM | POA: Diagnosis not present

## 2021-05-11 DIAGNOSIS — R69 Illness, unspecified: Secondary | ICD-10-CM | POA: Diagnosis not present

## 2021-05-11 LAB — LP+NON-HDL CHOLESTEROL
Cholesterol, Total: 193 mg/dL (ref 100–199)
HDL: 52 mg/dL (ref >39)
LDL Chol Calc (NIH): 117 mg/dL — ABNORMAL HIGH (ref 0–99)
Total Non-HDL-Chol (LDL+VLDL): 141 mg/dL — ABNORMAL HIGH (ref 0–129)
Triglycerides: 133 mg/dL (ref 0–149)
VLDL Cholesterol Cal: 24 mg/dL (ref 5–40)

## 2021-05-11 LAB — CMP14+EGFR
ALT: 17 IU/L (ref 0–32)
AST: 19 IU/L (ref 0–40)
Albumin/Globulin Ratio: 2.5 — ABNORMAL HIGH (ref 1.2–2.2)
Albumin: 4.5 g/dL (ref 3.8–4.8)
Alkaline Phosphatase: 77 IU/L (ref 44–121)
BUN/Creatinine Ratio: 12 (ref 9–23)
BUN: 12 mg/dL (ref 6–24)
Bilirubin Total: 0.6 mg/dL (ref 0.0–1.2)
CO2: 25 mmol/L (ref 20–29)
Calcium: 9.5 mg/dL (ref 8.7–10.2)
Chloride: 100 mmol/L (ref 96–106)
Creatinine, Ser: 1 mg/dL (ref 0.57–1.00)
Globulin, Total: 1.8 g/dL (ref 1.5–4.5)
Glucose: 119 mg/dL — ABNORMAL HIGH (ref 70–99)
Potassium: 4.1 mmol/L (ref 3.5–5.2)
Sodium: 139 mmol/L (ref 134–144)
Total Protein: 6.3 g/dL (ref 6.0–8.5)
eGFR: 70 mL/min/{1.73_m2} (ref >59)

## 2021-05-11 LAB — TSH: TSH: 2.39 u[IU]/mL (ref 0.450–4.500)

## 2021-05-11 LAB — T4, FREE: Free T4: 1.46 ng/dL (ref 0.82–1.77)

## 2021-05-16 ENCOUNTER — Encounter: Payer: Self-pay | Admitting: Family Medicine

## 2021-05-17 DIAGNOSIS — F4310 Post-traumatic stress disorder, unspecified: Secondary | ICD-10-CM | POA: Diagnosis not present

## 2021-05-17 DIAGNOSIS — R69 Illness, unspecified: Secondary | ICD-10-CM | POA: Diagnosis not present

## 2021-05-18 ENCOUNTER — Ambulatory Visit: Payer: Medicare HMO | Admitting: Family Medicine

## 2021-05-29 DIAGNOSIS — R69 Illness, unspecified: Secondary | ICD-10-CM | POA: Diagnosis not present

## 2021-05-29 DIAGNOSIS — F4310 Post-traumatic stress disorder, unspecified: Secondary | ICD-10-CM | POA: Diagnosis not present

## 2021-06-06 ENCOUNTER — Encounter: Payer: Self-pay | Admitting: Obstetrics and Gynecology

## 2021-06-06 ENCOUNTER — Other Ambulatory Visit: Payer: Self-pay

## 2021-06-06 ENCOUNTER — Encounter: Payer: Self-pay | Admitting: Family Medicine

## 2021-06-06 ENCOUNTER — Ambulatory Visit (INDEPENDENT_AMBULATORY_CARE_PROVIDER_SITE_OTHER): Payer: Medicare HMO | Admitting: Obstetrics and Gynecology

## 2021-06-06 VITALS — BP 122/84 | HR 72 | Ht 69.0 in | Wt 200.0 lb

## 2021-06-06 DIAGNOSIS — K5900 Constipation, unspecified: Secondary | ICD-10-CM

## 2021-06-06 DIAGNOSIS — R200 Anesthesia of skin: Secondary | ICD-10-CM

## 2021-06-06 NOTE — Patient Instructions (Addendum)
Try taking metamucil or magnesium (500 mg a day) for constipation.   About Constipation  Constipation Overview Constipation is the most common gastrointestinal complaint -- about 4 million Americans experience constipation and make 2.5 million physician visits a year to get help for the problem.  Constipation can occur when the colon absorbs too much water, the colons muscle contraction is slow or sluggish, and/or there is delayed transit time through the colon.  The result is stool that is hard and dry.  Indicators of constipation include straining during bowel movements greater than 25% of the time, having fewer than three bowel movements per week, and/or the feeling of incomplete evacuation.  There are established guidelines ([place] II ) for defining constipation. A person needs to have two or more of the following symptoms for at least 12 weeks (not necessarily consecutive) in the preceding 12 months: Straining in  greater than 25% of bowel movements Lumpy or hard stools in greater than 25% of bowel movements Sensation of incomplete emptying in greater than 25% of bowel movements Sensation of anorectal obstruction/blockade in greater than 25% of bowel movements Manual maneuvers to help empty greater than 25% of bowel movements (e.g., digital evacuation, support of the pelvic floor)  Less than  3 bowel movements/week Loose stools are not present, and criteria for irritable bowel syndrome are insufficient  Common Causes of Constipation Lack of fiber in your diet Lack of physical activity Medications, including iron and calcium supplements  Dairy intake Dehydration Abuse of laxatives Travel Irritable Bowel Syndrome Pregnancy Luteal phase of menstruation (after ovulation and before menses) Colorectal problems Intestinal Dysfunction  Treating Constipation  There are several ways of treating constipation, including changes to diet and exercise, use of laxatives, adjustments to the pelvic  floor, and scheduled toileting.  These treatments include: increasing fiber and fluids in the diet  increasing physical activity learning muscle coordination  learning proper toileting techniques and toileting modifications  designing and sticking  to a toileting schedule     2007, [employer] Doc.22
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
## 2021-06-07 ENCOUNTER — Encounter: Payer: Self-pay | Admitting: Obstetrics and Gynecology

## 2021-06-11 ENCOUNTER — Ambulatory Visit: Payer: Self-pay | Admitting: *Deleted

## 2021-06-11 DIAGNOSIS — R69 Illness, unspecified: Secondary | ICD-10-CM | POA: Diagnosis not present

## 2021-06-11 DIAGNOSIS — M48061 Spinal stenosis, lumbar region without neurogenic claudication: Secondary | ICD-10-CM | POA: Diagnosis not present

## 2021-06-11 DIAGNOSIS — M5126 Other intervertebral disc displacement, lumbar region: Secondary | ICD-10-CM | POA: Diagnosis not present

## 2021-06-11 DIAGNOSIS — M4699 Unspecified inflammatory spondylopathy, multiple sites in spine: Secondary | ICD-10-CM | POA: Diagnosis not present

## 2021-06-11 DIAGNOSIS — M549 Dorsalgia, unspecified: Secondary | ICD-10-CM | POA: Diagnosis not present

## 2021-06-11 DIAGNOSIS — M4316 Spondylolisthesis, lumbar region: Secondary | ICD-10-CM | POA: Diagnosis not present

## 2021-06-11 DIAGNOSIS — R159 Full incontinence of feces: Secondary | ICD-10-CM | POA: Diagnosis not present

## 2021-06-11 DIAGNOSIS — F4310 Post-traumatic stress disorder, unspecified: Secondary | ICD-10-CM | POA: Diagnosis not present

## 2021-06-11 DIAGNOSIS — R202 Paresthesia of skin: Secondary | ICD-10-CM | POA: Diagnosis not present

## 2021-06-11 NOTE — Telephone Encounter (Signed)
Opened chart to answer question for agent.  Pt is on her way to the [facility] now at the direction of Dr. Augusto Gamble.   Did not need to talk with pt.

## 2021-06-11 NOTE — Telephone Encounter (Signed)
Pt reports [facility] is [facility] and they advised ED. States she cannot miss her Therapy appt and will go to ED after appt. States she will message Dr. Margarita Rana to keep her informed.

## 2021-06-12 ENCOUNTER — Other Ambulatory Visit: Payer: Self-pay | Admitting: Obstetrics and Gynecology

## 2021-06-12 DIAGNOSIS — Z1231 Encounter for screening mammogram for malignant neoplasm of breast: Secondary | ICD-10-CM

## 2021-06-15 DIAGNOSIS — G8929 Other chronic pain: Secondary | ICD-10-CM | POA: Diagnosis not present

## 2021-06-15 DIAGNOSIS — R200 Anesthesia of skin: Secondary | ICD-10-CM | POA: Diagnosis not present

## 2021-06-15 DIAGNOSIS — M545 Low back pain, unspecified: Secondary | ICD-10-CM | POA: Diagnosis not present

## 2021-06-18 DIAGNOSIS — R69 Illness, unspecified: Secondary | ICD-10-CM | POA: Diagnosis not present

## 2021-06-18 DIAGNOSIS — F4310 Post-traumatic stress disorder, unspecified: Secondary | ICD-10-CM | POA: Diagnosis not present

## 2021-06-21 DIAGNOSIS — R351 Nocturia: Secondary | ICD-10-CM | POA: Diagnosis not present

## 2021-06-21 DIAGNOSIS — N3941 Urge incontinence: Secondary | ICD-10-CM | POA: Diagnosis not present

## 2021-06-21 DIAGNOSIS — R3914 Feeling of incomplete bladder emptying: Secondary | ICD-10-CM | POA: Diagnosis not present

## 2021-06-21 DIAGNOSIS — N3020 Other chronic cystitis without hematuria: Secondary | ICD-10-CM | POA: Diagnosis not present

## 2021-06-21 DIAGNOSIS — R102 Pelvic and perineal pain: Secondary | ICD-10-CM | POA: Diagnosis not present

## 2021-06-26 ENCOUNTER — Ambulatory Visit: Payer: Medicare HMO | Admitting: Family Medicine

## 2021-06-26 DIAGNOSIS — F4310 Post-traumatic stress disorder, unspecified: Secondary | ICD-10-CM | POA: Diagnosis not present

## 2021-06-26 DIAGNOSIS — R69 Illness, unspecified: Secondary | ICD-10-CM | POA: Diagnosis not present

## 2021-07-05 DIAGNOSIS — R69 Illness, unspecified: Secondary | ICD-10-CM | POA: Diagnosis not present

## 2021-07-05 DIAGNOSIS — F4310 Post-traumatic stress disorder, unspecified: Secondary | ICD-10-CM | POA: Diagnosis not present

## 2021-07-08 ENCOUNTER — Other Ambulatory Visit: Payer: Self-pay | Admitting: Family Medicine

## 2021-07-08 DIAGNOSIS — E785 Hyperlipidemia, unspecified: Secondary | ICD-10-CM

## 2021-07-09 NOTE — Telephone Encounter (Signed)
Requested medication (s) are due for refill today:   Yes for both  Requested medication (s) are on the active medication list:   Yes for both  Future visit scheduled:   No.   She was a No Show on 06/26/2021 with Dr. Margarita Rana   Last ordered: Zetia 07/25/2020 #90, 3 refills;   Synthroid 04/09/2021 #90, 0 refills  Returned for provider to review for refills since pt missed appt on 2/7.   Requested Prescriptions  Pending Prescriptions Disp Refills   ezetimibe (ZETIA) 10 MG tablet [Pharmacy Med Name: EZETIMIBE 10 MG TABLET] 90 tablet 3    Sig: TAKE 1 TABLET BY MOUTH EVERY DAY     Cardiovascular:  Antilipid - Sterol Transport Inhibitors Failed - 07/08/2021  9:04 AM      Failed - Lipid Panel in normal range within the last 12 months    Cholesterol, Total  Date Value Ref Range Status  05/10/2021 193 100 - 199 mg/dL Final   LDL Cholesterol (Calc)  Date Value Ref Range Status  03/13/2018 172 (H) mg/dL (calc) Final    Comment:    Reference range: <100 . Desirable range <100 mg/dL for primary prevention;   <70 mg/dL for patients with CHD or diabetic patients  with > or = 2 CHD risk factors. Marland Kitchen LDL-C is now calculated using the Martin-Hopkins  calculation, which is a validated novel method providing  better accuracy than the Friedewald equation in the  estimation of LDL-C.  Cresenciano Genre et al. Annamaria Helling. 9622;297(98): 2061-2068  (http://education.[facility].com/faq/FAQ164)    LDL Chol Calc (NIH)  Date Value Ref Range Status  05/10/2021 117 (H) 0 - 99 mg/dL Final   Direct LDL  Date Value Ref Range Status  12/14/2018 124.0 mg/dL Final    Comment:    Optimal:  <100 mg/dLNear or Above Optimal:  100-129 mg/dLBorderline High:  130-159 mg/dLHigh:  160-189 mg/dLVery High:  >190 mg/dL   HDL  Date Value Ref Range Status  05/10/2021 52 >39 mg/dL Final   Triglycerides  Date Value Ref Range Status  05/10/2021 133 0 - 149 mg/dL Final         Passed - AST in normal range and within 360 days     AST  Date Value Ref Range Status  05/10/2021 19 0 - 40 IU/L Final          Passed - ALT in normal range and within 360 days    ALT  Date Value Ref Range Status  05/10/2021 17 0 - 32 IU/L Final          Passed - Patient is not pregnant      Passed - Valid encounter within last 12 months    Recent Outpatient Visits           11 months ago Hypothyroidism due to acquired atrophy of thyroid   [place], Charlane Ferretti, MD   1 year ago Acquired hypothyroidism   [place], Enobong, MD   1 year ago Recurrent herpes labialis   [place], MD   2 years ago Prediabetes   [place], Enobong, MD   9 years ago Foot pain   [facility], Janalee Dane, MD               SYNTHROID 88 MCG tablet [Pharmacy Med Name: SYNTHROID 27 MCG TABLET] 90 tablet 0  Sig: TAKE 1 TABLET BY MOUTH EVERY DAY BEFORE BREAKFAST     Endocrinology:  Hypothyroid Agents Passed - 07/08/2021  9:04 AM      Passed - TSH in normal range and within 360 days    TSH  Date Value Ref Range Status  05/10/2021 2.390 0.450 - 4.500 uIU/mL Final          Passed - Valid encounter within last 12 months    Recent Outpatient Visits           11 months ago Hypothyroidism due to acquired atrophy of thyroid   [place], Enobong, MD   1 year ago Acquired hypothyroidism   [place], Enobong, MD   1 year ago Recurrent herpes labialis   [place], MD   2 years ago Prediabetes   [place], Enobong, MD   9 years ago Foot pain   [facility], Janalee Dane, MD

## 2021-07-10 DIAGNOSIS — I10 Essential (primary) hypertension: Secondary | ICD-10-CM | POA: Diagnosis not present

## 2021-07-10 DIAGNOSIS — E039 Hypothyroidism, unspecified: Secondary | ICD-10-CM | POA: Diagnosis not present

## 2021-07-10 DIAGNOSIS — R69 Illness, unspecified: Secondary | ICD-10-CM | POA: Diagnosis not present

## 2021-07-10 DIAGNOSIS — R209 Unspecified disturbances of skin sensation: Secondary | ICD-10-CM | POA: Diagnosis not present

## 2021-07-10 DIAGNOSIS — E7800 Pure hypercholesterolemia, unspecified: Secondary | ICD-10-CM | POA: Diagnosis not present

## 2021-07-11 DIAGNOSIS — R209 Unspecified disturbances of skin sensation: Secondary | ICD-10-CM | POA: Diagnosis not present

## 2021-07-11 DIAGNOSIS — R200 Anesthesia of skin: Secondary | ICD-10-CM | POA: Diagnosis not present

## 2021-07-12 DIAGNOSIS — R200 Anesthesia of skin: Secondary | ICD-10-CM | POA: Diagnosis not present

## 2021-07-16 DIAGNOSIS — F4310 Post-traumatic stress disorder, unspecified: Secondary | ICD-10-CM | POA: Diagnosis not present

## 2021-07-16 DIAGNOSIS — R69 Illness, unspecified: Secondary | ICD-10-CM | POA: Diagnosis not present

## 2021-07-23 DIAGNOSIS — R69 Illness, unspecified: Secondary | ICD-10-CM | POA: Diagnosis not present

## 2021-07-23 DIAGNOSIS — F4310 Post-traumatic stress disorder, unspecified: Secondary | ICD-10-CM | POA: Diagnosis not present

## 2021-08-01 ENCOUNTER — Other Ambulatory Visit: Payer: Self-pay | Admitting: Family Medicine

## 2021-08-01 DIAGNOSIS — K589 Irritable bowel syndrome without diarrhea: Secondary | ICD-10-CM | POA: Diagnosis not present

## 2021-08-01 DIAGNOSIS — K909 Intestinal malabsorption, unspecified: Secondary | ICD-10-CM | POA: Diagnosis not present

## 2021-08-01 DIAGNOSIS — K648 Other hemorrhoids: Secondary | ICD-10-CM | POA: Diagnosis not present

## 2021-08-01 DIAGNOSIS — I10 Essential (primary) hypertension: Secondary | ICD-10-CM

## 2021-08-02 NOTE — Telephone Encounter (Signed)
I called to see if she was still a pt with [facility] as it looks like she has established with a new PCP with [facility] on 07/10/2021.  Left a voicemail to return call and let us know if she was still going to be a pt with [facility] or not.    Was a No Show for appt with Dr. Margarita Rana on 06/26/2021. ?Refill request for metoprolol 25 mg returned to [facility]. ?

## 2021-08-06 DIAGNOSIS — R69 Illness, unspecified: Secondary | ICD-10-CM | POA: Diagnosis not present

## 2021-08-06 DIAGNOSIS — F4310 Post-traumatic stress disorder, unspecified: Secondary | ICD-10-CM | POA: Diagnosis not present

## 2021-08-08 DIAGNOSIS — R209 Unspecified disturbances of skin sensation: Secondary | ICD-10-CM | POA: Diagnosis not present

## 2021-08-08 DIAGNOSIS — M6289 Other specified disorders of muscle: Secondary | ICD-10-CM | POA: Diagnosis not present

## 2021-08-09 DIAGNOSIS — R209 Unspecified disturbances of skin sensation: Secondary | ICD-10-CM | POA: Diagnosis not present

## 2021-08-09 DIAGNOSIS — R200 Anesthesia of skin: Secondary | ICD-10-CM | POA: Diagnosis not present

## 2021-08-10 ENCOUNTER — Encounter: Payer: Self-pay | Admitting: Obstetrics and Gynecology

## 2021-08-11 DIAGNOSIS — R209 Unspecified disturbances of skin sensation: Secondary | ICD-10-CM | POA: Diagnosis not present

## 2021-08-11 DIAGNOSIS — R200 Anesthesia of skin: Secondary | ICD-10-CM | POA: Diagnosis not present

## 2021-08-11 DIAGNOSIS — R760 Raised antibody titer: Secondary | ICD-10-CM | POA: Diagnosis not present

## 2021-08-13 DIAGNOSIS — R69 Illness, unspecified: Secondary | ICD-10-CM | POA: Diagnosis not present

## 2021-08-13 DIAGNOSIS — F4310 Post-traumatic stress disorder, unspecified: Secondary | ICD-10-CM | POA: Diagnosis not present

## 2021-08-15 DIAGNOSIS — R233 Spontaneous ecchymoses: Secondary | ICD-10-CM | POA: Diagnosis not present

## 2021-08-15 DIAGNOSIS — R300 Dysuria: Secondary | ICD-10-CM | POA: Diagnosis not present

## 2021-08-15 DIAGNOSIS — E119 Type 2 diabetes mellitus without complications: Secondary | ICD-10-CM | POA: Diagnosis not present

## 2021-09-04 ENCOUNTER — Encounter: Payer: Self-pay | Admitting: Obstetrics and Gynecology

## 2021-09-04 DIAGNOSIS — N952 Postmenopausal atrophic vaginitis: Secondary | ICD-10-CM

## 2021-09-04 MED ORDER — ESTRACE 0.1 MG/GM VA CREA: TOPICAL_CREAM | VAGINAL | 0 refills | Status: AC

## 2021-09-04 NOTE — Telephone Encounter (Signed)
Last AEX 04/25/21 ?Negative Mammo 09/07/20 ? ?Pharmacy will let us know if PA required when they process the Rx.  Pharmacy is set. ?

## 2021-09-10 ENCOUNTER — Ambulatory Visit: Payer: Medicare HMO

## 2021-12-26 ENCOUNTER — Encounter (INDEPENDENT_AMBULATORY_CARE_PROVIDER_SITE_OTHER): Payer: Self-pay

## 2022-11-16 ENCOUNTER — Emergency Department (HOSPITAL_BASED_OUTPATIENT_CLINIC_OR_DEPARTMENT_OTHER): Payer: Medicare HMO

## 2022-11-16 ENCOUNTER — Emergency Department (HOSPITAL_BASED_OUTPATIENT_CLINIC_OR_DEPARTMENT_OTHER)
Admission: EM | Admit: 2022-11-16 | Discharge: 2022-11-16 | Disposition: A | Discharge status: Home / Self Care | Payer: Medicare HMO | Attending: Emergency Medicine | Admitting: Emergency Medicine

## 2022-11-16 ENCOUNTER — Other Ambulatory Visit: Payer: Self-pay

## 2022-11-16 DIAGNOSIS — Z7982 Long term (current) use of aspirin: Secondary | ICD-10-CM | POA: Diagnosis not present

## 2022-11-16 DIAGNOSIS — S0990XA Unspecified injury of head, initial encounter: Secondary | ICD-10-CM | POA: Diagnosis present

## 2022-11-16 DIAGNOSIS — S060X1A Concussion with loss of consciousness of 30 minutes or less, initial encounter: Secondary | ICD-10-CM | POA: Insufficient documentation

## 2022-11-16 DIAGNOSIS — S161XXA Strain of muscle, fascia and tendon at neck level, initial encounter: Secondary | ICD-10-CM | POA: Diagnosis not present

## 2022-11-16 DIAGNOSIS — W228XXA Striking against or struck by other objects, initial encounter: Secondary | ICD-10-CM | POA: Insufficient documentation

## 2022-11-16 NOTE — ED Provider Notes (Signed)
[place] [facility] AT [facility] Provider Note   CSN: [id number] Arrival date & time: 11/16/22  0900     History  Chief Complaint  Patient presents with   Head Injury    Andrea [name] is a 47 y.o. female.  Patient presents to the emergency department for evaluation of concussion symptoms and head injury.  She states that she was on vacation and struck her head on a low ceiling on 6/23.  She has a history of chronic pain and dizziness due to an autoimmune condition.  She has had recurrent headaches, dizziness, fatigue since the injury.  Patient states that she saw her pain management specialist yesterday, who she sees for back pain, and it was recommended that she follow-up for a CT of her head to ensure no more severe injury than a concussion.  She has had some sleep difficulties.  She has had some neck pain as well, worse with movement.  No new weakness, numbness or tingling in her arms or her legs.  No new urinary symptoms.  No anticoagulation use.       Home Medications Prior to Admission medications   Medication Sig Start Date End Date Taking? Authorizing Provider  ALPRAZolam (XANAX XR) 2 MG 24 hr tablet Take 1 tablet (2 mg total) by mouth daily. For anxiety 07/05/18   Armandina Stammer I, NP  ALPRAZolam Prudy Feeler) 1 MG tablet Take 1 mg by mouth at bedtime as needed for anxiety.    [provider]  ARIPiprazole (ABILIFY) 10 MG tablet 1 tablet    [provider]  aspirin EC 81 MG tablet Take 81 mg by mouth daily.    [provider]  colestipol (COLESTID) 1 g tablet Take two tablets by mouth twice a day 06/07/19   Hilarie Fredrickson, MD  cycloSPORINE (RESTASIS) 0.05 % ophthalmic emulsion Place 1 drop into both eyes 2 (two) times daily.    [provider]  doxepin (SINEQUAN) 10 MG capsule Take 10 mg by mouth at bedtime. 10/13/19   [provider]  ESTRACE VAGINAL 0.1 MG/GM vaginal cream PLACE 1 GRAM VAGINALLY 2 X A WEEK AT  BEDTIME 09/04/21   Romualdo Bolk, MD  ezetimibe (ZETIA) 10 MG tablet TAKE 1 TABLET BY MOUTH EVERY DAY 07/25/20   Hoy Register, MD  FLUoxetine (PROZAC) 40 MG capsule Take 1 capsule (40 mg total) by mouth daily. Patient taking differently: Take 40 mg by mouth 2 (two) times daily. 11/10/18   Aldean Baker, NP  fluticasone (FLONASE) 50 MCG/ACT nasal spray Place 2 sprays into both nostrils daily. 02/01/19   Helane Rima, DO  gabapentin (NEURONTIN) 100 MG capsule TAKE 1 CAPSULE EVERY MORNING TAKE IN ADDITION TO THE AT BEDTIME DOSE 04/25/21   Romualdo Bolk, MD  gabapentin (NEURONTIN) 300 MG capsule TAKE 1 CAPSULE BY MOUTH EVERYDAY AT BEDTIME 04/25/21   Romualdo Bolk, MD  metoprolol tartrate (LOPRESSOR) 25 MG tablet TAKE 1 TABLET BY MOUTH TWICE A DAY 05/06/21   Newlin, Enobong, MD  ondansetron (ZOFRAN) 4 MG tablet TAKE 1 TABLET BY MOUTH EVERY 8 HOURS AS NEEDED FOR NAUSEA AND VOMITING 05/16/20   Hoy Register, MD  oxyCODONE-acetaminophen (PERCOCET) 7.5-325 MG tablet Take 1 tablet by mouth 4 (four) times daily as needed. 11/24/19   [provider]  oxyCODONE-acetaminophen (PERCOCET/ROXICET) 5-325 MG tablet Take 1 tablet by mouth every 6 (six) hours as needed for severe pain. 02/01/19   Helane Rima, DO  prazosin (MINIPRESS) 2  MG capsule Take by mouth 2 (two) times daily.  09/16/19   [provider]  QUEtiapine (SEROQUEL) 400 MG tablet Take 400 mg by mouth at bedtime. 03/07/20   [provider]  rosuvastatin (CRESTOR) 20 MG tablet TAKE 1 TABLET BY MOUTH EVERY DAY 12/08/20   Hoy Register, MD  sulfamethoxazole-trimethoprim (BACTRIM DS) 800-160 MG tablet Take 1 tablet by mouth 2 (two) times daily. One PO BID x 3 days 04/21/20   Romualdo Bolk, MD  SYNTHROID 88 MCG tablet TAKE 1 TABLET BY MOUTH DAILY BEFORE BREAKFAST. 04/09/21   Hoy Register, MD  traZODone (DESYREL) 100 MG tablet 300 mg. 11/08/19   [provider]  valACYclovir (VALTREX) 1000 MG  tablet Take 2 tablets po q 12 hours x 2 doses as needed. 04/19/20   Romualdo Bolk, MD      Allergies    Quetiapine    Review of Systems   Review of Systems  Physical Exam Updated Vital Signs BP 114/78 (BP Location: Right Arm)   Pulse 77   Temp 98.2 F (36.8 C) (Oral)   Resp 18   LMP 03/19/2011   SpO2 99%   Physical Exam Vitals and nursing note reviewed.  Constitutional:      Appearance: She is well-developed.  HENT:     Head: Normocephalic and atraumatic. No raccoon eyes or Battle's sign.     Right Ear: Tympanic membrane, ear canal and external ear normal. No hemotympanum.     Left Ear: Tympanic membrane, ear canal and external ear normal. No hemotympanum.     Nose: Nose normal.     Mouth/Throat:     Pharynx: Uvula midline.  Eyes:     General: Lids are normal.     Extraocular Movements:     Right eye: No nystagmus.     Left eye: No nystagmus.     Conjunctiva/sclera: Conjunctivae normal.     Pupils: Pupils are equal, round, and reactive to light.     Comments: No visible hyphema noted  Cardiovascular:     Rate and Rhythm: Normal rate and regular rhythm.  Pulmonary:     Effort: Pulmonary effort is normal.     Breath sounds: Normal breath sounds.  Abdominal:     Palpations: Abdomen is soft.     Tenderness: There is no abdominal tenderness.  Musculoskeletal:     Cervical back: Neck supple. Tenderness (Paraspinous) present. No bony tenderness. Normal range of motion.     Thoracic back: No tenderness or bony tenderness.     Lumbar back: No tenderness or bony tenderness.  Skin:    General: Skin is warm and dry.  Neurological:     Mental Status: She is alert and oriented to person, place, and time.     GCS: GCS eye subscore is 4. GCS verbal subscore is 5. GCS motor subscore is 6.     Cranial Nerves: No cranial nerve deficit.     Sensory: No sensory deficit.     Coordination: Coordination normal.     ED Results / Procedures / Treatments   Labs (all labs  ordered are listed, but only abnormal results are displayed) Labs Reviewed - No data to display  EKG None  Radiology CT Cervical Spine Wo Contrast  Result Date: 11/16/2022 CLINICAL DATA:  47 year old female status post blunt head trauma with brief loss of consciousness. Dizziness and confusion. EXAM: CT CERVICAL SPINE WITHOUT CONTRAST TECHNIQUE: Multidetector CT imaging of the cervical spine was performed without intravenous contrast.  Multiplanar CT image reconstructions were also generated. RADIATION DOSE REDUCTION: This exam was performed according to the departmental dose-optimization program which includes automated exposure control, adjustment of the mA and/or kV according to patient size and/or use of iterative reconstruction technique. COMPARISON:  Head CT today.  Cervical spine CT 11/10/2005. FINDINGS: Alignment: Chronic straightening of cervical lordosis. Cervicothoracic junction alignment is within normal limits. Bilateral posterior element alignment is within normal limits. Skull base and vertebrae: Visualized skull base is intact. No atlanto-occipital dissociation. Congenital incomplete ossification of the posterior C1 ring. C1 and C2 appear intact and aligned. No acute osseous abnormality identified. Soft tissues and spinal canal: No prevertebral fluid or swelling. No visible canal hematoma. Negative visible noncontrast neck soft tissues. Disc levels: Mild cervical disc and endplate degeneration, progression since 11-25-05 most pronounced anteriorly at C6-C7. No significant cervical spinal stenosis by CT. Upper chest: Negative. IMPRESSION: 1. No acute traumatic injury identified in the cervical spine. 2. Mild cervical spine degeneration. Electronically Signed   By: Odessa Fleming M.D.   On: 11/16/2022 10:53   CT Head Wo Contrast  Result Date: 11/16/2022 CLINICAL DATA:  47 year old female status post blunt head trauma with brief loss of consciousness. Dizziness and confusion. EXAM: CT HEAD WITHOUT  CONTRAST TECHNIQUE: Contiguous axial images were obtained from the base of the skull through the vertex without intravenous contrast. RADIATION DOSE REDUCTION: This exam was performed according to the departmental dose-optimization program which includes automated exposure control, adjustment of the mA and/or kV according to patient size and/or use of iterative reconstruction technique. COMPARISON:  Head CT 11/19/2012. FINDINGS: Brain: Normal cerebral volume. No midline shift, ventriculomegaly, mass effect, evidence of mass lesion, intracranial hemorrhage or evidence of cortically based acute infarction. Gray-white matter differentiation is within normal limits throughout the brain. Vascular: No suspicious intracranial vascular hyperdensity. Skull: Stable.  No fracture identified. Sinuses/Orbits: Visualized paranasal sinuses and mastoids are clear. Other: No discrete orbit or scalp soft tissue injury. IMPRESSION: No acute traumatic injury identified.  Normal noncontrast Head CT. Electronically Signed   By: Odessa Fleming M.D.   On: 11/16/2022 10:50    Procedures Procedures    Medications Ordered in ED Medications - No data to display  ED Course/ Medical Decision Making/ A&P    Patient seen and examined. History obtained directly from patient.  She showed me pictures of the left ceiling that she struck her head on.  Labs/EKG: None ordered  Imaging: Ordered CT head and cervical spine given ongoing headache and concussion type symptoms after head injury occurring 6 days ago.  Medications/Fluids: None ordered  Most recent vital signs reviewed and are as follows: BP 114/78 (BP Location: Right Arm)   Pulse 77   Temp 98.2 F (36.8 C) (Oral)   Resp 18   LMP 03/19/2011   SpO2 99%   Initial impression: Head injury, concussion symptoms, cervical strain  11:31 AM Reassessment performed. Patient appears stable.  Imaging personally visualized and interpreted including: CT head and cervical spine, agree  negative.  Reviewed pertinent lab work and imaging with patient at bedside. Questions answered.   Most current vital signs reviewed and are as follows: BP 114/78 (BP Location: Right Arm)   Pulse 77   Temp 98.2 F (36.8 C) (Oral)   Resp 18   LMP 03/19/2011   SpO2 99%   Plan: Discharge to home.   Prescriptions written for: None  Other home care instructions discussed: Discussed concussion precautions, graded return to activity  ED return instructions discussed: Worsening  severe headache, vomiting, new symptoms or other concerns  Follow-up instructions discussed: Patient encouraged to follow-up with their PCP/concussion clinic referral in 7 days if not improving.                             Medical Decision Making Amount and/or Complexity of Data Reviewed Radiology: ordered.   Patient presents after head injury occurring 6 days ago.  CT done due to ongoing concussion symptoms to rule out subdural hematoma.  This was negative.  Also neck pain, likely cervical strain.  No fracture noted.  Will continue conservative management.  Patient counseled on concussion precautions symptoms and need to follow-up as needed.        Final Clinical Impression(s) / ED Diagnoses Final diagnoses:  Concussion with loss of consciousness of 30 minutes or less, initial encounter  Strain of neck muscle, initial encounter    Rx / DC Orders ED Discharge Orders     None         Renne Crigler, PA-C 11/16/22 1135    Horton, Clabe Seal, DO 11/17/22 [phone]

## 2022-11-16 NOTE — ED Triage Notes (Signed)
Pt states she hit her head on the ceiling. Pt states she was knocked out and when she got up and was very sleepy. Went to her primary who was concerned for a concussion and wanted her to have a CT. Pt states she is having dizziness and confusion.

## 2022-11-16 NOTE — ED Notes (Signed)
ED Provider at bedside. 

## 2022-11-16 NOTE — Discharge Instructions (Signed)
Please read and follow all provided instructions.  Your diagnoses today include:  1. Concussion with loss of consciousness of 30 minutes or less, initial encounter   2. Strain of neck muscle, initial encounter     Tests performed today include: CT scan of your head and cervical spine that did not show any serious injury. Vital signs. See below for your results today.   Medications prescribed:  None  Take any prescribed medications only as directed.  Home care instructions:  Follow any educational materials contained in this packet.  BE VERY CAREFUL not to take multiple medicines containing Tylenol (also called acetaminophen). Doing so can lead to an overdose which can damage your liver and cause liver failure and possibly death.   Follow-up instructions: Please follow-up with your primary care provider or the concussion clinic listed in the next 7 days for further evaluation of your symptoms, if not improving.   Return instructions:  SEEK IMMEDIATE MEDICAL ATTENTION IF: There is confusion or drowsiness (although children frequently become drowsy after injury).  You cannot awaken the injured person.  You have more than one episode of vomiting.  You notice dizziness or unsteadiness which is getting worse, or inability to walk.  You have convulsions or unconsciousness.  You experience severe, persistent headaches not relieved by Tylenol. You cannot use arms or legs normally.  There are changes in pupil sizes. (This is the black center in the colored part of the eye)  There is clear or bloody discharge from the nose or ears.  You have change in speech, vision, swallowing, or understanding.  Localized weakness, numbness, tingling, or change in bowel or bladder control. You have any other emergent concerns.  Additional Information: You have had a head injury which does not appear to require admission at this time.  Your vital signs today were: BP 114/78 (BP Location: Right Arm)    Pulse 77   Temp 98.2 F (36.8 C) (Oral)   Resp 18   LMP 03/19/2011   SpO2 99%  If your blood pressure (BP) was elevated above 135/85 this visit, please have this repeated by your doctor within one month. --------------
